# Patient Record
Sex: Male | Born: 1950 | Race: White | Hispanic: No | Smoking: Former smoker
Health system: Southern US, Community
[De-identification: ages and names within clinical notes are randomized; demographics above are authoritative.]

## PROBLEM LIST (undated history)

## (undated) DIAGNOSIS — R519 Headache, unspecified: Secondary | ICD-10-CM

## (undated) DIAGNOSIS — N400 Enlarged prostate without lower urinary tract symptoms: Secondary | ICD-10-CM

## (undated) DIAGNOSIS — K635 Polyp of colon: Secondary | ICD-10-CM

## (undated) DIAGNOSIS — J189 Pneumonia, unspecified organism: Secondary | ICD-10-CM

## (undated) DIAGNOSIS — I6381 Other cerebral infarction due to occlusion or stenosis of small artery: Secondary | ICD-10-CM

## (undated) DIAGNOSIS — G8929 Other chronic pain: Secondary | ICD-10-CM

## (undated) DIAGNOSIS — K439 Ventral hernia without obstruction or gangrene: Secondary | ICD-10-CM

## (undated) DIAGNOSIS — F0390 Unspecified dementia without behavioral disturbance: Secondary | ICD-10-CM

## (undated) DIAGNOSIS — R51 Headache: Secondary | ICD-10-CM

## (undated) DIAGNOSIS — M1990 Unspecified osteoarthritis, unspecified site: Secondary | ICD-10-CM

## (undated) DIAGNOSIS — E039 Hypothyroidism, unspecified: Secondary | ICD-10-CM

## (undated) DIAGNOSIS — K4090 Unilateral inguinal hernia, without obstruction or gangrene, not specified as recurrent: Secondary | ICD-10-CM

## (undated) DIAGNOSIS — G4730 Sleep apnea, unspecified: Secondary | ICD-10-CM

## (undated) DIAGNOSIS — K649 Unspecified hemorrhoids: Secondary | ICD-10-CM

## (undated) DIAGNOSIS — K56609 Unspecified intestinal obstruction, unspecified as to partial versus complete obstruction: Secondary | ICD-10-CM

## (undated) DIAGNOSIS — E119 Type 2 diabetes mellitus without complications: Secondary | ICD-10-CM

## (undated) DIAGNOSIS — K5790 Diverticulosis of intestine, part unspecified, without perforation or abscess without bleeding: Secondary | ICD-10-CM

## (undated) DIAGNOSIS — K529 Noninfective gastroenteritis and colitis, unspecified: Secondary | ICD-10-CM

## (undated) DIAGNOSIS — K5090 Crohn's disease, unspecified, without complications: Secondary | ICD-10-CM

## (undated) DIAGNOSIS — I10 Essential (primary) hypertension: Secondary | ICD-10-CM

## (undated) DIAGNOSIS — D649 Anemia, unspecified: Secondary | ICD-10-CM

## (undated) DIAGNOSIS — K8020 Calculus of gallbladder without cholecystitis without obstruction: Secondary | ICD-10-CM

## (undated) DIAGNOSIS — E669 Obesity, unspecified: Secondary | ICD-10-CM

## (undated) DIAGNOSIS — G4700 Insomnia, unspecified: Secondary | ICD-10-CM

## (undated) HISTORY — PX: OTHER SURGICAL HISTORY: SHX169

## (undated) HISTORY — PX: CHOLECYSTECTOMY: SHX55

## (undated) HISTORY — DX: Insomnia, unspecified: G47.00

## (undated) HISTORY — DX: Essential (primary) hypertension: I10

## (undated) HISTORY — DX: Headache: R51

## (undated) HISTORY — DX: Headache, unspecified: R51.9

## (undated) HISTORY — DX: Type 2 diabetes mellitus without complications: E11.9

## (undated) HISTORY — DX: Pneumonia, unspecified organism: J18.9

## (undated) HISTORY — DX: Obesity, unspecified: E66.9

## (undated) HISTORY — DX: Diverticulosis of intestine, part unspecified, without perforation or abscess without bleeding: K57.90

## (undated) HISTORY — DX: Anemia, unspecified: D64.9

## (undated) HISTORY — DX: Benign prostatic hyperplasia without lower urinary tract symptoms: N40.0

## (undated) HISTORY — DX: Hypothyroidism, unspecified: E03.9

## (undated) HISTORY — DX: Unilateral inguinal hernia, without obstruction or gangrene, not specified as recurrent: K40.90

## (undated) HISTORY — DX: Unspecified osteoarthritis, unspecified site: M19.90

## (undated) HISTORY — PX: NASAL SINUS SURGERY: SHX719

## (undated) HISTORY — DX: Unspecified hemorrhoids: K64.9

## (undated) HISTORY — DX: Other chronic pain: G89.29

## (undated) HISTORY — DX: Crohn's disease, unspecified, without complications: K50.90

## (undated) HISTORY — DX: Calculus of gallbladder without cholecystitis without obstruction: K80.20

## (undated) HISTORY — DX: Sleep apnea, unspecified: G47.30

## (undated) HISTORY — DX: Ventral hernia without obstruction or gangrene: K43.9

## (undated) HISTORY — DX: Other cerebral infarction due to occlusion or stenosis of small artery: I63.81

## (undated) HISTORY — DX: Noninfective gastroenteritis and colitis, unspecified: K52.9

## (undated) HISTORY — DX: Unspecified intestinal obstruction, unspecified as to partial versus complete obstruction: K56.609

## (undated) HISTORY — PX: TONSILLECTOMY AND ADENOIDECTOMY: SUR1326

## (undated) HISTORY — DX: Polyp of colon: K63.5

---

## 2006-05-13 ENCOUNTER — Ambulatory Visit: Payer: Self-pay | Admitting: Cardiology

## 2006-05-22 ENCOUNTER — Ambulatory Visit: Payer: Self-pay | Admitting: Cardiology

## 2006-07-10 ENCOUNTER — Ambulatory Visit (HOSPITAL_COMMUNITY): Admission: RE | Admit: 2006-07-10 | Discharge: 2006-07-11 | Payer: Self-pay | Admitting: Otolaryngology

## 2006-10-29 ENCOUNTER — Ambulatory Visit (HOSPITAL_BASED_OUTPATIENT_CLINIC_OR_DEPARTMENT_OTHER): Admission: RE | Admit: 2006-10-29 | Discharge: 2006-10-29 | Payer: Self-pay | Admitting: Otolaryngology

## 2006-11-04 ENCOUNTER — Ambulatory Visit: Payer: Self-pay | Admitting: Internal Medicine

## 2008-05-14 ENCOUNTER — Ambulatory Visit: Payer: Self-pay | Admitting: Cardiology

## 2010-04-10 ENCOUNTER — Ambulatory Visit (INDEPENDENT_AMBULATORY_CARE_PROVIDER_SITE_OTHER): Payer: BC Managed Care – PPO | Admitting: Internal Medicine

## 2010-04-10 DIAGNOSIS — K5900 Constipation, unspecified: Secondary | ICD-10-CM

## 2010-04-10 DIAGNOSIS — K5090 Crohn's disease, unspecified, without complications: Secondary | ICD-10-CM

## 2010-05-22 ENCOUNTER — Ambulatory Visit (INDEPENDENT_AMBULATORY_CARE_PROVIDER_SITE_OTHER): Payer: PRIVATE HEALTH INSURANCE | Admitting: Internal Medicine

## 2010-05-22 DIAGNOSIS — K5090 Crohn's disease, unspecified, without complications: Secondary | ICD-10-CM

## 2010-05-22 DIAGNOSIS — Z8711 Personal history of peptic ulcer disease: Secondary | ICD-10-CM

## 2010-05-23 ENCOUNTER — Ambulatory Visit (INDEPENDENT_AMBULATORY_CARE_PROVIDER_SITE_OTHER): Payer: PRIVATE HEALTH INSURANCE | Admitting: Internal Medicine
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
## 2010-07-18 NOTE — Procedures (Signed)
NAME:  Bruce [name], Bruce [name] NO.:  [id number]   MEDICAL RECORD NO.:  [id number]          PATIENT TYPE:  OUT   LOCATION:  SLEEP CENTER                 FACILITY:  [facility]   PHYSICIAN:  Clinton D. Annamaria Boots, MD, FCCP, [place] OF BIRTH:  22-Jun-1950   DATE OF STUDY:  10/29/2006                            NOCTURNAL POLYSOMNOGRAM   REFERRING PHYSICIAN:   REFERRING PHYSICIAN:  Shanon Brow L. Wilburn Cornelia, M.D.   INDICATION FOR STUDY:  Hypersomnia with sleep apnea.   EPWORTH SLEEPINESS SCORE:  16/24.  BMI 31.  Weight 230 pounds.   HOME MEDICATIONS:  Listed and reviewed.   SLEEP ARCHITECTURE:  Total sleep time 395 with sleep efficiency 92%.  Stage I 5%.  Stage II 90%.  Stage III absent.  REM 6% of total sleep  time.  Sleep latency 13 minutes.  REM latency 105 minutes.  Awake after  sleep onset 21 minutes.  Arousal index 10.6.  Zolpidem and alprazolam  were both taken at 9:45 p.m.   RESPIRATORY DATA:  Split study protocol.  Apnea-hypopnea index (AHI/RDI)  36.8 obstructive events per hour, indicating moderate obstructive sleep  apnea/hypopnea syndrome before CPAP.  This reflected 85 events, all  hypopneas, before CPAP.  The events were noted especially while supine  (AHI 23.4) but were also present in other sleep positions.  REM AHI 16  per hour.  CPAP was titrated to 11 CWP, AIH 0 per hour.  A large ResMed  Quattro full face mask was chosen with heated humidifier.   OXYGEN DATA:  Moderate snoring with oxygen desaturation nadir 84% before  CPAP.  After CPAP control, saturation held 93-96% on room air.   CARDIAC DATA:  Sinus rhythm with PVCs.   MOVEMENT-PARASOMNIA:  A total of 76 limb jerks were recorded, of which 8  were associated with arousal or awakening for a periodic limb movement  with arousal index of 1.2 per hour, which is probably not significant.   IMPRESSIONS-RECOMMENDATIONS:  1. Adequate sleep time with diminished percentage time in REM after      taking both Zolpidem and  alprazolam at bedtime.  2. Moderate obstructive sleep apnea/hypopnea syndrome.  AHI 36 per      hour with events more common supine but not limited to supine      position.  Moderate snoring with oxygen desaturation nadir 84%.  3. Successful CPAP titration to 11 CWP, AIH 0 per hour.  A large      ResMed Quattro full face mask was chosen with heated humidifier.  4. Patient describes events of sudden waking with choking at home.      Differential may include reflux events with this description.      Clinton D. Annamaria Boots, MD, [facility], FACP  Diplomate, Tax adviser of Sleep Medicine  Electronically Signed     CDY/MEDQ  D:  11/03/2006 12:14:47  T:  11/03/2006 16:10:20  Job:  [id number]
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
## 2010-12-25 ENCOUNTER — Ambulatory Visit (INDEPENDENT_AMBULATORY_CARE_PROVIDER_SITE_OTHER): Payer: BC Managed Care – PPO | Admitting: Internal Medicine

## 2010-12-25 ENCOUNTER — Encounter (INDEPENDENT_AMBULATORY_CARE_PROVIDER_SITE_OTHER): Payer: Self-pay | Admitting: *Deleted

## 2010-12-25 ENCOUNTER — Telehealth (INDEPENDENT_AMBULATORY_CARE_PROVIDER_SITE_OTHER): Payer: Self-pay | Admitting: *Deleted

## 2010-12-25 ENCOUNTER — Other Ambulatory Visit (INDEPENDENT_AMBULATORY_CARE_PROVIDER_SITE_OTHER): Payer: Self-pay | Admitting: *Deleted

## 2010-12-25 ENCOUNTER — Encounter (INDEPENDENT_AMBULATORY_CARE_PROVIDER_SITE_OTHER): Payer: Self-pay | Admitting: Internal Medicine

## 2010-12-25 VITALS — BP 116/70 | HR 78 | Temp 98.1°F | Resp 16 | Ht 72.0 in | Wt 266.0 lb

## 2010-12-25 DIAGNOSIS — K921 Melena: Secondary | ICD-10-CM

## 2010-12-25 DIAGNOSIS — K5090 Crohn's disease, unspecified, without complications: Secondary | ICD-10-CM

## 2010-12-25 DIAGNOSIS — K5900 Constipation, unspecified: Secondary | ICD-10-CM

## 2010-12-25 DIAGNOSIS — K625 Hemorrhage of anus and rectum: Secondary | ICD-10-CM

## 2010-12-25 MED ORDER — PEG-KCL-NACL-NASULF-NA ASC-C 100 G PO SOLR: 1.0000 | Freq: Once | ORAL | Status: DC

## 2010-12-25 MED ORDER — POLYETHYLENE GLYCOL 3350 17 GM/SCOOP PO POWD: 17.0000 g | Freq: Every day | ORAL | Status: AC

## 2010-12-25 NOTE — Telephone Encounter (Signed)
Patient needs movi prep 

## 2010-12-25 NOTE — Patient Instructions (Signed)
Continue with high fiber diet and fiber pills as before. Take MiraLAX half to one scoop daily can titrate dose to desired result. Need to become more active and start walking daily. Colonoscopy to be scheduled.
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
## 2010-12-26 NOTE — Consult Note (Signed)
NAME:  Bruce [name] NO.:  [id number]  MEDICAL RECORD NO.:  [id number]  LOCATION:                                 FACILITY:  PHYSICIAN:  Hildred Laser, M.D.    DATE OF BIRTH:  Oct 30, 1950  DATE OF CONSULTATION: DATE OF DISCHARGE:                                CONSULTATION   PRESENTING COMPLAINT:  Rectal bleeding.  Patient with history of small bowel Crohn disease.  HISTORY OF PRESENT ILLNESS:  Bruce [name] is 60 year old Caucasian male patient of Dr. Woody Seller who is here for scheduled visit.  He was last seen in February 2012, and was complaining of abdominal pain and constipation and was begun on MiraLax.  He stopped MiraLax after a while.  The patient is accompanied by his wife, Bruce [name].  The patient states he has been seeing blood with his bowel movements for about 3 months.  He sees it almost every time he has a bowel movement.  His bowels move every 3-4 days and he is constipated, although he is not taking anything at the present time.  He denies abdominal pain.  He has a good appetite. He has gained 8 pounds since his last visit.  He does not do any physical activity.  He does complain of left ankle pain.  He says amount of blood that he passes is bright red and small-to-moderate amount.  Review of the systems is negative for heartburn, dysphagia, nausea, vomiting, fever, or chills.  CURRENT MEDICATIONS: 1. Alprazolam 0.5 mg b.i.d. and 1 mg at bedtime. 2. Aspirin 81 mg p.o. daily. 3. Avodart 0.5 mg p.o. daily. 4. Fiber capsules 2 twice daily. 5. Levothyroxine 175 mcg p.o. daily. 6. Lisinopril 10 mg p.o. daily. 7. Pentasa 1 g p.o. q.i.d. 8. Zolpidem 10 mg p.o. at bedtime.  PAST MEDICAL HISTORY: 1. Hypertension. 2. Hypothyroidism. 3. Benign prostatic hypertrophy. 4. Chronic insomnia. 5. Sleep apnea, but he could not tolerate CPAP. 6. History of Crohn disease, diagnosed in September 2009 when he had a     colonoscopy.  He had terminal ileitis with a  noncritical stricture.     Biopsy was consistent with his diagnosis.  He had 2 diverticula at     sigmoid colon, but no evidence of colonic disease. He also had small adenoma removed. 7. Obesity.  He has gained 26 pounds in the last 3 years.  He had injury to his face at age 26 and he had surgery on his right eye to help with closure of eyelids.  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  He was admitted to [facility] in September with chest pain and MI was ruled out.  He apparently had ileus or gastritis.  In August of this year, he was seen in emergency room with bilateral flank pain and unenhanced abdominopelvic CT was negative.  In February of this year, he was admitted to [facility] with small bowel obstruction felt to be secondary to adhesions and responded to conservative therapy.  ALLERGIES:  NK.  FAMILY HISTORY:  Mother is 39 year old and doing well.  He has been treated for colon carcinoma and remains in remission.  Sister had surgery for colon carcinoma  when she was in her early 14s and now she is 51 and in remission.  Two other sisters are in good health.  Father died of brain cancer at age 40.  SOCIAL HISTORY:  He is married.  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  He smoked cigarettes less than a pack a day for 40 years, but quit 6 years ago.  He does not drink alcohol.  He has a son in good health.  His one daughter has ileocolonic Crohn disease and other daughter died of auto accident at age 34.  OBJECTIVE:  VITAL SIGNS:  Weight 266 pounds, he is 72 inches tall, pulse 78 per minute, blood pressure 116/70, respirations 16, and temp is 98.1. EYES:  He has deformity to his right eyelid from previous surgeries closed laterally.  He has right facial weakness.   Conjunctivae is pink. Sclerae nonicteric. THROAT:  Oropharyngeal mucosa is normal.  No neck masses or thyromegaly noted. CARDIAC:  With regular rhythm.  Normal S1, S2.  No murmur or gallop noted. LUNGS:  Clear to auscultation. ABDOMEN:  Protuberant.  Bowel sounds are normal.  On palpation, is soft and nontender without organomegaly or masses. RECTAL:  Deferred. EXTREMITIES:  He does not have peripheral edema or clubbing.  LABORATORY DATA:  CRP from October 31, 2010, was 1.08 and on Jul 17, 2010 was 0.44.  ASSESSMENT:  Bruce [name] is 60 year old Caucasian male with small bowel Crohn disease who presents with constipation, recurrent hematochezia, and mildly elevated CRP.  His last colonoscopy was in September 2009 when he was found to have ileitis, few diverticula at sigmoid colon, and he also  had small adenoma removed from splenic flexure. Family history is significant for colon carcinoma in mother and sister.  I doubt that his Crohn's is active, but his CRP is mildly elevated. Therefore, he needs to be re-evaluated.  RECOMMENDATIONS: 1. The patient advised to increase physical activity.  He must try to     lose some weight. 2. He will continue high-fiber diet and fiber capsules and start     MiraLax half to 1 scoop daily. 3. Diagnostic colonoscopy to be performed in near future.  I have     reviewed the procedure risks with the patient and he is agreeable.  We appreciate the opportunity to participate in the care of this gentleman.          ______________________________ Hildred Laser, M.D.     NR/MEDQ  D:  12/25/2010  T:  12/26/2010  Job:  [id number]  cc:   Dr. Woody Seller

## 2011-01-10 NOTE — Progress Notes (Signed)
PRESENTING COMPLAINT:  Rectal bleeding.  Patient with history of small   bowel Crohn disease.      HISTORY OF PRESENT ILLNESS:  Bruce [name] is 60 year old Caucasian male patient   of Dr. Woody Seller who is here for scheduled visit.  He was last seen in   February 2012, and was complaining of abdominal pain and constipation   and was begun on MiraLax.  He stopped MiraLax after a while.      The patient is accompanied by his wife, Manuela Schwartz.  The patient states he   has been seeing blood with his bowel movements for about 3 months.  He   sees it almost every time he has a bowel movement.  His bowels move   every 3-4 days and he is constipated, although he is not taking anything   at the present time.  He denies abdominal pain.  He has a good appetite.   He has gained 8 pounds since his last visit.  He does not do any   physical activity.  He does complain of left ankle pain.  He says amount   of blood that he passes is bright red and small-to-moderate amount.      Review of the systems is negative for heartburn, dysphagia, nausea,   vomiting, fever, or chills.      CURRENT MEDICATIONS:   1. Alprazolam 0.5 mg b.i.d. and 1 mg at bedtime.   2. Aspirin 81 mg p.o. daily.   3. Avodart 0.5 mg p.o. daily.   4. Fiber capsules 2 twice daily.   5. Levothyroxine 175 mcg p.o. daily.   6. Lisinopril 10 mg p.o. daily.   7. Pentasa 1 g p.o. q.i.d.   8. Zolpidem 10 mg p.o. at bedtime.      PAST MEDICAL HISTORY:   1. Hypertension.   2. Hypothyroidism.   3. Benign prostatic hypertrophy.   4. Chronic insomnia.   5. Sleep apnea, but he could not tolerate CPAP.   6. History of Crohn disease, diagnosed in September 2009 when he had a       colonoscopy.  He had terminal ileitis with a noncritical stricture.       Biopsy was consistent with his diagnosis.  He had 2 diverticula at       sigmoid colon, but no evidence of colonic disease.   7. Obesity.  He has gained 26 pounds in the last 3 years.      He had injury to his  face at age 18 and he had surgery on his right eye   to help with closure of eyelids.  He has had right facial weakness   thought to be due to nerve injury.  He fell at age 70 and had splenic   injury leading to splenectomy.   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      He was admitted to [facility] in September with chest pain and MI   was ruled out.  He apparently had ileus or gastritis.  In August of this   year, he was seen in emergency room with bilateral flank pain and   unenhanced abdominopelvic CT was negative.      In February of this year, he was admitted to [facility] with small bowel   obstruction felt to be secondary to adhesions and responded to   conservative therapy.      ALLERGIES:  NK.  FAMILY HISTORY:  Mother is 40 year old and doing well.  He has been   treated for colon carcinoma and remains in remission.  Sister had   surgery for colon carcinoma  when she was in her early 12s and now she   is 47 and in remission.  Two other sisters are in good health.  Father   died of brain cancer at age 28.      SOCIAL HISTORY:  He is married.  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  He smoked cigarettes less than a pack a day for 40   years, but quit 6 years ago.  He does not drink alcohol.  He has a son   in good health.  His one daughter has ileocolonic Crohn disease and   other daughter died of auto accident at age 62.      OBJECTIVE:  VITAL SIGNS:  Weight 266 pounds, he is 72 inches tall, pulse   78 per minute, blood pressure 116/70, respirations 16, and temp is 98.1.   EYES:  He has deformity to his right eyelid from previous surgeries   closed laterally.  He has right facial weakness.  Conjunctivae is pink.   Sclerae nonicteric.   THROAT:  Oropharyngeal mucosa is normal.  No neck masses or thyromegaly   noted.   CARDIAC:  With regular rhythm.  Normal S1, S2.  No murmur or gallop   noted.     LUNGS:  Clear to auscultation.   ABDOMEN:  Protuberant.  Bowel sounds are normal.  On palpation, is soft   and nontender without organomegaly or masses.   RECTAL:  Deferred.   EXTREMITIES:  He does not have peripheral edema or clubbing.      LABORATORY DATA:  CRP from October 31, 2010, was 1.08 and on Jul 17, 2010   was 0.44.      ASSESSMENT:  Bruce [name] is 60 year old Caucasian male with small bowel Crohn   disease who presents with constipation, recurrent hematochezia, and   mildly elevated CRP.  His last colonoscopy was in September 2009 when he   was found to have ileitis, few diverticula at sigmoid colon, and he also   had small adenoma removed from splenic flexure, not mentioned above.   Family history significant for colon carcinoma in mother and sister.  I   doubt that his Crohn's is active, but his CRP is mildly elevated.   Therefore, he needs to be re-evaluated.      RECOMMENDATIONS:   1. The patient advised to increase physical activity.  He must try to       lose some weight.   2. He will continue high-fiber diet and fiber capsules and start       MiraLax half to 1 scoop daily.   3. A diagnostic colonoscopy to be performed in near future.  I have       reviewed the procedure risks with the patient and he is agreeable.      We appreciate the opportunity to participate in the care of this   gentleman.

## 2011-01-17 ENCOUNTER — Encounter (HOSPITAL_COMMUNITY): Payer: Self-pay | Admitting: Pharmacy Technician

## 2011-01-18 MED ORDER — SODIUM CHLORIDE 0.45 % IV SOLN
Freq: Once | INTRAVENOUS | Status: AC
  Administered 2011-01-19: 1000 mL via INTRAVENOUS

## 2011-01-19 ENCOUNTER — Encounter (HOSPITAL_COMMUNITY): Payer: Self-pay | Admitting: *Deleted

## 2011-01-19 ENCOUNTER — Ambulatory Visit (HOSPITAL_COMMUNITY)
Admission: RE | Admit: 2011-01-19 | Discharge: 2011-01-19 | Disposition: A | Discharge status: Home / Self Care | Payer: BC Managed Care – PPO | Source: Ambulatory Visit | Attending: Internal Medicine | Admitting: Internal Medicine

## 2011-01-19 ENCOUNTER — Encounter (HOSPITAL_COMMUNITY)
Admission: RE | Disposition: A | Discharge status: Home / Self Care | Payer: Self-pay | Source: Ambulatory Visit | Attending: Internal Medicine

## 2011-01-19 ENCOUNTER — Other Ambulatory Visit (INDEPENDENT_AMBULATORY_CARE_PROVIDER_SITE_OTHER): Payer: Self-pay | Admitting: Internal Medicine

## 2011-01-19 DIAGNOSIS — K921 Melena: Secondary | ICD-10-CM | POA: Insufficient documentation

## 2011-01-19 DIAGNOSIS — I10 Essential (primary) hypertension: Secondary | ICD-10-CM | POA: Insufficient documentation

## 2011-01-19 DIAGNOSIS — K5730 Diverticulosis of large intestine without perforation or abscess without bleeding: Secondary | ICD-10-CM

## 2011-01-19 DIAGNOSIS — Z8719 Personal history of other diseases of the digestive system: Secondary | ICD-10-CM | POA: Insufficient documentation

## 2011-01-19 DIAGNOSIS — K5289 Other specified noninfective gastroenteritis and colitis: Secondary | ICD-10-CM

## 2011-01-19 DIAGNOSIS — Z79899 Other long term (current) drug therapy: Secondary | ICD-10-CM | POA: Insufficient documentation

## 2011-01-19 DIAGNOSIS — K644 Residual hemorrhoidal skin tags: Secondary | ICD-10-CM | POA: Insufficient documentation

## 2011-01-19 DIAGNOSIS — D126 Benign neoplasm of colon, unspecified: Secondary | ICD-10-CM

## 2011-01-19 DIAGNOSIS — K5909 Other constipation: Secondary | ICD-10-CM | POA: Insufficient documentation

## 2011-01-19 DIAGNOSIS — Z7982 Long term (current) use of aspirin: Secondary | ICD-10-CM | POA: Insufficient documentation

## 2011-01-19 DIAGNOSIS — K633 Ulcer of intestine: Secondary | ICD-10-CM | POA: Insufficient documentation

## 2011-01-19 DIAGNOSIS — K5090 Crohn's disease, unspecified, without complications: Secondary | ICD-10-CM

## 2011-01-19 DIAGNOSIS — G4733 Obstructive sleep apnea (adult) (pediatric): Secondary | ICD-10-CM | POA: Insufficient documentation

## 2011-01-19 DIAGNOSIS — K625 Hemorrhage of anus and rectum: Secondary | ICD-10-CM

## 2011-01-19 HISTORY — PX: COLONOSCOPY: SHX5424

## 2011-01-19 SURGERY — COLONOSCOPY
Anesthesia: Moderate Sedation

## 2011-01-19 MED ORDER — MEPERIDINE HCL 50 MG/ML IJ SOLN
INTRAMUSCULAR | Status: DC | PRN
  Administered 2011-01-19 (×2): 25 mg via INTRAVENOUS

## 2011-01-19 MED ORDER — MIDAZOLAM HCL 5 MG/5ML IJ SOLN
INTRAMUSCULAR | Status: AC
  Filled 2011-01-19: qty 10

## 2011-01-19 MED ORDER — STERILE WATER FOR IRRIGATION IR SOLN
Status: DC | PRN
  Administered 2011-01-19: 10:00:00

## 2011-01-19 MED ORDER — MEPERIDINE HCL 50 MG/ML IJ SOLN
INTRAMUSCULAR | Status: AC
  Filled 2011-01-19: qty 1

## 2011-01-19 MED ORDER — MIDAZOLAM HCL 5 MG/5ML IJ SOLN
INTRAMUSCULAR | Status: DC | PRN
  Administered 2011-01-19 (×4): 2 mg via INTRAVENOUS

## 2011-01-19 NOTE — H&P (Signed)
This is an update to my history and physical from 12/25/2010. Patient's condition her medications not changed. He states he is starting more active physically.  He has not experienced any more rectal bleeding. He is here for diagnostic colonoscopy.

## 2011-01-19 NOTE — Op Note (Signed)
COLONOSCOPY PROCEDURE REPORT  PATIENT:  Bruce [name]  MR#:  [id number] Birthdate:  30-Aug-1950, 60 y.o., male Endoscopist:  Dr. Rogene Houston, MD Referred By:  Dr. Glenda Chroman,  M.D. Procedure Date: 01/19/2011  Procedure:   Colonoscopy  Indications:  Patient is 60 year old Caucasian male with history of ileal Crohn's disease who presents with intermittent hematochezia and constipation. He is undergoing diagnostic colonoscopy. His last exam was in September 2009 details of which are in my history and physical from 12/25/2010.  Informed Consent: Seizure and risks were reviewed with the patient and informed consent was obtained. Medications:  Demerol 50 mg IV Versed 8 mg IV  Description of procedure:  After a digital rectal exam was performed, that colonoscope was advanced from the anus through the rectum and colon to the area of the cecum, ileocecal valve and appendiceal orifice. The cecum was deeply intubated. These structures were well-seen and photographed for the record. From the level of the cecum and ileocecal valve, the scope was slowly and cautiously withdrawn. The mucosal surfaces were carefully surveyed utilizing scope tip to flexion to facilitate fold flattening as needed. The scope was pulled down into the rectum where a thorough exam including retroflexion was performed.  Findings:   Prep excellent. Few ileal erosions and a 3 mm ulcer involving terminal ileum. Noncritical narrowing right at ileocecal valve unchanged from previous exam. 4 mm polyp ablated via cold biopsy from sigmoid colon. Few diverticula at sigmoid colon. Small hemorrhoids below the dentate line.  Therapeutic/Diagnostic Maneuvers Performed:  See above  Complications:  None  Cecal Withdrawal Time:  17 minutes  Impression:  Few erosions and a single small ulcer involving terminal ileum and noncritical narrowing at ileocecal junction. This narrowing is unchanged from his last exam of September 2009 but  ileitis is less pronounced. These findings are secondary to known ileal Crohn's disease. 4 mm  polyp ablated via cold biopsy from sigmoid colon. Few diverticula at sigmoid colon. External  hemorrhoids felt to be source of patient's hematochezia  Recommendations:  Standard instructions given. I will be contacting patient with results of biopsy.  REHMAN,NAJEEB U  01/19/2011 10:20 AM  CC: Dr. Glenda Chroman., MD, MD & Dr. Rayne Du ref. provider found

## 2011-01-22 LAB — HM COLONOSCOPY

## 2011-01-23 ENCOUNTER — Encounter (INDEPENDENT_AMBULATORY_CARE_PROVIDER_SITE_OTHER): Payer: Self-pay | Admitting: *Deleted

## 2011-01-29 ENCOUNTER — Encounter (HOSPITAL_COMMUNITY): Payer: Self-pay | Admitting: Internal Medicine

## 2012-01-09 ENCOUNTER — Encounter (INDEPENDENT_AMBULATORY_CARE_PROVIDER_SITE_OTHER): Payer: Self-pay | Admitting: *Deleted

## 2012-03-17 ENCOUNTER — Ambulatory Visit (INDEPENDENT_AMBULATORY_CARE_PROVIDER_SITE_OTHER): Payer: BC Managed Care – PPO | Admitting: Internal Medicine

## 2012-03-17 ENCOUNTER — Encounter (INDEPENDENT_AMBULATORY_CARE_PROVIDER_SITE_OTHER): Payer: Self-pay | Admitting: Internal Medicine

## 2012-03-17 VITALS — BP 110/70 | HR 76 | Temp 98.0°F | Resp 20 | Ht 72.0 in | Wt 257.1 lb

## 2012-03-17 DIAGNOSIS — K5000 Crohn's disease of small intestine without complications: Secondary | ICD-10-CM | POA: Insufficient documentation

## 2012-03-17 DIAGNOSIS — I10 Essential (primary) hypertension: Secondary | ICD-10-CM | POA: Insufficient documentation

## 2012-03-17 DIAGNOSIS — R351 Nocturia: Secondary | ICD-10-CM | POA: Insufficient documentation

## 2012-03-17 DIAGNOSIS — N401 Enlarged prostate with lower urinary tract symptoms: Secondary | ICD-10-CM | POA: Insufficient documentation

## 2012-03-17 DIAGNOSIS — K5090 Crohn's disease, unspecified, without complications: Secondary | ICD-10-CM

## 2012-03-17 DIAGNOSIS — K5900 Constipation, unspecified: Secondary | ICD-10-CM

## 2012-03-17 DIAGNOSIS — N138 Other obstructive and reflux uropathy: Secondary | ICD-10-CM | POA: Insufficient documentation

## 2012-03-17 DIAGNOSIS — E669 Obesity, unspecified: Secondary | ICD-10-CM

## 2012-03-17 DIAGNOSIS — E039 Hypothyroidism, unspecified: Secondary | ICD-10-CM | POA: Insufficient documentation

## 2012-03-17 DIAGNOSIS — G4730 Sleep apnea, unspecified: Secondary | ICD-10-CM | POA: Insufficient documentation

## 2012-03-17 HISTORY — DX: Benign prostatic hyperplasia with lower urinary tract symptoms: N40.1

## 2012-03-17 HISTORY — DX: Crohn's disease of small intestine without complications: K50.00

## 2012-03-17 HISTORY — DX: Essential (primary) hypertension: I10

## 2012-03-17 NOTE — Progress Notes (Signed)
Presenting complaint;  Followup for Crohn's disease and constipation.  Subjective:  Bruce [name] a 62 year old Caucasian male who is here for scheduled visit. He was last seen in March 2013. His last colonoscopy was in November 2012 revealing ileitis and noncritical stricture at ileocecal valve. He also had few diverticula at sigmoid colon and external hemorrhoids.  he has very good appetite. He believes he has gained few pounds over the holidays. He has lost 9 pounds since October 2012 but none since his last visit. His bowels move daily. He rarely has formed stool. He generally passes bits and pieces. If he stops taking polyethylene glycol he becomes constipated. He denies melena or rectal bleeding. Only time he has lower abdominal pain as many overeats and then he has to drink coffee or juice and as well as his bowels move he feels better.  Current Medications: Current Outpatient Prescriptions  Medication Sig Dispense Refill  . aspirin 81 MG tablet Take 81 mg by mouth daily.        Marland Kitchen dutasteride (AVODART) 0.5 MG capsule Take 0.5 mg by mouth daily.        Marland Kitchen levothyroxine (SYNTHROID, LEVOTHROID) 175 MCG tablet Take 175 mcg by mouth daily.        Marland Kitchen lisinopril (PRINIVIL,ZESTRIL) 10 MG tablet Take 10 mg by mouth daily.        . mesalamine (PENTASA) 500 MG CR capsule Take 500 mg by mouth. Patient takes 2 capsules four times a day       . polyethylene glycol (MIRALAX / GLYCOLAX) packet Take 17 g by mouth daily.           Objective: Blood pressure 110/70, pulse 76, temperature 98 F (36.7 C), temperature source Oral, resp. rate 20, height 6' (1.829 m), weight 257 lb 1.6 oz (116.62 kg). Patient is alert and in no acute distress. He has hearing impairment. Conjunctiva is pink. Sclera is nonicteric Oropharyngeal mucosa is normal. No neck masses or thyromegaly noted. Cardiac exam with regular rhythm normal S1 and S2. No murmur or gallop noted. Lungs are clear to auscultation. Abdomen is full. Bowel  sounds are normal. On palpation soft abdomen with mild tenderness at LLQ and hypogastric area but no organomegaly or masses noted.  No LE edema or clubbing noted.   Assessment:  #1. Ileal Crohn's disease. He appears to be in remission based on his symptoms or lack there of. Unless he has a relapse he will stay on Pentasa. #2. Chronic constipation. He appears to be tolerating polyethylene glycol without any side effects. #3. Obesity. He needs to change his eating habits and avoid  snacks and avoid eating large meals.    Plan:  Continue Pentasa and polyethylene glycol at current dose. Patient will try to get [place] a copy of his blood that he would have at the time of his physical exam. Weight check in 4 months. Office visit in one year.

## 2012-03-17 NOTE — Patient Instructions (Signed)
Weight check in 4 months. Office visit in one year. Please notify of any have blood work at the time of your physical exam

## 2012-03-20 LAB — HM DIABETES EYE EXAM: HM Diabetic Eye Exam: NORMAL

## 2012-04-03 ENCOUNTER — Encounter: Payer: Self-pay | Admitting: Internal Medicine

## 2012-04-03 ENCOUNTER — Ambulatory Visit (INDEPENDENT_AMBULATORY_CARE_PROVIDER_SITE_OTHER): Payer: BC Managed Care – PPO | Admitting: Internal Medicine

## 2012-04-03 ENCOUNTER — Other Ambulatory Visit (INDEPENDENT_AMBULATORY_CARE_PROVIDER_SITE_OTHER): Payer: BC Managed Care – PPO

## 2012-04-03 VITALS — BP 118/84 | HR 90 | Temp 97.8°F | Resp 16 | Ht 72.0 in | Wt 265.0 lb

## 2012-04-03 DIAGNOSIS — E669 Obesity, unspecified: Secondary | ICD-10-CM

## 2012-04-03 DIAGNOSIS — Z0000 Encounter for general adult medical examination without abnormal findings: Secondary | ICD-10-CM

## 2012-04-03 DIAGNOSIS — Z23 Encounter for immunization: Secondary | ICD-10-CM

## 2012-04-03 DIAGNOSIS — N400 Enlarged prostate without lower urinary tract symptoms: Secondary | ICD-10-CM

## 2012-04-03 DIAGNOSIS — G4730 Sleep apnea, unspecified: Secondary | ICD-10-CM

## 2012-04-03 DIAGNOSIS — I10 Essential (primary) hypertension: Secondary | ICD-10-CM

## 2012-04-03 DIAGNOSIS — E039 Hypothyroidism, unspecified: Secondary | ICD-10-CM

## 2012-04-03 DIAGNOSIS — H9190 Unspecified hearing loss, unspecified ear: Secondary | ICD-10-CM | POA: Insufficient documentation

## 2012-04-03 HISTORY — DX: Unspecified hearing loss, unspecified ear: H91.90

## 2012-04-03 LAB — COMPREHENSIVE METABOLIC PANEL
ALT: 24 U/L (ref 0–53)
AST: 27 U/L (ref 0–37)
Albumin: 3.5 g/dL (ref 3.5–5.2)
Alkaline Phosphatase: 84 U/L (ref 39–117)
BUN: 15 mg/dL (ref 6–23)
CO2: 25 mEq/L (ref 19–32)
Calcium: 8.8 mg/dL (ref 8.4–10.5)
Chloride: 104 mEq/L (ref 96–112)
Creatinine, Ser: 1.2 mg/dL (ref 0.4–1.5)
GFR: 65.65 mL/min (ref >60.00)
Glucose, Bld: 99 mg/dL (ref 70–99)
Potassium: 4 mEq/L (ref 3.5–5.1)
Sodium: 136 mEq/L (ref 135–145)
Total Bilirubin: 0.5 mg/dL (ref 0.3–1.2)
Total Protein: 7.3 g/dL (ref 6.0–8.3)

## 2012-04-03 LAB — LIPID PANEL
Cholesterol: 160 mg/dL (ref 0–200)
HDL: 33.9 mg/dL — ABNORMAL LOW (ref >39.00)
LDL Cholesterol: 95 mg/dL (ref 0–99)
Total CHOL/HDL Ratio: 5
Triglycerides: 158 mg/dL — ABNORMAL HIGH (ref 0.0–149.0)
VLDL: 31.6 mg/dL (ref 0.0–40.0)

## 2012-04-03 LAB — URINALYSIS, ROUTINE W REFLEX MICROSCOPIC
Bilirubin Urine: NEGATIVE
Ketones, ur: NEGATIVE
Leukocytes, UA: NEGATIVE
Nitrite: NEGATIVE
Specific Gravity, Urine: 1.02 (ref 1.000–1.030)
Urine Glucose: NEGATIVE
Urobilinogen, UA: 0.2 (ref 0.0–1.0)
pH: 6 (ref 5.0–8.0)

## 2012-04-03 LAB — CBC WITH DIFFERENTIAL/PLATELET
Basophils Absolute: 0 10*3/uL (ref 0.0–0.1)
Basophils Relative: 0.7 % (ref 0.0–3.0)
Eosinophils Absolute: 0.3 10*3/uL (ref 0.0–0.7)
Eosinophils Relative: 3.5 % (ref 0.0–5.0)
HCT: 41.6 % (ref 39.0–52.0)
Hemoglobin: 13.8 g/dL (ref 13.0–17.0)
Lymphocytes Relative: 25 % (ref 12.0–46.0)
Lymphs Abs: 1.8 10*3/uL (ref 0.7–4.0)
MCHC: 33.1 g/dL (ref 30.0–36.0)
MCV: 85.4 fl (ref 78.0–100.0)
Monocytes Absolute: 0.8 10*3/uL (ref 0.1–1.0)
Monocytes Relative: 11 % (ref 3.0–12.0)
Neutro Abs: 4.4 10*3/uL (ref 1.4–7.7)
Neutrophils Relative %: 59.8 % (ref 43.0–77.0)
Platelets: 414 10*3/uL — ABNORMAL HIGH (ref 150.0–400.0)
RBC: 4.87 Mil/uL (ref 4.22–5.81)
RDW: 15.7 % — ABNORMAL HIGH (ref 11.5–14.6)
WBC: 7.4 10*3/uL (ref 4.5–10.5)

## 2012-04-03 LAB — TSH: TSH: 1.98 u[IU]/mL (ref 0.35–5.50)

## 2012-04-03 LAB — HEMOGLOBIN A1C: Hgb A1c MFr Bld: 6.8 % — ABNORMAL HIGH (ref 4.6–6.5)

## 2012-04-03 NOTE — Assessment & Plan Note (Signed)
Audiology referral

## 2012-04-03 NOTE — Assessment & Plan Note (Signed)
I will check his labs to look for secondary causes and end organ damage

## 2012-04-03 NOTE — Assessment & Plan Note (Signed)
His BP is well controlled Today I will check his lytes and renal function 

## 2012-04-03 NOTE — Patient Instructions (Addendum)
Health Maintenance, Males A healthy lifestyle and preventative care can promote health and wellness.  Maintain regular health, dental, and eye exams.  Eat a healthy diet. Foods like vegetables, fruits, whole grains, low-fat dairy products, and lean protein foods contain the nutrients you need without too many calories. Decrease your intake of foods high in solid fats, added sugars, and salt. Get information about a proper diet from your caregiver, if necessary.  Regular physical exercise is one of the most important things you can do for your health. Most adults should get at least 150 minutes of moderate-intensity exercise (any activity that increases your heart rate and causes you to sweat) each week. In addition, most adults need muscle-strengthening exercises on 2 or more days a week.   Maintain a healthy weight. The body mass index (BMI) is a screening tool to identify possible weight problems. It provides an estimate of body fat based on height and weight. Your caregiver can help determine your BMI, and can help you achieve or maintain a healthy weight. For adults 20 years and older:  A BMI below 18.5 is considered underweight.  A BMI of 18.5 to 24.9 is normal.  A BMI of 25 to 29.9 is considered overweight.  A BMI of 30 and above is considered obese.  Maintain normal blood lipids and cholesterol by exercising and minimizing your intake of saturated fat. Eat a balanced diet with plenty of fruits and vegetables. Blood tests for lipids and cholesterol should begin at age 20 and be repeated every 5 years. If your lipid or cholesterol levels are high, you are over 50, or you are a high risk for heart disease, you may need your cholesterol levels checked more frequently.Ongoing high lipid and cholesterol levels should be treated with medicines, if diet and exercise are not effective.  If you smoke, find out from your caregiver how to quit. If you do not use tobacco, do not start.  If you  choose to drink alcohol, do not exceed 2 drinks per day. One drink is considered to be 12 ounces (355 mL) of beer, 5 ounces (148 mL) of wine, or 1.5 ounces (44 mL) of liquor.  Avoid use of street drugs. Do not share needles with anyone. Ask for help if you need support or instructions about stopping the use of drugs.  High blood pressure causes heart disease and increases the risk of stroke. Blood pressure should be checked at least every 1 to 2 years. Ongoing high blood pressure should be treated with medicines if weight loss and exercise are not effective.  If you are 45 to 63 years old, ask your caregiver if you should take aspirin to prevent heart disease.  Diabetes screening involves taking a blood sample to check your fasting blood sugar level. This should be done once every 3 years, after age 45, if you are within normal weight and without risk factors for diabetes. Testing should be considered at a younger age or be carried out more frequently if you are overweight and have at least 1 risk factor for diabetes.  Colorectal cancer can be detected and often prevented. Most routine colorectal cancer screening begins at the age of 50 and continues through age 75. However, your caregiver may recommend screening at an earlier age if you have risk factors for colon cancer. On a yearly basis, your caregiver may provide home test kits to check for hidden blood in the stool. Use of a small camera at the end of a tube,   to directly examine the colon (sigmoidoscopy or colonoscopy), can detect the earliest forms of colorectal cancer. Talk to your caregiver about this at age 50, when routine screening begins. Direct examination of the colon should be repeated every 5 to 10 years through age 75, unless early forms of pre-cancerous polyps or small growths are found.  Hepatitis C blood testing is recommended for all people born from 1945 through 1965 and any individual with known risks for hepatitis C.  Healthy  men should no longer receive prostate-specific antigen (PSA) blood tests as part of routine cancer screening. Consult with your caregiver about prostate cancer screening.  Testicular cancer screening is not recommended for adolescents or adult males who have no symptoms. Screening includes self-exam, caregiver exam, and other screening tests. Consult with your caregiver about any symptoms you have or any concerns you have about testicular cancer.  Practice safe sex. Use condoms and avoid high-risk sexual practices to reduce the spread of sexually transmitted infections (STIs).  Use sunscreen with a sun protection factor (SPF) of 30 or greater. Apply sunscreen liberally and repeatedly throughout the day. You should seek shade when your shadow is shorter than you. Protect yourself by wearing long sleeves, pants, a wide-brimmed hat, and sunglasses year round, whenever you are outdoors.  Notify your caregiver of new moles or changes in moles, especially if there is a change in shape or color. Also notify your caregiver if a mole is larger than the size of a pencil eraser.  A one-time screening for abdominal aortic aneurysm (AAA) and surgical repair of large AAAs by sound wave imaging (ultrasonography) is recommended for ages 65 to 75 years who are current or former smokers.  Stay current with your immunizations. Document Released: 08/18/2007 Document Revised: 05/14/2011 Document Reviewed: 07/17/2010 [employer] Patient Information 2013 [employer].  

## 2012-04-03 NOTE — Assessment & Plan Note (Signed)
He tells me that his PSA was about 2 earlier this month

## 2012-04-03 NOTE — Assessment & Plan Note (Signed)
Sleep med referral

## 2012-04-03 NOTE — Progress Notes (Signed)
Subjective:    Patient ID: Bruce [name], male    DOB: Sep 25, 1950, 62 y.o.   MRN: [id number]  Hypertension This is a chronic problem. The current episode started more than 1 year ago. The problem is unchanged. The problem is controlled. Associated symptoms include malaise/fatigue. Pertinent negatives include no anxiety, blurred vision, chest pain, headaches, neck pain, orthopnea, palpitations, peripheral edema, PND, shortness of breath or sweats. Past treatments include ACE inhibitors. The current treatment provides moderate improvement. Compliance problems include exercise and diet.  Hypertensive end-organ damage includes a thyroid problem. Identifiable causes of hypertension include sleep apnea.      Review of Systems  Constitutional: Positive for malaise/fatigue, fatigue and unexpected weight change (weight gain). Negative for fever, chills, diaphoresis and appetite change.  HENT: Positive for hearing loss. Negative for ear pain, nosebleeds, congestion, sore throat, facial swelling, rhinorrhea, sneezing, mouth sores, trouble swallowing, neck pain, dental problem, voice change, postnasal drip, sinus pressure and tinnitus.   Eyes: Negative.  Negative for blurred vision.  Respiratory: Positive for apnea. Negative for cough, choking, chest tightness, shortness of breath, wheezing and stridor.   Cardiovascular: Negative.  Negative for chest pain, palpitations, orthopnea, leg swelling and PND.  Gastrointestinal: Negative.  Negative for nausea, vomiting, abdominal pain, diarrhea, constipation and blood in stool.  Genitourinary: Negative for dysuria, urgency, frequency, hematuria, flank pain, decreased urine volume, discharge, penile swelling, scrotal swelling, enuresis, difficulty urinating, genital sores, penile pain and testicular pain.  Musculoskeletal: Negative.  Negative for myalgias, back pain, joint swelling, arthralgias and gait problem.  Skin: Negative.  Negative for color change, pallor,  rash and wound.  Neurological: Negative for dizziness, syncope, speech difficulty, light-headedness, numbness and headaches.  Hematological: Negative.  Negative for adenopathy. Does not bruise/bleed easily.  Psychiatric/Behavioral: Negative.        Objective:   Physical Exam  Vitals reviewed. Constitutional: He appears well-developed and well-nourished. No distress.  HENT:  Head: Normocephalic and atraumatic.  Mouth/Throat: Oropharynx is clear and moist. No oropharyngeal exudate.  Eyes: Conjunctivae normal are normal. Right eye exhibits no discharge. Left eye exhibits no discharge. No scleral icterus.  Neck: Normal range of motion. Neck supple. No JVD present. No tracheal deviation present. No thyromegaly present.  Cardiovascular: Normal rate, normal heart sounds and intact distal pulses.  Exam reveals no gallop and no friction rub.   No murmur heard. Pulmonary/Chest: Effort normal and breath sounds normal. No stridor. No respiratory distress. He has no wheezes. He has no rales. He exhibits no tenderness.  Abdominal: Soft. Bowel sounds are normal. He exhibits no distension and no mass. There is no tenderness. There is no rebound and no guarding.  Genitourinary: Testes normal and penis normal. Right testis shows no mass, no swelling and no tenderness. Right testis is descended. Left testis shows no mass, no swelling and no tenderness. Left testis is descended. Circumcised. No phimosis, paraphimosis, hypospadias, penile erythema or penile tenderness. No discharge found.  Musculoskeletal: Normal range of motion. He exhibits no edema and no tenderness.  Lymphadenopathy:    He has no cervical adenopathy.  Neurological: He is alert. He has normal strength and normal reflexes. He displays no atrophy, no tremor and normal reflexes. A cranial nerve deficit (right facial palsy) is present. No sensory deficit. He exhibits normal muscle tone. He displays a negative Romberg sign. He displays no seizure  activity. Coordination and gait normal.  Skin: Skin is warm and dry. No rash noted. He is not diaphoretic. No erythema. No pallor.  Psychiatric:  He has a normal mood and affect. His behavior is normal. Judgment and thought content normal.     No results found for this basename: WBC, HGB, HCT, PLT, GLUCOSE, CHOL, TRIG, HDL, LDLDIRECT, LDLCALC, ALT, AST, NA, K, CL, CREATININE, BUN, CO2, TSH, PSA, INR, GLUF, HGBA1C, MICROALBUR       Assessment & Plan:

## 2012-04-03 NOTE — Assessment & Plan Note (Signed)
TSH today

## 2012-04-03 NOTE — Assessment & Plan Note (Signed)
Exam done Vaccines were updated Labs ordered Pt ed material was given

## 2012-04-09 ENCOUNTER — Ambulatory Visit (INDEPENDENT_AMBULATORY_CARE_PROVIDER_SITE_OTHER): Payer: BC Managed Care – PPO | Admitting: Pulmonary Disease

## 2012-04-09 ENCOUNTER — Encounter: Payer: Self-pay | Admitting: Pulmonary Disease

## 2012-04-09 DIAGNOSIS — G4730 Sleep apnea, unspecified: Secondary | ICD-10-CM

## 2012-04-09 NOTE — Assessment & Plan Note (Addendum)
I think CPAP remains the best option for him given his degree of sleep disorder breathing. Oral appliance would be suboptimal and is not a candidate for surgery. I think we should director efforts towards getting him a better fitting mask, protecting his high from the air leak from the CPAP mask and perhaps decreasing the cpap pressure to best decrease air leak  Go over to sleep lab for mask fit [id number] You may have to use eye patch We will get you started on CPAP machine with auto settings  Weight loss encouraged, compliance with goal of at least 4-6 hrs every night is the expectation. Advised against medications with sedative side effects Cautioned against driving when sleepy - understanding that sleepiness will vary on a day to day basis

## 2012-04-09 NOTE — Patient Instructions (Signed)
Go over to sleep lab for mask fit [id number] You may have to use eye patch We will get you started on CPAP machine with auto settings

## 2012-04-09 NOTE — Progress Notes (Signed)
  Subjective:    Patient ID: Bruce [name], male    DOB: 1950-07-25, 62 y.o.   MRN: [id number]  HPI    Review of Systems  Constitutional: Negative for appetite change and unexpected weight change.  HENT: Positive for dental problem. Negative for ear pain, congestion, sore throat, sneezing and trouble swallowing.   Respiratory: Positive for cough and shortness of breath.   Cardiovascular: Positive for leg swelling. Negative for chest pain and palpitations.  Gastrointestinal: Positive for abdominal pain.  Musculoskeletal: Negative for joint swelling.  Skin: Negative for rash.  Neurological: Negative for headaches.  Psychiatric/Behavioral: Negative for dysphoric mood. The patient is not nervous/anxious.        Objective:   Physical Exam        Assessment & Plan:

## 2012-04-09 NOTE — Progress Notes (Signed)
Subjective:    Patient ID: Bruce [name], male    DOB: August 13, 1950, 62 y.o.   MRN: [id number]  HPI 62 year old ex-heavy smoker with Crohn's disease referred for evaluation of obstructive sleep apnea. Baseline polysomnogram no [place] in 2/08 showed severe obstructive sleep apnea with no AHI of 34 per hour and lowest desaturation of 83% A similar study at [facility] in 8/08 with his weight at 2:30 pounds BMI of 31 showed an AHI of no 37 per hour. This was corrected by CPAP of 11 cm with a Quattro fullface mask. He was not able to use a full face mask for more than 2 months due to mask leak. His right eye would not close all the way and eye would dry out due to the leak. He then tried nasal pillows for another month but had the same problem with an air leak. He tried an eye patch but this kept dislocating since he moves around a lot due to left hip pain in the sleep. Pt c/o waking up choking at night now. Epworth sleepiness score is 16/24 and a report sleepiness and very suppression such as sitting and reading, lying down to rest or as a passenger in a car. He had sinus surgery in 2009 by Dr. Wilburn Cornelia but this did not help. Bedtime is 1 AM, sleep latency is 15-30 minutes, he sleeps on his side with 2 pillows, he is 3-4 awakenings including nocturia and is out of bed at 6:00 with dryness of mouth but denies headaches. He is gained about 20-30 pounds since the sleep study.  There is no history suggestive of cataplexy, sleep paralysis or parasomnias   Past Medical History  Diagnosis Date  . Crohn's disease   . Hypertension   . Sleep apnea   . Prostate enlargement     Past Surgical History  Procedure Date  . Nasal sinus surgery   . Cholecystectomy   . Colonoscopy   . Spleenectomy   . Seventh nerve face   . Colonoscopy 01/19/2011    Procedure: COLONOSCOPY;  Surgeon: Rogene Houston, MD;  Location: [facility];  Service: Endoscopy;  Laterality: N/A;  9:30 am  . Colon surgery     No  Known Allergies  History   Social History  . Marital Status: Married    Spouse Name: N/A    Number of Children: N/A  . Years of Education: N/A   Occupational History  . retired    Social History Main Topics  . Smoking status: Former Smoker -- 4.0 packs/day for 30 years    Types: Cigarettes    Quit date: 12/24/2005  . Smokeless tobacco: Never Used  . Alcohol Use: No  . Drug Use: No  . Sexually Active: Yes   Other Topics Concern  . Not on file   Social History Narrative  . No narrative on file      Review of Systems neg for any significant sore throat, dysphagia, itching, sneezing, nasal congestion or excess/ purulent secretions, fever, chills, sweats, unintended wt loss, pleuritic or exertional cp, hempoptysis, orthopnea pnd or change in chronic leg swelling. Also denies presyncope, palpitations, heartburn, abdominal pain, nausea, vomiting, diarrhea or change in bowel or urinary habits, dysuria,hematuria, rash, arthralgias, visual complaints, headache, numbness weakness or ataxia.     Objective:   Physical Exam  Gen. Pleasant, obese, in no distress, normal affect ENT - no lesions, no post nasal drip, class 2-3 airway Neck: No JVD, no thyromegaly, no carotid bruits Lungs:  no use of accessory muscles, no dullness to percussion, decreased without rales or rhonchi  Cardiovascular: Rhythm regular, heart sounds  normal, no murmurs or gallops, no peripheral edema Abdomen: soft and non-tender, no hepatosplenomegaly, BS normal. Musculoskeletal: No deformities, no cyanosis or clubbing Neuro:  alert, non focal, no tremors, lt facial palsy        Assessment & Plan:

## 2012-04-11 ENCOUNTER — Other Ambulatory Visit: Payer: Self-pay

## 2012-04-11 MED ORDER — LISINOPRIL 10 MG PO TABS: 10.0000 mg | ORAL_TABLET | Freq: Every day | ORAL | Status: DC

## 2012-05-08 ENCOUNTER — Other Ambulatory Visit (INDEPENDENT_AMBULATORY_CARE_PROVIDER_SITE_OTHER): Payer: Self-pay | Admitting: Internal Medicine

## 2012-05-20 ENCOUNTER — Ambulatory Visit (INDEPENDENT_AMBULATORY_CARE_PROVIDER_SITE_OTHER): Payer: BC Managed Care – PPO | Admitting: Internal Medicine

## 2012-05-20 ENCOUNTER — Other Ambulatory Visit (INDEPENDENT_AMBULATORY_CARE_PROVIDER_SITE_OTHER): Payer: BC Managed Care – PPO

## 2012-05-20 ENCOUNTER — Ambulatory Visit (INDEPENDENT_AMBULATORY_CARE_PROVIDER_SITE_OTHER)
Admission: RE | Admit: 2012-05-20 | Discharge: 2012-05-20 | Disposition: A | Discharge status: Home / Self Care | Payer: BC Managed Care – PPO | Source: Ambulatory Visit | Attending: Internal Medicine | Admitting: Internal Medicine

## 2012-05-20 ENCOUNTER — Encounter: Payer: Self-pay | Admitting: Internal Medicine

## 2012-05-20 VITALS — BP 104/70 | HR 90 | Temp 98.7°F | Resp 16 | Wt 266.0 lb

## 2012-05-20 DIAGNOSIS — R0989 Other specified symptoms and signs involving the circulatory and respiratory systems: Secondary | ICD-10-CM

## 2012-05-20 DIAGNOSIS — R0609 Other forms of dyspnea: Secondary | ICD-10-CM

## 2012-05-20 DIAGNOSIS — IMO0001 Reserved for inherently not codable concepts without codable children: Secondary | ICD-10-CM | POA: Insufficient documentation

## 2012-05-20 DIAGNOSIS — R0600 Dyspnea, unspecified: Secondary | ICD-10-CM

## 2012-05-20 DIAGNOSIS — B351 Tinea unguium: Secondary | ICD-10-CM | POA: Insufficient documentation

## 2012-05-20 DIAGNOSIS — K5900 Constipation, unspecified: Secondary | ICD-10-CM

## 2012-05-20 DIAGNOSIS — I10 Essential (primary) hypertension: Secondary | ICD-10-CM

## 2012-05-20 DIAGNOSIS — L600 Ingrowing nail: Secondary | ICD-10-CM

## 2012-05-20 LAB — CBC WITH DIFFERENTIAL/PLATELET
Basophils Absolute: 0.1 10*3/uL (ref 0.0–0.1)
Basophils Relative: 0.7 % (ref 0.0–3.0)
Eosinophils Absolute: 0.2 10*3/uL (ref 0.0–0.7)
Eosinophils Relative: 2.8 % (ref 0.0–5.0)
HCT: 40.8 % (ref 39.0–52.0)
Hemoglobin: 13.5 g/dL (ref 13.0–17.0)
Lymphocytes Relative: 25.3 % (ref 12.0–46.0)
Lymphs Abs: 2 10*3/uL (ref 0.7–4.0)
MCHC: 33.1 g/dL (ref 30.0–36.0)
MCV: 84.8 fl (ref 78.0–100.0)
Monocytes Absolute: 0.7 10*3/uL (ref 0.1–1.0)
Monocytes Relative: 8.8 % (ref 3.0–12.0)
Neutro Abs: 4.9 10*3/uL (ref 1.4–7.7)
Neutrophils Relative %: 62.4 % (ref 43.0–77.0)
Platelets: 392 10*3/uL (ref 150.0–400.0)
RBC: 4.81 Mil/uL (ref 4.22–5.81)
RDW: 15.5 % — ABNORMAL HIGH (ref 11.5–14.6)
WBC: 7.8 10*3/uL (ref 4.5–10.5)

## 2012-05-20 LAB — BRAIN NATRIURETIC PEPTIDE: Pro B Natriuretic peptide (BNP): 3 pg/mL (ref 0.0–100.0)

## 2012-05-20 LAB — CARDIAC PANEL
CK-MB: 1.3 ng/mL (ref 0.3–4.0)
Relative Index: 0.6 calc (ref 0.0–2.5)
Total CK: 224 U/L (ref 7–232)

## 2012-05-20 LAB — TROPONIN I: Troponin I: <0.01 ng/mL (ref <0.06)

## 2012-05-20 NOTE — Assessment & Plan Note (Signed)
abd series shows nothing acute He will continue using miralax

## 2012-05-20 NOTE — Progress Notes (Signed)
Subjective:    Patient ID: Bruce [name], male    DOB: 07-30-1950, 62 y.o.   MRN: [id number]  Shortness of Breath This is a new problem. The current episode started 1 to 4 weeks ago. The problem occurs intermittently. The problem has been unchanged. Associated symptoms include leg swelling. Pertinent negatives include no abdominal pain, chest pain, claudication, coryza, ear pain, fever, headaches, hemoptysis, leg pain, neck pain, orthopnea, PND, rash, rhinorrhea, sore throat, sputum production, swollen glands, syncope, vomiting or wheezing. The symptoms are aggravated by exercise. The patient has no known risk factors for DVT/PE. He has tried nothing for the symptoms. There is no history of asthma, bronchiolitis, CAD, DVT, a heart failure, pneumonia or a recent surgery.      Review of Systems  Constitutional: Positive for fatigue. Negative for fever, chills, diaphoresis, activity change, appetite change and unexpected weight change.  HENT: Negative.  Negative for ear pain, sore throat, rhinorrhea and neck pain.   Eyes: Negative.   Respiratory: Positive for apnea and shortness of breath. Negative for cough, hemoptysis, sputum production, choking, chest tightness, wheezing and stridor.   Cardiovascular: Positive for leg swelling. Negative for chest pain, palpitations, orthopnea, claudication, syncope and PND.  Gastrointestinal: Positive for constipation and abdominal distention (abd feels bloated). Negative for nausea, vomiting, abdominal pain, diarrhea, blood in stool, anal bleeding and rectal pain.  Endocrine: Negative.   Genitourinary: Negative.   Musculoskeletal: Negative.  Negative for myalgias, back pain, joint swelling, arthralgias and gait problem.  Skin: Negative.  Negative for color change, pallor, rash and wound.  Allergic/Immunologic: Negative.   Neurological: Negative.  Negative for dizziness, weakness, light-headedness, numbness and headaches.  Hematological: Negative.  Negative  for adenopathy. Does not bruise/bleed easily.  Psychiatric/Behavioral: Negative.        Objective:   Physical Exam  Vitals reviewed. Constitutional: He is oriented to person, place, and time. He appears well-developed and well-nourished. No distress.  HENT:  Head: Normocephalic and atraumatic.  Mouth/Throat: Oropharynx is clear and moist. No oropharyngeal exudate.  Eyes: Conjunctivae are normal. Right eye exhibits no discharge. Left eye exhibits no discharge. No scleral icterus.  Neck: Normal range of motion. Neck supple. No JVD present. No tracheal deviation present. No thyromegaly present.  Cardiovascular: Normal rate, regular rhythm, normal heart sounds and intact distal pulses.  Exam reveals no gallop and no friction rub.   No murmur heard. Pulmonary/Chest: Effort normal and breath sounds normal. No stridor. No respiratory distress. He has no wheezes. He has no rales. He exhibits no tenderness.  Abdominal: Soft. Bowel sounds are normal. He exhibits no distension and no mass. There is no tenderness. There is no rebound and no guarding.  Musculoskeletal: Normal range of motion. He exhibits edema (1+ edema in BLE). He exhibits no tenderness.  Lymphadenopathy:    He has no cervical adenopathy.  Neurological: He is oriented to person, place, and time.  Skin: Skin is warm and dry. No rash noted. He is not diaphoretic. No erythema. No pallor.  All of his toenails have thickening with lysis and subungual debris, there is no erythema, swelling, ttp, warmth, exudate  Psychiatric: He has a normal mood and affect. His behavior is normal. Judgment and thought content normal.      Lab Results  Component Value Date   WBC 7.4 04/03/2012   HGB 13.8 04/03/2012   HCT 41.6 04/03/2012   PLT 414.0* 04/03/2012   GLUCOSE 99 04/03/2012   CHOL 160 04/03/2012   TRIG 158.0* 04/03/2012  HDL 33.90* 04/03/2012   LDLCALC 95 04/03/2012   ALT 24 04/03/2012   AST 27 04/03/2012   NA 136 04/03/2012   K 4.0 04/03/2012    CL 104 04/03/2012   CREATININE 1.2 04/03/2012   BUN 15 04/03/2012   CO2 25 04/03/2012   TSH 1.98 04/03/2012   HGBA1C 6.8* 04/03/2012      Assessment & Plan:

## 2012-05-20 NOTE — Assessment & Plan Note (Signed)
His BP is well controlled 

## 2012-05-20 NOTE — Patient Instructions (Addendum)
Constipation, Adult Constipation is when a person has fewer than 3 bowel movements a week; has difficulty having a bowel movement; or has stools that are dry, hard, or larger than normal. As people grow older, constipation is more common. If you try to fix constipation with medicines that make you have a bowel movement (laxatives), the problem may get worse. Long-term laxative use may cause the muscles of the colon to become weak. A low-fiber diet, not taking in enough fluids, and taking certain medicines may make constipation worse. CAUSES   Certain medicines, such as antidepressants, pain medicine, iron supplements, antacids, and water pills.   Certain diseases, such as diabetes, irritable bowel syndrome (IBS), thyroid disease, or depression.   Not drinking enough water.   Not eating enough fiber-rich foods.   Stress or travel.  Lack of physical activity or exercise.  Not going to the restroom when there is the urge to have a bowel movement.  Ignoring the urge to have a bowel movement.  Using laxatives too much. SYMPTOMS   Having fewer than 3 bowel movements a week.   Straining to have a bowel movement.   Having hard, dry, or larger than normal stools.   Feeling full or bloated.   Pain in the lower abdomen.  Not feeling relief after having a bowel movement. DIAGNOSIS  Your caregiver will take a medical history and perform a physical exam. Further testing may be done for severe constipation. Some tests may include:   A barium enema X-ray to examine your rectum, colon, and sometimes, your small intestine.  A sigmoidoscopy to examine your lower colon.  A colonoscopy to examine your entire colon. TREATMENT  Treatment will depend on the severity of your constipation and what is causing it. Some dietary treatments include drinking more fluids and eating more fiber-rich foods. Lifestyle treatments may include regular exercise. If these diet and lifestyle recommendations  do not help, your caregiver may recommend taking over-the-counter laxative medicines to help you have bowel movements. Prescription medicines may be prescribed if over-the-counter medicines do not work.  HOME CARE INSTRUCTIONS   Increase dietary fiber in your diet, such as fruits, vegetables, whole grains, and beans. Limit high-fat and processed sugars in your diet, such as [place] fries, hamburgers, cookies, candies, and soda.   A fiber supplement may be added to your diet if you cannot get enough fiber from foods.   Drink enough fluids to keep your urine clear or pale yellow.   Exercise regularly or as directed by your caregiver.   Go to the restroom when you have the urge to go. Do not hold it.  Only take medicines as directed by your caregiver. Do not take other medicines for constipation without talking to your caregiver first. [place] IF:   You have bright red blood in your stool.   Your constipation lasts for more than 4 days or gets worse.   You have abdominal or rectal pain.   You have thin, pencil-like stools.  You have unexplained weight loss. MAKE SURE YOU:   Understand these instructions.  Will watch your condition.  Will get help right away if you are not doing well or get worse. Document Released: 11/18/2003 Document Revised: 05/14/2011 Document Reviewed: 01/23/2011 [facility] Patient Information 2013 [place]. Shortness of Breath Shortness of breath means you have trouble breathing. Shortness of breath may indicate that you have a medical problem. You should seek immediate medical care for shortness of breath. CAUSES   Not  enough oxygen in the air (as with high altitudes or a smoke-filled room).  Short-term (acute) lung disease, including:  Infections, such as pneumonia.  Fluid in the lungs, such as heart failure.  A blood clot in the lungs (pulmonary embolism).  Long-term (chronic) lung diseases.  Heart disease (heart  attack, angina, heart failure, and others).  Low red blood cells (anemia).  Poor physical fitness. This can cause shortness of breath when you exercise.  Chest or back injuries or stiffness.  Being overweight.  Smoking.  Anxiety. This can make you feel like you are not getting enough air. DIAGNOSIS  Serious medical problems can usually be found during your physical exam. Tests may also be done to determine why you are having shortness of breath. Tests may include:  Chest X-rays.  Lung function tests.  Blood tests.  Electrocardiography.  Exercise testing.  Echocardiography.  Imaging scans. Your caregiver may not be able to find a cause for your shortness of breath after your exam. In this case, it is important to have a follow-up exam with your caregiver as directed.  TREATMENT  Treatment for shortness of breath depends on the cause of your symptoms and can vary greatly. HOME CARE INSTRUCTIONS   Do not smoke. Smoking is a common cause of shortness of breath. If you smoke, ask for help to quit.  Avoid being around chemicals or things that may bother your breathing, such as paint fumes and dust.  Rest as needed. Slowly resume your usual activities.  If medicines were prescribed, take them as directed for the full length of time directed. This includes oxygen and any inhaled medicines.  Keep all follow-up appointments as directed by your caregiver. SEEK MEDICAL CARE IF:   Your condition does not improve in the time expected.  You have a hard time doing your normal activities even with rest.  You have any side effects or problems with the medicines prescribed.  You develop any new symptoms. SEEK IMMEDIATE MEDICAL CARE IF:   Your shortness of breath gets worse.  You feel lightheaded, faint, or develop a cough not controlled with medicines.  You start coughing up blood.  You have pain with breathing.  You have chest pain or pain in your arms, shoulders, or  abdomen.  You have a fever.  You are unable to walk up stairs or exercise the way you normally do. MAKE SURE YOU:  Understand these instructions.  Will watch your condition.  Will get help right away if you are not doing well or get worse. Document Released: 11/14/2000 Document Revised: 08/21/2011 Document Reviewed: 05/07/2011 [facility] Patient Information 2013 [place].

## 2012-05-20 NOTE — Assessment & Plan Note (Signed)
Podiatry referral

## 2012-05-20 NOTE — Assessment & Plan Note (Signed)
EKG is normal,CXR is normal- all enzymes today are normal This may may be obesity/conditioning He has risk factors for CAD so I have asked him to see cardiology

## 2012-05-20 NOTE — Assessment & Plan Note (Signed)
No meds needed yet He will begin lifestyle modifications

## 2012-06-09 ENCOUNTER — Ambulatory Visit (INDEPENDENT_AMBULATORY_CARE_PROVIDER_SITE_OTHER): Payer: 59 | Admitting: Cardiovascular Disease

## 2012-06-09 ENCOUNTER — Encounter: Payer: Self-pay | Admitting: Cardiovascular Disease

## 2012-06-09 VITALS — BP 118/78 | HR 100 | Ht 72.0 in | Wt 267.8 lb

## 2012-06-09 DIAGNOSIS — R0609 Other forms of dyspnea: Secondary | ICD-10-CM

## 2012-06-09 DIAGNOSIS — R079 Chest pain, unspecified: Secondary | ICD-10-CM

## 2012-06-09 DIAGNOSIS — R0989 Other specified symptoms and signs involving the circulatory and respiratory systems: Secondary | ICD-10-CM

## 2012-06-09 NOTE — Assessment & Plan Note (Signed)
Well controlled.  Continue current medications and low sodium [place] type diet.    

## 2012-06-09 NOTE — Assessment & Plan Note (Signed)
Discussed [place] type diet  Will need glucophage or other oral hypoglycemic if he doesn't lose weight

## 2012-06-09 NOTE — Patient Instructions (Addendum)
**Note De-Identified  Obfuscation** Your physician has requested that you have an echocardiogram. Echocardiography is a painless test that uses sound waves to create images of your heart. It provides your doctor with information about the size and shape of your heart and how well your heart's chambers and valves are working. This procedure takes approximately one hour. There are no restrictions for this procedure.  Your physician has requested that you have an exercise tolerance test. For further information please visit [email or web address]. Please also follow instruction sheet, as given.  Your physician recommends that you continue on your current medications as directed. Please refer to the Current Medication list given to you today.  Your physician recommends that you schedule a follow-up appointment in: as needed. We will contact you with test results.

## 2012-06-09 NOTE — Progress Notes (Signed)
Patient ID: Bruce [name], male   DOB: [birth year], 62 y.o.   MRN: [id number] 62 yo referred by Dr Ronnald Ramp for dyspnea.  BNP normal.  No history of CAD, CHF or chronic lung disease.  He is sedentary, overweight an prediabetic.  Has high carb poor diet.  Occasional atypical fleeting sharp left sided SSCP once every two weeks. Not pleuritic, positional or exertional.  Retired from [employer] and sedentary.  Compliant with BP meds. Denies excess ETOH and non smoker.  No wheezing LE edema or leg pain.     ROS: Denies fever, malais, weight loss, blurry vision, decreased visual acuity, cough, sputum, SOB, hemoptysis, pleuritic pain, palpitaitons, heartburn, abdominal pain, melena, lower extremity edema, claudication, or rash.  All other systems reviewed and negative   General: Affect appropriate Obese white male HEENT: normal Neck supple with no adenopathy JVP normal no bruits no thyromegaly Lungs clear with no wheezing and good diaphragmatic motion Heart:  S1/S2 no murmur,rub, gallop or click PMI normal Abdomen: benighn, BS positve, no tenderness, no AAA no bruit.  No HSM or HJR Distal pulses intact with no bruits No edema Neuro non-focal Skin warm and dry No muscular weakness  Medications Current Outpatient Prescriptions  Medication Sig Dispense Refill  . aspirin 81 MG tablet Take 81 mg by mouth daily.        Marland Kitchen dutasteride (AVODART) 0.5 MG capsule Take 0.5 mg by mouth daily.        Marland Kitchen levothyroxine (SYNTHROID, LEVOTHROID) 175 MCG tablet Take 175 mcg by mouth daily.        Marland Kitchen lisinopril (PRINIVIL,ZESTRIL) 10 MG tablet Take 1 tablet (10 mg total) by mouth daily.  90 tablet  3  . PENTASA 500 MG CR capsule TAKE 2 CAPSULES BY MOUTH 4 TIMES A DAY  240 capsule  3  . polyethylene glycol (MIRALAX / GLYCOLAX) packet Take 17 g by mouth daily.         No current facility-administered medications for this visit.    Allergies Review of patient's allergies indicates no known allergies.  Family  History: Family History  Problem Relation Age of Onset  . Colon cancer Mother   . Colon cancer Sister   . Healthy Sister   . Healthy Sister   . Crohn's disease Daughter   . Healthy Son   . Arthritis Other   . Heart disease Other   . Cancer Other     Colon, Uterine and Prostate Cancer    Social History: History   Social History  . Marital Status: Married    Spouse Name: N/A    Number of Children: N/A  . Years of Education: N/A   Occupational History  . retired    Social History Main Topics  . Smoking status: Former Smoker -- 4.00 packs/day for 30 years    Types: Cigarettes    Quit date: 12/24/2005  . Smokeless tobacco: Never Used  . Alcohol Use: No  . Drug Use: No  . Sexually Active: Yes   Other Topics Concern  . Not on file   Social History Narrative  . No narrative on file    Electrocardiogram: 05/20/12  SR rate 96  Normal ECG  Assessment and Plan

## 2012-06-09 NOTE — Assessment & Plan Note (Signed)
Likely obesity and deconditioning.  Check echo.  With CRF;s and atypical chest pain will refer for ETT as well

## 2012-06-18 ENCOUNTER — Ambulatory Visit (HOSPITAL_COMMUNITY): Payer: 59 | Attending: Cardiology | Admitting: Radiology

## 2012-06-18 DIAGNOSIS — R0609 Other forms of dyspnea: Secondary | ICD-10-CM | POA: Insufficient documentation

## 2012-06-18 DIAGNOSIS — R079 Chest pain, unspecified: Secondary | ICD-10-CM | POA: Insufficient documentation

## 2012-06-18 DIAGNOSIS — R072 Precordial pain: Secondary | ICD-10-CM

## 2012-06-18 DIAGNOSIS — R0989 Other specified symptoms and signs involving the circulatory and respiratory systems: Secondary | ICD-10-CM

## 2012-06-18 NOTE — Progress Notes (Signed)
Echocardiogram performed.  

## 2012-07-01 ENCOUNTER — Ambulatory Visit (INDEPENDENT_AMBULATORY_CARE_PROVIDER_SITE_OTHER): Payer: 59 | Admitting: Physician Assistant

## 2012-07-01 DIAGNOSIS — R0989 Other specified symptoms and signs involving the circulatory and respiratory systems: Secondary | ICD-10-CM

## 2012-07-01 DIAGNOSIS — R079 Chest pain, unspecified: Secondary | ICD-10-CM

## 2012-07-01 DIAGNOSIS — R0602 Shortness of breath: Secondary | ICD-10-CM

## 2012-07-01 DIAGNOSIS — R0609 Other forms of dyspnea: Secondary | ICD-10-CM

## 2012-07-01 NOTE — Progress Notes (Signed)
Exercise Treadmill Test  Pre-Exercise Testing Evaluation Rhythm: normal sinus  Rate: 93                 Test  Exercise Tolerance Test Ordering MD: Mertie Moores, MD  Interpreting MD: [name], PA-C  Unique Test No: 1  Treadmill:  1  Indication for ETT: Dyspnea  Contraindication to ETT: No   Stress Modality: exercise - treadmill  Cardiac Imaging Performed: non   Protocol: standard Bruce - maximal  Max BP:  181/83  Max MPHR (bpm):  157 85% MPR (bpm):  133  MPHR obtained (bpm):  155 % MPHR obtained:  99%  Reached 85% MPHR (min:sec):  3:18 Total Exercise Time (min-sec):  5:05  Workload in METS:  7.2 Borg Scale: 14  Reason ETT Terminated:  fatigue    ST Segment Analysis At Rest: normal ST segments - no evidence of significant ST depression With Exercise: no evidence of significant ST depression  Other Information Arrhythmia:  No Angina during ETT:  absent (0) Quality of ETT:  diagnostic  ETT Interpretation:  normal - no evidence of ischemia by ST analysis  Comments: Fair exercise tolerance. No chest pain. He did c/o dyspnea. Normal BP response to exercise. No ST-T changes to suggest ischemia.   Recommendations: F/u with Dr. Jenkins Rouge as directed. Danton Sewer, PA-C  11:25 AM 07/01/2012

## 2012-12-17 ENCOUNTER — Other Ambulatory Visit (INDEPENDENT_AMBULATORY_CARE_PROVIDER_SITE_OTHER): Payer: 59

## 2012-12-17 ENCOUNTER — Encounter: Payer: Self-pay | Admitting: Internal Medicine

## 2012-12-17 ENCOUNTER — Ambulatory Visit (INDEPENDENT_AMBULATORY_CARE_PROVIDER_SITE_OTHER): Payer: 59 | Admitting: Internal Medicine

## 2012-12-17 VITALS — BP 100/78 | HR 112 | Temp 98.0°F | Resp 16 | Ht 72.0 in | Wt 268.2 lb

## 2012-12-17 DIAGNOSIS — IMO0001 Reserved for inherently not codable concepts without codable children: Secondary | ICD-10-CM

## 2012-12-17 DIAGNOSIS — R0609 Other forms of dyspnea: Secondary | ICD-10-CM

## 2012-12-17 DIAGNOSIS — R0600 Dyspnea, unspecified: Secondary | ICD-10-CM

## 2012-12-17 DIAGNOSIS — E785 Hyperlipidemia, unspecified: Secondary | ICD-10-CM

## 2012-12-17 DIAGNOSIS — E039 Hypothyroidism, unspecified: Secondary | ICD-10-CM

## 2012-12-17 DIAGNOSIS — N400 Enlarged prostate without lower urinary tract symptoms: Secondary | ICD-10-CM

## 2012-12-17 DIAGNOSIS — I10 Essential (primary) hypertension: Secondary | ICD-10-CM

## 2012-12-17 DIAGNOSIS — R0989 Other specified symptoms and signs involving the circulatory and respiratory systems: Secondary | ICD-10-CM

## 2012-12-17 DIAGNOSIS — G4730 Sleep apnea, unspecified: Secondary | ICD-10-CM

## 2012-12-17 HISTORY — DX: Hyperlipidemia, unspecified: E78.5

## 2012-12-17 LAB — COMPREHENSIVE METABOLIC PANEL
ALT: 23 U/L (ref 0–53)
AST: 28 U/L (ref 0–37)
Albumin: 3.6 g/dL (ref 3.5–5.2)
Alkaline Phosphatase: 89 U/L (ref 39–117)
BUN: 14 mg/dL (ref 6–23)
CO2: 26 mEq/L (ref 19–32)
Calcium: 9 mg/dL (ref 8.4–10.5)
Chloride: 103 mEq/L (ref 96–112)
Creatinine, Ser: 1.4 mg/dL (ref 0.4–1.5)
GFR: 54.75 mL/min — ABNORMAL LOW (ref >60.00)
Glucose, Bld: 93 mg/dL (ref 70–99)
Potassium: 4 mEq/L (ref 3.5–5.1)
Sodium: 137 mEq/L (ref 135–145)
Total Bilirubin: 0.5 mg/dL (ref 0.3–1.2)
Total Protein: 6.9 g/dL (ref 6.0–8.3)

## 2012-12-17 LAB — TSH: TSH: 2.85 u[IU]/mL (ref 0.35–5.50)

## 2012-12-17 LAB — HEMOGLOBIN A1C: Hgb A1c MFr Bld: 6.9 % — ABNORMAL HIGH (ref 4.6–6.5)

## 2012-12-17 LAB — PSA: PSA: 1.84 ng/mL (ref 0.10–4.00)

## 2012-12-17 LAB — CBC WITH DIFFERENTIAL/PLATELET
Basophils Absolute: 0.1 10*3/uL (ref 0.0–0.1)
Basophils Relative: 1.3 % (ref 0.0–3.0)
Eosinophils Absolute: 0.3 10*3/uL (ref 0.0–0.7)
Eosinophils Relative: 3.8 % (ref 0.0–5.0)
HCT: 40.5 % (ref 39.0–52.0)
Hemoglobin: 13.3 g/dL (ref 13.0–17.0)
Lymphocytes Relative: 31.5 % (ref 12.0–46.0)
Lymphs Abs: 2.5 10*3/uL (ref 0.7–4.0)
MCHC: 32.7 g/dL (ref 30.0–36.0)
MCV: 83.6 fl (ref 78.0–100.0)
Monocytes Absolute: 1 10*3/uL (ref 0.1–1.0)
Monocytes Relative: 12.5 % — ABNORMAL HIGH (ref 3.0–12.0)
Neutro Abs: 4 10*3/uL (ref 1.4–7.7)
Neutrophils Relative %: 50.9 % (ref 43.0–77.0)
Platelets: 408 10*3/uL — ABNORMAL HIGH (ref 150.0–400.0)
RBC: 4.84 Mil/uL (ref 4.22–5.81)
RDW: 16.3 % — ABNORMAL HIGH (ref 11.5–14.6)
WBC: 7.8 10*3/uL (ref 4.5–10.5)

## 2012-12-17 LAB — LIPID PANEL
Cholesterol: 171 mg/dL (ref 0–200)
HDL: 34.9 mg/dL — ABNORMAL LOW (ref >39.00)
Total CHOL/HDL Ratio: 5
Triglycerides: 275 mg/dL — ABNORMAL HIGH (ref 0.0–149.0)
VLDL: 55 mg/dL — ABNORMAL HIGH (ref 0.0–40.0)

## 2012-12-17 LAB — LDL CHOLESTEROL, DIRECT: Direct LDL: 102.6 mg/dL

## 2012-12-17 NOTE — Progress Notes (Signed)
Subjective:    Patient ID: Bruce [name], male    DOB: 03/12/1950, 62 y.o.   MRN: [id number]  Thyroid Problem Presents for follow-up visit. Symptoms include fatigue. Patient reports no anxiety, cold intolerance, constipation, depressed mood, diaphoresis, diarrhea, dry skin, hair loss, heat intolerance, hoarse voice, leg swelling, nail problem, palpitations, tremors, visual change, weight gain or weight loss. The symptoms have been stable. Past treatments include levothyroxine. The treatment provided no relief.      Review of Systems  Constitutional: Positive for fatigue. Negative for fever, chills, weight loss, weight gain, diaphoresis, activity change, appetite change and unexpected weight change.  HENT: Negative.  Negative for hoarse voice.   Eyes: Negative.   Respiratory: Positive for apnea and shortness of breath. Negative for cough, choking, chest tightness, wheezing and stridor.   Cardiovascular: Negative.  Negative for chest pain, palpitations and leg swelling.  Gastrointestinal: Negative.  Negative for nausea, vomiting, abdominal pain, diarrhea, constipation and blood in stool.  Endocrine: Negative.  Negative for cold intolerance, heat intolerance, polydipsia, polyphagia and polyuria.  Genitourinary: Negative.  Negative for difficulty urinating.  Musculoskeletal: Negative.   Skin: Negative.   Allergic/Immunologic: Negative.   Neurological: Negative.  Negative for dizziness, tremors, speech difficulty, weakness, light-headedness and headaches.  Hematological: Negative.  Negative for adenopathy. Does not bruise/bleed easily.  Psychiatric/Behavioral: Positive for sleep disturbance. Negative for suicidal ideas, hallucinations, behavioral problems, confusion, self-injury, dysphoric mood, decreased concentration and agitation. The patient is not nervous/anxious and is not hyperactive.        Objective:   Physical Exam  Vitals reviewed. Constitutional: He is oriented to person,  place, and time. He appears well-developed and well-nourished. No distress.  HENT:  Head: Normocephalic and atraumatic.  Mouth/Throat: Oropharynx is clear and moist. No oropharyngeal exudate.  Eyes: Conjunctivae are normal. Right eye exhibits no discharge. Left eye exhibits no discharge. No scleral icterus.  Neck: Normal range of motion. Neck supple. No JVD present. No tracheal deviation present. No thyromegaly present.  Cardiovascular: Normal rate, regular rhythm, normal heart sounds and intact distal pulses.  Exam reveals no gallop and no friction rub.   No murmur heard. Pulmonary/Chest: Effort normal and breath sounds normal. No stridor. No respiratory distress. He has no wheezes. He has no rales. He exhibits no tenderness.  Abdominal: Soft. Bowel sounds are normal. He exhibits no distension and no mass. There is no tenderness. There is no rebound and no guarding. Hernia confirmed negative in the right inguinal area and confirmed negative in the left inguinal area.  Genitourinary: Rectum normal, prostate normal, testes normal and penis normal. Rectal exam shows no external hemorrhoid, no internal hemorrhoid, no fissure, no mass, no tenderness and anal tone normal. Guaiac negative stool. Prostate is not enlarged and not tender. Right testis shows no mass, no swelling and no tenderness. Right testis is descended. Left testis shows no mass, no swelling and no tenderness. Left testis is descended. Circumcised. No penile erythema or penile tenderness. No discharge found.  Musculoskeletal: Normal range of motion. He exhibits no edema and no tenderness.  Lymphadenopathy:    He has no cervical adenopathy.       Right: No inguinal adenopathy present.       Left: No inguinal adenopathy present.  Neurological: He is alert and oriented to person, place, and time. He has normal reflexes. He displays normal reflexes. A cranial nerve deficit (unchanged facial palsy) is present. He exhibits normal muscle tone.  Coordination normal.  Skin: Skin is warm and dry.  No rash noted. He is not diaphoretic. No erythema. No pallor.     Lab Results  Component Value Date   WBC 7.8 05/20/2012   HGB 13.5 05/20/2012   HCT 40.8 05/20/2012   PLT 392.0 05/20/2012   GLUCOSE 99 04/03/2012   CHOL 160 04/03/2012   TRIG 158.0* 04/03/2012   HDL 33.90* 04/03/2012   LDLCALC 95 04/03/2012   ALT 24 04/03/2012   AST 27 04/03/2012   NA 136 04/03/2012   K 4.0 04/03/2012   CL 104 04/03/2012   CREATININE 1.2 04/03/2012   BUN 15 04/03/2012   CO2 25 04/03/2012   TSH 1.98 04/03/2012   HGBA1C 6.8* 04/03/2012       Assessment & Plan:

## 2012-12-17 NOTE — Patient Instructions (Signed)
Type 2 Diabetes Mellitus, Adult Type 2 diabetes mellitus, often simply referred to as type 2 diabetes, is a long-lasting (chronic) disease. In type 2 diabetes, the pancreas does not make enough insulin (a hormone), the cells are less responsive to the insulin that is made (insulin resistance), or both. Normally, insulin moves sugars from food into the tissue cells. The tissue cells use the sugars for energy. The lack of insulin or the lack of normal response to insulin causes excess sugars to build up in the blood instead of going into the tissue cells. As a result, high blood sugar (hyperglycemia) develops. The effect of high sugar (glucose) levels can cause many complications. Type 2 diabetes was also previously called adult-onset diabetes but it can occur at any age.  RISK FACTORS  A person is predisposed to developing type 2 diabetes if someone in the family has the disease and also has one or more of the following primary risk factors:  Overweight.  An inactive lifestyle.  A history of consistently eating high-calorie foods. Maintaining a normal weight and regular physical activity can reduce the chance of developing type 2 diabetes. SYMPTOMS  A person with type 2 diabetes may not show symptoms initially. The symptoms of type 2 diabetes appear slowly. The symptoms include:  Increased thirst (polydipsia).  Increased urination (polyuria).  Increased urination during the night (nocturia).  Weight loss. This weight loss may be rapid.  Frequent, recurring infections.  Tiredness (fatigue).  Weakness.  Vision changes, such as blurred vision.  Fruity smell to your breath.  Abdominal pain.  Nausea or vomiting.  Cuts or bruises which are slow to heal.  Tingling or numbness in the hands or feet. DIAGNOSIS Type 2 diabetes is frequently not diagnosed until complications of diabetes are present. Type 2 diabetes is diagnosed when symptoms or complications are present and when blood  glucose levels are increased. Your blood glucose level may be checked by one or more of the following blood tests:  A fasting blood glucose test. You will not be allowed to eat for at least 8 hours before a blood sample is taken.  A random blood glucose test. Your blood glucose is checked at any time of the day regardless of when you ate.  A hemoglobin A1c blood glucose test. A hemoglobin A1c test provides information about blood glucose control over the previous 3 months.  An oral glucose tolerance test (OGTT). Your blood glucose is measured after you have not eaten (fasted) for 2 hours and then after you drink a glucose-containing beverage. TREATMENT   You may need to take insulin or diabetes medicine daily to keep blood glucose levels in the desired range.  You will need to match insulin dosing with exercise and healthy food choices. The treatment goal is to maintain the before meal blood sugar (preprandial glucose) level at 70 130 mg/dL. HOME CARE INSTRUCTIONS   Have your hemoglobin A1c level checked twice a year.  Perform daily blood glucose monitoring as directed by your caregiver.  Monitor urine ketones when you are ill and as directed by your caregiver.  Take your diabetes medicine or insulin as directed by your caregiver to maintain your blood glucose levels in the desired range.  Never run out of diabetes medicine or insulin. It is needed every day.  Adjust insulin based on your intake of carbohydrates. Carbohydrates can raise blood glucose levels but need to be included in your diet. Carbohydrates provide vitamins, minerals, and fiber which are an essential part of   a healthy diet. Carbohydrates are found in fruits, vegetables, whole grains, dairy products, legumes, and foods containing added sugars.    Eat healthy foods. Alternate 3 meals with 3 snacks.  Lose weight if overweight.  Carry a medical alert card or wear your medical alert jewelry.  Carry a 15 gram  carbohydrate snack with you at all times to treat low blood glucose (hypoglycemia). Some examples of 15 gram carbohydrate snacks include:  Glucose tablets, 3 or 4   Glucose gel, 15 gram tube  Raisins, 2 tablespoons (24 grams)  Jelly beans, 6  Animal crackers, 8  Regular pop, 4 ounces (120 mL)  Gummy treats, 9  Recognize hypoglycemia. Hypoglycemia occurs with blood glucose levels of 70 mg/dL and below. The risk for hypoglycemia increases when fasting or skipping meals, during or after intense exercise, and during sleep. Hypoglycemia symptoms can include:  Tremors or shakes.  Decreased ability to concentrate.  Sweating.  Increased heart rate.  Headache.  Dry mouth.  Hunger.  Irritability.  Anxiety.  Restless sleep.  Altered speech or coordination.  Confusion.  Treat hypoglycemia promptly. If you are alert and able to safely swallow, follow the 15:15 rule:  Take 15 20 grams of rapid-acting glucose or carbohydrate. Rapid-acting options include glucose gel, glucose tablets, or 4 ounces (120 mL) of fruit juice, regular soda, or low fat milk.  Check your blood glucose level 15 minutes after taking the glucose.  Take 15 20 grams more of glucose if the repeat blood glucose level is still 70 mg/dL or below.  Eat a meal or snack within 1 hour once blood glucose levels return to normal.    Be alert to polyuria and polydipsia which are early signs of hyperglycemia. An early awareness of hyperglycemia allows for prompt treatment. Treat hyperglycemia as directed by your caregiver.  Engage in at least 150 minutes of moderate-intensity physical activity a week, spread over at least 3 days of the week or as directed by your caregiver. In addition, you should engage in resistance exercise at least 2 times a week or as directed by your caregiver.  Adjust your medicine and food intake as needed if you start a new exercise or sport.  Follow your sick day plan at any time you  are unable to eat or drink as usual.  Avoid tobacco use.  Limit alcohol intake to no more than 1 drink per day for nonpregnant women and 2 drinks per day for men. You should drink alcohol only when you are also eating food. Talk with your caregiver whether alcohol is safe for you. Tell your caregiver if you drink alcohol several times a week.  Follow up with your caregiver regularly.  Schedule an eye exam soon after the diagnosis of type 2 diabetes and then annually.  Perform daily skin and foot care. Examine your skin and feet daily for cuts, bruises, redness, nail problems, bleeding, blisters, or sores. A foot exam by a caregiver should be done annually.  Brush your teeth and gums at least twice a day and floss at least once a day. Follow up with your dentist regularly.  Share your diabetes management plan with your workplace or school.  Stay up-to-date with immunizations.  Learn to manage stress.  Obtain ongoing diabetes education and support as needed.  Participate in, or seek rehabilitation as needed to maintain or improve independence and quality of life. Request a physical or occupational therapy referral if you are having foot or hand numbness or difficulties with grooming,   dressing, eating, or physical activity. SEEK MEDICAL CARE IF:   You are unable to eat food or drink fluids for more than 6 hours.  You have nausea and vomiting for more than 6 hours.  Your blood glucose level is over 240 mg/dL.  There is a change in mental status.  You develop an additional serious illness.  You have diarrhea for more than 6 hours.  You have been sick or have had a fever for a couple of days and are not getting better.  You have pain during any physical activity.  SEEK IMMEDIATE MEDICAL CARE IF:  You have difficulty breathing.  You have moderate to large ketone levels. MAKE SURE YOU:  Understand these instructions.  Will watch your condition.  Will get help right away if  you are not doing well or get worse. Document Released: 02/19/2005 Document Revised: 11/14/2011 Document Reviewed: 09/18/2011 [employer] Patient Information 2014 [employer].  

## 2012-12-18 ENCOUNTER — Encounter: Payer: Self-pay | Admitting: Internal Medicine

## 2012-12-18 MED ORDER — ROSUVASTATIN CALCIUM 5 MG PO TABS: 5.0000 mg | ORAL_TABLET | Freq: Every day | ORAL | Status: DC

## 2012-12-18 NOTE — Assessment & Plan Note (Signed)
His BP is well controlled 

## 2012-12-18 NOTE — Assessment & Plan Note (Signed)
His LDL is slightly elevated so I have asked him to start a statin

## 2012-12-18 NOTE — Assessment & Plan Note (Signed)
His A1C shows good control of the blood sugars No meds a re needed at this time

## 2012-12-18 NOTE — Assessment & Plan Note (Signed)
I have asked him to f/up with sleep med

## 2012-12-18 NOTE — Assessment & Plan Note (Signed)
Exam and PSA are normal

## 2012-12-18 NOTE — Assessment & Plan Note (Signed)
His TSH is on the normal range, no dosing adjustment needed

## 2012-12-26 ENCOUNTER — Ambulatory Visit: Payer: 59 | Admitting: Pulmonary Disease

## 2013-01-06 ENCOUNTER — Other Ambulatory Visit (INDEPENDENT_AMBULATORY_CARE_PROVIDER_SITE_OTHER): Payer: Self-pay | Admitting: Internal Medicine

## 2013-03-03 ENCOUNTER — Ambulatory Visit (INDEPENDENT_AMBULATORY_CARE_PROVIDER_SITE_OTHER)
Admission: RE | Admit: 2013-03-03 | Discharge: 2013-03-03 | Disposition: A | Discharge status: Home / Self Care | Payer: 59 | Source: Ambulatory Visit | Attending: Family Medicine | Admitting: Family Medicine

## 2013-03-03 ENCOUNTER — Ambulatory Visit (INDEPENDENT_AMBULATORY_CARE_PROVIDER_SITE_OTHER): Payer: 59 | Admitting: Family Medicine

## 2013-03-03 ENCOUNTER — Encounter: Payer: Self-pay | Admitting: Internal Medicine

## 2013-03-03 ENCOUNTER — Other Ambulatory Visit: Payer: Self-pay | Admitting: Family Medicine

## 2013-03-03 ENCOUNTER — Encounter: Payer: Self-pay | Admitting: Family Medicine

## 2013-03-03 VITALS — BP 110/82 | HR 103 | Wt 266.0 lb

## 2013-03-03 DIAGNOSIS — M546 Pain in thoracic spine: Secondary | ICD-10-CM

## 2013-03-03 MED ORDER — METHYLPREDNISOLONE ACETATE 80 MG/ML IJ SUSP
80.0000 mg | Freq: Once | INTRAMUSCULAR | Status: AC
  Administered 2013-03-03: 80 mg via INTRAMUSCULAR

## 2013-03-03 MED ORDER — MELOXICAM 15 MG PO TABS: 15.0000 mg | ORAL_TABLET | Freq: Every day | ORAL | Status: DC

## 2013-03-03 MED ORDER — KETOROLAC TROMETHAMINE 60 MG/2ML IM SOLN
60.0000 mg | Freq: Once | INTRAMUSCULAR | Status: AC
  Administered 2013-03-03: 60 mg via INTRAMUSCULAR

## 2013-03-03 MED ORDER — TRAMADOL HCL 50 MG PO TABS: 50.0000 mg | ORAL_TABLET | Freq: Every evening | ORAL | Status: DC | PRN

## 2013-03-03 NOTE — Assessment & Plan Note (Signed)
Patient is having an acute onset of thoracic back pain. Patient does have Crohn's disease which could decrease the amount of calcium which could increase his chance for a pathological compression fracture. We will get x-rays to rule out any bony abnormalities. Patient has no signs that this would be gastrointestinal or urology at this time. Patient given a Depo-Medrol injection as well as Toradol injection. Patient given a prescription for meloxicam the warned if he did start to hurt his stomach to stop the medication immediately. Discussed icing versus heat. A home exercises to start in 72 hours and followup in one week. Patient given handout and when to seek medical attention.

## 2013-03-03 NOTE — Patient Instructions (Signed)
Very nice to meet you Please go downstairs and get xrays today.  No news is good news Meloxicam daily for 10 days then as needed.  Ice 20 minutes 2 times a day Start exercises most days of the week starting in 3 days.  Come back in 1 week.

## 2013-03-03 NOTE — Progress Notes (Signed)
   CC: acute back pain  HPI: Patient is a very pleasant 62 year old gentleman coming in with acute thoracic back pain. Patient is a lumbar neutral injury. Patient states that it started approximately 1-2 weeks ago. Seems to be getting worse. Patient denies any radiation of recently numbness. Patient denies any redness or any fevers or chills. Patient also denies any dysuria. Patient does have a history of Crohn's disease but denies any change in bowel movement. This appeared to be localized to the thoracolumbar juncture. Patient relates that this is a sharp pain that seems to be constant. Patient points to the mid back area. Patient calls the pain unrelenting. The pain is 9/10. Never had back pain like this previously. Patient does have a history of Crohn's disease.  Past medical, surgical, family and social history reviewed. Medications reviewed all in the electronic medical record.   Review of Systems: No headache, visual changes, nausea, vomiting, diarrhea, constipation, dizziness, abdominal pain, skin rash, fevers, chills, night sweats, weight loss, swollen lymph nodes, body aches, joint swelling, muscle aches, chest pain, shortness of breath, mood changes.   Objective:    Blood pressure 110/82, pulse 103, weight 266 lb (120.657 kg), SpO2 93.00%.   General: No apparent distress alert and oriented x3 mood and affect normal, dressed appropriately. Obese patient is standing because it is not comfortable for sitting. HEENT: Pupils equal, extraocular movements intact Respiratory: Patient's speak in full sentences and does not appear short of breath Cardiovascular: No lower extremity edema, non tender, no erythema Skin: Warm dry intact with no signs of infection or rash on extremities or on axial skeleton. Abdomen: Soft nontender Neuro: Cranial nerves II through XII are intact, neurovascularly intact in all extremities with 2+ DTRs and 2+ pulses. Lymph: No lymphadenopathy of posterior or anterior  cervical chain or axillae bilaterally.  Gait normal with good balance and coordination.  MSK: Non tender with full range of motion and good stability and symmetric strength and tone of shoulders, elbows, wrist, hip, knee and ankles bilaterally.  Back Exam:  Inspection: Unremarkable patient is standing Motion: Flexion 15 deg, Extension 15 deg, Side Bending to 15 deg bilaterally,  Rotation to 25 deg bilaterally  SLR laying: Negative  XSLR laying: Negative  Palpable tenderness: Severe tenderness over the spinous process of T12 and L1 as well as the paraspinal musculature on the left side.Marland Kitchen FABER: negative. Sensory change: Gross sensation intact to all lumbar and sacral dermatomes.  Reflexes: 2+ at both patellar tendons, 2+ at achilles tendons, Babinski's downgoing.  Strength at foot  Plantar-flexion: 5/5 Dorsi-flexion: 5/5 Eversion: 5/5 Inversion: 5/5  Leg strength  Quad: 5/5 Hamstring: 5/5 Hip flexor: 5/5 Hip abductors: 5/5  Gait unremarkable overall   Impression and Recommendations:     This case required medical decision making of moderate complexity.

## 2013-03-03 NOTE — Progress Notes (Signed)
Pre-visit discussion using our clinic review tool. No additional management support is needed unless otherwise documented below in the visit note.  

## 2013-03-10 ENCOUNTER — Ambulatory Visit (INDEPENDENT_AMBULATORY_CARE_PROVIDER_SITE_OTHER): Payer: 59 | Admitting: Family Medicine

## 2013-03-10 ENCOUNTER — Encounter: Payer: Self-pay | Admitting: Family Medicine

## 2013-03-10 VITALS — BP 136/88 | HR 106

## 2013-03-10 DIAGNOSIS — M549 Dorsalgia, unspecified: Secondary | ICD-10-CM | POA: Insufficient documentation

## 2013-03-10 DIAGNOSIS — M546 Pain in thoracic spine: Secondary | ICD-10-CM

## 2013-03-10 NOTE — Patient Instructions (Signed)
Good to see you Continue exercises and go to physical therapy 1 time, they will call you to make the appointment Watch out for a rash still, if so call me.  Ice or heat can help as well.  Stop the meloxicam and see how the pain does.  If not all the way better in 2 weeks or worsening pain call me and we will get an MRI.

## 2013-03-10 NOTE — Assessment & Plan Note (Signed)
HEP continued  Go to physical therapy to her home exercises as well as significant improvement. Discussed with patient as well as his wife about potential need for further imaging such as an MRI but at this time I think as long as he is getting better but continues to monitor. Warned him to look out for any type or rash encases this is early zoster but patient does not seem painful when it comes to his shirt. Patient will come back again in 2-4 weeks we will see how patient is doing.

## 2013-03-10 NOTE — Progress Notes (Signed)
   CC: acute back pain followup  HPI: Patient is a very pleasant 63 year old gentleman coming in for followup of back pain. Patient was found to have back pain mostly over the thoracolumbar junction mostly on the right side. Patient was in such severe pain that he is unable to sit previously. Patient states that he is approximately 70% better. Still has discomfort with certain range of motion such as rotation or going into a flexed a. Patient denies any radiation around his torso or down his legs. Patient denies anything such as fevers or chills. Patient denies any flare of his Crohn's disease. Taken meloxicam without any discomfort to the stomach.  Past medical, surgical, family and social history reviewed. Medications reviewed all in the electronic medical record.   Review of Systems: No headache, visual changes, nausea, vomiting, diarrhea, constipation, dizziness, abdominal pain, skin rash, fevers, chills, night sweats, weight loss, swollen lymph nodes, body aches, joint swelling, muscle aches, chest pain, shortness of breath, mood changes.   Objective:    Blood pressure 136/88, pulse 106, SpO2 95.00%.   General: No apparent distress alert and oriented x3 mood and affect normal, dressed appropriately. Obese . HEENT: Pupils equal, extraocular movements intact, and does have Bell's palsy of the left side Respiratory: Patient's speak in full sentences and does not appear short of breath Cardiovascular: No lower extremity edema, non tender, no erythema Skin: Warm dry intact with no signs of infection or rash on extremities or on axial skeleton. Abdomen: Soft nontender Neuro: Cranial nerves II through XII are intact, neurovascularly intact in all extremities with 2+ DTRs and 2+ pulses. Lymph: No lymphadenopathy of posterior or anterior cervical chain or axillae bilaterally.  Gait normal with good balance and coordination.  MSK: Non tender with full range of motion and good stability and symmetric  strength and tone of shoulders, elbows, wrist, hip, knee and ankles bilaterally.  Back Exam:  Inspection: Unremarkable patient is standing Motion: Flexion 15 deg, Extension 15 deg, Side Bending to 15 deg bilaterally,  Rotation to 25 deg bilaterally  SLR laying: Negative  XSLR laying: Negative  Palpable tenderness: Continued severe tenderness to palpation over the T12-L1 region on the left side. Patient is even tender to light palpation but no rash noted.  FABER: negative. Sensory change: Gross sensation intact to all lumbar and sacral dermatomes.  Reflexes: 2+ at both patellar tendons, 2+ at achilles tendons, Babinski's downgoing.  Strength at foot  Plantar-flexion: 5/5 Dorsi-flexion: 5/5 Eversion: 5/5 Inversion: 5/5  Leg strength  Quad: 5/5 Hamstring: 5/5 Hip flexor: 5/5 Hip abductors: 5/5  Gait unremarkable overall   Impression and Recommendations:     This case required medical decision making of moderate complexity.

## 2013-03-30 ENCOUNTER — Other Ambulatory Visit: Payer: Self-pay | Admitting: Internal Medicine

## 2013-04-01 ENCOUNTER — Encounter (INDEPENDENT_AMBULATORY_CARE_PROVIDER_SITE_OTHER): Payer: Self-pay | Admitting: *Deleted

## 2013-04-14 ENCOUNTER — Ambulatory Visit (INDEPENDENT_AMBULATORY_CARE_PROVIDER_SITE_OTHER): Payer: 59 | Admitting: Internal Medicine

## 2013-04-14 ENCOUNTER — Encounter (INDEPENDENT_AMBULATORY_CARE_PROVIDER_SITE_OTHER): Payer: Self-pay | Admitting: Internal Medicine

## 2013-04-14 VITALS — BP 106/72 | HR 92 | Temp 98.0°F | Ht 72.0 in | Wt 262.9 lb

## 2013-04-14 DIAGNOSIS — K5000 Crohn's disease of small intestine without complications: Secondary | ICD-10-CM

## 2013-04-14 DIAGNOSIS — Z800 Family history of malignant neoplasm of digestive organs: Secondary | ICD-10-CM

## 2013-04-14 NOTE — Progress Notes (Addendum)
Subjective:     Patient ID: Bruce [name], male   DOB: [birth year], 63 y.o.   MRN: [id number]  HPI Here today for one year f/u of his Crohn's Disease. He was last seen in January of 2014. Hx of small bowel Crohn's. 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 He also had a few diverticula at the sigmoid colon and external hemorrhoids. Diagnosed in 2009. He tells me he is doing well as long as he takes his medication. Appetite is good. No weight loss. No abdominal pain. He usually has a BM every couple of days and sometimes he will have 4 a day. His stools are loose which is normal for him. He takes Miralax daily. He had his prostate checked in December and he had rectal bleeding afterwards.  PSA 4.4 in December. He is not exercising. He volunteers for hospice as a driver.     CBC    Component Value Date/Time   WBC 7.8 12/17/2012 1518   RBC 4.84 12/17/2012 1518   HGB 13.3 12/17/2012 1518   HCT 40.5 12/17/2012 1518   PLT 408.0* 12/17/2012 1518   MCV 83.6 12/17/2012 1518   MCHC 32.7 12/17/2012 1518   RDW 16.3* 12/17/2012 1518   LYMPHSABS 2.5 12/17/2012 1518   MONOABS 1.0 12/17/2012 1518   EOSABS 0.3 12/17/2012 1518   BASOSABS 0.1 12/17/2012 1518    CMP     Component Value Date/Time   NA 137 12/17/2012 1518   K 4.0 12/17/2012 1518   CL 103 12/17/2012 1518   CO2 26 12/17/2012 1518   GLUCOSE 93 12/17/2012 1518   BUN 14 12/17/2012 1518   CREATININE 1.4 12/17/2012 1518   CALCIUM 9.0 12/17/2012 1518   PROT 6.9 12/17/2012 1518   ALBUMIN 3.6 12/17/2012 1518   AST 28 12/17/2012 1518   ALT 23 12/17/2012 1518   ALKPHOS 89 12/17/2012 1518   BILITOT 0.5 12/17/2012 1518       Review of Systems see hpi Current Outpatient Prescriptions  Medication Sig Dispense Refill  . dutasteride (AVODART) 0.5 MG capsule Take 0.5 mg by mouth daily.        Marland Kitchen levothyroxine (SYNTHROID, LEVOTHROID) 175 MCG tablet Take 175 mcg by  mouth daily.        Marland Kitchen lisinopril (PRINIVIL,ZESTRIL) 10 MG tablet TAKE 1 TABLET BY MOUTH ONCE DAILY  90 tablet  3  . PENTASA 500 MG CR capsule TAKE 2 CAPSULES BY MOUTH 4 TIMES A DAY  240 capsule  11  . polyethylene glycol (MIRALAX / GLYCOLAX) packet Take 17 g by mouth daily.        Marland Kitchen aspirin 81 MG tablet Take 81 mg by mouth daily.         No current facility-administered medications for this visit.   Past Medical History  Diagnosis Date  . Crohn's disease   . Hypertension   . Sleep apnea   . Prostate enlargement    Past Surgical History  Procedure Laterality Date  . Nasal sinus surgery    . Cholecystectomy    . Colonoscopy    . 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 Seventh nerve face    . Colonoscopy  01/19/2011    Procedure: COLONOSCOPY;  Surgeon: Rogene Houston, MD;  Location: [facility];  Service: Endoscopy;  Laterality: N/A;  9:30 am  . Colon surgery     Allergies  Allergen  Reactions  . Coconut Flavor         Objective:   Physical Exam  Filed Vitals:   04/14/13 1408  BP: 106/72  Pulse: 92  Temp: 98 F (36.7 C)  Height: 6' (1.829 m)  Weight: 262 lb 14.4 oz (119.251 kg)   Alert and oriented. Skin warm and dry. Oral mucosa is moist.   . Sclera anicteric, conjunctivae is pink. Thyroid not enlarged. No cervical lymphadenopathy. Lungs clear. Heart regular rate and rhythm.  Abdomen is soft. Bowel sounds are positive. No hepatomegaly. No abdominal masses felt.Tenderness rt lower abdomen.   No edema to lower extremities. Patient is alert and oriented. Stool brown and guaiac negative.      Assessment:    Crohn's disease. He has tenderness rt lower quandrant. Possible Crohn's flare.     Plan:    CT abdomen and pelvis with CM. Further recommendations to follow.

## 2013-04-14 NOTE — Patient Instructions (Addendum)
CT abdomen/pelvis with CM. CRP Further recommendations to follow once we have CT back.

## 2013-04-14 NOTE — Addendum Note (Signed)
Addended by: Butch Penny on: 04/14/2013 02:40 PM   Modules accepted: Orders

## 2013-04-15 LAB — C-REACTIVE PROTEIN: CRP: <0.5 mg/dL (ref <0.60)

## 2013-04-15 LAB — CREATININE, SERUM: Creat: 1.32 mg/dL (ref 0.50–1.35)

## 2013-04-16 ENCOUNTER — Ambulatory Visit (HOSPITAL_COMMUNITY)
Admission: RE | Admit: 2013-04-16 | Discharge: 2013-04-16 | Disposition: A | Discharge status: Home / Self Care | Payer: 59 | Source: Ambulatory Visit | Attending: Internal Medicine | Admitting: Internal Medicine

## 2013-04-16 DIAGNOSIS — R1031 Right lower quadrant pain: Secondary | ICD-10-CM | POA: Insufficient documentation

## 2013-04-16 DIAGNOSIS — K429 Umbilical hernia without obstruction or gangrene: Secondary | ICD-10-CM | POA: Insufficient documentation

## 2013-04-16 DIAGNOSIS — K5000 Crohn's disease of small intestine without complications: Secondary | ICD-10-CM

## 2013-04-16 MED ORDER — IOHEXOL 300 MG/ML  SOLN
100.0000 mL | Freq: Once | INTRAMUSCULAR | Status: AC | PRN
  Administered 2013-04-16: 100 mL via INTRAVENOUS

## 2013-04-20 ENCOUNTER — Telehealth (INDEPENDENT_AMBULATORY_CARE_PROVIDER_SITE_OTHER): Payer: Self-pay | Admitting: *Deleted

## 2013-04-20 NOTE — Telephone Encounter (Signed)
I leave this message is for Bruce [name]

## 2013-04-20 NOTE — Telephone Encounter (Signed)
Patient called back Friday at 1:35.  Please call him after noon because he sleeps and gets up around that time.  [phone]

## 2013-04-22 NOTE — Telephone Encounter (Signed)
Message left at home

## 2013-04-22 NOTE — Telephone Encounter (Signed)
Message left at home.

## 2013-04-23 NOTE — Telephone Encounter (Signed)
Results given to wife. If patient has any questions, he should call our office. Wife states patient is pain free, no symptoms.

## 2013-06-28 ENCOUNTER — Emergency Department (HOSPITAL_COMMUNITY): Payer: 59

## 2013-06-28 ENCOUNTER — Inpatient Hospital Stay (HOSPITAL_COMMUNITY): Payer: 59

## 2013-06-28 ENCOUNTER — Inpatient Hospital Stay (HOSPITAL_COMMUNITY)
Admission: EM | Admit: 2013-06-28 | Discharge: 2013-06-30 | DRG: 389 | Disposition: A | Discharge status: Home / Self Care | Payer: 59 | Attending: Internal Medicine | Admitting: Internal Medicine

## 2013-06-28 ENCOUNTER — Encounter (HOSPITAL_COMMUNITY): Payer: Self-pay | Admitting: Emergency Medicine

## 2013-06-28 DIAGNOSIS — K429 Umbilical hernia without obstruction or gangrene: Secondary | ICD-10-CM | POA: Diagnosis present

## 2013-06-28 DIAGNOSIS — R351 Nocturia: Secondary | ICD-10-CM

## 2013-06-28 DIAGNOSIS — E039 Hypothyroidism, unspecified: Secondary | ICD-10-CM | POA: Diagnosis present

## 2013-06-28 DIAGNOSIS — S30861A Insect bite (nonvenomous) of abdominal wall, initial encounter: Secondary | ICD-10-CM

## 2013-06-28 DIAGNOSIS — E669 Obesity, unspecified: Secondary | ICD-10-CM

## 2013-06-28 DIAGNOSIS — Z79899 Other long term (current) drug therapy: Secondary | ICD-10-CM

## 2013-06-28 DIAGNOSIS — S30860A Insect bite (nonvenomous) of lower back and pelvis, initial encounter: Secondary | ICD-10-CM | POA: Diagnosis present

## 2013-06-28 DIAGNOSIS — N400 Enlarged prostate without lower urinary tract symptoms: Secondary | ICD-10-CM | POA: Diagnosis present

## 2013-06-28 DIAGNOSIS — K565 Intestinal adhesions [bands], unspecified as to partial versus complete obstruction: Principal | ICD-10-CM | POA: Diagnosis present

## 2013-06-28 DIAGNOSIS — R112 Nausea with vomiting, unspecified: Secondary | ICD-10-CM | POA: Diagnosis present

## 2013-06-28 DIAGNOSIS — W57XXXA Bitten or stung by nonvenomous insect and other nonvenomous arthropods, initial encounter: Secondary | ICD-10-CM

## 2013-06-28 DIAGNOSIS — I10 Essential (primary) hypertension: Secondary | ICD-10-CM | POA: Diagnosis present

## 2013-06-28 DIAGNOSIS — K5000 Crohn's disease of small intestine without complications: Secondary | ICD-10-CM | POA: Diagnosis present

## 2013-06-28 DIAGNOSIS — N401 Enlarged prostate with lower urinary tract symptoms: Secondary | ICD-10-CM | POA: Diagnosis present

## 2013-06-28 DIAGNOSIS — K5900 Constipation, unspecified: Secondary | ICD-10-CM | POA: Diagnosis present

## 2013-06-28 DIAGNOSIS — N138 Other obstructive and reflux uropathy: Secondary | ICD-10-CM | POA: Diagnosis present

## 2013-06-28 DIAGNOSIS — G4730 Sleep apnea, unspecified: Secondary | ICD-10-CM | POA: Diagnosis present

## 2013-06-28 DIAGNOSIS — K56609 Unspecified intestinal obstruction, unspecified as to partial versus complete obstruction: Secondary | ICD-10-CM

## 2013-06-28 DIAGNOSIS — Z7982 Long term (current) use of aspirin: Secondary | ICD-10-CM

## 2013-06-28 DIAGNOSIS — K5090 Crohn's disease, unspecified, without complications: Secondary | ICD-10-CM

## 2013-06-28 DIAGNOSIS — Z87891 Personal history of nicotine dependence: Secondary | ICD-10-CM

## 2013-06-28 LAB — CBC WITH DIFFERENTIAL/PLATELET
Basophils Absolute: 0 10*3/uL (ref 0.0–0.1)
Basophils Relative: 0 % (ref 0–1)
Eosinophils Absolute: 0.2 10*3/uL (ref 0.0–0.7)
Eosinophils Relative: 4 % (ref 0–5)
HCT: 38 % — ABNORMAL LOW (ref 39.0–52.0)
Hemoglobin: 12.2 g/dL — ABNORMAL LOW (ref 13.0–17.0)
Lymphocytes Relative: 26 % (ref 12–46)
Lymphs Abs: 1.7 10*3/uL (ref 0.7–4.0)
MCH: 26 pg (ref 26.0–34.0)
MCHC: 32.1 g/dL (ref 30.0–36.0)
MCV: 81 fL (ref 78.0–100.0)
Monocytes Absolute: 0.9 10*3/uL (ref 0.1–1.0)
Monocytes Relative: 14 % — ABNORMAL HIGH (ref 3–12)
Neutro Abs: 3.6 10*3/uL (ref 1.7–7.7)
Neutrophils Relative %: 56 % (ref 43–77)
Platelets: 445 10*3/uL — ABNORMAL HIGH (ref 150–400)
RBC: 4.69 MIL/uL (ref 4.22–5.81)
RDW: 15.5 % (ref 11.5–15.5)
WBC: 6.5 10*3/uL (ref 4.0–10.5)

## 2013-06-28 LAB — COMPREHENSIVE METABOLIC PANEL
ALT: 18 U/L (ref 0–53)
AST: 21 U/L (ref 0–37)
Albumin: 3.1 g/dL — ABNORMAL LOW (ref 3.5–5.2)
Alkaline Phosphatase: 102 U/L (ref 39–117)
BUN: 21 mg/dL (ref 6–23)
CO2: 21 mEq/L (ref 19–32)
Calcium: 8.6 mg/dL (ref 8.4–10.5)
Chloride: 101 mEq/L (ref 96–112)
Creatinine, Ser: 1.2 mg/dL (ref 0.50–1.35)
GFR calc Af Amer: 72 mL/min — ABNORMAL LOW (ref >90)
GFR calc non Af Amer: 62 mL/min — ABNORMAL LOW (ref >90)
Glucose, Bld: 108 mg/dL — ABNORMAL HIGH (ref 70–99)
Potassium: 4.1 mEq/L (ref 3.7–5.3)
Sodium: 133 mEq/L — ABNORMAL LOW (ref 137–147)
Total Bilirubin: 0.2 mg/dL — ABNORMAL LOW (ref 0.3–1.2)
Total Protein: 7 g/dL (ref 6.0–8.3)

## 2013-06-28 LAB — LIPASE, BLOOD: Lipase: 39 U/L (ref 11–59)

## 2013-06-28 LAB — I-STAT CG4 LACTIC ACID, ED: Lactic Acid, Venous: 1.87 mmol/L (ref 0.5–2.2)

## 2013-06-28 LAB — PROTIME-INR
INR: 1.07 (ref 0.00–1.49)
Prothrombin Time: 13.7 seconds (ref 11.6–15.2)

## 2013-06-28 LAB — APTT: aPTT: 28 seconds (ref 24–37)

## 2013-06-28 MED ORDER — ACETAMINOPHEN 650 MG RE SUPP: 650.0000 mg | Freq: Four times a day (QID) | RECTAL | Status: DC | PRN

## 2013-06-28 MED ORDER — SODIUM CHLORIDE 0.9 % IV SOLN
INTRAVENOUS | Status: AC
  Administered 2013-06-28: 21:00:00 via INTRAVENOUS

## 2013-06-28 MED ORDER — HYDROMORPHONE HCL PF 1 MG/ML IJ SOLN
0.5000 mg | INTRAMUSCULAR | Status: DC | PRN
  Administered 2013-06-29: via INTRAVENOUS
  Administered 2013-06-29: 0.5 mg via INTRAVENOUS
  Filled 2013-06-28 (×3): qty 1

## 2013-06-28 MED ORDER — LEVOTHYROXINE SODIUM 175 MCG PO TABS
175.0000 ug | ORAL_TABLET | Freq: Every day | ORAL | Status: DC
  Administered 2013-06-29 – 2013-06-30 (×2): 175 ug via ORAL
  Filled 2013-06-28 (×3): qty 1

## 2013-06-28 MED ORDER — ASPIRIN 81 MG PO CHEW
81.0000 mg | CHEWABLE_TABLET | Freq: Every day | ORAL | Status: DC
  Administered 2013-06-29 – 2013-06-30 (×2): 81 mg via ORAL
  Filled 2013-06-28 (×3): qty 1

## 2013-06-28 MED ORDER — LISINOPRIL 10 MG PO TABS
10.0000 mg | ORAL_TABLET | Freq: Every day | ORAL | Status: DC
  Administered 2013-06-29 – 2013-06-30 (×2): 10 mg via ORAL
  Filled 2013-06-28 (×3): qty 1

## 2013-06-28 MED ORDER — LIDOCAINE VISCOUS 2 % MT SOLN
15.0000 mL | Freq: Once | OROMUCOSAL | Status: AC
  Administered 2013-06-28: 2 mL via OROMUCOSAL
  Filled 2013-06-28: qty 15

## 2013-06-28 MED ORDER — ONDANSETRON HCL 4 MG/2ML IJ SOLN: 4.0000 mg | Freq: Three times a day (TID) | INTRAMUSCULAR | Status: AC | PRN

## 2013-06-28 MED ORDER — OXYMETAZOLINE HCL 0.05 % NA SOLN
1.0000 | Freq: Once | NASAL | Status: DC
  Filled 2013-06-28: qty 15

## 2013-06-28 MED ORDER — DUTASTERIDE 0.5 MG PO CAPS
0.5000 mg | ORAL_CAPSULE | Freq: Every day | ORAL | Status: DC
  Administered 2013-06-29 – 2013-06-30 (×2): 0.5 mg via ORAL
  Filled 2013-06-28 (×2): qty 1

## 2013-06-28 MED ORDER — MESALAMINE ER 250 MG PO CPCR
1000.0000 mg | ORAL_CAPSULE | Freq: Four times a day (QID) | ORAL | Status: DC
  Administered 2013-06-29 – 2013-06-30 (×6): 1000 mg via ORAL
  Filled 2013-06-28 (×9): qty 4

## 2013-06-28 MED ORDER — ACETAMINOPHEN 325 MG PO TABS: 650.0000 mg | ORAL_TABLET | Freq: Four times a day (QID) | ORAL | Status: DC | PRN

## 2013-06-28 MED ORDER — DOXYCYCLINE HYCLATE 100 MG PO TABS
100.0000 mg | ORAL_TABLET | Freq: Once | ORAL | Status: AC
  Administered 2013-06-28: 100 mg via ORAL
  Filled 2013-06-28: qty 1

## 2013-06-28 MED ORDER — ONDANSETRON HCL 4 MG/2ML IJ SOLN
4.0000 mg | Freq: Once | INTRAMUSCULAR | Status: AC
  Administered 2013-06-28: 4 mg via INTRAVENOUS
  Filled 2013-06-28: qty 2

## 2013-06-28 MED ORDER — HYDROMORPHONE HCL PF 1 MG/ML IJ SOLN
1.0000 mg | Freq: Once | INTRAMUSCULAR | Status: AC
  Administered 2013-06-28: 1 mg via INTRAVENOUS
  Filled 2013-06-28: qty 1

## 2013-06-28 NOTE — ED Provider Notes (Addendum)
CSN: [id number]     Arrival date & time 06/28/13  1734 History   First MD Initiated Contact with Patient 06/28/13 1750     Chief Complaint  Patient presents with  . Abdominal Pain     (Consider location/radiation/quality/duration/timing/severity/associated sxs/prior Treatment) HPI Comments: Patient with a history of Crohn's disease, multiple abdominal surgeries in the past, prior history of small bowel obstruction requiring NG tube. Patient reports he lifted something up on Thursday, felt immediate pain across his mid abdomen. His symptoms have been ongoing since then. Patient reports he still having watery stools but is not passing much flatus. Patient has focal tenderness around his umbilicus but pain diffusely in general. No radiation to his back. He denies syncope, endorses fatigue, persistent nausea. He reports vomiting seems to have decreased somewhat, but was unable to hold down Gatorade earlier today.  Patient is a 63 y.o. male presenting with abdominal pain. The history is provided by the patient, the spouse and medical records.  Abdominal Pain Pain location:  Generalized Pain quality: bloating and fullness   Pain radiates to:  Does not radiate Pain severity:  Severe Onset quality:  Sudden Duration:  3 days Timing:  Constant Progression:  Waxing and waning Chronicity:  New Relieved by:  Nothing Worsened by:  Palpation Ineffective treatments:  Not moving Associated symptoms: anorexia, belching, chills, diarrhea, fatigue, nausea and vomiting   Associated symptoms: no fever and no melena   Risk factors: being elderly, multiple surgeries and obesity     Past Medical History  Diagnosis Date  . Crohn's disease   . Hypertension   . Sleep apnea   . Prostate enlargement    Past Surgical History  Procedure Laterality Date  . Nasal sinus surgery    . Cholecystectomy    . Colonoscopy    . 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 Seventh nerve face    .  Colonoscopy  01/19/2011    Procedure: COLONOSCOPY;  Surgeon: Rogene Houston, MD;  Location: [facility];  Service: Endoscopy;  Laterality: N/A;  9:30 am  . Colon surgery     Family History  Problem Relation Age of Onset  . Colon cancer Mother   . Colon cancer Sister   . Healthy Sister   . Healthy Sister   . Crohn's disease Daughter   . Healthy Son   . Arthritis Other   . Heart disease Other   . Cancer Other     Colon, Uterine and Prostate Cancer   History  Substance Use Topics  . Smoking status: Former Smoker -- 4.00 packs/day for 30 years    Types: Cigarettes    Quit date: 12/24/2005  . Smokeless tobacco: Never Used  . Alcohol Use: No    Review of Systems  Constitutional: Positive for chills and fatigue. Negative for fever.  Gastrointestinal: Positive for nausea, vomiting, abdominal pain, diarrhea and anorexia. Negative for blood in stool and melena.  Skin: Positive for rash.  All other systems reviewed and are negative.     Allergies  Coconut flavor  Home Medications   Prior to Admission medications   Medication Sig Start Date End Date Taking? Authorizing Provider  aspirin 81 MG tablet Take 81 mg by mouth daily.     Yes Historical Provider, MD  dutasteride (AVODART) 0.5 MG capsule Take 0.5 mg by mouth daily.     Yes Historical Provider, MD  levothyroxine (SYNTHROID, LEVOTHROID) 175 MCG tablet Take 175 mcg  by mouth daily.     Yes Historical Provider, MD  lisinopril (PRINIVIL,ZESTRIL) 10 MG tablet Take 10 mg by mouth daily.   Yes Historical Provider, MD  mesalamine (PENTASA) 500 MG CR capsule Take 1,000 mg by mouth 4 (four) times daily.   Yes Historical Provider, MD  polyethylene glycol (MIRALAX / GLYCOLAX) packet Take 17 g by mouth daily.     Yes Historical Provider, MD   BP 110/78  Pulse 111  Temp(Src) 98.2 F (36.8 C) (Oral)  Resp 16  Ht 6' (1.829 m)  Wt 261 lb (118.389 kg)  BMI 35.39 kg/m2  SpO2 95% Physical Exam  Nursing note and vitals  reviewed. Constitutional: He is oriented to person, place, and time. He appears well-developed and well-nourished. No distress.  HENT:  Head: Normocephalic and atraumatic.  Eyes: Conjunctivae and EOM are normal. No scleral icterus.  Neck: Normal range of motion. Neck supple.  Cardiovascular: Regular rhythm and intact distal pulses.  Tachycardia present.   Pulmonary/Chest: Effort normal. No respiratory distress. He has no wheezes.  Abdominal: Soft. He exhibits distension. Bowel sounds are increased. There is tenderness in the periumbilical area. There is rebound and guarding. A hernia is present.  Musculoskeletal: He exhibits no edema.  Neurological: He is alert and oriented to person, place, and time. He exhibits normal muscle tone. Coordination normal.  Skin: Skin is warm. He is not diaphoretic.     Psychiatric: He has a normal mood and affect.    ED Course  FOREIGN BODY REMOVAL Date/Time: 06/28/2013 8:04 PM Performed by: Donzetta Matters. Authorized by: Donzetta Matters Consent: Verbal consent obtained. Risks and benefits: risks, benefits and alternatives were discussed Consent given by: patient Patient identity confirmed: verbally with patient Time out: Immediately prior to procedure a "time out" was called to verify the correct patient, procedure, equipment, support staff and site/side marked as required. Body area: skin Patient sedated: no Patient restrained: no Patient cooperative: yes Removal mechanism: forceps Depth: subcutaneous Complexity: simple Objects recovered: tick Post-procedure assessment: foreign body removed Patient tolerance: Patient tolerated the procedure well with no immediate complications.   (including critical care time) Labs Review Labs Reviewed  CBC WITH DIFFERENTIAL - Abnormal; Notable for the following:    Hemoglobin 12.2 (*)    HCT 38.0 (*)    Platelets 445 (*)    Monocytes Relative 14 (*)    All other components within normal limits   COMPREHENSIVE METABOLIC PANEL - Abnormal; Notable for the following:    Sodium 133 (*)    Glucose, Bld 108 (*)    Albumin 3.1 (*)    Total Bilirubin 0.2 (*)    GFR calc non Af Amer 62 (*)    GFR calc Af Amer 72 (*)    All other components within normal limits  LIPASE, BLOOD  APTT  PROTIME-INR  URINALYSIS, ROUTINE W REFLEX MICROSCOPIC  I-STAT CG4 LACTIC ACID, ED    Imaging Review Dg Abd Acute W/chest  06/28/2013   CLINICAL DATA:  Multiple hernias. Nausea and vomiting with diarrhea following lifting on Friday. History of prior abdominal surgery.  EXAM: ACUTE ABDOMEN SERIES (ABDOMEN 2 VIEW & CHEST 1 VIEW)  COMPARISON:  None.  FINDINGS: Small bowel obstruction is present with dilated loops of small bowel in the central and upper abdomen. Multiple air-fluid levels are present. No plain film evidence of free air. Maximal diameter of small bowel loops in the central abdomen is 45 mm.  There is no acute cardiopulmonary disease. Basilar atelectasis  is present. Cholecystectomy clips are present in the right upper quadrant.  IMPRESSION: High-grade small bowel obstruction, which may be adhesive given the prior history of abdominal surgery or associated with hernia.   Electronically Signed   By: Dereck Ligas M.D.   On: 06/28/2013 19:38     EKG Interpretation None     Room air saturation is 95% I interpret this to be adequate    8:05 PM Plain films shows SBO, matching my clinical suspicion.  Will place NGT, consult surgery and likely admit to hospitalist given age and comorbid conditions.  Tick removed by me.  Area of erythema noted.  Given a dose of doxycycline.    8:15 PM Dr. Charlies Silvers agrees to admit.  Dr. Grandville Silos will see pt in the ED   MDM   Final diagnoses:  SBO (small bowel obstruction)  Tick bite of abdomen     Patient with concerning abdominal exam with prior history of multiple surgeries, decrease in flatus although he is passing liquidy stool. I'm concerned for possible  bowel obstruction given his multiple surgeries as well as history of hernias. Patient does have some guarding on examination, plan to obtain labs, lactic acid and plain films. If plain films do not reveal a concrete etiology, will proceed to a CT scan. Patient will most likely require admission. He is mildly tachycardic likely due to dehydration. He will receive IV fluids, IV analgesics and antiemetics. We'll attempt to remove foreign body, i.e. tick from his left abdominal wall and treat with single dose doxycycline to cover for potential Lyme disease.     Saddie Benders. Dorna Mai, MD 06/28/13 2006  Saddie Benders. Dorna Mai, MD 06/28/13 2015

## 2013-06-28 NOTE — ED Notes (Signed)
Tick found on PT ABD on LT side. Removed per EDP [facility]

## 2013-06-28 NOTE — Consult Note (Signed)
Reason for Consult:Small bowel obstruction Referring Physician: [name] is an 63 y.o. male.  HPI: Patient was moving some furniture when he developed abdominal pain this afternoon. He had associated nausea. He came to the emergency department for further evaluation. Plain x-rays demonstrate small bowel obstruction. Additionally, he has a known history of umbilical hernia. On previous CT, this contained only fat. Of note, a tick was removed from his abdominal wall when he was in the emergency department.  Past Medical History  Diagnosis Date  . Crohn's disease   . Hypertension   . Sleep apnea   . Prostate enlargement     Past Surgical History  Procedure Laterality Date  . Nasal sinus surgery    . Cholecystectomy    . Colonoscopy    . 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 Seventh nerve face    . Colonoscopy  01/19/2011    Procedure: COLONOSCOPY;  Surgeon: Rogene Houston, MD;  Location: [facility];  Service: Endoscopy;  Laterality: N/A;  9:30 am  . Colon surgery      Family History  Problem Relation Age of Onset  . Colon cancer Mother   . Colon cancer Sister   . Healthy Sister   . Healthy Sister   . Crohn's disease Daughter   . Healthy Son   . Arthritis Other   . Heart disease Other   . Cancer Other     Colon, Uterine and Prostate Cancer    Social History:  reports that he quit smoking about 7 years ago. His smoking use included Cigarettes. He has a 120 pack-year smoking history. He has never used smokeless tobacco. He reports that he does not drink alcohol or use illicit drugs.  Allergies:  Allergies  Allergen Reactions  . Coconut Flavor     Medications:  Scheduled: . sodium chloride   Intravenous STAT  . [START ON 06/29/2013] aspirin  81 mg Oral Daily  . [START ON 06/29/2013] dutasteride  0.5 mg Oral Daily  . [START ON 06/29/2013] levothyroxine  175 mcg Oral QAC breakfast  . [START ON 06/29/2013] lisinopril  10 mg Oral Daily   . mesalamine  1,000 mg Oral QID   Continuous:   Results for orders placed during the hospital encounter of 06/28/13 (from the past 48 hour(s))  CBC WITH DIFFERENTIAL     Status: Abnormal   Collection Time    06/28/13  6:38 PM      Result Value Ref Range   WBC 6.5  4.0 - 10.5 K/uL   RBC 4.69  4.22 - 5.81 MIL/uL   Hemoglobin 12.2 (*) 13.0 - 17.0 g/dL   HCT 38.0 (*) 39.0 - 52.0 %   MCV 81.0  78.0 - 100.0 fL   MCH 26.0  26.0 - 34.0 pg   MCHC 32.1  30.0 - 36.0 g/dL   RDW 15.5  11.5 - 15.5 %   Platelets 445 (*) 150 - 400 K/uL   Neutrophils Relative % 56  43 - 77 %   Neutro Abs 3.6  1.7 - 7.7 K/uL   Lymphocytes Relative 26  12 - 46 %   Lymphs Abs 1.7  0.7 - 4.0 K/uL   Monocytes Relative 14 (*) 3 - 12 %   Monocytes Absolute 0.9  0.1 - 1.0 K/uL   Eosinophils Relative 4  0 - 5 %   Eosinophils Absolute 0.2  0.0 - 0.7 K/uL  Basophils Relative 0  0 - 1 %   Basophils Absolute 0.0  0.0 - 0.1 K/uL  COMPREHENSIVE METABOLIC PANEL     Status: Abnormal   Collection Time    06/28/13  6:38 PM      Result Value Ref Range   Sodium 133 (*) 137 - 147 mEq/L   Potassium 4.1  3.7 - 5.3 mEq/L   Chloride 101  96 - 112 mEq/L   CO2 21  19 - 32 mEq/L   Glucose, Bld 108 (*) 70 - 99 mg/dL   BUN 21  6 - 23 mg/dL   Creatinine, Ser 1.20  0.50 - 1.35 mg/dL   Calcium 8.6  8.4 - 10.5 mg/dL   Total Protein 7.0  6.0 - 8.3 g/dL   Albumin 3.1 (*) 3.5 - 5.2 g/dL   AST 21  0 - 37 U/L   ALT 18  0 - 53 U/L   Alkaline Phosphatase 102  39 - 117 U/L   Total Bilirubin 0.2 (*) 0.3 - 1.2 mg/dL   GFR calc non Af Amer 62 (*) >90 mL/min   GFR calc Af Amer 72 (*) >90 mL/min   Comment: (NOTE)     The eGFR has been calculated using the CKD EPI equation.     This calculation has not been validated in all clinical situations.     eGFR's persistently <90 mL/min signify possible Chronic Kidney     Disease.  LIPASE, BLOOD     Status: None   Collection Time    06/28/13  6:38 PM      Result Value Ref Range   Lipase 39  11 -  59 U/L  APTT     Status: None   Collection Time    06/28/13  6:38 PM      Result Value Ref Range   aPTT 28  24 - 37 seconds  PROTIME-INR     Status: None   Collection Time    06/28/13  6:38 PM      Result Value Ref Range   Prothrombin Time 13.7  11.6 - 15.2 seconds   INR 1.07  0.00 - 1.49  I-STAT CG4 LACTIC ACID, ED     Status: None   Collection Time    06/28/13  6:57 PM      Result Value Ref Range   Lactic Acid, Venous 1.87  0.5 - 2.2 mmol/L    Dg Abd Acute W/chest  06/28/2013   CLINICAL DATA:  Multiple hernias. Nausea and vomiting with diarrhea following lifting on Friday. History of prior abdominal surgery.  EXAM: ACUTE ABDOMEN SERIES (ABDOMEN 2 VIEW & CHEST 1 VIEW)  COMPARISON:  None.  FINDINGS: Small bowel obstruction is present with dilated loops of small bowel in the central and upper abdomen. Multiple air-fluid levels are present. No plain film evidence of free air. Maximal diameter of small bowel loops in the central abdomen is 45 mm.  There is no acute cardiopulmonary disease. Basilar atelectasis is present. Cholecystectomy clips are present in the right upper quadrant.  IMPRESSION: High-grade small bowel obstruction, which may be adhesive given the prior history of abdominal surgery or associated with hernia.   Electronically Signed   By: Dereck Ligas M.D.   On: 06/28/2013 19:38   Dg Abd Portable 1v  06/28/2013   CLINICAL DATA:  Assess NG tube placement  EXAM: PORTABLE ABDOMEN - 1 VIEW  COMPARISON:  DG ABD ACUTE W/CHEST dated 06/28/2013  FINDINGS: The tip and proximal  port of the nasogastric tube lie below the expected location of the GE junction. There remain loops of mildly distended gas-filled small bowel. There are loops of normal calibered, gas-filled large bowel present. There are surgical clips in the gallbladder fossa. A surgical clip is noted in the right mid abdomen.  IMPRESSION: The patient has undergone placement of a nasogastric tube with adequate positioning  radiographically.   Electronically Signed   By: Iden  Martinique   On: 06/28/2013 21:08    Review of Systems  Constitutional: Negative for fever and chills.  HENT: Negative.   Eyes: Negative.   Respiratory: Negative.   Cardiovascular: Negative.   Gastrointestinal: Positive for nausea and abdominal pain.  Genitourinary: Negative.   Musculoskeletal: Negative.   Skin:       Tick on abdominal wall earlier today  Neurological: Negative.   Endo/Heme/Allergies: Negative.   Psychiatric/Behavioral: Negative.    Blood pressure 108/76, pulse 96, temperature 97.5 F (36.4 C), temperature source Oral, resp. rate 16, height 6' (1.829 m), weight 251 lb 6.4 oz (114.034 kg), SpO2 96.00%. Physical Exam  Constitutional: He is oriented to person, place, and time. He appears well-developed and well-nourished. No distress.  HENT:  Head: Normocephalic and atraumatic.  Mouth/Throat: Oropharynx is clear and moist. No oropharyngeal exudate.  Eyes: EOM are normal. Pupils are equal, round, and reactive to light.  Neck: Normal range of motion. Neck supple. No tracheal deviation present.  Cardiovascular: Normal rate, normal heart sounds and intact distal pulses.   Respiratory: Effort normal and breath sounds normal. No stridor. No respiratory distress. He has no wheezes. He has no rales.  GI: Soft. He exhibits distension. There is tenderness. There is no rebound and no guarding.  Tenderness at the umbilical hernia but this was easily reducible, no generalized tenderness, no other localized tenderness, positive bowel sounds, bilateral subcostal scars  Musculoskeletal: Normal range of motion. He exhibits no tenderness.  Neurological: He is alert and oriented to person, place, and time. He exhibits normal muscle tone.  Skin: Skin is warm.  Psychiatric: He has a normal mood and affect.    Assessment/Plan: Small bowel obstruction likely due to adhesions. Umbilical hernia reduces easily. Agree with medical admission  and nasogastric tube, IV fluids. We'll follow closely. If he does not improve, may benefit from CT scan.  Zenovia Jarred 06/28/2013, 10:03 PM

## 2013-06-28 NOTE — ED Notes (Signed)
Ghim MD at bedside.

## 2013-06-28 NOTE — H&P (Signed)
[facility] History and Physical  Bruce [name] EQA:[id number] DOB: 1950-07-27 DOA: 06/28/2013  Referring physician: ER physician PCP: [name], MD   Chief Complaint: abdominal pain, nausea, vomiting  HPI:  63 year old male with past medical history of Crohn's disease, hypertension, hypothyroidism who presented to [facility] ED 06/28/2013 with complaints of worsening mid abdominal pain, associated nausea, vomiting and diarrhea over past 2 days prior to this admission. Patient reports subjective fevers, chills and fatigue. No reports of blood ins tool. No reports of blood in emesis. He reported mid abdominal pain, 6/10 in intensity, non radiating. Pain not relieved with rest. Pain is intermittent and he reported pain relief with analgesia given in ED. No complaints of chest pain, shortness of breath, palpitations.   In ED, vitals are stable with BP of 110/78, HR 111, T max 98.2 F and oxygen saturation of 95% on room air. His blood work was significant for hemoglobin of 12.2, sodium of 133. Abdominal x ray showed high grade SBO. In addition patient had area of erythema/tick on left lower abdominal region which was removed and he was given 1 dose of doxycycline.  Assessment and Plan:  Principal Problem:   SBO (small bowel obstruction) - secondary to multiple abdominal surgeries, adhesions - follow up on CT abdomen  - we will continue IV fluids, antiemetics and analgesia as needed - appreciate surgery consult and recommendations - NG tube to be placed in ED - Keep NPO except for meds Active Problems:   Nausea and vomiting, abdominal pain - secondary to SBO - management as above   HTN (hypertension) - continue lisinopril   Hypothyroidism - continue synthroid   Crohn disease - continue pentasa   BPH (benign prostatic hypertrophy) - continue avodart   Unspecified constipation - has diarrhea so stop miralax   Radiological Exams on Admission: Dg Abd Acute W/chest 06/28/2013    IMPRSSION: High-grade small bowel obstruction, which may be adhesive given the prior history of abdominal surgery or associated with hernia.     Code Status: Full Family Communication: Pt at bedside Disposition Plan: Admit for further evaluation  Robbie Lis, MD  [facility] Pager [phone]  Review of Systems:  Constitutional: Negative for malaise/fatigue. Negative for diaphoresis.  HENT: Negative for hearing loss, ear pain, nosebleeds, congestion, sore throat, neck pain, tinnitus and ear discharge.   Eyes: Negative for blurred vision, double vision, photophobia, pain, discharge and redness.  Respiratory: Negative for cough, hemoptysis, sputum production, shortness of breath, wheezing and stridor.   Cardiovascular: Negative for chest pain, palpitations, orthopnea, claudication and leg swelling.  Gastrointestinal: per HPI  Genitourinary: Negative for dysuria, urgency, frequency, hematuria and flank pain.  Musculoskeletal: Negative for myalgias, back pain, joint pain and falls.  Skin: Negative for itching and rash.  Neurological: Negative for dizziness and weakness. Negative for tingling, tremors, sensory change, speech change, focal weakness, loss of consciousness and headaches.  Endo/Heme/Allergies: Negative for environmental allergies and polydipsia. Does not bruise/bleed easily.  Psychiatric/Behavioral: Negative for suicidal ideas. The patient is not nervous/anxious.      Past Medical History  Diagnosis Date  . Crohn's disease   . Hypertension   . Sleep apnea   . Prostate enlargement    Past Surgical History  Procedure Laterality Date  . Nasal sinus surgery    . Cholecystectomy    . Colonoscopy    . 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 Seventh nerve face    .  Colonoscopy  01/19/2011    Procedure: COLONOSCOPY;  Surgeon: Rogene Houston, MD;  Location: [facility];  Service: Endoscopy;  Laterality: N/A;  9:30 am  . Colon surgery     Social History:   reports that he quit smoking about 7 years ago. His smoking use included Cigarettes. He has a 120 pack-year smoking history. He has never used smokeless tobacco. He reports that he does not drink alcohol or use illicit drugs.  Allergies  Allergen Reactions  . Coconut Flavor     Family History  Problem Relation Age of Onset  . Colon cancer Mother   . Colon cancer Sister   . Healthy Sister   . Healthy Sister   . Crohn's disease Daughter   . Healthy Son   . Arthritis Other   . Heart disease Other   . Cancer Other     Colon, Uterine and Prostate Cancer     Prior to Admission medications   Medication Sig Start Date End Date Taking? Authorizing Provider  aspirin 81 MG tablet Take 81 mg by mouth daily.     Yes Historical Provider, MD  dutasteride (AVODART) 0.5 MG capsule Take 0.5 mg by mouth daily.     Yes Historical Provider, MD  levothyroxine (SYNTHROID) 175 MCG tablet Take 175 mcg by mouth daily.     Yes Historical Provider, MD  lisinopril (PRINIVIL,ZESTRIL) 10 MG tablet Take 10 mg by mouth daily.   Yes Historical Provider, MD  mesalamine (PENTASA) 500 MG CR capsule Take 1,000 mg by mouth 4 (four) times daily.   Yes Historical Provider, MD  polyethylene glycol (MIRALAX / GLYCOLAX) packet Take 17 g by mouth daily.     Yes Historical Provider, MD   Physical Exam: Filed Vitals:   06/28/13 1739  BP: 110/78  Pulse: 111  Temp: 98.2 F (36.8 C)  TempSrc: Oral  Resp: 16  Height: 6' (1.829 m)  Weight: 118.389 kg (261 lb)  SpO2: 95%    Physical Exam  Constitutional: Appears ill but not in acute distress.  HENT: Normocephalic. External right and left ear normal. No tonsillar erythema or exudates.  Eyes: Conjunctivae and EOM are normal. PERRLA, no scleral icterus.  Neck: Normal ROM. Neck supple. No JVD. No tracheal deviation. No thyromegaly.  CVS: tachycardic, S1/S2 appreciated  Pulmonary: Effort and breath sounds normal, no stridor, rhonchi, wheezes, rales.  Abdominal: abdomen  distended, tender across mid abdomen, (+) BS present Musculoskeletal: Normal range of motion. No edema and no tenderness.  Lymphadenopathy: No lymphadenopathy noted, cervical, inguinal. Neuro: Alert. No focal neurologic deficits. Skin: Skin is warm and dry. Psychiatric: Normal mood and affect.   Labs on Admission:  Basic Metabolic Panel:  Recent Labs Lab 06/28/13 1838  NA 133*  K 4.1  CL 101  CO2 21  GLUCOSE 108*  BUN 21  CREATININE 1.20  CALCIUM 8.6   Liver Function Tests:  Recent Labs Lab 06/28/13 1838  AST 21  ALT 18  ALKPHOS 102  BILITOT 0.2*  PROT 7.0  ALBUMIN 3.1*    Recent Labs Lab 06/28/13 1838  LIPASE 39   No results found for this basename: AMMONIA,  in the last 168 hours CBC:  Recent Labs Lab 06/28/13 1838  WBC 6.5  NEUTROABS 3.6  HGB 12.2*  HCT 38.0*  MCV 81.0  PLT 445*   Cardiac Enzymes: No results found for this basename: CKTOTAL, CKMB, CKMBINDEX, TROPONINI,  in the last 168 hours BNP: No components found with this basename: POCBNP,  CBG: No results found for this basename: GLUCAP,  in the last 168 hours  If 7PM-7AM, please contact night-coverage [email or web address] Password [facility] 06/28/2013, 8:18 PM

## 2013-06-28 NOTE — ED Notes (Signed)
Pt presents to department for evaluation of abdominal pain. States he was lifting heavy boxes on Friday and now states burning pain across abdomen. States history of hernia. 6/10 pain at the time. Pt is alert and oriented x4.

## 2013-06-29 ENCOUNTER — Inpatient Hospital Stay (HOSPITAL_COMMUNITY): Payer: 59

## 2013-06-29 DIAGNOSIS — N400 Enlarged prostate without lower urinary tract symptoms: Secondary | ICD-10-CM

## 2013-06-29 LAB — URINALYSIS, ROUTINE W REFLEX MICROSCOPIC
Glucose, UA: NEGATIVE mg/dL
Hgb urine dipstick: NEGATIVE
Ketones, ur: 15 mg/dL — AB
Leukocytes, UA: NEGATIVE
Nitrite: NEGATIVE
Protein, ur: NEGATIVE mg/dL
Specific Gravity, Urine: 1.029 (ref 1.005–1.030)
Urobilinogen, UA: 0.2 mg/dL (ref 0.0–1.0)
pH: 5 (ref 5.0–8.0)

## 2013-06-29 LAB — CBC
HCT: 35.5 % — ABNORMAL LOW (ref 39.0–52.0)
Hemoglobin: 10.9 g/dL — ABNORMAL LOW (ref 13.0–17.0)
MCH: 25.3 pg — ABNORMAL LOW (ref 26.0–34.0)
MCHC: 30.7 g/dL (ref 30.0–36.0)
MCV: 82.4 fL (ref 78.0–100.0)
Platelets: 397 10*3/uL (ref 150–400)
RBC: 4.31 MIL/uL (ref 4.22–5.81)
RDW: 15.6 % — ABNORMAL HIGH (ref 11.5–15.5)
WBC: 7.1 10*3/uL (ref 4.0–10.5)

## 2013-06-29 LAB — COMPREHENSIVE METABOLIC PANEL
ALT: 15 U/L (ref 0–53)
AST: 16 U/L (ref 0–37)
Albumin: 2.9 g/dL — ABNORMAL LOW (ref 3.5–5.2)
Alkaline Phosphatase: 88 U/L (ref 39–117)
BUN: 20 mg/dL (ref 6–23)
CO2: 20 mEq/L (ref 19–32)
Calcium: 8.1 mg/dL — ABNORMAL LOW (ref 8.4–10.5)
Chloride: 104 mEq/L (ref 96–112)
Creatinine, Ser: 1.11 mg/dL (ref 0.50–1.35)
GFR calc Af Amer: 79 mL/min — ABNORMAL LOW (ref >90)
GFR calc non Af Amer: 68 mL/min — ABNORMAL LOW (ref >90)
Glucose, Bld: 107 mg/dL — ABNORMAL HIGH (ref 70–99)
Potassium: 3.7 mEq/L (ref 3.7–5.3)
Sodium: 137 mEq/L (ref 137–147)
Total Bilirubin: 0.3 mg/dL (ref 0.3–1.2)
Total Protein: 6.4 g/dL (ref 6.0–8.3)

## 2013-06-29 LAB — GLUCOSE, CAPILLARY: Glucose-Capillary: 95 mg/dL (ref 70–99)

## 2013-06-29 LAB — PHOSPHORUS: Phosphorus: 2.5 mg/dL (ref 2.3–4.6)

## 2013-06-29 LAB — TSH: TSH: 0.813 u[IU]/mL (ref 0.350–4.500)

## 2013-06-29 LAB — MAGNESIUM: Magnesium: 1.9 mg/dL (ref 1.5–2.5)

## 2013-06-29 NOTE — Progress Notes (Signed)
PROGRESS NOTE  Bruce [name] OJJ:[id number] DOB: [birth year] DOA: 06/28/2013 PCP: [name], MD  Assessment/Plan: SBO likely 2/2 adhesions  Improving, await AXR, continue NGT to LWIS for now, IVF  Mobilize -appreciate surgery Nausea and vomiting, abdominal pain  - secondary to SBO  - management as above   HTN (hypertension)  - continue lisinopril   Hypothyroidism  - continue synthroid   Crohn disease  - continue pentasa   BPH (benign prostatic hypertrophy)  - continue avodart   Unspecified constipation  - has diarrhea so stop miralax      Code Status: full Family Communication: patient Disposition Plan:    Consultants:  surgery  Procedures:  NG tube   HPI/Subjective: Diarrhea and flatus  Objective: Filed Vitals:   06/29/13 0801  BP: 117/76  Pulse: 97  Temp:   Resp: 18    Intake/Output Summary (Last 24 hours) at 06/29/13 0923 Last data filed at 06/29/13 0900  Gross per 24 hour  Intake   1081 ml  Output    375 ml  Net    706 ml   Filed Weights   06/28/13 1739 06/28/13 2108  Weight: 118.389 kg (261 lb) 114.034 kg (251 lb 6.4 oz)    Exam:  General: Pt awake/alert/oriented x4 in no acute distress  Chest: cta No chest wall pain w good excursion  CV: Pulses intact. Regular rhythm  MS: No edema Abdomen: Soft. Nondistended. Tender on left side.    Data Reviewed: Basic Metabolic Panel:  Recent Labs Lab 06/28/13 1838 06/29/13 0545  NA 133* 137  K 4.1 3.7  CL 101 104  CO2 21 20  GLUCOSE 108* 107*  BUN 21 20  CREATININE 1.20 1.11  CALCIUM 8.6 8.1*  MG  --  1.9  PHOS  --  2.5   Liver Function Tests:  Recent Labs Lab 06/28/13 1838 06/29/13 0545  AST 21 16  ALT 18 15  ALKPHOS 102 88  BILITOT 0.2* 0.3  PROT 7.0 6.4  ALBUMIN 3.1* 2.9*    Recent Labs Lab 06/28/13 1838  LIPASE 39   No results found for this basename: AMMONIA,  in the last 168 hours CBC:  Recent Labs Lab 06/28/13 1838 06/29/13 0545  WBC 6.5 7.1    NEUTROABS 3.6  --   HGB 12.2* 10.9*  HCT 38.0* 35.5*  MCV 81.0 82.4  PLT 445* 397   Cardiac Enzymes: No results found for this basename: CKTOTAL, CKMB, CKMBINDEX, TROPONINI,  in the last 168 hours BNP (last 3 results) No results found for this basename: PROBNP,  in the last 8760 hours CBG:  Recent Labs Lab 06/29/13 0901  GLUCAP 95    No results found for this or any previous visit (from the past 240 hour(s)).   Studies: Dg Abd 2 Views  06/29/2013   CLINICAL DATA:  History of small bowel obstruction. Abdominal distention. Weakness.  EXAM: ABDOMEN - 2 VIEW  COMPARISON:  Abdominal radiograph 06/28/2013.  FINDINGS: A nasogastric tube is seen in the proximal stomach, with side port just distal to the gastroesophageal junction. Gas is noted throughout the colon to the level of the proximal rectum. No pathologic distention of small bowel. There are some prominent gas-filled loops of small bowel in the central abdomen and left upper quadrant measuring up to 2.9 cm in diameter. A few small air-fluid levels are noted on the upright projection. No pneumoperitoneum. Surgical clips project over the right upper quadrant of the abdomen, compatible with prior cholecystectomy.  IMPRESSION:  1. Nonspecific, nonobstructive bowel gas pattern, as above. 2. Postoperative changes and support apparatus, as above.   Electronically Signed   By: Vinnie Langton M.D.   On: 06/29/2013 08:31   Dg Abd Acute W/chest  06/28/2013   CLINICAL DATA:  Multiple hernias. Nausea and vomiting with diarrhea following lifting on Friday. History of prior abdominal surgery.  EXAM: ACUTE ABDOMEN SERIES (ABDOMEN 2 VIEW & CHEST 1 VIEW)  COMPARISON:  None.  FINDINGS: Small bowel obstruction is present with dilated loops of small bowel in the central and upper abdomen. Multiple air-fluid levels are present. No plain film evidence of free air. Maximal diameter of small bowel loops in the central abdomen is 45 mm.  There is no acute  cardiopulmonary disease. Basilar atelectasis is present. Cholecystectomy clips are present in the right upper quadrant.  IMPRESSION: High-grade small bowel obstruction, which may be adhesive given the prior history of abdominal surgery or associated with hernia.   Electronically Signed   By: Dereck Ligas M.D.   On: 06/28/2013 19:38   Dg Abd Portable 1v  06/28/2013   CLINICAL DATA:  Assess NG tube placement  EXAM: PORTABLE ABDOMEN - 1 VIEW  COMPARISON:  DG ABD ACUTE W/CHEST dated 06/28/2013  FINDINGS: The tip and proximal port of the nasogastric tube lie below the expected location of the GE junction. There remain loops of mildly distended gas-filled small bowel. There are loops of normal calibered, gas-filled large bowel present. There are surgical clips in the gallbladder fossa. A surgical clip is noted in the right mid abdomen.  IMPRESSION: The patient has undergone placement of a nasogastric tube with adequate positioning radiographically.   Electronically Signed   By: Morrison  Martinique   On: 06/28/2013 21:08    Scheduled Meds: . sodium chloride   Intravenous STAT  . aspirin  81 mg Oral Daily  . dutasteride  0.5 mg Oral Daily  . levothyroxine  175 mcg Oral QAC breakfast  . lisinopril  10 mg Oral Daily  . mesalamine  1,000 mg Oral QID   Continuous Infusions:  Antibiotics Given (last 72 hours)   Date/Time Action Medication Dose   06/28/13 1850 Given   doxycycline (VIBRA-TABS) tablet 100 mg 100 mg      Principal Problem:   SBO (small bowel obstruction) Active Problems:   HTN (hypertension)   Hypothyroidism   Crohn disease   BPH (benign prostatic hypertrophy)   Unspecified constipation   Nausea and vomiting    Time spent: 35 min    [facility] Pager [phone]. If 7PM-7AM, please contact night-coverage at [email or web address], password [facility] 06/29/2013, 9:23 AM  LOS: 1 day

## 2013-06-29 NOTE — Progress Notes (Signed)
Patient ID: Bruce [name], male   DOB: [birth year], 63 y.o.   MRN: [id number]  Subjective: Had a BM and started passing flatus this AM.  No n/v.  137m/ out of NGT, not functioning.    Objective:  Vital signs:  Filed Vitals:   06/28/13 1739 06/28/13 2015 06/28/13 2108 06/29/13 0559  BP: 110/78 111/81 108/76 106/72  Pulse: 111 98 96 101  Temp: 98.2 F (36.8 C)  97.5 F (36.4 C) 97.3 F (36.3 C)  TempSrc: Oral  Oral Oral  Resp: 16  16 16   Height: 6' (1.829 m)  6' (1.829 m)   Weight: 261 lb (118.389 kg)  251 lb 6.4 oz (114.034 kg)   SpO2: 95% 97% 96% 94%    Last BM Date: 06/28/13  Intake/Output   Yesterday:  04/26 0701 - 04/27 0700 In: -  Out: 375 [Urine:250; Emesis/NG output:125] This shift:    I/O last 3 completed shifts: In: -  Out: 375 [Urine:250; Emesis/NG output:125]    Physical Exam: General: Pt awake/alert/oriented x4 in no acute distress Chest: cta No chest wall pain w good excursion CV:  Pulses intact.  Regular rhythm MS: Normal AROM mjr joints.  No obvious deformity Abdomen: Soft.  Nondistended.  ttp lower abdomen.  No evidence of peritonitis.  No incarcerated hernias.   Problem List:   Principal Problem:   SBO (small bowel obstruction) Active Problems:   HTN (hypertension)   Hypothyroidism   Crohn disease   BPH (benign prostatic hypertrophy)   Unspecified constipation   Nausea and vomiting    Results:   Labs: Results for orders placed during the hospital encounter of 06/28/13 (from the past 48 hour(s))  CBC WITH DIFFERENTIAL     Status: Abnormal   Collection Time    06/28/13  6:38 PM      Result Value Ref Range   WBC 6.5  4.0 - 10.5 K/uL   RBC 4.69  4.22 - 5.81 MIL/uL   Hemoglobin 12.2 (*) 13.0 - 17.0 g/dL   HCT 38.0 (*) 39.0 - 52.0 %   MCV 81.0  78.0 - 100.0 fL   MCH 26.0  26.0 - 34.0 pg   MCHC 32.1  30.0 - 36.0 g/dL   RDW 15.5  11.5 - 15.5 %   Platelets 445 (*) 150 - 400 K/uL   Neutrophils Relative % 56  43 - 77 %   Neutro Abs  3.6  1.7 - 7.7 K/uL   Lymphocytes Relative 26  12 - 46 %   Lymphs Abs 1.7  0.7 - 4.0 K/uL   Monocytes Relative 14 (*) 3 - 12 %   Monocytes Absolute 0.9  0.1 - 1.0 K/uL   Eosinophils Relative 4  0 - 5 %   Eosinophils Absolute 0.2  0.0 - 0.7 K/uL   Basophils Relative 0  0 - 1 %   Basophils Absolute 0.0  0.0 - 0.1 K/uL  COMPREHENSIVE METABOLIC PANEL     Status: Abnormal   Collection Time    06/28/13  6:38 PM      Result Value Ref Range   Sodium 133 (*) 137 - 147 mEq/L   Potassium 4.1  3.7 - 5.3 mEq/L   Chloride 101  96 - 112 mEq/L   CO2 21  19 - 32 mEq/L   Glucose, Bld 108 (*) 70 - 99 mg/dL   BUN 21  6 - 23 mg/dL   Creatinine, Ser 1.20  0.50 - 1.35 mg/dL   Calcium  8.6  8.4 - 10.5 mg/dL   Total Protein 7.0  6.0 - 8.3 g/dL   Albumin 3.1 (*) 3.5 - 5.2 g/dL   AST 21  0 - 37 U/L   ALT 18  0 - 53 U/L   Alkaline Phosphatase 102  39 - 117 U/L   Total Bilirubin 0.2 (*) 0.3 - 1.2 mg/dL   GFR calc non Af Amer 62 (*) >90 mL/min   GFR calc Af Amer 72 (*) >90 mL/min   Comment: (NOTE)     The eGFR has been calculated using the CKD EPI equation.     This calculation has not been validated in all clinical situations.     eGFR's persistently <90 mL/min signify possible Chronic Kidney     Disease.  LIPASE, BLOOD     Status: None   Collection Time    06/28/13  6:38 PM      Result Value Ref Range   Lipase 39  11 - 59 U/L  APTT     Status: None   Collection Time    06/28/13  6:38 PM      Result Value Ref Range   aPTT 28  24 - 37 seconds  PROTIME-INR     Status: None   Collection Time    06/28/13  6:38 PM      Result Value Ref Range   Prothrombin Time 13.7  11.6 - 15.2 seconds   INR 1.07  0.00 - 1.49  I-STAT CG4 LACTIC ACID, ED     Status: None   Collection Time    06/28/13  6:57 PM      Result Value Ref Range   Lactic Acid, Venous 1.87  0.5 - 2.2 mmol/L  URINALYSIS, ROUTINE W REFLEX MICROSCOPIC     Status: Abnormal   Collection Time    06/29/13  2:48 AM      Result Value Ref Range    Color, Urine YELLOW  YELLOW   APPearance CLEAR  CLEAR   Specific Gravity, Urine 1.029  1.005 - 1.030   pH 5.0  5.0 - 8.0   Glucose, UA NEGATIVE  NEGATIVE mg/dL   Hgb urine dipstick NEGATIVE  NEGATIVE   Bilirubin Urine SMALL (*) NEGATIVE   Ketones, ur 15 (*) NEGATIVE mg/dL   Protein, ur NEGATIVE  NEGATIVE mg/dL   Urobilinogen, UA 0.2  0.0 - 1.0 mg/dL   Nitrite NEGATIVE  NEGATIVE   Leukocytes, UA NEGATIVE  NEGATIVE   Comment: MICROSCOPIC NOT DONE ON URINES WITH NEGATIVE PROTEIN, BLOOD, LEUKOCYTES, NITRITE, OR GLUCOSE <1000 mg/dL.  COMPREHENSIVE METABOLIC PANEL     Status: Abnormal   Collection Time    06/29/13  5:45 AM      Result Value Ref Range   Sodium 137  137 - 147 mEq/L   Potassium 3.7  3.7 - 5.3 mEq/L   Chloride 104  96 - 112 mEq/L   CO2 20  19 - 32 mEq/L   Glucose, Bld 107 (*) 70 - 99 mg/dL   BUN 20  6 - 23 mg/dL   Creatinine, Ser 1.11  0.50 - 1.35 mg/dL   Calcium 8.1 (*) 8.4 - 10.5 mg/dL   Total Protein 6.4  6.0 - 8.3 g/dL   Albumin 2.9 (*) 3.5 - 5.2 g/dL   AST 16  0 - 37 U/L   ALT 15  0 - 53 U/L   Alkaline Phosphatase 88  39 - 117 U/L   Total Bilirubin 0.3  0.3 -  1.2 mg/dL   GFR calc non Af Amer 68 (*) >90 mL/min   GFR calc Af Amer 79 (*) >90 mL/min   Comment: (NOTE)     The eGFR has been calculated using the CKD EPI equation.     This calculation has not been validated in all clinical situations.     eGFR's persistently <90 mL/min signify possible Chronic Kidney     Disease.  CBC     Status: Abnormal   Collection Time    06/29/13  5:45 AM      Result Value Ref Range   WBC 7.1  4.0 - 10.5 K/uL   RBC 4.31  4.22 - 5.81 MIL/uL   Hemoglobin 10.9 (*) 13.0 - 17.0 g/dL   HCT 35.5 (*) 39.0 - 52.0 %   MCV 82.4  78.0 - 100.0 fL   MCH 25.3 (*) 26.0 - 34.0 pg   MCHC 30.7  30.0 - 36.0 g/dL   RDW 15.6 (*) 11.5 - 15.5 %   Platelets 397  150 - 400 K/uL  MAGNESIUM     Status: None   Collection Time    06/29/13  5:45 AM      Result Value Ref Range   Magnesium 1.9  1.5 -  2.5 mg/dL  PHOSPHORUS     Status: None   Collection Time    06/29/13  5:45 AM      Result Value Ref Range   Phosphorus 2.5  2.3 - 4.6 mg/dL  TSH     Status: None   Collection Time    06/29/13  5:45 AM      Result Value Ref Range   TSH 0.813  0.350 - 4.500 uIU/mL   Comment: Please note change in reference range.    Imaging / Studies: Dg Abd Acute W/chest  06/28/2013   CLINICAL DATA:  Multiple hernias. Nausea and vomiting with diarrhea following lifting on Friday. History of prior abdominal surgery.  EXAM: ACUTE ABDOMEN SERIES (ABDOMEN 2 VIEW & CHEST 1 VIEW)  COMPARISON:  None.  FINDINGS: Small bowel obstruction is present with dilated loops of small bowel in the central and upper abdomen. Multiple air-fluid levels are present. No plain film evidence of free air. Maximal diameter of small bowel loops in the central abdomen is 45 mm.  There is no acute cardiopulmonary disease. Basilar atelectasis is present. Cholecystectomy clips are present in the right upper quadrant.  IMPRESSION: High-grade small bowel obstruction, which may be adhesive given the prior history of abdominal surgery or associated with hernia.   Electronically Signed   By: Dereck Ligas M.D.   On: 06/28/2013 19:38   Dg Abd Portable 1v  06/28/2013   CLINICAL DATA:  Assess NG tube placement  EXAM: PORTABLE ABDOMEN - 1 VIEW  COMPARISON:  DG ABD ACUTE W/CHEST dated 06/28/2013  FINDINGS: The tip and proximal port of the nasogastric tube lie below the expected location of the GE junction. There remain loops of mildly distended gas-filled small bowel. There are loops of normal calibered, gas-filled large bowel present. There are surgical clips in the gallbladder fossa. A surgical clip is noted in the right mid abdomen.  IMPRESSION: The patient has undergone placement of a nasogastric tube with adequate positioning radiographically.   Electronically Signed   By: Akon  [place]   On: 06/28/2013 21:08    Medications / Allergies: per  chart  Antibiotics: Anti-infectives   Start     Dose/Rate Route Frequency Ordered Stop   06/28/13 1845  doxycycline (VIBRA-TABS) tablet 100 mg     100 mg Oral  Once 06/28/13 1833 06/28/13 1850      Assessment/Plan SBO likely 2/2 adhesions Improving, await AXR, continue NGT to LWIS for now, IVF Mobilize  Erby Pian, [facility] Pager [phone] Office [phone]  06/29/2013  8:01 AM

## 2013-06-29 NOTE — Progress Notes (Signed)
I have seen and examined the pt and agree with NP-Riebock's progress note. KUB with non-osbtructive pattern.  CAn proceed with clamping trials and DC NGT if con't to do well

## 2013-06-30 LAB — GLUCOSE, CAPILLARY: Glucose-Capillary: 104 mg/dL — ABNORMAL HIGH (ref 70–99)

## 2013-06-30 NOTE — Progress Notes (Signed)
I have seen and examined the pt and agree with PA-Riebock's progress note. +BM Tol PO OK for home

## 2013-06-30 NOTE — Progress Notes (Signed)
Patient ID: Bruce [name], male   DOB: 1951/02/04, 63 y.o.   MRN: [id number]  Subjective: Had another BM yesterday.  Passing lots of flatus.  No n/v, abdominal pain.  Tolerated full liquids this AM.  Objective:  Vital signs:  Filed Vitals:   06/29/13 1338 06/29/13 2109 06/30/13 0500 06/30/13 0639  BP: 103/57 114/76  114/75  Pulse: 94 95  91  Temp: 97.9 F (36.6 C) 97.7 F (36.5 C)  98.2 F (36.8 C)  TempSrc: Oral Oral  Oral  Resp: 17 20  18   Height:      Weight:   257 lb 0.9 oz (116.6 kg)   SpO2: 96% 98%  96%    Last BM Date: 06/30/13  Intake/Output   Yesterday:  04/27 0701 - 04/28 0700 In: 2595 [P.O.:1440; I.V.:1041; NG/GT:40] Out: 425 [Urine:250; Emesis/NG output:175] This shift:    I/O last 3 completed shifts: In: 6387 [P.O.:1440; I.V.:1041; Other:900; NG/GT:40] Out: 800 [Urine:500; Emesis/NG output:300]    Physical Exam:  General: Pt awake/alert/oriented x4 in no acute distress  Abdomen: Soft. Nondistended. nontender. No evidence of peritonitis. No incarcerated hernias.   Problem List:   Principal Problem:   SBO (small bowel obstruction) Active Problems:   HTN (hypertension)   Hypothyroidism   Crohn disease   BPH (benign prostatic hypertrophy)   Unspecified constipation   Nausea and vomiting    Results:   Labs: Results for orders placed during the hospital encounter of 06/28/13 (from the past 48 hour(s))  CBC WITH DIFFERENTIAL     Status: Abnormal   Collection Time    06/28/13  6:38 PM      Result Value Ref Range   WBC 6.5  4.0 - 10.5 K/uL   RBC 4.69  4.22 - 5.81 MIL/uL   Hemoglobin 12.2 (*) 13.0 - 17.0 g/dL   HCT 38.0 (*) 39.0 - 52.0 %   MCV 81.0  78.0 - 100.0 fL   MCH 26.0  26.0 - 34.0 pg   MCHC 32.1  30.0 - 36.0 g/dL   RDW 15.5  11.5 - 15.5 %   Platelets 445 (*) 150 - 400 K/uL   Neutrophils Relative % 56  43 - 77 %   Neutro Abs 3.6  1.7 - 7.7 K/uL   Lymphocytes Relative 26  12 - 46 %   Lymphs Abs 1.7  0.7 - 4.0 K/uL   Monocytes  Relative 14 (*) 3 - 12 %   Monocytes Absolute 0.9  0.1 - 1.0 K/uL   Eosinophils Relative 4  0 - 5 %   Eosinophils Absolute 0.2  0.0 - 0.7 K/uL   Basophils Relative 0  0 - 1 %   Basophils Absolute 0.0  0.0 - 0.1 K/uL  COMPREHENSIVE METABOLIC PANEL     Status: Abnormal   Collection Time    06/28/13  6:38 PM      Result Value Ref Range   Sodium 133 (*) 137 - 147 mEq/L   Potassium 4.1  3.7 - 5.3 mEq/L   Chloride 101  96 - 112 mEq/L   CO2 21  19 - 32 mEq/L   Glucose, Bld 108 (*) 70 - 99 mg/dL   BUN 21  6 - 23 mg/dL   Creatinine, Ser 1.20  0.50 - 1.35 mg/dL   Calcium 8.6  8.4 - 10.5 mg/dL   Total Protein 7.0  6.0 - 8.3 g/dL   Albumin 3.1 (*) 3.5 - 5.2 g/dL   AST 21  0 -  37 U/L   ALT 18  0 - 53 U/L   Alkaline Phosphatase 102  39 - 117 U/L   Total Bilirubin 0.2 (*) 0.3 - 1.2 mg/dL   GFR calc non Af Amer 62 (*) >90 mL/min   GFR calc Af Amer 72 (*) >90 mL/min   Comment: (NOTE)     The eGFR has been calculated using the CKD EPI equation.     This calculation has not been validated in all clinical situations.     eGFR's persistently <90 mL/min signify possible Chronic Kidney     Disease.  LIPASE, BLOOD     Status: None   Collection Time    06/28/13  6:38 PM      Result Value Ref Range   Lipase 39  11 - 59 U/L  APTT     Status: None   Collection Time    06/28/13  6:38 PM      Result Value Ref Range   aPTT 28  24 - 37 seconds  PROTIME-INR     Status: None   Collection Time    06/28/13  6:38 PM      Result Value Ref Range   Prothrombin Time 13.7  11.6 - 15.2 seconds   INR 1.07  0.00 - 1.49  I-STAT CG4 LACTIC ACID, ED     Status: None   Collection Time    06/28/13  6:57 PM      Result Value Ref Range   Lactic Acid, Venous 1.87  0.5 - 2.2 mmol/L  URINALYSIS, ROUTINE W REFLEX MICROSCOPIC     Status: Abnormal   Collection Time    06/29/13  2:48 AM      Result Value Ref Range   Color, Urine YELLOW  YELLOW   APPearance CLEAR  CLEAR   Specific Gravity, Urine 1.029  1.005 - 1.030    pH 5.0  5.0 - 8.0   Glucose, UA NEGATIVE  NEGATIVE mg/dL   Hgb urine dipstick NEGATIVE  NEGATIVE   Bilirubin Urine SMALL (*) NEGATIVE   Ketones, ur 15 (*) NEGATIVE mg/dL   Protein, ur NEGATIVE  NEGATIVE mg/dL   Urobilinogen, UA 0.2  0.0 - 1.0 mg/dL   Nitrite NEGATIVE  NEGATIVE   Leukocytes, UA NEGATIVE  NEGATIVE   Comment: MICROSCOPIC NOT DONE ON URINES WITH NEGATIVE PROTEIN, BLOOD, LEUKOCYTES, NITRITE, OR GLUCOSE <1000 mg/dL.  COMPREHENSIVE METABOLIC PANEL     Status: Abnormal   Collection Time    06/29/13  5:45 AM      Result Value Ref Range   Sodium 137  137 - 147 mEq/L   Potassium 3.7  3.7 - 5.3 mEq/L   Chloride 104  96 - 112 mEq/L   CO2 20  19 - 32 mEq/L   Glucose, Bld 107 (*) 70 - 99 mg/dL   BUN 20  6 - 23 mg/dL   Creatinine, Ser 1.11  0.50 - 1.35 mg/dL   Calcium 8.1 (*) 8.4 - 10.5 mg/dL   Total Protein 6.4  6.0 - 8.3 g/dL   Albumin 2.9 (*) 3.5 - 5.2 g/dL   AST 16  0 - 37 U/L   ALT 15  0 - 53 U/L   Alkaline Phosphatase 88  39 - 117 U/L   Total Bilirubin 0.3  0.3 - 1.2 mg/dL   GFR calc non Af Amer 68 (*) >90 mL/min   GFR calc Af Amer 79 (*) >90 mL/min   Comment: (NOTE)     The  eGFR has been calculated using the CKD EPI equation.     This calculation has not been validated in all clinical situations.     eGFR's persistently <90 mL/min signify possible Chronic Kidney     Disease.  CBC     Status: Abnormal   Collection Time    06/29/13  5:45 AM      Result Value Ref Range   WBC 7.1  4.0 - 10.5 K/uL   RBC 4.31  4.22 - 5.81 MIL/uL   Hemoglobin 10.9 (*) 13.0 - 17.0 g/dL   HCT 35.5 (*) 39.0 - 52.0 %   MCV 82.4  78.0 - 100.0 fL   MCH 25.3 (*) 26.0 - 34.0 pg   MCHC 30.7  30.0 - 36.0 g/dL   RDW 15.6 (*) 11.5 - 15.5 %   Platelets 397  150 - 400 K/uL  MAGNESIUM     Status: None   Collection Time    06/29/13  5:45 AM      Result Value Ref Range   Magnesium 1.9  1.5 - 2.5 mg/dL  PHOSPHORUS     Status: None   Collection Time    06/29/13  5:45 AM      Result Value Ref  Range   Phosphorus 2.5  2.3 - 4.6 mg/dL  TSH     Status: None   Collection Time    06/29/13  5:45 AM      Result Value Ref Range   TSH 0.813  0.350 - 4.500 uIU/mL   Comment: Please note change in reference range.  GLUCOSE, CAPILLARY     Status: None   Collection Time    06/29/13  9:01 AM      Result Value Ref Range   Glucose-Capillary 95  70 - 99 mg/dL  GLUCOSE, CAPILLARY     Status: Abnormal   Collection Time    06/30/13  7:35 AM      Result Value Ref Range   Glucose-Capillary 104 (*) 70 - 99 mg/dL    Imaging / Studies: Dg Abd 2 Views  06/29/2013   CLINICAL DATA:  History of small bowel obstruction. Abdominal distention. Weakness.  EXAM: ABDOMEN - 2 VIEW  COMPARISON:  Abdominal radiograph 06/28/2013.  FINDINGS: A nasogastric tube is seen in the proximal stomach, with side port just distal to the gastroesophageal junction. Gas is noted throughout the colon to the level of the proximal rectum. No pathologic distention of small bowel. There are some prominent gas-filled loops of small bowel in the central abdomen and left upper quadrant measuring up to 2.9 cm in diameter. A few small air-fluid levels are noted on the upright projection. No pneumoperitoneum. Surgical clips project over the right upper quadrant of the abdomen, compatible with prior cholecystectomy.  IMPRESSION: 1. Nonspecific, nonobstructive bowel gas pattern, as above. 2. Postoperative changes and support apparatus, as above.   Electronically Signed   By: Vinnie Langton M.D.   On: 06/29/2013 08:31   Dg Abd Acute W/chest  06/28/2013   CLINICAL DATA:  Multiple hernias. Nausea and vomiting with diarrhea following lifting on Friday. History of prior abdominal surgery.  EXAM: ACUTE ABDOMEN SERIES (ABDOMEN 2 VIEW & CHEST 1 VIEW)  COMPARISON:  None.  FINDINGS: Small bowel obstruction is present with dilated loops of small bowel in the central and upper abdomen. Multiple air-fluid levels are present. No plain film evidence of free  air. Maximal diameter of small bowel loops in the central abdomen is 45 mm.  There  is no acute cardiopulmonary disease. Basilar atelectasis is present. Cholecystectomy clips are present in the right upper quadrant.  IMPRESSION: High-grade small bowel obstruction, which may be adhesive given the prior history of abdominal surgery or associated with hernia.   Electronically Signed   By: Dereck Ligas M.D.   On: 06/28/2013 19:38   Dg Abd Portable 1v  06/28/2013   CLINICAL DATA:  Assess NG tube placement  EXAM: PORTABLE ABDOMEN - 1 VIEW  COMPARISON:  DG ABD ACUTE W/CHEST dated 06/28/2013  FINDINGS: The tip and proximal port of the nasogastric tube lie below the expected location of the GE junction. There remain loops of mildly distended gas-filled small bowel. There are loops of normal calibered, gas-filled large bowel present. There are surgical clips in the gallbladder fossa. A surgical clip is noted in the right mid abdomen.  IMPRESSION: The patient has undergone placement of a nasogastric tube with adequate positioning radiographically.   Electronically Signed   By: Jemmie  Martinique   On: 06/28/2013 21:08    Medications / Allergies: per chart  Antibiotics: Anti-infectives   Start     Dose/Rate Route Frequency Ordered Stop   06/28/13 1845  doxycycline (VIBRA-TABS) tablet 100 mg     100 mg Oral  Once 06/28/13 1833 06/28/13 1850      Assessment/Plan  SBO likely 2/2 adhesions  Advance to a soft diet.  If he is able to tolerate, he may be discharge home today from surgical standpoint.  Erby Pian, [facility] Surgery Pager [phone] Office [phone]  06/30/2013 9:10 AM

## 2013-06-30 NOTE — Discharge Summary (Signed)
Physician Discharge Summary  Bruce [name]:[id number] DOB: 09-Aug-1950 DOA: 06/28/2013  PCP: [name], MD  Admit date: 06/28/2013 Discharge date: 06/30/2013  Time spent: 35 minutes  Recommendations for Outpatient Follow-up:  1. Periodic cbc, bmp  Discharge Diagnoses:  Principal Problem:   SBO (small bowel obstruction) Active Problems:   HTN (hypertension)   Hypothyroidism   Crohn disease   BPH (benign prostatic hypertrophy)   Unspecified constipation   Nausea and vomiting   Discharge Condition: improved  Diet recommendation: soft  Filed Weights   06/28/13 1739 06/28/13 2108 06/30/13 0500  Weight: 118.389 kg (261 lb) 114.034 kg (251 lb 6.4 oz) 116.6 kg (257 lb 0.9 oz)    History of present illness:  63 year old male with past medical history of Crohn's disease, hypertension, hypothyroidism who presented to [facility] ED 06/28/2013 with complaints of worsening mid abdominal pain, associated nausea, vomiting and diarrhea over past 2 days prior to this admission. Patient reports subjective fevers, chills and fatigue. No reports of blood ins tool. No reports of blood in emesis. He reported mid abdominal pain, 6/10 in intensity, non radiating. Pain not relieved with rest. Pain is intermittent and he reported pain relief with analgesia given in ED. No complaints of chest pain, shortness of breath, palpitations.  In ED, vitals are stable with BP of 110/78, HR 111, T max 98.2 F and oxygen saturation of 95% on room air. His blood work was significant for hemoglobin of 12.2, sodium of 133. Abdominal x ray showed high grade SBO. In addition patient had area of erythema/tick on left lower abdominal region which was removed and he was given 1 dose of doxycycline.   Hospital Course:  SBO Improving Tolerating food  -appreciate surgery   Nausea and vomiting, abdominal pain  - secondary to SBO  - management as above  HTN (hypertension)  - continue lisinopril  Hypothyroidism  - continue  synthroid   Crohn disease  - continue pentasa   BPH (benign prostatic hypertrophy)  - continue avodart   Tick bite- does not appear to be "target like lesion"- given doxy x 1 in ER on 4/26  Procedures:  NG tube  Consultations:  surgery  Discharge Exam: Filed Vitals:   06/30/13 0639  BP: 114/75  Pulse: 91  Temp: 98.2 F (36.8 C)  Resp: 18    General: A+Ox3, NAD Cardiovascular: rrr Respiratory: clear  Discharge Instructions You were cared for by a hospitalist during your hospital stay. If you have any questions about your discharge medications or the care you received while you were in the hospital after you are discharged, you can call the unit and asked to speak with the hospitalist on call if the hospitalist that took care of you is not available. Once you are discharged, your primary care physician will handle any further medical issues. Please note that NO REFILLS for any discharge medications will be authorized once you are discharged, as it is imperative that you return to your primary care physician (or establish a relationship with a primary care physician if you do not have one) for your aftercare needs so that they can reassess your need for medications and monitor your lab values.      Discharge Orders   Future Orders Complete By Expires   Discharge instructions  As directed    Increase activity slowly  As directed        Medication List         aspirin 81 MG tablet  Take 81 mg by mouth daily.     AVODART 0.5 MG capsule  Generic drug:  dutasteride  Take 0.5 mg by mouth daily.     levothyroxine 175 MCG tablet  Commonly known as:  SYNTHROID, LEVOTHROID  Take 175 mcg by mouth daily.     lisinopril 10 MG tablet  Commonly known as:  PRINIVIL,ZESTRIL  Take 10 mg by mouth daily.     mesalamine 500 MG CR capsule  Commonly known as:  PENTASA  Take 1,000 mg by mouth 4 (four) times daily.     polyethylene glycol packet  Commonly known as:  MIRALAX /  GLYCOLAX  Take 17 g by mouth daily.       Allergies  Allergen Reactions  . Coconut Flavor       The results of significant diagnostics from this hospitalization (including imaging, microbiology, ancillary and laboratory) are listed below for reference.    Significant Diagnostic Studies: Dg Abd 2 Views  06/29/2013   CLINICAL DATA:  History of small bowel obstruction. Abdominal distention. Weakness.  EXAM: ABDOMEN - 2 VIEW  COMPARISON:  Abdominal radiograph 06/28/2013.  FINDINGS: A nasogastric tube is seen in the proximal stomach, with side port just distal to the gastroesophageal junction. Gas is noted throughout the colon to the level of the proximal rectum. No pathologic distention of small bowel. There are some prominent gas-filled loops of small bowel in the central abdomen and left upper quadrant measuring up to 2.9 cm in diameter. A few small air-fluid levels are noted on the upright projection. No pneumoperitoneum. Surgical clips project over the right upper quadrant of the abdomen, compatible with prior cholecystectomy.  IMPRESSION: 1. Nonspecific, nonobstructive bowel gas pattern, as above. 2. Postoperative changes and support apparatus, as above.   Electronically Signed   By: Vinnie Langton M.D.   On: 06/29/2013 08:31   Dg Abd Acute W/chest  06/28/2013   CLINICAL DATA:  Multiple hernias. Nausea and vomiting with diarrhea following lifting on Friday. History of prior abdominal surgery.  EXAM: ACUTE ABDOMEN SERIES (ABDOMEN 2 VIEW & CHEST 1 VIEW)  COMPARISON:  None.  FINDINGS: Small bowel obstruction is present with dilated loops of small bowel in the central and upper abdomen. Multiple air-fluid levels are present. No plain film evidence of free air. Maximal diameter of small bowel loops in the central abdomen is 45 mm.  There is no acute cardiopulmonary disease. Basilar atelectasis is present. Cholecystectomy clips are present in the right upper quadrant.  IMPRESSION: High-grade small  bowel obstruction, which may be adhesive given the prior history of abdominal surgery or associated with hernia.   Electronically Signed   By: Dereck Ligas M.D.   On: 06/28/2013 19:38   Dg Abd Portable 1v  06/28/2013   CLINICAL DATA:  Assess NG tube placement  EXAM: PORTABLE ABDOMEN - 1 VIEW  COMPARISON:  DG ABD ACUTE W/CHEST dated 06/28/2013  FINDINGS: The tip and proximal port of the nasogastric tube lie below the expected location of the GE junction. There remain loops of mildly distended gas-filled small bowel. There are loops of normal calibered, gas-filled large bowel present. There are surgical clips in the gallbladder fossa. A surgical clip is noted in the right mid abdomen.  IMPRESSION: The patient has undergone placement of a nasogastric tube with adequate positioning radiographically.   Electronically Signed   By: Denzil  Martinique   On: 06/28/2013 21:08    Microbiology: No results found for this or any previous visit (from the  past 240 hour(s)).   Labs: Basic Metabolic Panel:  Recent Labs Lab 06/28/13 1838 06/29/13 0545  NA 133* 137  K 4.1 3.7  CL 101 104  CO2 21 20  GLUCOSE 108* 107*  BUN 21 20  CREATININE 1.20 1.11  CALCIUM 8.6 8.1*  MG  --  1.9  PHOS  --  2.5   Liver Function Tests:  Recent Labs Lab 06/28/13 1838 06/29/13 0545  AST 21 16  ALT 18 15  ALKPHOS 102 88  BILITOT 0.2* 0.3  PROT 7.0 6.4  ALBUMIN 3.1* 2.9*    Recent Labs Lab 06/28/13 1838  LIPASE 39   No results found for this basename: AMMONIA,  in the last 168 hours CBC:  Recent Labs Lab 06/28/13 1838 06/29/13 0545  WBC 6.5 7.1  NEUTROABS 3.6  --   HGB 12.2* 10.9*  HCT 38.0* 35.5*  MCV 81.0 82.4  PLT 445* 397   Cardiac Enzymes: No results found for this basename: CKTOTAL, CKMB, CKMBINDEX, TROPONINI,  in the last 168 hours BNP: BNP (last 3 results) No results found for this basename: PROBNP,  in the last 8760 hours CBG:  Recent Labs Lab 06/29/13 0901 06/30/13 0735  GLUCAP 95  104*       Signed:  Geradine Girt  [facility] 06/30/2013, 9:43 AM

## 2013-08-03 ENCOUNTER — Emergency Department (HOSPITAL_COMMUNITY): Payer: 59

## 2013-08-03 ENCOUNTER — Encounter (HOSPITAL_COMMUNITY): Payer: Self-pay | Admitting: Emergency Medicine

## 2013-08-03 ENCOUNTER — Inpatient Hospital Stay (HOSPITAL_COMMUNITY)
Admission: EM | Admit: 2013-08-03 | Discharge: 2013-08-18 | DRG: 329 | Disposition: A | Discharge status: Home / Self Care | Payer: 59 | Attending: Internal Medicine | Admitting: Internal Medicine

## 2013-08-03 DIAGNOSIS — E860 Dehydration: Secondary | ICD-10-CM

## 2013-08-03 DIAGNOSIS — R7309 Other abnormal glucose: Secondary | ICD-10-CM | POA: Diagnosis present

## 2013-08-03 DIAGNOSIS — E871 Hypo-osmolality and hyponatremia: Secondary | ICD-10-CM | POA: Diagnosis present

## 2013-08-03 DIAGNOSIS — J9600 Acute respiratory failure, unspecified whether with hypoxia or hypercapnia: Secondary | ICD-10-CM | POA: Diagnosis present

## 2013-08-03 DIAGNOSIS — Z6833 Body mass index (BMI) 33.0-33.9, adult: Secondary | ICD-10-CM

## 2013-08-03 DIAGNOSIS — K5669 Other intestinal obstruction: Secondary | ICD-10-CM | POA: Diagnosis present

## 2013-08-03 DIAGNOSIS — N183 Chronic kidney disease, stage 3 unspecified: Secondary | ICD-10-CM

## 2013-08-03 DIAGNOSIS — E669 Obesity, unspecified: Secondary | ICD-10-CM

## 2013-08-03 DIAGNOSIS — G4730 Sleep apnea, unspecified: Secondary | ICD-10-CM

## 2013-08-03 DIAGNOSIS — Z7982 Long term (current) use of aspirin: Secondary | ICD-10-CM

## 2013-08-03 DIAGNOSIS — D638 Anemia in other chronic diseases classified elsewhere: Secondary | ICD-10-CM | POA: Diagnosis present

## 2013-08-03 DIAGNOSIS — J9601 Acute respiratory failure with hypoxia: Secondary | ICD-10-CM

## 2013-08-03 DIAGNOSIS — R112 Nausea with vomiting, unspecified: Secondary | ICD-10-CM

## 2013-08-03 DIAGNOSIS — E739 Lactose intolerance, unspecified: Secondary | ICD-10-CM | POA: Diagnosis present

## 2013-08-03 DIAGNOSIS — E872 Acidosis, unspecified: Secondary | ICD-10-CM

## 2013-08-03 DIAGNOSIS — Z79899 Other long term (current) drug therapy: Secondary | ICD-10-CM

## 2013-08-03 DIAGNOSIS — K56609 Unspecified intestinal obstruction, unspecified as to partial versus complete obstruction: Secondary | ICD-10-CM

## 2013-08-03 DIAGNOSIS — Z87891 Personal history of nicotine dependence: Secondary | ICD-10-CM

## 2013-08-03 DIAGNOSIS — K5000 Crohn's disease of small intestine without complications: Principal | ICD-10-CM

## 2013-08-03 DIAGNOSIS — E119 Type 2 diabetes mellitus without complications: Secondary | ICD-10-CM

## 2013-08-03 DIAGNOSIS — I129 Hypertensive chronic kidney disease with stage 1 through stage 4 chronic kidney disease, or unspecified chronic kidney disease: Secondary | ICD-10-CM | POA: Diagnosis present

## 2013-08-03 DIAGNOSIS — E46 Unspecified protein-calorie malnutrition: Secondary | ICD-10-CM | POA: Diagnosis present

## 2013-08-03 DIAGNOSIS — D72829 Elevated white blood cell count, unspecified: Secondary | ICD-10-CM | POA: Diagnosis present

## 2013-08-03 DIAGNOSIS — E039 Hypothyroidism, unspecified: Secondary | ICD-10-CM

## 2013-08-03 DIAGNOSIS — I10 Essential (primary) hypertension: Secondary | ICD-10-CM

## 2013-08-03 DIAGNOSIS — N400 Enlarged prostate without lower urinary tract symptoms: Secondary | ICD-10-CM

## 2013-08-03 DIAGNOSIS — R000 Tachycardia, unspecified: Secondary | ICD-10-CM | POA: Diagnosis present

## 2013-08-03 DIAGNOSIS — E118 Type 2 diabetes mellitus with unspecified complications: Secondary | ICD-10-CM

## 2013-08-03 LAB — COMPREHENSIVE METABOLIC PANEL
ALT: 22 U/L (ref 0–53)
AST: 24 U/L (ref 0–37)
Albumin: 3.8 g/dL (ref 3.5–5.2)
Alkaline Phosphatase: 112 U/L (ref 39–117)
BUN: 14 mg/dL (ref 6–23)
CO2: 23 mEq/L (ref 19–32)
Calcium: 9.4 mg/dL (ref 8.4–10.5)
Chloride: 103 mEq/L (ref 96–112)
Creatinine, Ser: 1.34 mg/dL (ref 0.50–1.35)
GFR calc Af Amer: 63 mL/min — ABNORMAL LOW (ref >90)
GFR calc non Af Amer: 54 mL/min — ABNORMAL LOW (ref >90)
Glucose, Bld: 161 mg/dL — ABNORMAL HIGH (ref 70–99)
Potassium: 4.3 mEq/L (ref 3.7–5.3)
Sodium: 139 mEq/L (ref 137–147)
Total Bilirubin: 0.4 mg/dL (ref 0.3–1.2)
Total Protein: 7.7 g/dL (ref 6.0–8.3)

## 2013-08-03 LAB — CBC WITH DIFFERENTIAL/PLATELET
Basophils Absolute: 0 10*3/uL (ref 0.0–0.1)
Basophils Relative: 0 % (ref 0–1)
Eosinophils Absolute: 0.1 10*3/uL (ref 0.0–0.7)
Eosinophils Relative: 1 % (ref 0–5)
HCT: 38.7 % — ABNORMAL LOW (ref 39.0–52.0)
Hemoglobin: 12.4 g/dL — ABNORMAL LOW (ref 13.0–17.0)
Lymphocytes Relative: 11 % — ABNORMAL LOW (ref 12–46)
Lymphs Abs: 1.6 10*3/uL (ref 0.7–4.0)
MCH: 25.5 pg — ABNORMAL LOW (ref 26.0–34.0)
MCHC: 32 g/dL (ref 30.0–36.0)
MCV: 79.6 fL (ref 78.0–100.0)
Monocytes Absolute: 1 10*3/uL (ref 0.1–1.0)
Monocytes Relative: 7 % (ref 3–12)
Neutro Abs: 11.8 10*3/uL — ABNORMAL HIGH (ref 1.7–7.7)
Neutrophils Relative %: 81 % — ABNORMAL HIGH (ref 43–77)
Platelets: 450 10*3/uL — ABNORMAL HIGH (ref 150–400)
RBC: 4.86 MIL/uL (ref 4.22–5.81)
RDW: 16.1 % — ABNORMAL HIGH (ref 11.5–15.5)
WBC: 14.5 10*3/uL — ABNORMAL HIGH (ref 4.0–10.5)

## 2013-08-03 LAB — LIPASE, BLOOD: Lipase: 47 U/L (ref 11–59)

## 2013-08-03 LAB — LACTIC ACID, PLASMA: Lactic Acid, Venous: 2.3 mmol/L — ABNORMAL HIGH (ref 0.5–2.2)

## 2013-08-03 MED ORDER — PROMETHAZINE HCL 25 MG/ML IJ SOLN
12.5000 mg | Freq: Once | INTRAMUSCULAR | Status: AC
  Administered 2013-08-03: 12.5 mg via INTRAVENOUS
  Filled 2013-08-03 (×2): qty 1

## 2013-08-03 MED ORDER — HYDROMORPHONE HCL PF 1 MG/ML IJ SOLN
1.0000 mg | Freq: Once | INTRAMUSCULAR | Status: AC
  Administered 2013-08-03: 1 mg via INTRAVENOUS
  Filled 2013-08-03: qty 1

## 2013-08-03 MED ORDER — SODIUM CHLORIDE 0.9 % IV BOLUS (SEPSIS)
1000.0000 mL | Freq: Once | INTRAVENOUS | Status: AC
  Administered 2013-08-03: 1000 mL via INTRAVENOUS

## 2013-08-03 NOTE — ED Notes (Addendum)
Per EMS, pt was driving himself in when he became diaphoretic and the pain increased in his abd. Pt has hx of bowel obstruction a few weeks ago. EMS reports that the pt had a period of unresponsiveness, pt was given 55m of NS and put in reverse trendelenburg position and became arroussable. Pt was also given 867mof Zofra for nausea. Pt alert x 4 at this time. Pt is diaphoretic and pale. Pt arousable to voice.

## 2013-08-04 ENCOUNTER — Encounter (HOSPITAL_COMMUNITY): Payer: Self-pay | Admitting: Radiology

## 2013-08-04 ENCOUNTER — Inpatient Hospital Stay (HOSPITAL_COMMUNITY): Payer: 59

## 2013-08-04 DIAGNOSIS — E872 Acidosis, unspecified: Secondary | ICD-10-CM

## 2013-08-04 DIAGNOSIS — N400 Enlarged prostate without lower urinary tract symptoms: Secondary | ICD-10-CM

## 2013-08-04 DIAGNOSIS — J9600 Acute respiratory failure, unspecified whether with hypoxia or hypercapnia: Secondary | ICD-10-CM

## 2013-08-04 DIAGNOSIS — N183 Chronic kidney disease, stage 3 unspecified: Secondary | ICD-10-CM

## 2013-08-04 DIAGNOSIS — K56609 Unspecified intestinal obstruction, unspecified as to partial versus complete obstruction: Secondary | ICD-10-CM

## 2013-08-04 DIAGNOSIS — J9601 Acute respiratory failure with hypoxia: Secondary | ICD-10-CM | POA: Diagnosis present

## 2013-08-04 DIAGNOSIS — R112 Nausea with vomiting, unspecified: Secondary | ICD-10-CM

## 2013-08-04 DIAGNOSIS — E860 Dehydration: Secondary | ICD-10-CM

## 2013-08-04 DIAGNOSIS — G4730 Sleep apnea, unspecified: Secondary | ICD-10-CM

## 2013-08-04 LAB — CREATININE, SERUM
Creatinine, Ser: 1.32 mg/dL (ref 0.50–1.35)
GFR calc Af Amer: 64 mL/min — ABNORMAL LOW (ref >90)
GFR calc non Af Amer: 55 mL/min — ABNORMAL LOW (ref >90)

## 2013-08-04 LAB — URINALYSIS W MICROSCOPIC (NOT AT ARMC)
Bilirubin Urine: NEGATIVE
Glucose, UA: NEGATIVE mg/dL
Hgb urine dipstick: NEGATIVE
Ketones, ur: NEGATIVE mg/dL
Leukocytes, UA: NEGATIVE
Nitrite: NEGATIVE
Protein, ur: NEGATIVE mg/dL
Specific Gravity, Urine: 1.035 — ABNORMAL HIGH (ref 1.005–1.030)
Urobilinogen, UA: 0.2 mg/dL (ref 0.0–1.0)
pH: 5 (ref 5.0–8.0)

## 2013-08-04 LAB — CBC WITH DIFFERENTIAL/PLATELET
Basophils Absolute: 0 10*3/uL (ref 0.0–0.1)
Basophils Relative: 0 % (ref 0–1)
Eosinophils Absolute: 0 10*3/uL (ref 0.0–0.7)
Eosinophils Relative: 0 % (ref 0–5)
HCT: 38.4 % — ABNORMAL LOW (ref 39.0–52.0)
Hemoglobin: 12.1 g/dL — ABNORMAL LOW (ref 13.0–17.0)
Lymphocytes Relative: 4 % — ABNORMAL LOW (ref 12–46)
Lymphs Abs: 0.6 10*3/uL — ABNORMAL LOW (ref 0.7–4.0)
MCH: 25.2 pg — ABNORMAL LOW (ref 26.0–34.0)
MCHC: 31.5 g/dL (ref 30.0–36.0)
MCV: 79.8 fL (ref 78.0–100.0)
Monocytes Absolute: 1.2 10*3/uL — ABNORMAL HIGH (ref 0.1–1.0)
Monocytes Relative: 7 % (ref 3–12)
Neutro Abs: 14.6 10*3/uL — ABNORMAL HIGH (ref 1.7–7.7)
Neutrophils Relative %: 89 % — ABNORMAL HIGH (ref 43–77)
Platelets: 444 10*3/uL — ABNORMAL HIGH (ref 150–400)
RBC: 4.81 MIL/uL (ref 4.22–5.81)
RDW: 16.2 % — ABNORMAL HIGH (ref 11.5–15.5)
WBC: 16.5 10*3/uL — ABNORMAL HIGH (ref 4.0–10.5)

## 2013-08-04 LAB — CBC
HCT: 41.3 % (ref 39.0–52.0)
Hemoglobin: 13.2 g/dL (ref 13.0–17.0)
MCH: 25.6 pg — ABNORMAL LOW (ref 26.0–34.0)
MCHC: 32 g/dL (ref 30.0–36.0)
MCV: 80 fL (ref 78.0–100.0)
Platelets: 470 10*3/uL — ABNORMAL HIGH (ref 150–400)
RBC: 5.16 MIL/uL (ref 4.22–5.81)
RDW: 16.1 % — ABNORMAL HIGH (ref 11.5–15.5)
WBC: 17.2 10*3/uL — ABNORMAL HIGH (ref 4.0–10.5)

## 2013-08-04 LAB — COMPREHENSIVE METABOLIC PANEL
ALT: 25 U/L (ref 0–53)
AST: 26 U/L (ref 0–37)
Albumin: 3.3 g/dL — ABNORMAL LOW (ref 3.5–5.2)
Alkaline Phosphatase: 100 U/L (ref 39–117)
BUN: 19 mg/dL (ref 6–23)
CO2: 22 mEq/L (ref 19–32)
Calcium: 8.5 mg/dL (ref 8.4–10.5)
Chloride: 102 mEq/L (ref 96–112)
Creatinine, Ser: 1.25 mg/dL (ref 0.50–1.35)
GFR calc Af Amer: 69 mL/min — ABNORMAL LOW (ref >90)
GFR calc non Af Amer: 59 mL/min — ABNORMAL LOW (ref >90)
Glucose, Bld: 167 mg/dL — ABNORMAL HIGH (ref 70–99)
Potassium: 5.1 mEq/L (ref 3.7–5.3)
Sodium: 136 mEq/L — ABNORMAL LOW (ref 137–147)
Total Bilirubin: 0.3 mg/dL (ref 0.3–1.2)
Total Protein: 7.1 g/dL (ref 6.0–8.3)

## 2013-08-04 LAB — TROPONIN I
Troponin I: <0.3 ng/mL (ref <0.30)
Troponin I: <0.3 ng/mL (ref <0.30)
Troponin I: <0.3 ng/mL (ref <0.30)

## 2013-08-04 LAB — LACTIC ACID, PLASMA: Lactic Acid, Venous: 2.3 mmol/L — ABNORMAL HIGH (ref 0.5–2.2)

## 2013-08-04 LAB — MRSA PCR SCREENING: MRSA by PCR: NEGATIVE

## 2013-08-04 LAB — TSH: TSH: 2.2 u[IU]/mL (ref 0.350–4.500)

## 2013-08-04 LAB — GLUCOSE, CAPILLARY
Glucose-Capillary: 189 mg/dL — ABNORMAL HIGH (ref 70–99)
Glucose-Capillary: 154 mg/dL — ABNORMAL HIGH (ref 70–99)

## 2013-08-04 MED ORDER — LEVOTHYROXINE SODIUM 100 MCG IV SOLR
87.5000 ug | Freq: Every day | INTRAVENOUS | Status: DC
  Administered 2013-08-04 – 2013-08-15 (×11): 87.5 ug via INTRAVENOUS
  Administered 2013-08-16: 10:00:00 via INTRAVENOUS
  Filled 2013-08-04 (×13): qty 5

## 2013-08-04 MED ORDER — IOHEXOL 300 MG/ML  SOLN
100.0000 mL | Freq: Once | INTRAMUSCULAR | Status: AC | PRN
  Administered 2013-08-04: 100 mL via INTRAVENOUS

## 2013-08-04 MED ORDER — CIPROFLOXACIN IN D5W 400 MG/200ML IV SOLN
400.0000 mg | Freq: Two times a day (BID) | INTRAVENOUS | Status: DC
  Administered 2013-08-04 – 2013-08-14 (×20): 400 mg via INTRAVENOUS
  Filled 2013-08-04 (×24): qty 200

## 2013-08-04 MED ORDER — IOHEXOL 300 MG/ML  SOLN
20.0000 mL | INTRAMUSCULAR | Status: AC
  Administered 2013-08-04 (×2): 20 mL via ORAL

## 2013-08-04 MED ORDER — HYDROMORPHONE HCL PF 1 MG/ML IJ SOLN
1.0000 mg | INTRAMUSCULAR | Status: DC | PRN
  Administered 2013-08-04: 1 mg via INTRAVENOUS
  Filled 2013-08-04: qty 2

## 2013-08-04 MED ORDER — METRONIDAZOLE IN NACL 5-0.79 MG/ML-% IV SOLN
500.0000 mg | Freq: Four times a day (QID) | INTRAVENOUS | Status: DC
  Administered 2013-08-04 – 2013-08-14 (×40): 500 mg via INTRAVENOUS
  Filled 2013-08-04 (×45): qty 100

## 2013-08-04 MED ORDER — HYDRALAZINE HCL 20 MG/ML IJ SOLN: 2.0000 mg | INTRAMUSCULAR | Status: DC | PRN

## 2013-08-04 MED ORDER — METHYLPREDNISOLONE SODIUM SUCC 125 MG IJ SOLR
60.0000 mg | Freq: Two times a day (BID) | INTRAMUSCULAR | Status: DC
  Administered 2013-08-04 – 2013-08-06 (×4): 60 mg via INTRAVENOUS
  Administered 2013-08-06: 62.5 mg via INTRAVENOUS
  Administered 2013-08-07 – 2013-08-12 (×10): 60 mg via INTRAVENOUS
  Filled 2013-08-04 (×6): qty 0.96
  Filled 2013-08-04: qty 2
  Filled 2013-08-04 (×8): qty 0.96
  Filled 2013-08-04: qty 2
  Filled 2013-08-04 (×3): qty 0.96
  Filled 2013-08-04: qty 2
  Filled 2013-08-04: qty 0.96

## 2013-08-04 MED ORDER — POTASSIUM CHLORIDE IN NACL 20-0.9 MEQ/L-% IV SOLN
INTRAVENOUS | Status: AC
  Administered 2013-08-04 – 2013-08-05 (×2): via INTRAVENOUS
  Filled 2013-08-04 (×5): qty 1000

## 2013-08-04 MED ORDER — KCL IN DEXTROSE-NACL 20-5-0.45 MEQ/L-%-% IV SOLN: INTRAVENOUS | Status: DC

## 2013-08-04 MED ORDER — HEPARIN SODIUM (PORCINE) 5000 UNIT/ML IJ SOLN
5000.0000 [IU] | Freq: Three times a day (TID) | INTRAMUSCULAR | Status: DC
  Administered 2013-08-04 – 2013-08-18 (×41): 5000 [IU] via SUBCUTANEOUS
  Filled 2013-08-04 (×54): qty 1

## 2013-08-04 MED ORDER — SODIUM CHLORIDE 0.9 % IJ SOLN
3.0000 mL | Freq: Two times a day (BID) | INTRAMUSCULAR | Status: DC
  Administered 2013-08-04 – 2013-08-13 (×5): 3 mL via INTRAVENOUS

## 2013-08-04 NOTE — ED Provider Notes (Signed)
CSN: [id number]     Arrival date & time 08/03/13  2240 History   First MD Initiated Contact with Patient 08/03/13 2249     Chief Complaint  Patient presents with  . Abdominal Pain     (Consider location/radiation/quality/duration/timing/severity/associated sxs/prior Treatment) HPI Comments: Patient is a 63 year old male with history of Crohn's disease, obesity, and multiple abdominal surgeries with adhesions and recurrent small bowel obstructions. He presents today with complaints of severe abdominal cramping, abdominal distention, and vomiting that started several hours prior to arrival. States he has not had a bowel movement today and denies passing flatus. He denies any fevers or chills. Denies any urinary complaints.  Patient is a 63 y.o. male presenting with abdominal pain. The history is provided by the patient.  Abdominal Pain Pain location:  Generalized Pain quality: cramping   Pain radiates to:  Does not radiate Pain severity:  Severe Onset quality:  Sudden Duration:  3 hours Timing:  Constant Progression:  Worsening Chronicity:  Recurrent Relieved by:  Nothing Worsened by:  Nothing tried Ineffective treatments:  None tried   Past Medical History  Diagnosis Date  . Crohn's disease   . Hypertension   . Sleep apnea   . Prostate enlargement    Past Surgical History  Procedure Laterality Date  . Nasal sinus surgery    . Cholecystectomy    . Colonoscopy    . 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 Seventh nerve face    . Colonoscopy  01/19/2011    Procedure: COLONOSCOPY;  Surgeon: Rogene Houston, MD;  Location: [facility];  Service: Endoscopy;  Laterality: N/A;  9:30 am  . Colon surgery     Family History  Problem Relation Age of Onset  . Colon cancer Mother   . Colon cancer Sister   . Healthy Sister   . Healthy Sister   . Crohn's disease Daughter   . Healthy Son   . Arthritis Other   . Heart disease Other   . Cancer Other     Colon,  Uterine and Prostate Cancer   History  Substance Use Topics  . Smoking status: Former Smoker -- 4.00 packs/day for 30 years    Types: Cigarettes    Quit date: 12/24/2005  . Smokeless tobacco: Never Used  . Alcohol Use: No    Review of Systems  Gastrointestinal: Positive for abdominal pain.  All other systems reviewed and are negative.     Allergies  Coconut flavor  Home Medications   Prior to Admission medications   Medication Sig Start Date End Date Taking? Authorizing Provider  aspirin 81 MG tablet Take 81 mg by mouth daily.      Historical Provider, MD  dutasteride (AVODART) 0.5 MG capsule Take 0.5 mg by mouth daily.      Historical Provider, MD  levothyroxine (SYNTHROID, LEVOTHROID) 175 MCG tablet Take 175 mcg by mouth daily.      Historical Provider, MD  lisinopril (PRINIVIL,ZESTRIL) 10 MG tablet Take 10 mg by mouth daily.    Historical Provider, MD  mesalamine (PENTASA) 500 MG CR capsule Take 1,000 mg by mouth 4 (four) times daily.    Historical Provider, MD  polyethylene glycol (MIRALAX / GLYCOLAX) packet Take 17 g by mouth daily.      Historical Provider, MD   BP 123/75  Pulse 111  Temp(Src) 97.5 F (36.4 C) (Oral)  Resp 12  SpO2 95% Physical Exam  Nursing note and vitals  reviewed. Constitutional: He is oriented to person, place, and time. He appears well-developed and well-nourished. He appears distressed.  HENT:  Head: Normocephalic and atraumatic.  Mouth/Throat: Oropharynx is clear and moist.  Neck: Normal range of motion. Neck supple.  Cardiovascular: Normal rate, regular rhythm and normal heart sounds.   No murmur heard. Pulmonary/Chest: Effort normal and breath sounds normal. No respiratory distress. He has no wheezes.  Abdominal: Soft. He exhibits distension. There is tenderness.  There is tenderness to palpation in all 4 quadrants. There is no rebound or guarding.  Musculoskeletal: Normal range of motion. He exhibits no edema.  Neurological: He is  alert and oriented to person, place, and time.  Skin: Skin is warm. He is diaphoretic.    ED Course  Procedures (including critical care time) Labs Review Labs Reviewed  CBC WITH DIFFERENTIAL - Abnormal; Notable for the following:    WBC 14.5 (*)    Hemoglobin 12.4 (*)    HCT 38.7 (*)    MCH 25.5 (*)    RDW 16.1 (*)    Platelets 450 (*)    Neutrophils Relative % 81 (*)    Neutro Abs 11.8 (*)    Lymphocytes Relative 11 (*)    All other components within normal limits  COMPREHENSIVE METABOLIC PANEL - Abnormal; Notable for the following:    Glucose, Bld 161 (*)    GFR calc non Af Amer 54 (*)    GFR calc Af Amer 63 (*)    All other components within normal limits  LACTIC ACID, PLASMA - Abnormal; Notable for the following:    Lactic Acid, Venous 2.3 (*)    All other components within normal limits  LIPASE, BLOOD    Imaging Review No results found.   Date: 08/04/2013  Rate: 110  Rhythm: sinus tachycardia  QRS Axis: normal  Intervals: normal  ST/T Wave abnormalities: normal  Conduction Disutrbances:none  Narrative Interpretation:   Old EKG Reviewed: unchanged    MDM   Final diagnoses:  None    Patient presents with abdominal pain, distention, and vomiting. Obstruction series revealed a recurrent high grade small bowel obstruction an elevated white count. I've discussed with Dr. Barry Dienes from general surgery who recommends an NG tube and admission to medicine. I've consulted Dr. Ernestina Patches from [facility] who agrees to admit.    Veryl Speak, MD 08/04/13 [phone]

## 2013-08-04 NOTE — Consult Note (Signed)
Reason for Consult:p SBO Referring Physician: Veryl Speak  Bruce [name] is an 63 y.o. male.  HPI:  Patient is a 63 year old male referred by the emergency room physician Dr. Stark Jock for consultation regarding a small bowel obstruction.  The patient was just in the hospital a month ago for partial obstruction. He came to the emergency department today for several hours of acutely worsening abdominal pain and distention. He stated that he felt like he needed to pass gas but couldn't get that feeling relieved. He has had 2 small episodes of flatus today and one bowel movement yesterday. He has a history of Crohn's disease. Dr. [name] in [place] is his regular gastroenterologist.  His last colonoscopy was in 2012. 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  He takes Pentasa for his current disease. He has not had any recent bloody diarrhea. His current diagnosis was around 10 years ago with a perforation following surgery for Meckel's.  Past Medical History  Diagnosis Date  . Crohn's disease   . Hypertension   . Sleep apnea   . Prostate enlargement     Past Surgical History  Procedure Laterality Date  . Nasal sinus surgery    . Cholecystectomy    . Colonoscopy    . 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 Seventh nerve face    . Colonoscopy  01/19/2011    Procedure: COLONOSCOPY;  Surgeon: Rogene Houston, MD;  Location: [facility];  Service: Endoscopy;  Laterality: N/A;  9:30 am  . Colon surgery      Family History  Problem Relation Age of Onset  . Colon cancer Mother   . Colon cancer Sister   . Healthy Sister   . Healthy Sister   . Crohn's disease Daughter   . Healthy Son   . Arthritis Other   . Heart disease Other   . Cancer Other     Colon, Uterine and Prostate Cancer    Social History:  reports that he quit smoking about 7 years ago. His smoking use included Cigarettes. He has a 120 pack-year smoking history. He has never used  smokeless tobacco. He reports that he does not drink alcohol or use illicit drugs.  Allergies:  Allergies  Allergen Reactions  . Coconut Flavor Other (See Comments)    Does not eat because of crohn's     Medications: MedicationsLong-Term  Outpatient Medications Hospital Medications    aspirin 81 MG tablet   dutasteride (AVODART) 0.5 MG capsule   levothyroxine (SYNTHROID, LEVOTHROID) 175 MCG tablet   lisinopril (PRINIVIL,ZESTRIL) 10 MG tablet   mesalamine (PENTASA) 500 MG CR capsule   polyethylene glycol (MIRALAX / GLYCOLAX) packet      Results for orders placed during the hospital encounter of 08/03/13 (from the past 48 hour(s))  CBC WITH DIFFERENTIAL     Status: Abnormal   Collection Time    08/03/13 10:58 PM      Result Value Ref Range   WBC 14.5 (*) 4.0 - 10.5 K/uL   RBC 4.86  4.22 - 5.81 MIL/uL   Hemoglobin 12.4 (*) 13.0 - 17.0 g/dL   HCT 38.7 (*) 39.0 - 52.0 %   MCV 79.6  78.0 - 100.0 fL   MCH 25.5 (*) 26.0 - 34.0 pg   MCHC 32.0  30.0 - 36.0 g/dL   RDW 16.1 (*) 11.5 - 15.5 %   Platelets 450 (*) 150 - 400 K/uL  Neutrophils Relative % 81 (*) 43 - 77 %   Neutro Abs 11.8 (*) 1.7 - 7.7 K/uL   Lymphocytes Relative 11 (*) 12 - 46 %   Lymphs Abs 1.6  0.7 - 4.0 K/uL   Monocytes Relative 7  3 - 12 %   Monocytes Absolute 1.0  0.1 - 1.0 K/uL   Eosinophils Relative 1  0 - 5 %   Eosinophils Absolute 0.1  0.0 - 0.7 K/uL   Basophils Relative 0  0 - 1 %   Basophils Absolute 0.0  0.0 - 0.1 K/uL  COMPREHENSIVE METABOLIC PANEL     Status: Abnormal   Collection Time    08/03/13 10:58 PM      Result Value Ref Range   Sodium 139  137 - 147 mEq/L   Potassium 4.3  3.7 - 5.3 mEq/L   Chloride 103  96 - 112 mEq/L   CO2 23  19 - 32 mEq/L   Glucose, Bld 161 (*) 70 - 99 mg/dL   BUN 14  6 - 23 mg/dL   Creatinine, Ser 1.34  0.50 - 1.35 mg/dL   Calcium 9.4  8.4 - 10.5 mg/dL   Total Protein 7.7  6.0 - 8.3 g/dL   Albumin 3.8  3.5 - 5.2 g/dL   AST 24  0 - 37 U/L   ALT 22  0 - 53 U/L    Alkaline Phosphatase 112  39 - 117 U/L   Total Bilirubin 0.4  0.3 - 1.2 mg/dL   GFR calc non Af Amer 54 (*) >90 mL/min   GFR calc Af Amer 63 (*) >90 mL/min   Comment: (NOTE)     The eGFR has been calculated using the CKD EPI equation.     This calculation has not been validated in all clinical situations.     eGFR's persistently <90 mL/min signify possible Chronic Kidney     Disease.  LIPASE, BLOOD     Status: None   Collection Time    08/03/13 10:58 PM      Result Value Ref Range   Lipase 47  11 - 59 U/L  LACTIC ACID, PLASMA     Status: Abnormal   Collection Time    08/03/13 10:58 PM      Result Value Ref Range   Lactic Acid, Venous 2.3 (*) 0.5 - 2.2 mmol/L    Dg Abd Acute W/chest  08/04/2013   CLINICAL DATA:  Abdominal pain and vomiting, history of small bowel obstruction.  EXAM: ACUTE ABDOMEN SERIES (ABDOMEN 2 VIEW & CHEST 1 VIEW)  COMPARISON:  Abdominal radiograph June 29, 2013.  FINDINGS: Cardiomediastinal silhouette is unremarkable. Strandy densities in left lung base. No pleural effusions. No pneumothorax. Soft tissue planes and included osseous structures are nonsuspicious.  Gas distended small bowel to 4.4 cm with paucity of large bowel gas. Small bowel air-fluid levels at relatively uniform level on upright view. No intraperitoneal free air.  No intra-abdominal mass effect or pathologic calcifications. Surgical clips in the right abdomen may reflect cholecystectomy. Phleboliths in the pelvis. Soft tissue planes and included osseous structures are nonsuspicious.  IMPRESSION: No acute cardiopulmonary process.  Recurrent high-grade small bowel obstruction.   Electronically Signed   By: Elon Alas   On: 08/04/2013 00:14    Review of Systems  Constitutional: Negative.   HENT: Positive for hearing loss.   Eyes: Negative.   Respiratory: Negative.   Cardiovascular: Negative.   Gastrointestinal: Positive for nausea, vomiting and abdominal pain.  Negative for blood in stool.    Musculoskeletal: Negative.   Skin: Negative.   Neurological: Negative.   Endo/Heme/Allergies: Negative.   Psychiatric/Behavioral: Negative.    Blood pressure 122/89, pulse 127, temperature 97.5 F (36.4 C), temperature source Oral, resp. rate 18, SpO2 93.00%. Physical Exam  Constitutional: He appears well-developed and well-nourished. No distress.  HENT:  Head: Normocephalic and atraumatic.  Eyes: Conjunctivae are normal. Pupils are equal, round, and reactive to light. Right eye exhibits no discharge. Left eye exhibits no discharge.  Neck: Neck supple. No thyromegaly present.  Cardiovascular: Regular rhythm, normal heart sounds and intact distal pulses.  Tachycardia present.  Exam reveals no gallop and no friction rub.   No murmur heard. Respiratory: Effort normal and breath sounds normal. No respiratory distress. He has no wheezes. He has no rales. He exhibits no tenderness.  GI: Soft. He exhibits distension. He exhibits no mass. Tenderness: mild diffuse tenderness. There is no rebound and no guarding.  Musculoskeletal: He exhibits no edema and no tenderness.  Neurological:  Sleepy, but oriented   Skin: Skin is warm and dry. No rash noted. He is not diaphoretic. No erythema. No pallor.  Psychiatric: He has a normal mood and affect. His behavior is normal. Judgment and thought content normal.    Assessment/Plan: Partial small bowel obstruction  Hydration, bowel rest, NG tube decompression.  Patient may need surgical resection if this structure is the cause of his recurrent bowel obstruction. He has not had a CT since February and may benefit from on this admission.  Stark Klein 08/04/2013, 1:26 AM

## 2013-08-04 NOTE — Consult Note (Signed)
Unassigned Consult  Reason for Consult: Crohn's disease and SBO Referring Physician: [facility].  Bruce [name] HPI: This is a 63 year old male with a PMH of Crohn's disease, HTN, history of intestinal perforation, and HTN admitted for a recurrent SBO.  Yesterday he had a sudden onset of abdominal pain that resulted in vomiting and diaphoresis.  As a result of the pain he presented to the ER and he was identified to have a high grade SBO.  The patient was recently in the hospital from 4/26-4/28 for a high grade obstruction that was managed conservatively.  He had one prior episode of an SBO 5 years ago and he reports being diagnosed with Crohn's disease 10 year prior.  Per the patient's report, Dr. [name], his primary GI, only treats him with Pentasa.  He denies taking any steroids, AZA/6-MP, or anti-TNF in the past.  Past Medical History  Diagnosis Date  . Crohn's disease   . Hypertension   . Sleep apnea   . Prostate enlargement     Past Surgical History  Procedure Laterality Date  . Nasal sinus surgery    . Cholecystectomy    . Colonoscopy    . 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 Seventh nerve face    . Colonoscopy  01/19/2011    Procedure: COLONOSCOPY;  Surgeon: Rogene Houston, MD;  Location: [facility];  Service: Endoscopy;  Laterality: N/A;  9:30 am  . Colon surgery      Family History  Problem Relation Age of Onset  . Colon cancer Mother   . Colon cancer Sister   . Healthy Sister   . Healthy Sister   . Crohn's disease Daughter   . Healthy Son   . Arthritis Other   . Heart disease Other   . Cancer Other     Colon, Uterine and Prostate Cancer    Social History:  reports that he quit smoking about 7 years ago. His smoking use included Cigarettes. He has a 120 pack-year smoking history. He has never used smokeless tobacco. He reports that he does not drink alcohol or use illicit drugs.  Allergies:  Allergies  Allergen Reactions  . Coconut  Flavor Other (See Comments)    Does not eat because of crohn's     Medications:  Scheduled: . ciprofloxacin  400 mg Intravenous Q12H  . heparin  5,000 Units Subcutaneous 3 times per day  . levothyroxine  87.5 mcg Intravenous Daily  . metronidazole  500 mg Intravenous 4 times per day  . sodium chloride  3 mL Intravenous Q12H   Continuous: . 0.9 % NaCl with KCl 20 mEq / L 100 mL/hr at 08/04/13 0230    Results for orders placed during the hospital encounter of 08/03/13 (from the past 24 hour(s))  CBC WITH DIFFERENTIAL     Status: Abnormal   Collection Time    08/03/13 10:58 PM      Result Value Ref Range   WBC 14.5 (*) 4.0 - 10.5 K/uL   RBC 4.86  4.22 - 5.81 MIL/uL   Hemoglobin 12.4 (*) 13.0 - 17.0 g/dL   HCT 38.7 (*) 39.0 - 52.0 %   MCV 79.6  78.0 - 100.0 fL   MCH 25.5 (*) 26.0 - 34.0 pg   MCHC 32.0  30.0 - 36.0 g/dL   RDW 16.1 (*) 11.5 - 15.5 %   Platelets 450 (*) 150 - 400 K/uL   Neutrophils Relative %  81 (*) 43 - 77 %   Neutro Abs 11.8 (*) 1.7 - 7.7 K/uL   Lymphocytes Relative 11 (*) 12 - 46 %   Lymphs Abs 1.6  0.7 - 4.0 K/uL   Monocytes Relative 7  3 - 12 %   Monocytes Absolute 1.0  0.1 - 1.0 K/uL   Eosinophils Relative 1  0 - 5 %   Eosinophils Absolute 0.1  0.0 - 0.7 K/uL   Basophils Relative 0  0 - 1 %   Basophils Absolute 0.0  0.0 - 0.1 K/uL  COMPREHENSIVE METABOLIC PANEL     Status: Abnormal   Collection Time    08/03/13 10:58 PM      Result Value Ref Range   Sodium 139  137 - 147 mEq/L   Potassium 4.3  3.7 - 5.3 mEq/L   Chloride 103  96 - 112 mEq/L   CO2 23  19 - 32 mEq/L   Glucose, Bld 161 (*) 70 - 99 mg/dL   BUN 14  6 - 23 mg/dL   Creatinine, Ser 1.34  0.50 - 1.35 mg/dL   Calcium 9.4  8.4 - 10.5 mg/dL   Total Protein 7.7  6.0 - 8.3 g/dL   Albumin 3.8  3.5 - 5.2 g/dL   AST 24  0 - 37 U/L   ALT 22  0 - 53 U/L   Alkaline Phosphatase 112  39 - 117 U/L   Total Bilirubin 0.4  0.3 - 1.2 mg/dL   GFR calc non Af Amer 54 (*) >90 mL/min   GFR calc Af Amer 63  (*) >90 mL/min  LIPASE, BLOOD     Status: None   Collection Time    08/03/13 10:58 PM      Result Value Ref Range   Lipase 47  11 - 59 U/L  LACTIC ACID, PLASMA     Status: Abnormal   Collection Time    08/03/13 10:58 PM      Result Value Ref Range   Lactic Acid, Venous 2.3 (*) 0.5 - 2.2 mmol/L  CBC     Status: Abnormal   Collection Time    08/04/13  1:56 AM      Result Value Ref Range   WBC 17.2 (*) 4.0 - 10.5 K/uL   RBC 5.16  4.22 - 5.81 MIL/uL   Hemoglobin 13.2  13.0 - 17.0 g/dL   HCT 41.3  39.0 - 52.0 %   MCV 80.0  78.0 - 100.0 fL   MCH 25.6 (*) 26.0 - 34.0 pg   MCHC 32.0  30.0 - 36.0 g/dL   RDW 16.1 (*) 11.5 - 15.5 %   Platelets 470 (*) 150 - 400 K/uL  CREATININE, SERUM     Status: Abnormal   Collection Time    08/04/13  1:56 AM      Result Value Ref Range   Creatinine, Ser 1.32  0.50 - 1.35 mg/dL   GFR calc non Af Amer 55 (*) >90 mL/min   GFR calc Af Amer 64 (*) >90 mL/min  TROPONIN I     Status: None   Collection Time    08/04/13  1:56 AM      Result Value Ref Range   Troponin I <0.30  <0.30 ng/mL  TSH     Status: None   Collection Time    08/04/13  1:56 AM      Result Value Ref Range   TSH 2.200  0.350 - 4.500 uIU/mL  MRSA  PCR SCREENING     Status: None   Collection Time    08/04/13  2:26 AM      Result Value Ref Range   MRSA by PCR NEGATIVE  NEGATIVE  GLUCOSE, CAPILLARY     Status: Abnormal   Collection Time    08/04/13  2:26 AM      Result Value Ref Range   Glucose-Capillary 189 (*) 70 - 99 mg/dL  COMPREHENSIVE METABOLIC PANEL     Status: Abnormal   Collection Time    08/04/13  5:50 AM      Result Value Ref Range   Sodium 136 (*) 137 - 147 mEq/L   Potassium 5.1  3.7 - 5.3 mEq/L   Chloride 102  96 - 112 mEq/L   CO2 22  19 - 32 mEq/L   Glucose, Bld 167 (*) 70 - 99 mg/dL   BUN 19  6 - 23 mg/dL   Creatinine, Ser 1.25  0.50 - 1.35 mg/dL   Calcium 8.5  8.4 - 10.5 mg/dL   Total Protein 7.1  6.0 - 8.3 g/dL   Albumin 3.3 (*) 3.5 - 5.2 g/dL   AST 26  0  - 37 U/L   ALT 25  0 - 53 U/L   Alkaline Phosphatase 100  39 - 117 U/L   Total Bilirubin 0.3  0.3 - 1.2 mg/dL   GFR calc non Af Amer 59 (*) >90 mL/min   GFR calc Af Amer 69 (*) >90 mL/min  CBC WITH DIFFERENTIAL     Status: Abnormal   Collection Time    08/04/13  5:50 AM      Result Value Ref Range   WBC 16.5 (*) 4.0 - 10.5 K/uL   RBC 4.81  4.22 - 5.81 MIL/uL   Hemoglobin 12.1 (*) 13.0 - 17.0 g/dL   HCT 38.4 (*) 39.0 - 52.0 %   MCV 79.8  78.0 - 100.0 fL   MCH 25.2 (*) 26.0 - 34.0 pg   MCHC 31.5  30.0 - 36.0 g/dL   RDW 16.2 (*) 11.5 - 15.5 %   Platelets 444 (*) 150 - 400 K/uL   Neutrophils Relative % 89 (*) 43 - 77 %   Neutro Abs 14.6 (*) 1.7 - 7.7 K/uL   Lymphocytes Relative 4 (*) 12 - 46 %   Lymphs Abs 0.6 (*) 0.7 - 4.0 K/uL   Monocytes Relative 7  3 - 12 %   Monocytes Absolute 1.2 (*) 0.1 - 1.0 K/uL   Eosinophils Relative 0  0 - 5 %   Eosinophils Absolute 0.0  0.0 - 0.7 K/uL   Basophils Relative 0  0 - 1 %   Basophils Absolute 0.0  0.0 - 0.1 K/uL  TROPONIN I     Status: None   Collection Time    08/04/13  5:50 AM      Result Value Ref Range   Troponin I <0.30  <0.30 ng/mL  LACTIC ACID, PLASMA     Status: Abnormal   Collection Time    08/04/13  5:50 AM      Result Value Ref Range   Lactic Acid, Venous 2.3 (*) 0.5 - 2.2 mmol/L  GLUCOSE, CAPILLARY     Status: Abnormal   Collection Time    08/04/13  8:23 AM      Result Value Ref Range   Glucose-Capillary 154 (*) 70 - 99 mg/dL  TROPONIN I     Status: None   Collection Time  08/04/13  1:35 PM      Result Value Ref Range   Troponin I <0.30  <0.30 ng/mL     Ct Abdomen Pelvis W Contrast  08/04/2013   CLINICAL DATA:  Abdominal pain. History of small bowel obstruction. History of Crohn's disease.  EXAM: CT ABDOMEN AND PELVIS WITH CONTRAST  TECHNIQUE: Multidetector CT imaging of the abdomen and pelvis was performed using the standard protocol following bolus administration of intravenous contrast.  CONTRAST:  135m OMNIPAQUE  IOHEXOL 300 MG/ML  SOLN  COMPARISON:  04/16/2013  FINDINGS: BODY WALL: Fatty umbilical and periumbilical hernias. Fatty enlargement of the bilateral inguinal canals.  LOWER CHEST: Mild atelectasis at the left base.  ABDOMEN/PELVIS:  Liver: No focal abnormality.  Biliary: Cholecystectomy.  Pancreas: Unremarkable.  Spleen: In place of the spleen, there are multiple homogeneously enhancing nodules throughout the peritoneal space, consistent with splenosis.  Adrenals: Unremarkable.  Kidneys and ureters: There are bilateral indeterminate renal cortical lesions. On the right, there is a 2 cm lesion in the posterior interpolar kidney that measures greater than water density at 50 Hounsfield units There is no definitive deenhancement. In the left kidney there are two somewhat ill-defined and mildly hyperdense lesions both measuring approximately 12 mm in the interpolar region.  Bladder: Unremarkable.  Reproductive: Mild prostate enlargement.  Bowel: Multiple segments of dilated and fluid-filled small bowel leading up to the terminal ileum where there is abrupt transition point in a region of wall thickening. The mucosa in this region is avidly enhancing. There is alternating narrow and dilated segments of the terminal ileum. Small bowel obstruction causes reactive edema in the associated mesenteries and in the neighboring peritoneal compartment. No evidence of bowel perforation or devascularization. Nasogastric tube tip in the distal ligament. No pericecal inflammation.  Retroperitoneum: No mass or adenopathy.  Peritoneum: Splenosis as above.  Vascular: No acute abnormality.  OSSEOUS: No acute abnormalities.  IMPRESSION: 1. Small bowel obstruction at the level of the terminal ileum where there is active Crohn's enteritis. There could be a superimposed stenosis at this level. 2. Small bilateral indeterminate renal lesions, complex cyst versus solid mass. Due to patient size, ultrasound would be challenging; recommend  followup outpatient MRI (when the patient is able to cooperate with breathing instructions). 3. Splenosis.   Electronically Signed   By: JJorje GuildM.D.   On: 08/04/2013 05:26   Dg Abd Acute W/chest  08/04/2013   CLINICAL DATA:  Abdominal pain and vomiting, history of small bowel obstruction.  EXAM: ACUTE ABDOMEN SERIES (ABDOMEN 2 VIEW & CHEST 1 VIEW)  COMPARISON:  Abdominal radiograph June 29, 2013.  FINDINGS: Cardiomediastinal silhouette is unremarkable. Strandy densities in left lung base. No pleural effusions. No pneumothorax. Soft tissue planes and included osseous structures are nonsuspicious.  Gas distended small bowel to 4.4 cm with paucity of large bowel gas. Small bowel air-fluid levels at relatively uniform level on upright view. No intraperitoneal free air.  No intra-abdominal mass effect or pathologic calcifications. Surgical clips in the right abdomen may reflect cholecystectomy. Phleboliths in the pelvis. Soft tissue planes and included osseous structures are nonsuspicious.  IMPRESSION: No acute cardiopulmonary process.  Recurrent high-grade small bowel obstruction.   Electronically Signed   By: CElon Alas  On: 08/04/2013 00:14    ROS:  As stated above in the HPI otherwise negative.  Blood pressure 113/74, pulse 96, temperature 97.7 F (36.5 C), temperature source Oral, resp. rate 18, height 6' (1.829 m), weight 256 lb 6.3 oz (116.3 kg),  SpO2 98.00%.    PE: Gen: NAD, Alert and Oriented HEENT:  [place]/AT, EOMI Neck: Supple, no LAD Lungs: CTA Bilaterally CV: RRR without M/G/R ABM: Soft, nontender, mildly tympanic, +BS Ext: No C/C/E  Assessment/Plan: 1) Crohn's disease. 2) SBO.   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  The current CT scan does reveal that he has inflammation and edema in the area.  He most likely has active Crohn's inflammation, but underlying he has significant  fibrosis.  He denies being on steroids in the past, but I will start him on the medication.  Whether it is soon or later, a resection will be required, and at that time, post-surgically, it will be prudent to start him on an anti-TNF or immunomodulator to reduce the chance of recurrence at the anastamosis.  Plan: 1) Continue with NG tube. 2) Treat with steroids. 3) If no better, then surgical intervention is required.  Beryle Beams 08/04/2013, 3:01 PM

## 2013-08-04 NOTE — Progress Notes (Signed)
ANTIBIOTIC CONSULT NOTE - INITIAL  Pharmacy Consult for Cipro Indication: Crohn's ileitis  Allergies  Allergen Reactions  . Coconut Flavor Other (See Comments)    Does not eat because of crohn's    Patient Measurements: Height: 6' (182.9 cm) Weight: 256 lb 6.3 oz (116.3 kg) IBW/kg (Calculated) : 77.6 Vital Signs: Temp: 97.7 F (36.5 C) (06/02 1200) Temp src: Oral (06/02 1200) BP: 101/82 mmHg (06/02 1200) Pulse Rate: 94 (06/02 1200) Intake/Output from previous day: 06/01 0701 - 06/02 0700 In: 2180 [I.V.:1450; NG/GT:730] Out: 2250 [Emesis/NG output:1450] Intake/Output from this shift: Total I/O In: 563 [I.V.:503; NG/GT:60] Out: 1050 [Urine:400; Emesis/NG output:650]  Labs:  Recent Labs  08/03/13 2258 08/04/13 0156 08/04/13 0550  WBC 14.5* 17.2* 16.5*  HGB 12.4* 13.2 12.1*  PLT 450* 470* 444*  CREATININE 1.34 1.32 1.25   Estimated Creatinine Clearance: 78.6 ml/min (by C-G formula based on Cr of 1.25). No results found for this basename: VANCOTROUGH, Corlis Leak, VANCORANDOM, [place], GENTPEAK, GENTRANDOM, TOBRATROUGH, TOBRAPEAK, TOBRARND, AMIKACINPEAK, AMIKACINTROU, AMIKACIN,  in the last 72 hours   Microbiology: Recent Results (from the past 720 hour(s))  MRSA PCR SCREENING     Status: None   Collection Time    08/04/13  2:26 AM      Result Value Ref Range Status   MRSA by PCR NEGATIVE  NEGATIVE Final   Comment:            The GeneXpert MRSA Assay (FDA     approved for NASAL specimens     only), is one component of a     comprehensive MRSA colonization     surveillance program. It is not     intended to diagnose MRSA     infection nor to guide or     monitor treatment for     MRSA infections.    Medical History: Past Medical History  Diagnosis Date  . Crohn's disease   . Hypertension   . Sleep apnea   . Prostate enlargement     Medications:  Anti-infectives   Start     Dose/Rate Route Frequency Ordered Stop   08/04/13 1300  metroNIDAZOLE  (FLAGYL) IVPB 500 mg     500 mg 100 mL/hr over 60 Minutes Intravenous 4 times per day 08/04/13 1213       Assessment: 63 YOM with history of chrohn's disease and s/p multiple bowel surgeries admitted with abdominal pain and non-operative SBO/Crohn's ileitis to start Cipro per pharmacy dosing and flagyl per MD dosing. WBC is 16.5. Afebrile. SCr 1.25 with estimated CrCl~ 70-53m/min.   Goal of Therapy:  Clinical resolution of infection  Plan:  Cipro 400 mg IV every 12 hours.  Follow up renal function, culture data, and clinical status.   JSloan Leiter PharmD, BCPS Clinical Pharmacist [phone]/04/2013,12:47 PM

## 2013-08-04 NOTE — Progress Notes (Signed)
[facility]  Bruce [name] NIO:[id number] DOB: Nov 17, 1950 DOA: 08/03/2013 PCP: [name], MD  Time spent :  Brief narrative: 63 y.o. year old male with prior hx/o crohns disease (s/p multiple bowel surgeries), HTN who presented with abdominal pain. Pt stated that he had severe sudden onset of abdominal pain, non bloody emesis and diarrhea while eating earlier today.  Has also had severe generalized abd pain. Recently admission 1 month prior for high grade SBO. Surgery was consulted and an NGT was placed. Surgery recommended non operative management. Pt/wife felt that pain was significantly worse than previous admission, though pain much improved s/p NGT in ER. Pain 9/10-->4/10. No fevers or chills.   In the ER he was found ti have tachycardia with rates in the 120s. WBC 14.5. Hgb 12.4. Cr 1.34. KUB w/ high grade SBO. Lactate 2.3. Lipase WNL.   HPI/Subjective: Sts abdominal pain much improved. Wants ice chips. Still with right side abdominal  Assessment/Plan: Active Problems:   Crohn's ileitis /SBO (small bowel obstruction) -cont supportive care (IVFs , bowel rest with NGT) -cont anbx's -begin Budesonide -GI consult -NG output appears feculent -Dr. [name] is GI doctor in [place]    Metabolic acidosis -likely from [place] but monitor for bowel ischemia    Acute respiratory failure with hypoxia/Sleep apnea -likely from mechanical issues from recent distended abdomen and SBO -cont O2 -initial CXR unremarkable -CPAP per respiratory    Dehydration/tachycardia -cont IVFs    HTN (hypertension) -BP soft -prn Hydralazine    Hypothyroidism -convert to IV dosing    CKD (chronic kidney disease), stage III   DVT prophylaxis: Subcutaneous heparin Code Status: Full Family Communication: No family at bedside Disposition Plan/Expected LOS: Step down   Consultants: General surgery Gastroenterology  Procedures: None  Antibiotics: Cipro  6/2 >>> Flagyl 6/2 >>>  Objective: Blood pressure 100/68, pulse 112, temperature 98.9 F (37.2 C), temperature source Oral, resp. rate 8, height 6' (1.829 m), weight 256 lb 6.3 oz (116.3 kg), SpO2 96.00%.  Intake/Output Summary (Last 24 hours) at 08/04/13 1201 Last data filed at 08/04/13 1000  Gross per 24 hour  Intake   2413 ml  Output   3200 ml  Net   -787 ml     Exam: Follow up exam completed. Patient admitted at 1:41 AM today  Scheduled Meds:  Scheduled Meds: . heparin  5,000 Units Subcutaneous 3 times per day  . sodium chloride  3 mL Intravenous Q12H   Continuous Infusions: . 0.9 % NaCl with KCl 20 mEq / L 100 mL/hr at 08/04/13 0230    Data Reviewed: Basic Metabolic Panel:  Recent Labs Lab 08/03/13 2258 08/04/13 0156 08/04/13 0550  NA 139  --  136*  K 4.3  --  5.1  CL 103  --  102  CO2 23  --  22  GLUCOSE 161*  --  167*  BUN 14  --  19  CREATININE 1.34 1.32 1.25  CALCIUM 9.4  --  8.5   Liver Function Tests:  Recent Labs Lab 08/03/13 2258 08/04/13 0550  AST 24 26  ALT 22 25  ALKPHOS 112 100  BILITOT 0.4 0.3  PROT 7.7 7.1  ALBUMIN 3.8 3.3*    Recent Labs Lab 08/03/13 2258  LIPASE 47   No results found for this basename: AMMONIA,  in the last 168 hours CBC:  Recent Labs Lab 08/03/13 2258 08/04/13 0156 08/04/13 0550  WBC 14.5* 17.2* 16.5*  NEUTROABS  11.8*  --  14.6*  HGB 12.4* 13.2 12.1*  HCT 38.7* 41.3 38.4*  MCV 79.6 80.0 79.8  PLT 450* 470* 444*   Cardiac Enzymes:  Recent Labs Lab 08/04/13 0156 08/04/13 0550  TROPONINI <0.30 <0.30   BNP (last 3 results) No results found for this basename: PROBNP,  in the last 8760 hours CBG:  Recent Labs Lab 08/04/13 0226 08/04/13 0823  GLUCAP 189* 154*    Recent Results (from the past 240 hour(s))  MRSA PCR SCREENING     Status: None   Collection Time    08/04/13  2:26 AM      Result Value Ref Range Status   MRSA by PCR NEGATIVE  NEGATIVE Final   Comment:            The  GeneXpert MRSA Assay (FDA     approved for NASAL specimens     only), is one component of a     comprehensive MRSA colonization     surveillance program. It is not     intended to diagnose MRSA     infection nor to guide or     monitor treatment for     MRSA infections.     Studies:  Recent x-ray studies have been reviewed in detail by the Attending Physician       Erin Hearing, ANP [facility] Office  [phone] Pager [phone]   **If unable to reach the above provider after paging please contact the [place] @ [phone]  On-Call/Text Page:      [email or web address]      password [email or web address]  If 7PM-7AM, please contact night-coverage [email or web address] Password [email or web address] 08/04/2013, 12:01 PM   LOS: 1 day   Examined patient with ANP Bryson Ha discussed assessment and plan and agree with plan. Discuss plan with patient and answered all questions Per patient care greater than 35 minutes

## 2013-08-04 NOTE — H&P (Addendum)
Hospitalist Admission History and Physical  Patient name: Bruce [name] Medical record number: [id number] Date of birth: Apr 02, 1950 Age: 63 y.o. Gender: male  Primary Care Provider: [name], MD  Chief Complaint: SBO  History of Present Illness:This is a 63 y.o. year old male with prior hx/o chrons disease s/p multiple bowel surgeries, HTN presenting with SBO. Pt states that he had severe sudden onset of abdominal pain, vomiting and diaphoresis while eating earlier today. Emesis NBNB. Has also had severe generalized abd pain. Noted to  Have been recently admitted for high grade SBO 4/26-4/28. Surgery consulted. NGT. Non operative management. Pt/wife feels that pain is significantly worse than previous, though pain much improved s/p NGT in ER. Pain 9/10-->4/10. No fevers or chills.  No CP, SOB. No diarrhea, dysuria. Denies any recent strenuous activity.  Presented to ER hemodynamically stable apart from intermittent tachycardia into 120s in setting of NGT placement. WBC 14.5. Hgb 12.4. Cr 1.34. KUB w/ high grade SBO. Lactate 2.3. Lipase WNL. Gen surg contacted by EDP.   Assessment and Plan: Bruce [name] is a 63 y.o. year old male presenting with SBO.  Active Problems:   SBO (small bowel obstruction)   SBO: Gen surg consult pending. NGT placed. Bowel rest. IVFs. Noted worse pain above recent admission, though improving with NGT. Family discussed possible imaging. Will defer to surg in consultation with family, unless clinical picture worsens over night. Stepdown bed. Dry clinically. Hydrate. Trend WBC. Noted diaphoresis in setting of high grade SBO. Suspect noncardiac source given sxs history. EKG w/ sinus tachy w/o significant TW changes. Will cycle CEs. Follow closely.   Leukocytosis: Unclear etiology. ? Reactive in setting of SBO. No active signs of infection. No free air on KUB. CXR w/o infiltrate. Check UA. Start on broad spectrum abx and panculture if pt spikes fever.   HTN: hold  oral meds. Prn hydralazine   FEN/GI: NPO. Hold mesalamine in the interim in setting of SBO. Hydrate. Trend K.  Prophylaxis: sub q heparin  Disposition: Pending further evaluation  Code Status:Full Code    Patient Active Problem List   Diagnosis Date Noted  . SBO (small bowel obstruction) 06/28/2013  . Nausea and vomiting 06/28/2013  . Unspecified constipation 05/20/2012  . HTN (hypertension) 03/17/2012  . Sleep apnea 03/17/2012  . Hypothyroidism 03/17/2012  . Crohn disease 03/17/2012  . Obesity 03/17/2012  . BPH (benign prostatic hypertrophy) 03/17/2012   Past Medical History: Past Medical History  Diagnosis Date  . Crohn's disease   . Hypertension   . Sleep apnea   . Prostate enlargement     Past Surgical History: Past Surgical History  Procedure Laterality Date  . Nasal sinus surgery    . Cholecystectomy    . Colonoscopy    . 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 Seventh nerve face    . Colonoscopy  01/19/2011    Procedure: COLONOSCOPY;  Surgeon: Rogene Houston, MD;  [facility];  Service: Endoscopy;  Laterality: N/A;  9:30 am  . Colon surgery      Social History: History   Social History  . Marital Status: Married    Spouse Name: N/A    Number of Children: N/A  . Years of Education: N/A   Occupational History  . retired    Social History Main Topics  . Smoking status: Former Smoker -- 4.00 packs/day for 30 years    Types: Cigarettes    Quit date: 12/24/2005  .  Smokeless tobacco: Never Used  . Alcohol Use: No  . Drug Use: No  . Sexual Activity: Yes   Other Topics Concern  . None   Social History Narrative  . None    Family History: Family History  Problem Relation Age of Onset  . Colon cancer Mother   . Colon cancer Sister   . Healthy Sister   . Healthy Sister   . Crohn's disease Daughter   . Healthy Son   . Arthritis Other   . Heart disease Other   . Cancer Other     Colon, Uterine and Prostate Cancer     Allergies: Allergies  Allergen Reactions  . Coconut Flavor Other (See Comments)    Does not eat because of crohn's     Current Facility-Administered Medications  Medication Dose Route Frequency Provider Last Rate Last Dose  . 0.9 % NaCl with KCl 20 mEq/ L  infusion   Intravenous Continuous Shanda Howells, MD      . heparin injection 5,000 Units  5,000 Units Subcutaneous 3 times per day Shanda Howells, MD      . sodium chloride 0.9 % injection 3 mL  3 mL Intravenous Q12H Shanda Howells, MD       Current Outpatient Prescriptions  Medication Sig Dispense Refill  . aspirin 81 MG tablet Take 81 mg by mouth daily.        Marland Kitchen dutasteride (AVODART) 0.5 MG capsule Take 0.5 mg by mouth daily.        Marland Kitchen levothyroxine (SYNTHROID, LEVOTHROID) 175 MCG tablet Take 175 mcg by mouth daily.        Marland Kitchen lisinopril (PRINIVIL,ZESTRIL) 10 MG tablet Take 10 mg by mouth daily.      . mesalamine (PENTASA) 500 MG CR capsule Take 1,000 mg by mouth 4 (four) times daily.      . polyethylene glycol (MIRALAX / GLYCOLAX) packet Take 17 g by mouth daily.         Review Of Systems: 12 point ROS negative except as noted above in HPI.  Physical Exam: Filed Vitals:   08/04/13 0048  BP: 122/89  Pulse: 127  Temp:   Resp: 18    General: morbidly obese, mild distress  HEENT: PERRLA and extra ocular movement intact, NGT in place to LIS Heart: S1, S2 normal, no murmur, rub or gallop, regular rate and rhythm Lungs: clear to auscultation, no wheezes or rales and unlabored breathing Abdomen: obese abdomen, + umbilical hernia, + mild-moderate generalized abd TTP Extremities: extremities normal, atraumatic, no cyanosis or edema Skin:no rashes, no ecchymoses Neurology: normal without focal findings  Labs and Imaging: Lab Results  Component Value Date/Time   NA 139 08/03/2013 10:58 PM   K 4.3 08/03/2013 10:58 PM   CL 103 08/03/2013 10:58 PM   CO2 23 08/03/2013 10:58 PM   BUN 14 08/03/2013 10:58 PM   CREATININE 1.34 08/03/2013  10:58 PM   CREATININE 1.32 04/14/2013  2:37 PM   GLUCOSE 161* 08/03/2013 10:58 PM   Lab Results  Component Value Date   WBC 14.5* 08/03/2013   HGB 12.4* 08/03/2013   HCT 38.7* 08/03/2013   MCV 79.6 08/03/2013   PLT 450* 08/03/2013   Urinalysis      Dg Abd Acute W/chest  08/04/2013   CLINICAL DATA:  Abdominal pain and vomiting, history of small bowel obstruction.  EXAM: ACUTE ABDOMEN SERIES (ABDOMEN 2 VIEW & CHEST 1 VIEW)  COMPARISON:  Abdominal radiograph June 29, 2013.  FINDINGS: Cardiomediastinal silhouette is  unremarkable. Strandy densities in left lung base. No pleural effusions. No pneumothorax. Soft tissue planes and included osseous structures are nonsuspicious.  Gas distended small bowel to 4.4 cm with paucity of large bowel gas. Small bowel air-fluid levels at relatively uniform level on upright view. No intraperitoneal free air.  No intra-abdominal mass effect or pathologic calcifications. Surgical clips in the right abdomen may reflect cholecystectomy. Phleboliths in the pelvis. Soft tissue planes and included osseous structures are nonsuspicious.  IMPRESSION: No acute cardiopulmonary process.  Recurrent high-grade small bowel obstruction.   Electronically Signed   By: Elon Alas   On: 08/04/2013 00:14           Shanda Howells MD  Pager: [phone]

## 2013-08-04 NOTE — Progress Notes (Signed)
Pt refused CPap For the night stating he gave his home machine away because he would not wear it. Will continue to monitor .

## 2013-08-04 NOTE — Progress Notes (Signed)
Patient ID: Bruce [name], male   DOB: 1950/04/01, 63 y.o.   MRN: [id number]  He reports less pain since NG and rehydration.  Denies flatus Still tachycardic. Abdomen is soft but with moderate tenderness and guarding in the RLQ  CT scan consistent with a Crohn's flare although he also probably has some degree of chronic stricturing given other admission for pSBO.  Would continue conservative treatment, ?steroids for now

## 2013-08-05 ENCOUNTER — Inpatient Hospital Stay (HOSPITAL_COMMUNITY): Payer: 59

## 2013-08-05 LAB — CBC
HCT: 33.4 % — ABNORMAL LOW (ref 39.0–52.0)
Hemoglobin: 10.1 g/dL — ABNORMAL LOW (ref 13.0–17.0)
MCH: 24.8 pg — ABNORMAL LOW (ref 26.0–34.0)
MCHC: 30.2 g/dL (ref 30.0–36.0)
MCV: 82.1 fL (ref 78.0–100.0)
Platelets: 381 10*3/uL (ref 150–400)
RBC: 4.07 MIL/uL — ABNORMAL LOW (ref 4.22–5.81)
RDW: 16.3 % — ABNORMAL HIGH (ref 11.5–15.5)
WBC: 8.5 10*3/uL (ref 4.0–10.5)

## 2013-08-05 LAB — COMPREHENSIVE METABOLIC PANEL
ALT: 17 U/L (ref 0–53)
AST: 19 U/L (ref 0–37)
Albumin: 2.8 g/dL — ABNORMAL LOW (ref 3.5–5.2)
Alkaline Phosphatase: 82 U/L (ref 39–117)
BUN: 21 mg/dL (ref 6–23)
CO2: 23 mEq/L (ref 19–32)
Calcium: 8.2 mg/dL — ABNORMAL LOW (ref 8.4–10.5)
Chloride: 104 mEq/L (ref 96–112)
Creatinine, Ser: 1.07 mg/dL (ref 0.50–1.35)
GFR calc Af Amer: 83 mL/min — ABNORMAL LOW (ref >90)
GFR calc non Af Amer: 71 mL/min — ABNORMAL LOW (ref >90)
Glucose, Bld: 178 mg/dL — ABNORMAL HIGH (ref 70–99)
Potassium: 4.6 mEq/L (ref 3.7–5.3)
Sodium: 138 mEq/L (ref 137–147)
Total Bilirubin: 0.3 mg/dL (ref 0.3–1.2)
Total Protein: 6.2 g/dL (ref 6.0–8.3)

## 2013-08-05 LAB — URINE CULTURE
Colony Count: NO GROWTH
Culture: NO GROWTH

## 2013-08-05 LAB — GLUCOSE, CAPILLARY
Glucose-Capillary: 135 mg/dL — ABNORMAL HIGH (ref 70–99)
Glucose-Capillary: 116 mg/dL — ABNORMAL HIGH (ref 70–99)
Glucose-Capillary: 149 mg/dL — ABNORMAL HIGH (ref 70–99)

## 2013-08-05 LAB — LACTIC ACID, PLASMA: Lactic Acid, Venous: 1 mmol/L (ref 0.5–2.2)

## 2013-08-05 MED ORDER — POTASSIUM CHLORIDE IN NACL 20-0.9 MEQ/L-% IV SOLN
INTRAVENOUS | Status: AC
  Administered 2013-08-05: 18:00:00 via INTRAVENOUS
  Filled 2013-08-05 (×3): qty 1000

## 2013-08-05 MED ORDER — SODIUM CHLORIDE 0.9 % IJ SOLN
10.0000 mL | INTRAMUSCULAR | Status: DC | PRN
  Administered 2013-08-06 – 2013-08-17 (×9): 10 mL

## 2013-08-05 MED ORDER — FAT EMULSION 20 % IV EMUL
250.0000 mL | INTRAVENOUS | Status: AC
  Administered 2013-08-05: 250 mL via INTRAVENOUS
  Filled 2013-08-05: qty 250

## 2013-08-05 MED ORDER — INSULIN ASPART 100 UNIT/ML ~~LOC~~ SOLN
2.0000 [IU] | SUBCUTANEOUS | Status: DC
  Administered 2013-08-05 – 2013-08-06 (×3): 2 [IU] via SUBCUTANEOUS
  Administered 2013-08-06: 4 [IU] via SUBCUTANEOUS
  Administered 2013-08-06 (×3): 2 [IU] via SUBCUTANEOUS
  Administered 2013-08-06 – 2013-08-07 (×3): 4 [IU] via SUBCUTANEOUS

## 2013-08-05 MED ORDER — TRACE MINERALS CR-CU-F-FE-I-MN-MO-SE-ZN IV SOLN
INTRAVENOUS | Status: AC
  Administered 2013-08-05: 18:00:00 via INTRAVENOUS
  Filled 2013-08-05: qty 1000

## 2013-08-05 NOTE — Progress Notes (Signed)
Unassigned patient Primary GI: Dr. Hildred Laser Subjective: Patient examined; chart reviewed. There has been no improvement in the patient's condition since admission. He has not been able to pass flatus and has not had a BM. He denies having any abdominal pain. He claims he walked the halls earlier today.  Objective: Vital signs in last 24 hours: Temp:  [97.4 F (36.3 C)-99.4 F (37.4 C)] 97.6 F (36.4 C) (06/03 1719) Pulse Rate:  [74-102] 86 (06/03 1719) Resp:  [8-19] 16 (06/03 1719) BP: (89-144)/(60-114) 128/82 mmHg (06/03 1719) SpO2:  [93 %-99 %] 97 % (06/03 1719) Weight:  [104.327 kg (230 lb)] 104.327 kg (230 lb) (06/03 1128) Last BM Date: 08/03/13 (per wife)  Intake/Output from previous day: 06/02 0701 - 06/03 0700 In: 2883 [I.V.:1903; NG/GT:180; IV Piggyback:800] Out: 2450 [Urine:1650; Emesis/NG output:800] Intake/Output this shift:   General appearance: alert, cooperative, appears stated age, no distress and morbidly obese Resp: clear to auscultation bilaterally Cardio: regular rate and rhythm, S1, S2 normal, no murmur, click, rub or gallop GI: soft, morbidly obese, non-tender with NO bowel sounds normal; no masses,  no organomegaly Extremities: extremities normal, atraumatic, no cyanosis or edema  Lab Results:  Recent Labs  08/04/13 0156 08/04/13 0550 08/05/13 0245  WBC 17.2* 16.5* 8.5  HGB 13.2 12.1* 10.1*  HCT 41.3 38.4* 33.4*  PLT 470* 444* 381   BMET  Recent Labs  08/03/13 2258 08/04/13 0156 08/04/13 0550 08/05/13 0245  NA 139  --  136* 138  K 4.3  --  5.1 4.6  CL 103  --  102 104  CO2 23  --  22 23  GLUCOSE 161*  --  167* 178*  BUN 14  --  19 21  CREATININE 1.34 1.32 1.25 1.07  CALCIUM 9.4  --  8.5 8.2*   LFT  Recent Labs  08/05/13 0245  PROT 6.2  ALBUMIN 2.8*  AST 19  ALT 17  ALKPHOS 82  BILITOT 0.3   Studies/Results: X-ray Chest Pa And Lateral   08/05/2013   CLINICAL DATA:  diaphoresis  EXAM: CHEST  2 VIEW  COMPARISON:   Abdominal series dated 08/03/2013  FINDINGS: The heart size and mediastinal contours are within normal limits. Stable mild discoid atelectasis versus scarring left lung base. Lungs otherwise clear. NG tube has been inserted tip not view of this study. No acute osseous abnormalities.  IMPRESSION: No active cardiopulmonary disease.   Electronically Signed   By: Margaree Mackintosh M.D.   On: 08/05/2013 09:32   Ct Abdomen Pelvis W Contrast  08/04/2013   CLINICAL DATA:  Abdominal pain. History of small bowel obstruction. History of Crohn's disease.  EXAM: CT ABDOMEN AND PELVIS WITH CONTRAST  TECHNIQUE: Multidetector CT imaging of the abdomen and pelvis was performed using the standard protocol following bolus administration of intravenous contrast.  CONTRAST:  165m OMNIPAQUE IOHEXOL 300 MG/ML  SOLN  COMPARISON:  04/16/2013  FINDINGS: BODY WALL: Fatty umbilical and periumbilical hernias. Fatty enlargement of the bilateral inguinal canals.  LOWER CHEST: Mild atelectasis at the left base.  ABDOMEN/PELVIS:  Liver: No focal abnormality.  Biliary: Cholecystectomy.  Pancreas: Unremarkable.  Spleen: In place of the spleen, there are multiple homogeneously enhancing nodules throughout the peritoneal space, consistent with splenosis.  Adrenals: Unremarkable.  Kidneys and ureters: There are bilateral indeterminate renal cortical lesions. On the right, there is a 2 cm lesion in the posterior interpolar kidney that measures greater than water density at 50 Hounsfield units There is no definitive deenhancement. In  the left kidney there are two somewhat ill-defined and mildly hyperdense lesions both measuring approximately 12 mm in the interpolar region.  Bladder: Unremarkable.  Reproductive: Mild prostate enlargement.  Bowel: Multiple segments of dilated and fluid-filled small bowel leading up to the terminal ileum where there is abrupt transition point in a region of wall thickening. The mucosa in this region is avidly enhancing.  There is alternating narrow and dilated segments of the terminal ileum. Small bowel obstruction causes reactive edema in the associated mesenteries and in the neighboring peritoneal compartment. No evidence of bowel perforation or devascularization. Nasogastric tube tip in the distal ligament. No pericecal inflammation.  Retroperitoneum: No mass or adenopathy.  Peritoneum: Splenosis as above.  Vascular: No acute abnormality.  OSSEOUS: No acute abnormalities.  IMPRESSION: 1. Small bowel obstruction at the level of the terminal ileum where there is active Crohn's enteritis. There could be a superimposed stenosis at this level. 2. Small bilateral indeterminate renal lesions, complex cyst versus solid mass. Due to patient size, ultrasound would be challenging; recommend followup outpatient MRI (when the patient is able to cooperate with breathing instructions). 3. Splenosis.   Electronically Signed   By: Jorje Guild M.D.   On: 08/04/2013 05:26   Dg Abd Acute W/chest  08/04/2013   CLINICAL DATA:  Abdominal pain and vomiting, history of small bowel obstruction.  EXAM: ACUTE ABDOMEN SERIES (ABDOMEN 2 VIEW & CHEST 1 VIEW)  COMPARISON:  Abdominal radiograph June 29, 2013.  FINDINGS: Cardiomediastinal silhouette is unremarkable. Strandy densities in left lung base. No pleural effusions. No pneumothorax. Soft tissue planes and included osseous structures are nonsuspicious.  Gas distended small bowel to 4.4 cm with paucity of large bowel gas. Small bowel air-fluid levels at relatively uniform level on upright view. No intraperitoneal free air.  No intra-abdominal mass effect or pathologic calcifications. Surgical clips in the right abdomen may reflect cholecystectomy. Phleboliths in the pelvis. Soft tissue planes and included osseous structures are nonsuspicious.  IMPRESSION: No acute cardiopulmonary process.  Recurrent high-grade small bowel obstruction.   Electronically Signed   By: Elon Alas   On: 08/04/2013  00:14   Medications: I have reviewed the patient's current medications.  Assessment/Plan: 1) Small bowel obstruction at the level of the T.I-patient has not had optimal therapy for his small bowel disease and my suspicion is that he will require surgery-he has had a similar admission recently for a high grade obstruction that was treated conservatively. Ambulation has been encouraged. Continue steroids; check TB Gold and TPMT levels to start Imuran in the future.  2) Malnutrition-on TPN.    LOS: 2 days   Bruce [name] 08/05/2013, 7:39 PM

## 2013-08-05 NOTE — Progress Notes (Signed)
INITIAL NUTRITION ASSESSMENT  DOCUMENTATION CODES Per approved criteria  -Obesity Unspecified   INTERVENTION: TPN per Pharmacy Diet advancement per MD RD to continue to monitor When pt is awake and alert, provide "Fiber-Restricted Nutrition Therapy" for management of Crohn's flare-ups  NUTRITION DIAGNOSIS: Inadequate oral intake related to SBO as evidenced by NPO status.   Goal: Pt to meet >/= 90% of their estimated nutrition needs    Monitor:  PO intake, weight trends, labs  Reason for Assessment: Consult for TPN/TNA  63 y.o. male  Admitting Dx: <principal problem not specified>  ASSESSMENT: 63 y.o. year old male with prior hx/o crohns disease (s/p multiple bowel surgeries), HTN who presented with abdominal pain. Pt stated that he had severe sudden onset of abdominal pain, non bloody emesis and diarrhea while eating earlier on day of admission.   Pt asleep at time of visit. PICC just placed. Per pt's family pt has been eating well and maintaining his weight around 250 lbs. Suspect recent weight of 230 lbs is inaccurate. Per pt's daughter (who also has Crohn's) pt would very much benefit from a diet education regarding his Crohn's disease.   Height: Ht Readings from Last 1 Encounters:  08/05/13 6' (1.829 m)    Weight: Wt Readings from Last 1 Encounters:  08/05/13 230 lb (104.327 kg)  ? Accuracy of weight. Pt's 1st admission weight was 256 lbs which wife confirms as pt's usual body weight.   Ideal Body Weight: 178 lbs  % Ideal Body Weight: 144%  Wt Readings from Last 10 Encounters:  08/05/13 230 lb (104.327 kg)  06/30/13 257 lb 0.9 oz (116.6 kg)  04/14/13 262 lb 14.4 oz (119.251 kg)  03/03/13 266 lb (120.657 kg)  12/17/12 268 lb 4 oz (121.677 kg)  06/09/12 267 lb 12.8 oz (121.473 kg)  05/20/12 266 lb (120.657 kg)  04/03/12 265 lb (120.203 kg)  03/17/12 257 lb 1.6 oz (116.62 kg)  01/19/11 265 lb (120.203 kg)    Usual Body Weight: 260 lbs  % Usual Body  Weight: 99%  BMI:  Body mass index is 31.19 kg/(m^2). (Obese)  Estimated Nutritional Needs: Kcal: 2100-2300 Protein: 105-120 grams Fluid: 2.7 L/day  Skin: generalized edema; intact  Diet Order: NPO  EDUCATION NEEDS: -No education needs identified at this time   Intake/Output Summary (Last 24 hours) at 08/05/13 1341 Last data filed at 08/05/13 0900  Gross per 24 hour  Intake   2520 ml  Output   1400 ml  Net   1120 ml    Last BM: 6/1   Labs:   Recent Labs Lab 08/03/13 2258 08/04/13 0156 08/04/13 0550 08/05/13 0245  NA 139  --  136* 138  K 4.3  --  5.1 4.6  CL 103  --  102 104  CO2 23  --  22 23  BUN 14  --  19 21  CREATININE 1.34 1.32 1.25 1.07  CALCIUM 9.4  --  8.5 8.2*  GLUCOSE 161*  --  167* 178*    CBG (last 3)   Recent Labs  08/04/13 0226 08/04/13 0823 08/05/13 1219  GLUCAP 189* 154* 135*    Scheduled Meds: . ciprofloxacin  400 mg Intravenous Q12H  . heparin  5,000 Units Subcutaneous 3 times per day  . insulin aspart  2-6 Units Subcutaneous 6 times per day  . levothyroxine  87.5 mcg Intravenous Daily  . methylPREDNISolone (SOLU-MEDROL) injection  60 mg Intravenous Q12H  . metronidazole  500 mg Intravenous 4 times per  day  . sodium chloride  3 mL Intravenous Q12H    Continuous Infusions: . 0.9 % NaCl with KCl 20 mEq / L 100 mL/hr at 08/05/13 0347  . 0.9 % NaCl with KCl 20 mEq / L    . Marland KitchenTPN (CLINIMIX-E) Adult     And  . fat emulsion      Past Medical History  Diagnosis Date  . Crohn's disease   . Hypertension   . Sleep apnea   . Prostate enlargement     Past Surgical History  Procedure Laterality Date  . Nasal sinus surgery    . Cholecystectomy    . Colonoscopy    . 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 Seventh nerve face    . Colonoscopy  01/19/2011    Procedure: COLONOSCOPY;  Surgeon: Rogene Houston, MD;  Location: [facility];  Service: Endoscopy;  Laterality: N/A;  9:30 am  . Colon surgery      Pryor Ochoa RD, LDN Inpatient Clinical Dietitian Pager: [phone] After Hours Pager: [phone]

## 2013-08-05 NOTE — Progress Notes (Signed)
Subjective: He denies pain this morning No flatus Still with high NG output  Objective: Vital signs in last 24 hours: Temp:  [97.4 F (36.3 C)-99.4 F (37.4 C)] 97.4 F (36.3 C) (06/03 0410) Pulse Rate:  [79-112] 84 (06/03 0600) Resp:  [8-23] 10 (06/03 0600) BP: (89-144)/(61-114) 89/61 mmHg (06/03 0600) SpO2:  [93 %-99 %] 97 % (06/03 0600) Last BM Date: 08/03/13 (per wife)  Intake/Output from previous day: 06/02 0701 - 06/03 0700 In: 2783 [I.V.:1903; NG/GT:180; IV Piggyback:700] Out: 2450 [Urine:1650; Emesis/NG output:800] Intake/Output this shift:    Abdomen soft, much less tender in the RLQ with minimal guarding  Lab Results:   Recent Labs  08/04/13 0550 08/05/13 0245  WBC 16.5* 8.5  HGB 12.1* 10.1*  HCT 38.4* 33.4*  PLT 444* 381   BMET  Recent Labs  08/04/13 0550 08/05/13 0245  NA 136* 138  K 5.1 4.6  CL 102 104  CO2 22 23  GLUCOSE 167* 178*  BUN 19 21  CREATININE 1.25 1.07  CALCIUM 8.5 8.2*   PT/INR No results found for this basename: LABPROT, INR,  in the last 72 hours ABG No results found for this basename: PHART, PCO2, PO2, HCO3,  in the last 72 hours  Studies/Results: Ct Abdomen Pelvis W Contrast  08/04/2013   CLINICAL DATA:  Abdominal pain. History of small bowel obstruction. History of Crohn's disease.  EXAM: CT ABDOMEN AND PELVIS WITH CONTRAST  TECHNIQUE: Multidetector CT imaging of the abdomen and pelvis was performed using the standard protocol following bolus administration of intravenous contrast.  CONTRAST:  19m OMNIPAQUE IOHEXOL 300 MG/ML  SOLN  COMPARISON:  04/16/2013  FINDINGS: BODY WALL: Fatty umbilical and periumbilical hernias. Fatty enlargement of the bilateral inguinal canals.  LOWER CHEST: Mild atelectasis at the left base.  ABDOMEN/PELVIS:  Liver: No focal abnormality.  Biliary: Cholecystectomy.  Pancreas: Unremarkable.  Spleen: In place of the spleen, there are multiple homogeneously enhancing nodules throughout the peritoneal  space, consistent with splenosis.  Adrenals: Unremarkable.  Kidneys and ureters: There are bilateral indeterminate renal cortical lesions. On the right, there is a 2 cm lesion in the posterior interpolar kidney that measures greater than water density at 50 Hounsfield units There is no definitive deenhancement. In the left kidney there are two somewhat ill-defined and mildly hyperdense lesions both measuring approximately 12 mm in the interpolar region.  Bladder: Unremarkable.  Reproductive: Mild prostate enlargement.  Bowel: Multiple segments of dilated and fluid-filled small bowel leading up to the terminal ileum where there is abrupt transition point in a region of wall thickening. The mucosa in this region is avidly enhancing. There is alternating narrow and dilated segments of the terminal ileum. Small bowel obstruction causes reactive edema in the associated mesenteries and in the neighboring peritoneal compartment. No evidence of bowel perforation or devascularization. Nasogastric tube tip in the distal ligament. No pericecal inflammation.  Retroperitoneum: No mass or adenopathy.  Peritoneum: Splenosis as above.  Vascular: No acute abnormality.  OSSEOUS: No acute abnormalities.  IMPRESSION: 1. Small bowel obstruction at the level of the terminal ileum where there is active Crohn's enteritis. There could be a superimposed stenosis at this level. 2. Small bilateral indeterminate renal lesions, complex cyst versus solid mass. Due to patient size, ultrasound would be challenging; recommend followup outpatient MRI (when the patient is able to cooperate with breathing instructions). 3. Splenosis.   Electronically Signed   By: JJorje GuildM.D.   On: 08/04/2013 05:26   Dg Abd Acute W/chest  08/04/2013   CLINICAL DATA:  Abdominal pain and vomiting, history of small bowel obstruction.  EXAM: ACUTE ABDOMEN SERIES (ABDOMEN 2 VIEW & CHEST 1 VIEW)  COMPARISON:  Abdominal radiograph June 29, 2013.  FINDINGS:  Cardiomediastinal silhouette is unremarkable. Strandy densities in left lung base. No pleural effusions. No pneumothorax. Soft tissue planes and included osseous structures are nonsuspicious.  Gas distended small bowel to 4.4 cm with paucity of large bowel gas. Small bowel air-fluid levels at relatively uniform level on upright view. No intraperitoneal free air.  No intra-abdominal mass effect or pathologic calcifications. Surgical clips in the right abdomen may reflect cholecystectomy. Phleboliths in the pelvis. Soft tissue planes and included osseous structures are nonsuspicious.  IMPRESSION: No acute cardiopulmonary process.  Recurrent high-grade small bowel obstruction.   Electronically Signed   By: Elon Alas   On: 08/04/2013 00:14    Anti-infectives: Anti-infectives   Start     Dose/Rate Route Frequency Ordered Stop   08/04/13 1315  ciprofloxacin (CIPRO) IVPB 400 mg     400 mg 200 mL/hr over 60 Minutes Intravenous Every 12 hours 08/04/13 1312     08/04/13 1300  metroNIDAZOLE (FLAGYL) IVPB 500 mg     500 mg 100 mL/hr over 60 Minutes Intravenous 4 times per day 08/04/13 1213        Assessment/Plan:  SBO from Crohn's disease  I agree with Dr. Benson Norway.  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  He is improving from a pain standpoint, but still has no flatus.  He will eventually need a resection but would like to see if he get get further under control from his acute exacerbation. For now, would continue NPO and NG but I will let him have ice chips. Recommend starting TNA  LOS: 2 days    Harl Bowie 08/05/2013

## 2013-08-05 NOTE — Progress Notes (Signed)
PARENTERAL NUTRITION CONSULT NOTE - INITIAL  Pharmacy Consult for TPN Indication: Crohn's ileitis /SBO (small bowel obstruction)  Allergies  Allergen Reactions  . Coconut Flavor Other (See Comments)    Does not eat because of crohn's     Patient Measurements: Height: 6' (182.9 cm) Weight: 256 lb 6.3 oz (116.3 kg) IBW/kg (Calculated) : 77.6 Adjusted Body Weight:   Usual Weight:   Vital Signs: Temp: 97.8 F (36.6 C) (06/03 0849) Temp src: Oral (06/03 0849) BP: 105/74 mmHg (06/03 0800) Pulse Rate: 88 (06/03 0800) Intake/Output from previous day: 06/02 0701 - 06/03 0700 In: 2883 [I.V.:1903; NG/GT:180; IV Piggyback:800] Out: 2450 [Urine:1650; Emesis/NG output:800] Intake/Output from this shift: Total I/O In: 300 [I.V.:300] Out: -   Labs:  Recent Labs  08/04/13 0156 08/04/13 0550 08/05/13 0245  WBC 17.2* 16.5* 8.5  HGB 13.2 12.1* 10.1*  HCT 41.3 38.4* 33.4*  PLT 470* 444* 381     Recent Labs  08/03/13 2258 08/04/13 0156 08/04/13 0550 08/05/13 0245  NA 139  --  136* 138  K 4.3  --  5.1 4.6  CL 103  --  102 104  CO2 23  --  22 23  GLUCOSE 161*  --  167* 178*  BUN 14  --  19 21  CREATININE 1.34 1.32 1.25 1.07  CALCIUM 9.4  --  8.5 8.2*  PROT 7.7  --  7.1 6.2  ALBUMIN 3.8  --  3.3* 2.8*  AST 24  --  26 19  ALT 22  --  25 17  ALKPHOS 112  --  100 82  BILITOT 0.4  --  0.3 0.3   Estimated Creatinine Clearance: 91.8 ml/min (by C-G formula based on Cr of 1.07).    Recent Labs  08/04/13 0226 08/04/13 0823  GLUCAP 189* 154*    Medical History: Past Medical History  Diagnosis Date  . Crohn's disease   . Hypertension   . Sleep apnea   . Prostate enlargement     Medications:  Prescriptions prior to admission  Medication Sig Dispense Refill  . aspirin 81 MG tablet Take 81 mg by mouth daily.        Marland Kitchen dutasteride (AVODART) 0.5 MG capsule Take 0.5 mg by mouth daily.        Marland Kitchen levothyroxine (SYNTHROID, LEVOTHROID) 175 MCG tablet Take 175 mcg by mouth  daily.        Marland Kitchen lisinopril (PRINIVIL,ZESTRIL) 10 MG tablet Take 10 mg by mouth daily.      . mesalamine (PENTASA) 500 MG CR capsule Take 1,000 mg by mouth 4 (four) times daily.      . polyethylene glycol (MIRALAX / GLYCOLAX) packet Take 17 g by mouth daily.          Insulin Requirements in the past 24 hours:  None  Current Nutrition:  NPO  Assessment:  63 y/o M with h/o bowel obstruction a few weeks ago presented 6/1 with complaints of severe abdominal cramping, abdominal distention, and vomiting that started several hours prior to arrival. He has a h/o Crohn's disease, obesity, and multiple abdominal surgeries with adhesions and recurrent small bowel obstructions. Last admission April 2015 for pSBO. Also began having diaphoresis, EKG w/ sinus tachy w/o significant TW changes. 6/2 CT: consistent with a Crohn's flare. Patient likely also has a chronic underlying stricture.  GI: Crohn's ileitis /SBO (small bowel obstruction). Admit albumin 3.8. Currently ungoing supportive care with IVF, NGT, start TPN). GI started pt on steroids for significant underlying  fibrosis. High NG output  Endo: hypothyroid (TSH 2.2) on IV Synthroid. Glucose 189, 154 on steroids. Lytes: WNL Renal: CKD3, BPH, Scr 1.07. Lytes WNL. Pulm: OSA Cards: HTN Hepatobil: LFT's WNL Neuro:  ID:  LA normalized. WBC down to 8.5. Tmax 99.4 on IV Cipro/Flagyl Best Practices: SQ heparin TPN Access: PICC ordered 6/3. TPN day#:0   Nutritional Goals:  ___ kCal, ___ grams of protein per day  Plan:  F/u PICC placement. F/u RD consult for goals. Start Clinimix E 5/15 at 64m/hr + Lipids 17mhr Baseline labs in AM and Mon/Thurs.   Baltasar Twilley S. RoAlford HighlandPharmD, BCUp Health System Portagelinical Staff Pharmacist Pager [phone] Asc LLCoAlford Highland/05/2013,10:19 AM

## 2013-08-05 NOTE — Progress Notes (Signed)
[facility]  Charlotte Brafford WUJ:[id number] DOB: 1950-12-07 DOA: 08/03/2013 PCP: [name], MD  Time spent : 35 mins  Brief narrative: 63 y.o. year old male with prior hx/o crohns disease (s/p multiple bowel surgeries), HTN who presented with abdominal pain. Pt stated that he had severe sudden onset of abdominal pain, non bloody emesis and diarrhea while eating earlier today.  Has also had severe generalized abd pain. Recently admission 1 month prior for high grade SBO. Surgery was consulted and an NGT was placed. Surgery recommended non operative management. Pt/wife felt that pain was significantly worse than previous admission, though pain much improved s/p NGT in ER. Pain 9/10-->4/10. No fevers or chills.   In the ER he was found ti have tachycardia with rates in the 120s. WBC 14.5. Hgb 12.4. Cr 1.34. KUB w/ high grade SBO. Lactate 2.3. Lipase WNL.   HPI/Subjective: No significant abdominal pain- no flatus  Assessment/Plan:    Crohn's ileitis /Small bowel obstruction -cont supportive care (IVFs , bowel rest with NGT) -cont anbx's -cont Budesonide -GI suspects has a combination of acute Crohn's exacerbation in setting of fibrostenosing disease that likely will not respond to med rxn alone and will need resection -insert PICC and begin TNA -Dr. [name] is GI doctor in [place] suggests post op pt will require anti-TNF or immunomodulator therapy to reduce the chance of recurrence at the anastamosis.     Metabolic acidosis -resolved    Acute respiratory failure with hypoxia/Sleep apnea -likely from mechanical issues from recent distended abdomen and SBO -cont O2 -CPAP per respiratory-pt refused saying does not use at home and actually gave away his machine- likely can't achieve adequate seal with NGT in place    Dehydration/tachycardia -cont IVFs-decrease once TNA started    Hypertension -BP soft -prn Hydralazine     Hypothyroidism -cont to IV dosing    CKD (chronic kidney disease), stage III  DVT prophylaxis: Subcutaneous heparin Code Status: Full Family Communication: spoke w/ pt and wife at bedside  Disposition Plan/Expected LOS: Transfer to floor  Consultants: General surgery Gastroenterology  Procedures: None  Antibiotics: Cipro 6/2 >>> Flagyl 6/2 >>>  Objective: Blood pressure 127/79, pulse 91, temperature 97.4 F (36.3 C), temperature source Oral, resp. rate 12, height 6' (1.829 m), weight 230 lb (104.327 kg), SpO2 95.00%.  Intake/Output Summary (Last 24 hours) at 08/05/13 1238 Last data filed at 08/05/13 0900  Gross per 24 hour  Intake   2620 ml  Output   1400 ml  Net   1220 ml   Exam: General: No acute respiratory distress Lungs: Clear to auscultation bilaterally without wheezes or crackles, RA Cardiovascular: Regular rate and rhythm without murmur gallop or rub normal S1 and S2, no peripheral edema or JVD Abdomen: Minimally tender right side, nondistended, soft, bowel sounds absent, no rebound, no ascites, no appreciable mass-NGT to LWS Musculoskeletal: No significant cyanosis, clubbing of bilateral lower extremities  Scheduled Meds:  Scheduled Meds: . ciprofloxacin  400 mg Intravenous Q12H  . heparin  5,000 Units Subcutaneous 3 times per day  . insulin aspart  2-6 Units Subcutaneous 6 times per day  . levothyroxine  87.5 mcg Intravenous Daily  . methylPREDNISolone (SOLU-MEDROL) injection  60 mg Intravenous Q12H  . metronidazole  500 mg Intravenous 4 times per day  . sodium chloride  3 mL Intravenous Q12H   Continuous Infusions: . 0.9 % NaCl with KCl 20 mEq / L 100 mL/hr at 08/05/13 0347  .  0.9 % NaCl with KCl 20 mEq / L    . Marland KitchenTPN (CLINIMIX-E) Adult     And  . fat emulsion      Data Reviewed: Basic Metabolic Panel:  Recent Labs Lab 08/03/13 2258 08/04/13 0156 08/04/13 0550 08/05/13 0245  NA 139  --  136* 138  K 4.3  --  5.1 4.6  CL 103  --  102 104   CO2 23  --  22 23  GLUCOSE 161*  --  167* 178*  BUN 14  --  19 21  CREATININE 1.34 1.32 1.25 1.07  CALCIUM 9.4  --  8.5 8.2*   Liver Function Tests:  Recent Labs Lab 08/03/13 2258 08/04/13 0550 08/05/13 0245  AST 24 26 19   ALT 22 25 17   ALKPHOS 112 100 82  BILITOT 0.4 0.3 0.3  PROT 7.7 7.1 6.2  ALBUMIN 3.8 3.3* 2.8*    Recent Labs Lab 08/03/13 2258  LIPASE 47   CBC:  Recent Labs Lab 08/03/13 2258 08/04/13 0156 08/04/13 0550 08/05/13 0245  WBC 14.5* 17.2* 16.5* 8.5  NEUTROABS 11.8*  --  14.6*  --   HGB 12.4* 13.2 12.1* 10.1*  HCT 38.7* 41.3 38.4* 33.4*  MCV 79.6 80.0 79.8 82.1  PLT 450* 470* 444* 381   Cardiac Enzymes:  Recent Labs Lab 08/04/13 0156 08/04/13 0550 08/04/13 1335  TROPONINI <0.30 <0.30 <0.30   CBG:  Recent Labs Lab 08/04/13 0226 08/04/13 0823 08/05/13 1219  GLUCAP 189* 154* 135*    Recent Results (from the past 240 hour(s))  MRSA PCR SCREENING     Status: None   Collection Time    08/04/13  2:26 AM      Result Value Ref Range Status   MRSA by PCR NEGATIVE  NEGATIVE Final   Comment:            The GeneXpert MRSA Assay (FDA     approved for NASAL specimens     only), is one component of a     comprehensive MRSA colonization     surveillance program. It is not     intended to diagnose MRSA     infection nor to guide or     monitor treatment for     MRSA infections.     Studies:  Recent x-ray studies have been reviewed in detail by the Attending Physician  Erin Hearing, ANP [facility] Office  [phone] Pager [phone]  **If unable to reach the above provider after paging please contact the [place] @ [phone]  On-Call/Text Page:      [email or web address]      password [email or web address]  If 7PM-7AM, please contact night-coverage [email or web address] Password [email or web address] 08/05/2013, 12:38 PM   LOS: 2 days   I have personally examined this patient and reviewed the entire database. I have reviewed the above note, made any necessary  editorial changes, and agree with its content.  Cherene Altes, MD [facility]

## 2013-08-05 NOTE — Progress Notes (Signed)
Peripherally Inserted Central Catheter/Midline Placement  The IV Nurse has discussed with the patient and/or persons authorized to consent for the patient, the purpose of this procedure and the potential benefits and risks involved with this procedure.  The benefits include less needle sticks, lab draws from the catheter and patient may be discharged home with the catheter.  Risks include, but not limited to, infection, bleeding, blood clot (thrombus formation), and puncture of an artery; nerve damage and irregular heat beat.  Alternatives to this procedure were also discussed.  PICC/Midline Placement Documentation  PICC / Midline Double Lumen 95/07/22 PICC Right Basilic 45 cm 0 cm (Active)  Indication for Insertion or Continuance of Line Administration of hyperosmolar/irritating solutions (i.e. TPN, Vancomycin, etc.) 08/05/2013  2:08 PM  Exposed Catheter (cm) 0 cm 08/05/2013  2:08 PM  Site Assessment Clean;Intact;Dry 08/05/2013  2:08 PM  Lumen #1 Status Flushed;Saline locked;Blood return noted 08/05/2013  2:08 PM  Lumen #2 Status Flushed;Saline locked;Blood return noted 08/05/2013  2:08 PM  Dressing Type Transparent 08/05/2013  2:08 PM  Dressing Status Clean;Dry;Antimicrobial disc in place;Intact 08/05/2013  2:08 PM  Line Care Connections checked and tightened 08/05/2013  2:08 PM  Dressing Intervention New dressing 08/05/2013  2:08 PM  Dressing Change Due 08/12/13 08/05/2013  2:08 PM       Rolena Infante 08/05/2013, 2:08 PM

## 2013-08-06 ENCOUNTER — Inpatient Hospital Stay (HOSPITAL_COMMUNITY): Payer: 59

## 2013-08-06 DIAGNOSIS — I10 Essential (primary) hypertension: Secondary | ICD-10-CM

## 2013-08-06 LAB — COMPREHENSIVE METABOLIC PANEL
ALT: 15 U/L (ref 0–53)
AST: 18 U/L (ref 0–37)
Albumin: 2.9 g/dL — ABNORMAL LOW (ref 3.5–5.2)
Alkaline Phosphatase: 93 U/L (ref 39–117)
BUN: 19 mg/dL (ref 6–23)
CO2: 22 mEq/L (ref 19–32)
Calcium: 8.5 mg/dL (ref 8.4–10.5)
Chloride: 103 mEq/L (ref 96–112)
Creatinine, Ser: 0.99 mg/dL (ref 0.50–1.35)
GFR calc Af Amer: >90 mL/min (ref >90)
GFR calc non Af Amer: 85 mL/min — ABNORMAL LOW (ref >90)
Glucose, Bld: 146 mg/dL — ABNORMAL HIGH (ref 70–99)
Potassium: 4 mEq/L (ref 3.7–5.3)
Sodium: 139 mEq/L (ref 137–147)
Total Bilirubin: 0.2 mg/dL — ABNORMAL LOW (ref 0.3–1.2)
Total Protein: 6.4 g/dL (ref 6.0–8.3)

## 2013-08-06 LAB — PREALBUMIN: Prealbumin: 20.6 mg/dL (ref 17.0–34.0)

## 2013-08-06 LAB — GLUCOSE, CAPILLARY
Glucose-Capillary: 162 mg/dL — ABNORMAL HIGH (ref 70–99)
Glucose-Capillary: 137 mg/dL — ABNORMAL HIGH (ref 70–99)
Glucose-Capillary: 124 mg/dL — ABNORMAL HIGH (ref 70–99)
Glucose-Capillary: 150 mg/dL — ABNORMAL HIGH (ref 70–99)
Glucose-Capillary: 169 mg/dL — ABNORMAL HIGH (ref 70–99)
Glucose-Capillary: 136 mg/dL — ABNORMAL HIGH (ref 70–99)

## 2013-08-06 LAB — PHOSPHORUS: Phosphorus: 2.3 mg/dL (ref 2.3–4.6)

## 2013-08-06 LAB — TRIGLYCERIDES: Triglycerides: 102 mg/dL (ref <150)

## 2013-08-06 LAB — MAGNESIUM: Magnesium: 2.1 mg/dL (ref 1.5–2.5)

## 2013-08-06 MED ORDER — FAT EMULSION 20 % IV EMUL
250.0000 mL | INTRAVENOUS | Status: AC
  Administered 2013-08-06: 250 mL via INTRAVENOUS
  Filled 2013-08-06: qty 250

## 2013-08-06 MED ORDER — TRACE MINERALS CR-CU-F-FE-I-MN-MO-SE-ZN IV SOLN
INTRAVENOUS | Status: AC
  Administered 2013-08-06: 17:00:00 via INTRAVENOUS
  Filled 2013-08-06: qty 2000

## 2013-08-06 MED ORDER — POTASSIUM CHLORIDE IN NACL 20-0.9 MEQ/L-% IV SOLN
INTRAVENOUS | Status: AC
  Administered 2013-08-06 – 2013-08-08 (×2): via INTRAVENOUS
  Filled 2013-08-06 (×3): qty 1000

## 2013-08-06 NOTE — Progress Notes (Signed)
Faint bowel sounds after ambulating X6 ,passing flatus

## 2013-08-06 NOTE — Progress Notes (Signed)
PARENTERAL NUTRITION CONSULT NOTE   Pharmacy Consult for TPN Indication: Crohn's ileitis /SBO (small bowel obstruction)  Allergies  Allergen Reactions  . Coconut Flavor Other (See Comments)    Does not eat because of crohn's     Patient Measurements: Height: 6' (182.9 cm) Weight: 230 lb (104.327 kg) IBW/kg (Calculated) : 77.6 Adjusted Body Weight:   Usual Weight:   Vital Signs: Temp: 97.2 F (36.2 C) (06/04 0503) Temp src: Oral (06/04 0503) BP: 133/86 mmHg (06/04 0503) Pulse Rate: 81 (06/04 0503) Intake/Output from previous day: 06/03 0701 - 06/04 0700 In: 1673.3 [P.O.:120; I.V.:1153.3; IV Piggyback:400] Out: -  Intake/Output from this shift:    Labs:  Recent Labs  08/04/13 0156 08/04/13 0550 08/05/13 0245  WBC 17.2* 16.5* 8.5  HGB 13.2 12.1* 10.1*  HCT 41.3 38.4* 33.4*  PLT 470* 444* 381     Recent Labs  08/04/13 0550 08/05/13 0245 08/06/13 0439 08/06/13 0450  NA 136* 138  --  139  K 5.1 4.6  --  4.0  CL 102 104  --  103  CO2 22 23  --  22  GLUCOSE 167* 178*  --  146*  BUN 19 21  --  19  CREATININE 1.25 1.07  --  0.99  CALCIUM 8.5 8.2*  --  8.5  MG  --   --   --  2.1  PHOS  --   --   --  2.3  PROT 7.1 6.2  --  6.4  ALBUMIN 3.3* 2.8*  --  2.9*  AST 26 19  --  18  ALT 25 17  --  15  ALKPHOS 100 82  --  93  BILITOT 0.3 0.3  --  0.2*  TRIG  --   --  102  --    Estimated Creatinine Clearance: 94.1 ml/min (by C-G formula based on Cr of 0.99).    Recent Labs  08/05/13 1958 08/06/13 0044 08/06/13 0515  GLUCAP 149* 162* 137*    Medical History: Past Medical History  Diagnosis Date  . Crohn's disease   . Hypertension   . Sleep apnea   . Prostate enlargement     Medications:  Prescriptions prior to admission  Medication Sig Dispense Refill  . aspirin 81 MG tablet Take 81 mg by mouth daily.        Marland Kitchen dutasteride (AVODART) 0.5 MG capsule Take 0.5 mg by mouth daily.        Marland Kitchen levothyroxine (SYNTHROID, LEVOTHROID) 175 MCG tablet Take 175  mcg by mouth daily.        Marland Kitchen lisinopril (PRINIVIL,ZESTRIL) 10 MG tablet Take 10 mg by mouth daily.      . mesalamine (PENTASA) 500 MG CR capsule Take 1,000 mg by mouth 4 (four) times daily.      . polyethylene glycol (MIRALAX / GLYCOLAX) packet Take 17 g by mouth daily.          Insulin Requirements in the past 24 hours:  8 units ssi  Current Nutrition:  Clinimix E 5/15 infusing at 40 ml/hr, 20% lipids at 33m/hr Goal rate 100 ml/hr and lipids will provide 120gm protein and 2184 kCal/day  Assessment:  63y/o M with h/o bowel obstruction a few weeks ago presented 6/1 with complaints of severe abdominal cramping, abdominal distention, and vomiting that started several hours prior to arrival. He has a h/o Crohn's disease, obesity, and multiple abdominal surgeries with adhesions and recurrent small bowel obstructions. Last admission April 2015 for pSBO.  Also began having diaphoresis, EKG w/ sinus tachy w/o significant TW changes. 6/2 CT: consistent with a Crohn's flare. Patient likely also has a chronic underlying stricture.  GI: Crohn's ileitis /SBO (small bowel obstruction). Admit albumin 3.8. Currently ungoing supportive care with IVF, NGT, start TPN). GI started pt on steroids for significant underlying fibrosis. High NG output  Endo: hypothyroid (TSH 2.2) on IV Synthroid. Glucose 137-162 on steroids. Lytes: K 4, mag 2.1, phos 2.3 Renal: CKD3, BPH, 0.99. Pulm: OSA Cards: HTN   Prn hydralazine    BP good Hepatobil: TG 102, tbili 0.2 Neuro:  ID:  LA normalized. WBC down to 8.5. Afeb on IV Cipro/Flagyl Best Practices: SQ heparin TPN Access: PICC 6/3 TPN day#:1   Nutritional Goals:  2100-2300 kCal,105-120 grams of protein per day  Plan:  Increase Clinimix E 5/15 to 79m/hr and cont Lipids 136mhr F/u prealb and am labs   LoExcell SeltzerPharmD Clinical Staff Pharmacist #[phone]/06/2013,7:45 AM

## 2013-08-06 NOTE — Progress Notes (Signed)
[facility] PROGRESS NOTE  Bruce [name] NWG:[id number] DOB: 1951-02-15 DOA: 08/03/2013 PCP: [name], MD  Brief narrative 63 y.o. year old male with prior hx/o crohns disease (s/p multiple bowel surgeries), HTN who presented with abdominal pain. Pt stated that he had severe sudden onset of abdominal pain, non bloody emesis and diarrhea while eating earlier today. Has also had severe generalized abd pain. Recently admission 1 month prior for high grade SBO. Surgery was consulted and an NGT was placed. Surgery recommended non operative management. Pt/wife felt that pain was significantly worse than previous admission, though pain much improved s/p NGT in ER. In the ER he was found ti have tachycardia with rates in the 120s. WBC 14.5. Hgb 12.4. Cr 1.34. KUB w/ high grade SBO. Lactate 2.3. Lipase WNL. Patient admitted to stepdown.  Assessment/Plan: Crohn's ileitis /Small bowel obstruction  -cont supportive care with IVFs , bowel rest with NGT and TNA -cont abx ( cipro and flagyl) -cont Budesonide  -GI suspects he has a combination of acute Crohn's exacerbation in setting of fibrostenosing disease that likely will not respond to med rxn alone and will need resection  -PICC placed and begin TNA . SBO improved on x ray this am.  Plan to clamp NG and remove per sx. -surgery recommend patient should recover from acute flare and discharge home with outpt surgery planned. -Dr. [name] is GI doctor in [place] suggests post op pt will require anti-TNF or immunomodulator therapy to reduce the chance of recurrence at the anastamosis.   Metabolic acidosis  -resolved   Acute respiratory failure with hypoxia/Sleep apnea  Resolved. -likely from mechanical issues from recent distended abdomen and SBO  -cont O2  -CPAP per respiratory. pt refused saying does not use at home and actually gave away his machine- likely can't achieve adequate seal with NGT in place   Dehydration/tachycardia  D/c  fluids  Hypertension  -BP stable -prn Hydralazine   Hypothyroidism  -continue IV   CKD (chronic kidney disease), stage III   DVT prophylaxis:  Subcutaneous heparin    Antibiotics:  Cipro 6/2 >>>  Flagyl 6/2 >>>      Code Status: full code Family Communication: wife at bedside Disposition Plan: home once acuter flair improves with outpt surgery   Consultants:  [facility]   Dr Benson Norway  Procedures:  none  Antibiotics:  cipr and flagyl since 6/2  HPI/Subjective: abdominal pain better.  Objective: Filed Vitals:   08/06/13 1343  BP: 146/82  Pulse: 94  Temp: 97.4 F (36.3 C)  Resp: 18    Intake/Output Summary (Last 24 hours) at 08/06/13 1546 Last data filed at 08/06/13 1344  Gross per 24 hour  Intake 1053.33 ml  Output      0 ml  Net 1053.33 ml   Filed Weights   08/04/13 0230 08/05/13 1128  Weight: 116.3 kg (256 lb 6.3 oz) 104.327 kg (230 lb)    Exam:   General:  Elderly male in NAD  HEENT: NG in place, moist mucosa  Chest: clear , no added sounds   CVS: N S1&S2, no MRG  Abd: soft, NT, ND, BS+  Ext: warm, no edema  CNS: AAOX3   Data Reviewed: Basic Metabolic Panel:  Recent Labs Lab 08/03/13 2258 08/04/13 0156 08/04/13 0550 08/05/13 0245 08/06/13 0450  NA 139  --  136* 138 139  K 4.3  --  5.1 4.6 4.0  CL 103  --  102 104 103  CO2 23  --  22 23  22  GLUCOSE 161*  --  167* 178* 146*  BUN 14  --  19 21 19   CREATININE 1.34 1.32 1.25 1.07 0.99  CALCIUM 9.4  --  8.5 8.2* 8.5  MG  --   --   --   --  2.1  PHOS  --   --   --   --  2.3   Liver Function Tests:  Recent Labs Lab 08/03/13 2258 08/04/13 0550 08/05/13 0245 08/06/13 0450  AST 24 26 19 18   ALT 22 25 17 15   ALKPHOS 112 100 82 93  BILITOT 0.4 0.3 0.3 0.2*  PROT 7.7 7.1 6.2 6.4  ALBUMIN 3.8 3.3* 2.8* 2.9*    Recent Labs Lab 08/03/13 2258  LIPASE 47   No results found for this basename: AMMONIA,  in the last 168 hours CBC:  Recent Labs Lab 08/03/13 2258  08/04/13 0156 08/04/13 0550 08/05/13 0245  WBC 14.5* 17.2* 16.5* 8.5  NEUTROABS 11.8*  --  14.6*  --   HGB 12.4* 13.2 12.1* 10.1*  HCT 38.7* 41.3 38.4* 33.4*  MCV 79.6 80.0 79.8 82.1  PLT 450* 470* 444* 381   Cardiac Enzymes:  Recent Labs Lab 08/04/13 0156 08/04/13 0550 08/04/13 1335  TROPONINI <0.30 <0.30 <0.30   BNP (last 3 results) No results found for this basename: PROBNP,  in the last 8760 hours CBG:  Recent Labs Lab 08/05/13 1958 08/06/13 0044 08/06/13 0515 08/06/13 0801 08/06/13 1200  GLUCAP 149* 162* 137* 124* 150*    Recent Results (from the past 240 hour(s))  MRSA PCR SCREENING     Status: None   Collection Time    08/04/13  2:26 AM      Result Value Ref Range Status   MRSA by PCR NEGATIVE  NEGATIVE Final   Comment:            The GeneXpert MRSA Assay (FDA     approved for NASAL specimens     only), is one component of a     comprehensive MRSA colonization     surveillance program. It is not     intended to diagnose MRSA     infection nor to guide or     monitor treatment for     MRSA infections.  URINE CULTURE     Status: None   Collection Time    08/04/13  6:31 PM      Result Value Ref Range Status   Specimen Description URINE, RANDOM   Final   Special Requests NONE   Final   Culture  Setup Time     Final   Value: 08/04/2013 19:34     Performed at [facility]     Final   Value: NO GROWTH     Performed at [facility]   Culture     Final   Value: NO GROWTH     Performed at [facility]   Report Status 08/05/2013 FINAL   Final     Studies: X-ray Chest Pa And Lateral   08/05/2013   CLINICAL DATA:  diaphoresis  EXAM: CHEST  2 VIEW  COMPARISON:  Abdominal series dated 08/03/2013  FINDINGS: The heart size and mediastinal contours are within normal limits. Stable mild discoid atelectasis versus scarring left lung base. Lungs otherwise clear. NG tube has been inserted tip not view of this study. No acute  osseous abnormalities.  IMPRESSION: No active cardiopulmonary disease.   Electronically Signed  By: Margaree Mackintosh M.D.   On: 08/05/2013 09:32   Dg Abd 2 Views  08/06/2013   CLINICAL DATA:  Evaluate for small bowel obstruction.  EXAM: ABDOMEN - 2 VIEW  COMPARISON:  08/04/2013 and 08/03/2013  FINDINGS: Nasogastric tube tip is near the duodenum bulb region. There is oral contrast in the left colon. Few gas-filled loops of bowel in the mid abdomen. Few air-fluid levels in the right upper abdomen. No evidence for free air.  IMPRESSION: The degree of small bowel obstruction has decreased since 08/03/2013. Oral contrast has advanced into the left colon. There continues to be a few gas-filled loops of small bowel and findings probably represent a partial small bowel obstruction.   Electronically Signed   By: Markus Daft M.D.   On: 08/06/2013 08:37    Scheduled Meds: . ciprofloxacin  400 mg Intravenous Q12H  . heparin  5,000 Units Subcutaneous 3 times per day  . insulin aspart  2-6 Units Subcutaneous 6 times per day  . levothyroxine  87.5 mcg Intravenous Daily  . methylPREDNISolone (SOLU-MEDROL) injection  60 mg Intravenous Q12H  . metronidazole  500 mg Intravenous 4 times per day  . sodium chloride  3 mL Intravenous Q12H   Continuous Infusions: . 0.9 % NaCl with KCl 20 mEq / L 60 mL/hr at 08/05/13 1818  . 0.9 % NaCl with KCl 20 mEq / L    . Marland KitchenTPN (CLINIMIX-E) Adult 40 mL/hr at 08/05/13 1823   And  . fat emulsion 250 mL (08/05/13 1823)  . Marland KitchenTPN (CLINIMIX-E) Adult     And  . fat emulsion        Time spent: 25 minutes    Alexys Gassett  [facility] Pager [phone] If 7PM-7AM, please contact night-coverage at [email or web address], password [email or web address] 08/06/2013, 3:46 PM  LOS: 3 days

## 2013-08-06 NOTE — Progress Notes (Signed)
Subjective: Minimal flatus in the very early morning today.  Objective: Vital signs in last 24 hours: Temp:  [97.2 F (36.2 C)-97.8 F (36.6 C)] 97.2 F (36.2 C) (06/04 0503) Pulse Rate:  [77-106] 81 (06/04 0503) Resp:  [9-18] 18 (06/04 0503) BP: (105-139)/(74-86) 133/86 mmHg (06/04 0503) SpO2:  [92 %-98 %] 96 % (06/04 0503) Weight:  [230 lb (104.327 kg)] 230 lb (104.327 kg) (06/03 1128) Last BM Date: 08/03/13 (per wife)  Intake/Output from previous day: 06/03 0701 - 06/04 0700 In: 1673.3 [P.O.:120; I.V.:1153.3; IV Piggyback:400] Out: -  Intake/Output this shift:    General appearance: alert and no distress GI: soft, NT, mild distension, tympanic  Lab Results:  Recent Labs  08/04/13 0156 08/04/13 0550 08/05/13 0245  WBC 17.2* 16.5* 8.5  HGB 13.2 12.1* 10.1*  HCT 41.3 38.4* 33.4*  PLT 470* 444* 381   BMET  Recent Labs  08/04/13 0550 08/05/13 0245 08/06/13 0450  NA 136* 138 139  K 5.1 4.6 4.0  CL 102 104 103  CO2 22 23 22   GLUCOSE 167* 178* 146*  BUN 19 21 19   CREATININE 1.25 1.07 0.99  CALCIUM 8.5 8.2* 8.5   LFT  Recent Labs  08/06/13 0450  PROT 6.4  ALBUMIN 2.9*  AST 18  ALT 15  ALKPHOS 93  BILITOT 0.2*   PT/INR No results found for this basename: LABPROT, INR,  in the last 72 hours Hepatitis Panel No results found for this basename: HEPBSAG, HCVAB, HEPAIGM, HEPBIGM,  in the last 72 hours C-Diff No results found for this basename: CDIFFTOX,  in the last 72 hours Fecal Lactopherrin No results found for this basename: FECLLACTOFRN,  in the last 72 hours  Studies/Results: X-ray Chest Pa And Lateral   08/05/2013   CLINICAL DATA:  diaphoresis  EXAM: CHEST  2 VIEW  COMPARISON:  Abdominal series dated 08/03/2013  FINDINGS: The heart size and mediastinal contours are within normal limits. Stable mild discoid atelectasis versus scarring left lung base. Lungs otherwise clear. NG tube has been inserted tip not view of this study. No acute osseous  abnormalities.  IMPRESSION: No active cardiopulmonary disease.   Electronically Signed   By: Margaree Mackintosh M.D.   On: 08/05/2013 09:32    Medications:  Scheduled: . ciprofloxacin  400 mg Intravenous Q12H  . heparin  5,000 Units Subcutaneous 3 times per day  . insulin aspart  2-6 Units Subcutaneous 6 times per day  . levothyroxine  87.5 mcg Intravenous Daily  . methylPREDNISolone (SOLU-MEDROL) injection  60 mg Intravenous Q12H  . metronidazole  500 mg Intravenous 4 times per day  . sodium chloride  3 mL Intravenous Q12H   Continuous: . 0.9 % NaCl with KCl 20 mEq / L 60 mL/hr at 08/05/13 1818  . Marland KitchenTPN (CLINIMIX-E) Adult 40 mL/hr at 08/05/13 1823   And  . fat emulsion 250 mL (08/05/13 1823)    Assessment/Plan: 1) Crohn's stricture. 2) SBO.   Clinically no significant improvement.  He is without pain.  I will check a KUB this AM.  Plan: 1) KUB. 2) Continue with steroids. 3) Continue with TPN. 4) Resection per Surgery.   LOS: 3 days   Beryle Beams 08/06/2013, 7:27 AM

## 2013-08-06 NOTE — Progress Notes (Signed)
I have seen and examined the patient and agree with the assessment and plans. He will eventually need an ileocecetomy but would like to get him over the acute Crohn's flair.  Serigne Kubicek A. Ninfa Linden  MD, FACS

## 2013-08-06 NOTE — Progress Notes (Signed)
Patient ID: Bruce [name], male   DOB: 02-06-1951, 63 y.o.   MRN: [id number]    Subjective: Pt feels ok.  No pain.  Passed a small amount of flatus yesterday.  Objective: Vital signs in last 24 hours: Temp:  [97.2 F (36.2 C)-97.8 F (36.6 C)] 97.2 F (36.2 C) (06/04 0503) Pulse Rate:  [77-106] 81 (06/04 0503) Resp:  [9-18] 18 (06/04 0503) BP: (109-139)/(79-86) 133/86 mmHg (06/04 0503) SpO2:  [92 %-97 %] 96 % (06/04 0503) Weight:  [230 lb (104.327 kg)] 230 lb (104.327 kg) (06/03 1128) Last BM Date: 08/03/13 (per wife)  Intake/Output from previous day: 06/03 0701 - 06/04 0700 In: 1673.3 [P.O.:120; I.V.:1153.3; IV Piggyback:400] Out: -  Intake/Output this shift:    PE: Abd: soft, mild distention, +BS, NGt in place with bilious output (800cc on 6/2, no output recorded yesterday upon transfer), NT  Lab Results:   Recent Labs  08/04/13 0550 08/05/13 0245  WBC 16.5* 8.5  HGB 12.1* 10.1*  HCT 38.4* 33.4*  PLT 444* 381   BMET  Recent Labs  08/05/13 0245 08/06/13 0450  NA 138 139  K 4.6 4.0  CL 104 103  CO2 23 22  GLUCOSE 178* 146*  BUN 21 19  CREATININE 1.07 0.99  CALCIUM 8.2* 8.5   PT/INR No results found for this basename: LABPROT, INR,  in the last 72 hours CMP     Component Value Date/Time   NA 139 08/06/2013 0450   K 4.0 08/06/2013 0450   CL 103 08/06/2013 0450   CO2 22 08/06/2013 0450   GLUCOSE 146* 08/06/2013 0450   BUN 19 08/06/2013 0450   CREATININE 0.99 08/06/2013 0450   CREATININE 1.32 04/14/2013 1437   CALCIUM 8.5 08/06/2013 0450   PROT 6.4 08/06/2013 0450   ALBUMIN 2.9* 08/06/2013 0450   AST 18 08/06/2013 0450   ALT 15 08/06/2013 0450   ALKPHOS 93 08/06/2013 0450   BILITOT 0.2* 08/06/2013 0450   GFRNONAA 85* 08/06/2013 0450   GFRAA >90 08/06/2013 0450   Lipase     Component Value Date/Time   LIPASE 47 08/03/2013 2258       Studies/Results: X-ray Chest Pa And Lateral   08/05/2013   CLINICAL DATA:  diaphoresis  EXAM: CHEST  2 VIEW  COMPARISON:  Abdominal series  dated 08/03/2013  FINDINGS: The heart size and mediastinal contours are within normal limits. Stable mild discoid atelectasis versus scarring left lung base. Lungs otherwise clear. NG tube has been inserted tip not view of this study. No acute osseous abnormalities.  IMPRESSION: No active cardiopulmonary disease.   Electronically Signed   By: Margaree Mackintosh M.D.   On: 08/05/2013 09:32    Anti-infectives: Anti-infectives   Start     Dose/Rate Route Frequency Ordered Stop   08/04/13 1315  ciprofloxacin (CIPRO) IVPB 400 mg     400 mg 200 mL/hr over 60 Minutes Intravenous Every 12 hours 08/04/13 1312     08/04/13 1300  metroNIDAZOLE (FLAGYL) IVPB 500 mg     500 mg 100 mL/hr over 60 Minutes Intravenous 4 times per day 08/04/13 1213         Assessment/Plan  1. SBO secondary to crohn's flare  Plan: 1. Will repeat abdominal films today.  Pending his progress over the next 24 hours or so, he may require an operation if he does not open up on steroids.  We will closely follow with you.   2. Cont NPO and NGT management for now.  Films  pending   LOS: 3 days    Henreitta Cea 08/06/2013, 8:18 AM Pager: [phone]

## 2013-08-07 ENCOUNTER — Inpatient Hospital Stay (HOSPITAL_COMMUNITY): Payer: 59

## 2013-08-07 LAB — BASIC METABOLIC PANEL
BUN: 21 mg/dL (ref 6–23)
CO2: 24 mEq/L (ref 19–32)
Calcium: 8.8 mg/dL (ref 8.4–10.5)
Chloride: 104 mEq/L (ref 96–112)
Creatinine, Ser: 1.02 mg/dL (ref 0.50–1.35)
GFR calc Af Amer: 88 mL/min — ABNORMAL LOW (ref >90)
GFR calc non Af Amer: 76 mL/min — ABNORMAL LOW (ref >90)
Glucose, Bld: 151 mg/dL — ABNORMAL HIGH (ref 70–99)
Potassium: 4 mEq/L (ref 3.7–5.3)
Sodium: 140 mEq/L (ref 137–147)

## 2013-08-07 LAB — PROTIME-INR
INR: 1.05 (ref 0.00–1.49)
Prothrombin Time: 13.5 seconds (ref 11.6–15.2)

## 2013-08-07 LAB — GLUCOSE, CAPILLARY
Glucose-Capillary: 150 mg/dL — ABNORMAL HIGH (ref 70–99)
Glucose-Capillary: 154 mg/dL — ABNORMAL HIGH (ref 70–99)
Glucose-Capillary: 156 mg/dL — ABNORMAL HIGH (ref 70–99)
Glucose-Capillary: 163 mg/dL — ABNORMAL HIGH (ref 70–99)
Glucose-Capillary: 175 mg/dL — ABNORMAL HIGH (ref 70–99)
Glucose-Capillary: 200 mg/dL — ABNORMAL HIGH (ref 70–99)

## 2013-08-07 LAB — MAGNESIUM: Magnesium: 2.3 mg/dL (ref 1.5–2.5)

## 2013-08-07 LAB — PHOSPHORUS: Phosphorus: 3 mg/dL (ref 2.3–4.6)

## 2013-08-07 LAB — C-REACTIVE PROTEIN: CRP: <0.5 mg/dL — ABNORMAL LOW (ref <0.60)

## 2013-08-07 MED ORDER — TRACE MINERALS CR-CU-F-FE-I-MN-MO-SE-ZN IV SOLN
INTRAVENOUS | Status: DC
  Filled 2013-08-07: qty 2000

## 2013-08-07 MED ORDER — TRACE MINERALS CR-CU-F-FE-I-MN-MO-SE-ZN IV SOLN
INTRAVENOUS | Status: AC
  Administered 2013-08-07: 17:00:00 via INTRAVENOUS
  Filled 2013-08-07: qty 2000

## 2013-08-07 MED ORDER — FAT EMULSION 20 % IV EMUL
250.0000 mL | INTRAVENOUS | Status: AC
  Administered 2013-08-07: 250 mL via INTRAVENOUS
  Filled 2013-08-07: qty 250

## 2013-08-07 MED ORDER — FAT EMULSION 20 % IV EMUL
250.0000 mL | INTRAVENOUS | Status: DC
  Filled 2013-08-07: qty 250

## 2013-08-07 MED ORDER — INSULIN ASPART 100 UNIT/ML ~~LOC~~ SOLN
0.0000 [IU] | Freq: Four times a day (QID) | SUBCUTANEOUS | Status: DC
  Administered 2013-08-07 – 2013-08-08 (×4): 3 [IU] via SUBCUTANEOUS
  Administered 2013-08-08: 2 [IU] via SUBCUTANEOUS
  Administered 2013-08-09: 5 [IU] via SUBCUTANEOUS
  Administered 2013-08-09: 3 [IU] via SUBCUTANEOUS
  Administered 2013-08-09 – 2013-08-10 (×4): 5 [IU] via SUBCUTANEOUS
  Administered 2013-08-10: 3 [IU] via SUBCUTANEOUS
  Administered 2013-08-10 – 2013-08-11 (×4): 5 [IU] via SUBCUTANEOUS
  Administered 2013-08-11 – 2013-08-12 (×2): 3 [IU] via SUBCUTANEOUS
  Administered 2013-08-12: 5 [IU] via SUBCUTANEOUS
  Administered 2013-08-12 (×2): 3 [IU] via SUBCUTANEOUS
  Administered 2013-08-13: 2 [IU] via SUBCUTANEOUS
  Administered 2013-08-13 (×2): 3 [IU] via SUBCUTANEOUS
  Administered 2013-08-14 (×2): 5 [IU] via SUBCUTANEOUS
  Administered 2013-08-14: 2 [IU] via SUBCUTANEOUS
  Administered 2013-08-14: 3 [IU] via SUBCUTANEOUS
  Administered 2013-08-15: 2 [IU] via SUBCUTANEOUS
  Administered 2013-08-15: 3 [IU] via SUBCUTANEOUS
  Administered 2013-08-15: 5 [IU] via SUBCUTANEOUS
  Administered 2013-08-16 (×4): 2 [IU] via SUBCUTANEOUS
  Administered 2013-08-17: 3 [IU] via SUBCUTANEOUS

## 2013-08-07 MED ORDER — OCTREOTIDE ACETATE 500 MCG/ML IJ SOLN
50.0000 ug/h | INTRAMUSCULAR | Status: DC
  Administered 2013-08-07 – 2013-08-09 (×3): 50 ug/h via INTRAVENOUS
  Filled 2013-08-07 (×7): qty 1

## 2013-08-07 NOTE — Progress Notes (Signed)
PARENTERAL NUTRITION CONSULT NOTE - FOLLOW UP  Pharmacy Consult:  TPN Indication: Crohn's ileitis / SBO  Allergies  Allergen Reactions  . Coconut Flavor Other (See Comments)    Does not eat because of crohn's     Patient Measurements: Height: 6' (182.9 cm) Weight: 249 lb 12.5 oz (113.3 kg) IBW/kg (Calculated) : 77.6  Vital Signs: Temp: 97.4 F (36.3 C) (06/05 0447) Temp src: Oral (06/05 0447) BP: 133/78 mmHg (06/05 0447) Pulse Rate: 84 (06/05 0447) Intake/Output from previous day: 06/04 0701 - 06/05 0700 In: 1320 [I.V.:480; TPN:840] Out: 1500 [Urine:750; Emesis/NG output:750]  Labs:  Recent Labs  08/05/13 0245  WBC 8.5  HGB 10.1*  HCT 33.4*  PLT 381     Recent Labs  08/05/13 0245 08/06/13 0439 08/06/13 0450 08/07/13 0522  NA 138  --  139 140  K 4.6  --  4.0 4.0  CL 104  --  103 104  CO2 23  --  22 24  GLUCOSE 178*  --  146* 151*  BUN 21  --  19 21  CREATININE 1.07  --  0.99 1.02  CALCIUM 8.2*  --  8.5 8.8  MG  --   --  2.1 2.3  PHOS  --   --  2.3 3.0  PROT 6.2  --  6.4  --   ALBUMIN 2.8*  --  2.9*  --   AST 19  --  18  --   ALT 17  --  15  --   ALKPHOS 82  --  93  --   BILITOT 0.3  --  0.2*  --   PREALBUMIN  --   --  20.6  --   TRIG  --  102  --   --    Estimated Creatinine Clearance: 95.1 ml/min (by C-G formula based on Cr of 1.02).    Recent Labs  08/07/13 0002 08/07/13 0432 08/07/13 0803  GLUCAP 163* 154* 150*      Insulin Requirements in the past 24 hours:  16 units SSI   Assessment:  71 YOM with history of bowel obstruction a few weeks ago presented on 08/03/13 with complaints of severe abdominal cramping, abdominal distention, and vomiting that started several hours prior to arrival. He has a history of Crohn's disease, obesity, and multiple abdominal surgeries with adhesions and recurrent small bowel obstructions. Last admission was in April of 2015 for pSBO.  Pharmacy managing TPN for nutritional support.  GI: Crohn's ileitis /  SBO / unspecified constipation / obesity.  GI started steroids for significant underlying fibrosis.  6/2 CT: consistent with Crohn's flare, likely has a chronic underlying stricture.  Prealbumin WNL at 20.6.  Clamping NGT and may d/c today, SBO improving - no plan for surgery currently Endo: hypothyroid on IV Synthroid, TSH WNL.  No hx DM - CBGs acceptable on SSI, significant SSI use Lytes: all WNL Renal: CKD3 / BPH - SCr 1.02 - low UOP, NS with 52mq KCL at 40 ml/hr Pulm: OSA - stable on RA Cards: HTN - VSS, PRN hydralazine Hepatobil: LFTs / tbili / TG WNL ID: Cipro/Flagyl D#4 (6/2 >> ) for GI coverage.  Afebrile, WBC WNL.  UCx negative. Best Practices: SQ heparin TPN Access: PICC placed 6/3 TPN day#: 2 (6/3 >> )  Current Nutrition:  TPN  Nutritional Goals:  2100-2300 kCal,105-120 grams of protein per day   Plan:  - Increase Clinimix E 5/15 to 83 ml/hr and continue IVFE at 10 ml/hr.  TPN to provide 1894 kCal and 100 gm of protein daily.  F/U CBGs and advance to goal rate as appropriate. - Daily multivitamin and trace elements - Change SSI to moderate Q6H (currently on ICU scale) + add 10 units regular insulin to TPN - F/U daily     Navid Lenzen D. Mina Marble, PharmD, BCPS Pager:  [phone] 08/07/2013, 8:56 AM

## 2013-08-07 NOTE — Progress Notes (Signed)
Patient ID: Bruce [name], male   DOB: 04/28/1950, 63 y.o.   MRN: [id number]    Subjective: Pt feels ok today.  Pain minimal.  Passing some flatus  Objective: Vital signs in last 24 hours: Temp:  [97.4 F (36.3 C)-97.6 F (36.4 C)] 97.4 F (36.3 C) (06/05 0447) Pulse Rate:  [84-94] 84 (06/05 0447) Resp:  [16-18] 17 (06/05 0447) BP: (133-148)/(78-88) 133/78 mmHg (06/05 0447) SpO2:  [95 %-96 %] 95 % (06/05 0447) Weight:  [249 lb 12.5 oz (113.3 kg)] 249 lb 12.5 oz (113.3 kg) (06/05 0500) Last BM Date: 08/06/13  Intake/Output from previous day: 06/04 0701 - 06/05 0700 In: 1320 [I.V.:480; TPN:840] Out: 1500 [Urine:750; Emesis/NG output:750] Intake/Output this shift:    PE: Abd: soft, minimally tender in RLQ, +BS, ND, NGT with watered down output  Lab Results:   Recent Labs  08/05/13 0245  WBC 8.5  HGB 10.1*  HCT 33.4*  PLT 381   BMET  Recent Labs  08/06/13 0450 08/07/13 0522  NA 139 140  K 4.0 4.0  CL 103 104  CO2 22 24  GLUCOSE 146* 151*  BUN 19 21  CREATININE 0.99 1.02  CALCIUM 8.5 8.8   PT/INR No results found for this basename: LABPROT, INR,  in the last 72 hours CMP     Component Value Date/Time   NA 140 08/07/2013 0522   K 4.0 08/07/2013 0522   CL 104 08/07/2013 0522   CO2 24 08/07/2013 0522   GLUCOSE 151* 08/07/2013 0522   BUN 21 08/07/2013 0522   CREATININE 1.02 08/07/2013 0522   CREATININE 1.32 04/14/2013 1437   CALCIUM 8.8 08/07/2013 0522   PROT 6.4 08/06/2013 0450   ALBUMIN 2.9* 08/06/2013 0450   AST 18 08/06/2013 0450   ALT 15 08/06/2013 0450   ALKPHOS 93 08/06/2013 0450   BILITOT 0.2* 08/06/2013 0450   GFRNONAA 76* 08/07/2013 0522   GFRAA 88* 08/07/2013 0522   Lipase     Component Value Date/Time   LIPASE 47 08/03/2013 2258       Studies/Results: X-ray Chest Pa And Lateral   08/05/2013   CLINICAL DATA:  diaphoresis  EXAM: CHEST  2 VIEW  COMPARISON:  Abdominal series dated 08/03/2013  FINDINGS: The heart size and mediastinal contours are within normal limits.  Stable mild discoid atelectasis versus scarring left lung base. Lungs otherwise clear. NG tube has been inserted tip not view of this study. No acute osseous abnormalities.  IMPRESSION: No active cardiopulmonary disease.   Electronically Signed   By: Margaree Mackintosh M.D.   On: 08/05/2013 09:32   Dg Abd 2 Views  08/07/2013   CLINICAL DATA:  Follow up partial small bowel obstruction.  EXAM: ABDOMEN - 2 VIEW  COMPARISON:  08/06/2013  FINDINGS: Enteric tube remains in place with tip in the expected region of the distal stomach or duodenal bulb. Right upper quadrant surgical clips are again seen. Residual oral contrast material in the left colon does not appear significantly changed. A few small air-fluid levels are again seen in the right mid to upper abdomen, slightly less prominent than on the prior study. There is otherwise a relative paucity of small bowel gas. No intraperitoneal free air is identified. Mild lumbar dextroscoliosis is again seen. Mild elevation the right hemidiaphragm does not appear significantly changed.  IMPRESSION: A few small air-fluid levels in the right abdomen, slightly less prominent than on the prior study and suggestive of improving partial small bowel obstruction.   Electronically Signed  By: Logan Bores   On: 08/07/2013 08:34   Dg Abd 2 Views  08/06/2013   CLINICAL DATA:  Evaluate for small bowel obstruction.  EXAM: ABDOMEN - 2 VIEW  COMPARISON:  08/04/2013 and 08/03/2013  FINDINGS: Nasogastric tube tip is near the duodenum bulb region. There is oral contrast in the left colon. Few gas-filled loops of bowel in the mid abdomen. Few air-fluid levels in the right upper abdomen. No evidence for free air.  IMPRESSION: The degree of small bowel obstruction has decreased since 08/03/2013. Oral contrast has advanced into the left colon. There continues to be a few gas-filled loops of small bowel and findings probably represent a partial small bowel obstruction.   Electronically Signed   By:  Markus Daft M.D.   On: 08/06/2013 08:37    Anti-infectives: Anti-infectives   Start     Dose/Rate Route Frequency Ordered Stop   08/04/13 1315  ciprofloxacin (CIPRO) IVPB 400 mg     400 mg 200 mL/hr over 60 Minutes Intravenous Every 12 hours 08/04/13 1312     08/04/13 1300  metroNIDAZOLE (FLAGYL) IVPB 500 mg     500 mg 100 mL/hr over 60 Minutes Intravenous 4 times per day 08/04/13 1213         Assessment/Plan  1. Crohn's disease with resultant PSBO, improving  Plan: 1. Films continue to show improvement, but maybe slight residual of air-fluid level in small bowel.  He has contrast in his entire colon today.  Will clamp his NGT and if tolerates this will dc it later today.  No immediate need for surgery today.   LOS: 4 days    Henreitta Cea 08/07/2013, 8:54 AM Pager: [phone]

## 2013-08-07 NOTE — Progress Notes (Signed)
[facility] PROGRESS NOTE  Bruce [name] DJS:[id number] DOB: Sep 01, 1950 DOA: 08/03/2013 PCP: [name], MD  Brief narrative 63 y.o. year old male with prior hx/o crohns disease (s/p multiple bowel surgeries), HTN who presented with abdominal pain. Pt stated that he had severe sudden onset of abdominal pain, non bloody emesis and diarrhea while eating earlier today. Has also had severe generalized abd pain. Recently admission 1 month prior for high grade SBO. Surgery was consulted and an NGT was placed. Surgery recommended non operative management. Pt/wife felt that pain was significantly worse than previous admission, though pain much improved s/p NGT in ER. In the ER he was found ti have tachycardia with rates in the 120s. WBC 14.5. Hgb 12.4. Cr 1.34. KUB w/ high grade SBO. Lactate 2.3. Lipase WNL. Patient admitted to stepdown.  Assessment/Plan: Crohn's ileitis /Small bowel obstruction  -cont supportive care with IVFs , bowel rest with NGT and TNA -cont abx ( cipro and flagyl) -cont Budesonide  -GI suspects he has a combination of acute Crohn's exacerbation in setting of fibrostenosing disease that likely will not respond to med rxn alone and will need resection  -PICC placed and begin TNA . SBO improved on x ray this am.  Plan to clamp NG and remove per sx. -surgery recommend patient should recover from acute flare and discharge home with outpt surgery planned. -NG clamped today as a repeat abdominal x-ray showing improvement in small bowel obstruction. -Dr. [name] is his GI  in [place]. Dr.Hung suggests post op pt will require anti-TNF or immunomodulator therapy to reduce the chance of recurrence at the anastamosis.   Metabolic acidosis  -resolved   Acute respiratory failure with hypoxia/Sleep apnea  Resolved. -likely from mechanical issues from recent distended abdomen and SBO  -cont O2  -CPAP per respiratory. pt refused saying does not use at home and actually gave away his  machine.  Dehydration/tachycardia  D/c fluids  Hypertension  -BP stable -prn Hydralazine   Hypothyroidism  -continue IV   CKD (chronic kidney disease), stage III   DVT prophylaxis:  Subcutaneous heparin    Antibiotics:  Cipro 6/2 >>>  Flagyl 6/2 >>>      Code Status: full code Family Communication: wife at bedside Disposition Plan: home once acute flair improves with outpt surgery   Consultants:  [facility]   Dr Benson Norway  Procedures:  none  Antibiotics:  cipro and flagyl since 6/2  HPI/Subjective: Patient feels better. No abdominal pain  Objective: Filed Vitals:   08/07/13 0447  BP: 133/78  Pulse: 84  Temp: 97.4 F (36.3 C)  Resp: 17    Intake/Output Summary (Last 24 hours) at 08/07/13 1349 Last data filed at 08/07/13 0600  Gross per 24 hour  Intake   1320 ml  Output   1100 ml  Net    220 ml   Filed Weights   08/04/13 0230 08/05/13 1128 08/07/13 0500  Weight: 116.3 kg (256 lb 6.3 oz) 104.327 kg (230 lb) 113.3 kg (249 lb 12.5 oz)    Exam:   General:  Elderly male in NAD  HEENT: NG in place, moist mucosa  Chest: clear , no added sounds   CVS: N S1&S2, no MRG  Abd: soft, NT, ND, BS+  Ext: warm, no edema  CNS: AAOX3   Data Reviewed: Basic Metabolic Panel:  Recent Labs Lab 08/03/13 2258 08/04/13 0156 08/04/13 0550 08/05/13 0245 08/06/13 0450 08/07/13 0522  NA 139  --  136* 138 139 140  K 4.3  --  5.1 4.6 4.0 4.0  CL 103  --  102 104 103 104  CO2 23  --  22 23 22 24   GLUCOSE 161*  --  167* 178* 146* 151*  BUN 14  --  19 21 19 21   CREATININE 1.34 1.32 1.25 1.07 0.99 1.02  CALCIUM 9.4  --  8.5 8.2* 8.5 8.8  MG  --   --   --   --  2.1 2.3  PHOS  --   --   --   --  2.3 3.0   Liver Function Tests:  Recent Labs Lab 08/03/13 2258 08/04/13 0550 08/05/13 0245 08/06/13 0450  AST 24 26 19 18   ALT 22 25 17 15   ALKPHOS 112 100 82 93  BILITOT 0.4 0.3 0.3 0.2*  PROT 7.7 7.1 6.2 6.4  ALBUMIN 3.8 3.3* 2.8* 2.9*    Recent  Labs Lab 08/03/13 2258  LIPASE 47   No results found for this basename: AMMONIA,  in the last 168 hours CBC:  Recent Labs Lab 08/03/13 2258 08/04/13 0156 08/04/13 0550 08/05/13 0245  WBC 14.5* 17.2* 16.5* 8.5  NEUTROABS 11.8*  --  14.6*  --   HGB 12.4* 13.2 12.1* 10.1*  HCT 38.7* 41.3 38.4* 33.4*  MCV 79.6 80.0 79.8 82.1  PLT 450* 470* 444* 381   Cardiac Enzymes:  Recent Labs Lab 08/04/13 0156 08/04/13 0550 08/04/13 1335  TROPONINI <0.30 <0.30 <0.30   BNP (last 3 results) No results found for this basename: PROBNP,  in the last 8760 hours CBG:  Recent Labs Lab 08/06/13 1948 08/07/13 0002 08/07/13 0432 08/07/13 0803 08/07/13 1220  GLUCAP 136* 163* 154* 150* 156*    Recent Results (from the past 240 hour(s))  MRSA PCR SCREENING     Status: None   Collection Time    08/04/13  2:26 AM      Result Value Ref Range Status   MRSA by PCR NEGATIVE  NEGATIVE Final   Comment:            The GeneXpert MRSA Assay (FDA     approved for NASAL specimens     only), is one component of a     comprehensive MRSA colonization     surveillance program. It is not     intended to diagnose MRSA     infection nor to guide or     monitor treatment for     MRSA infections.  URINE CULTURE     Status: None   Collection Time    08/04/13  6:31 PM      Result Value Ref Range Status   Specimen Description URINE, RANDOM   Final   Special Requests NONE   Final   Culture  Setup Time     Final   Value: 08/04/2013 19:34     Performed at [facility]     Final   Value: NO GROWTH     Performed at [facility]   Culture     Final   Value: NO GROWTH     Performed at [facility]   Report Status 08/05/2013 FINAL   Final     Studies: Dg Abd 2 Views  08/07/2013   CLINICAL DATA:  Follow up partial small bowel obstruction.  EXAM: ABDOMEN - 2 VIEW  COMPARISON:  08/06/2013  FINDINGS: Enteric tube remains in place with tip in the expected region of  the distal stomach or duodenal bulb. Right  upper quadrant surgical clips are again seen. Residual oral contrast material in the left colon does not appear significantly changed. A few small air-fluid levels are again seen in the right mid to upper abdomen, slightly less prominent than on the prior study. There is otherwise a relative paucity of small bowel gas. No intraperitoneal free air is identified. Mild lumbar dextroscoliosis is again seen. Mild elevation the right hemidiaphragm does not appear significantly changed.  IMPRESSION: A few small air-fluid levels in the right abdomen, slightly less prominent than on the prior study and suggestive of improving partial small bowel obstruction.   Electronically Signed   By: Logan Bores   On: 08/07/2013 08:34   Dg Abd 2 Views  08/06/2013   CLINICAL DATA:  Evaluate for small bowel obstruction.  EXAM: ABDOMEN - 2 VIEW  COMPARISON:  08/04/2013 and 08/03/2013  FINDINGS: Nasogastric tube tip is near the duodenum bulb region. There is oral contrast in the left colon. Few gas-filled loops of bowel in the mid abdomen. Few air-fluid levels in the right upper abdomen. No evidence for free air.  IMPRESSION: The degree of small bowel obstruction has decreased since 08/03/2013. Oral contrast has advanced into the left colon. There continues to be a few gas-filled loops of small bowel and findings probably represent a partial small bowel obstruction.   Electronically Signed   By: Markus Daft M.D.   On: 08/06/2013 08:37    Scheduled Meds: . ciprofloxacin  400 mg Intravenous Q12H  . heparin  5,000 Units Subcutaneous 3 times per day  . insulin aspart  0-15 Units Subcutaneous 4 times per day  . levothyroxine  87.5 mcg Intravenous Daily  . methylPREDNISolone (SOLU-MEDROL) injection  60 mg Intravenous Q12H  . metronidazole  500 mg Intravenous 4 times per day  . sodium chloride  3 mL Intravenous Q12H   Continuous Infusions: . 0.9 % NaCl with KCl 20 mEq / L 40 mL/hr at  08/06/13 1831  . Marland KitchenTPN (CLINIMIX-E) Adult 60 mL/hr at 08/07/13 0600   And  . fat emulsion 250 mL (08/07/13 0600)  . Marland KitchenTPN (CLINIMIX-E) Adult     And  . fat emulsion        Time spent: 25 minutes    Bruce [name]  [facility] Pager [phone] If 7PM-7AM, please contact night-coverage at [email or web address], password [facility] 08/07/2013, 1:49 PM  LOS: 4 days

## 2013-08-07 NOTE — Progress Notes (Signed)
I have seen and examined the patient and agree with the assessment and plans. Clamp NG today.  Darah Simkin A. Ninfa Linden  MD, FACS

## 2013-08-07 NOTE — Progress Notes (Signed)
Subjective: Feeling well.  Small diarrheal bowel movement yesterday.  Objective: Vital signs in last 24 hours: Temp:  [97.4 F (36.3 C)-97.6 F (36.4 C)] 97.4 F (36.3 C) (06/05 0447) Pulse Rate:  [84-94] 84 (06/05 0447) Resp:  [16-18] 17 (06/05 0447) BP: (133-148)/(78-88) 133/78 mmHg (06/05 0447) SpO2:  [95 %-96 %] 95 % (06/05 0447) Weight:  [249 lb 12.5 oz (113.3 kg)] 249 lb 12.5 oz (113.3 kg) (06/05 0500) Last BM Date: 08/06/13  Intake/Output from previous day: 06/04 0701 - 06/05 0700 In: 1320 [I.V.:480; TPN:840] Out: 1500 [Urine:750; Emesis/NG output:750] Intake/Output this shift:    General appearance: alert and no distress GI: soft, obese, decreased tympany  Lab Results:  Recent Labs  08/05/13 0245  WBC 8.5  HGB 10.1*  HCT 33.4*  PLT 381   BMET  Recent Labs  08/05/13 0245 08/06/13 0450 08/07/13 0522  NA 138 139 140  K 4.6 4.0 4.0  CL 104 103 104  CO2 23 22 24   GLUCOSE 178* 146* 151*  BUN 21 19 21   CREATININE 1.07 0.99 1.02  CALCIUM 8.2* 8.5 8.8   LFT  Recent Labs  08/06/13 0450  PROT 6.4  ALBUMIN 2.9*  AST 18  ALT 15  ALKPHOS 93  BILITOT 0.2*   PT/INR No results found for this basename: LABPROT, INR,  in the last 72 hours Hepatitis Panel No results found for this basename: HEPBSAG, HCVAB, HEPAIGM, HEPBIGM,  in the last 72 hours C-Diff No results found for this basename: CDIFFTOX,  in the last 72 hours Fecal Lactopherrin No results found for this basename: FECLLACTOFRN,  in the last 72 hours  Studies/Results: X-ray Chest Pa And Lateral   08/05/2013   CLINICAL DATA:  diaphoresis  EXAM: CHEST  2 VIEW  COMPARISON:  Abdominal series dated 08/03/2013  FINDINGS: The heart size and mediastinal contours are within normal limits. Stable mild discoid atelectasis versus scarring left lung base. Lungs otherwise clear. NG tube has been inserted tip not view of this study. No acute osseous abnormalities.  IMPRESSION: No active cardiopulmonary disease.    Electronically Signed   By: Margaree Mackintosh M.D.   On: 08/05/2013 09:32   Dg Abd 2 Views  08/06/2013   CLINICAL DATA:  Evaluate for small bowel obstruction.  EXAM: ABDOMEN - 2 VIEW  COMPARISON:  08/04/2013 and 08/03/2013  FINDINGS: Nasogastric tube tip is near the duodenum bulb region. There is oral contrast in the left colon. Few gas-filled loops of bowel in the mid abdomen. Few air-fluid levels in the right upper abdomen. No evidence for free air.  IMPRESSION: The degree of small bowel obstruction has decreased since 08/03/2013. Oral contrast has advanced into the left colon. There continues to be a few gas-filled loops of small bowel and findings probably represent a partial small bowel obstruction.   Electronically Signed   By: Markus Daft M.D.   On: 08/06/2013 08:37    Medications:  Scheduled: . ciprofloxacin  400 mg Intravenous Q12H  . heparin  5,000 Units Subcutaneous 3 times per day  . insulin aspart  2-6 Units Subcutaneous 6 times per day  . levothyroxine  87.5 mcg Intravenous Daily  . methylPREDNISolone (SOLU-MEDROL) injection  60 mg Intravenous Q12H  . metronidazole  500 mg Intravenous 4 times per day  . sodium chloride  3 mL Intravenous Q12H   Continuous: . 0.9 % NaCl with KCl 20 mEq / L 40 mL/hr at 08/06/13 1831  . Marland KitchenTPN (CLINIMIX-E) Adult 60 mL/hr at 08/07/13  0600   And  . fat emulsion 250 mL (08/07/13 0600)    Assessment/Plan: 1) Crohn's disease. 2) SBO - improving.   The SBO is improving as he is less tympanic.  The KUB from yesterday also reveals an improvement in his obstruction.  Contrast was able to move through.  I will check a CRP level determine if his inflammation is improving, but this is not a 100% definitive test.  This will help with assessment of his inflammation in anticipation for surgery.  Plan: 1) Continue NG tube. 2) Continue TPN. 3) Check CRP. 4) Continue with steroids.   LOS: 4 days   Beryle Beams 08/07/2013, 7:07 AM

## 2013-08-08 DIAGNOSIS — K5000 Crohn's disease of small intestine without complications: Principal | ICD-10-CM

## 2013-08-08 LAB — GLUCOSE, CAPILLARY
Glucose-Capillary: 166 mg/dL — ABNORMAL HIGH (ref 70–99)
Glucose-Capillary: 162 mg/dL — ABNORMAL HIGH (ref 70–99)
Glucose-Capillary: 138 mg/dL — ABNORMAL HIGH (ref 70–99)

## 2013-08-08 MED ORDER — SODIUM CHLORIDE 0.9 % IV SOLN
INTRAVENOUS | Status: DC
  Administered 2013-08-08: 19:00:00 via INTRAVENOUS

## 2013-08-08 MED ORDER — TRACE MINERALS CR-CU-F-FE-I-MN-MO-SE-ZN IV SOLN
INTRAVENOUS | Status: AC
  Administered 2013-08-08: 18:00:00 via INTRAVENOUS
  Filled 2013-08-08: qty 3000

## 2013-08-08 MED ORDER — FAT EMULSION 20 % IV EMUL
250.0000 mL | INTRAVENOUS | Status: AC
  Administered 2013-08-08: 250 mL via INTRAVENOUS
  Filled 2013-08-08: qty 250

## 2013-08-08 NOTE — Progress Notes (Signed)
   Subjective  No complaint   Objective  Crohn's disease, TI stricture, Dr Olevia Perches pt. Surgery following for eventual TI resection, date not set yet. NG tube removed yesterday, tolerating clear liquids. Vital signs in last 24 hours: Temp:  [97.4 F (36.3 C)-98.1 F (36.7 C)] 98.1 F (36.7 C) (06/06 1500) Pulse Rate:  [69-78] 69 (06/06 1500) Resp:  [16-18] 18 (06/06 1500) BP: (124-141)/(81-91) 130/85 mmHg (06/06 1500) SpO2:  [93 %-95 %] 93 % (06/06 1500) Last BM Date: 08/08/13 General:    white male in NAD Heart:  Regular rate and rhythm; no murmurs Lungs: Respirations even and unlabored, lungs CTA bilaterally Abdomen:  Soft, nontender and nondistended. Normal bowel sounds. Extremities:  Without edema. Neurologic:  Alert and oriented,  grossly normal neurologically. Psych:  Cooperative. Normal mood and affect.  Intake/Output from previous day: Aug 10, 2022 0701 - 06/06 0700 In: 3773.9 [I.V.:1762; TPN:2011.9] Out: 500 [Urine:400; Emesis/NG output:100] Intake/Output this shift: Total I/O In: 780 [P.O.:780] Out: -   Lab Results: No results found for this basename: WBC, HGB, HCT, PLT,  in the last 72 hours BMET  Recent Labs  08/06/13 0450 08/09/2013 0522  NA 139 140  K 4.0 4.0  CL 103 104  CO2 22 24  GLUCOSE 146* 151*  BUN 19 21  CREATININE 0.99 1.02  CALCIUM 8.5 8.8   LFT  Recent Labs  08/06/13 0450  PROT 6.4  ALBUMIN 2.9*  AST 18  ALT 15  ALKPHOS 93  BILITOT 0.2*   PT/INR  Recent Labs  2013/08/09 1800  LABPROT 13.5  INR 1.05    Studies/Results: Dg Abd 2 Views  2013/08/09   CLINICAL DATA:  Follow up partial small bowel obstruction.  EXAM: ABDOMEN - 2 VIEW  COMPARISON:  08/06/2013  FINDINGS: Enteric tube remains in place with tip in the expected region of the distal stomach or duodenal bulb. Right upper quadrant surgical clips are again seen. Residual oral contrast material in the left colon does not appear significantly changed. A few small air-fluid levels  are again seen in the right mid to upper abdomen, slightly less prominent than on the prior study. There is otherwise a relative paucity of small bowel gas. No intraperitoneal free air is identified. Mild lumbar dextroscoliosis is again seen. Mild elevation the right hemidiaphragm does not appear significantly changed.  IMPRESSION: A few small air-fluid levels in the right abdomen, slightly less prominent than on the prior study and suggestive of improving partial small bowel obstruction.   Electronically Signed   By: Logan Bores   On: 2013-08-09 08:34       Assessment / Plan:   Partial TI obstruction due to Crohn's disease, responding to bowl rest and NG suction, steroids. Will need surgery, timing not determined yet. I have spoken to the daughter.  Recheck KUB in am.  Active Problems:   HTN (hypertension)   Sleep apnea   Hypothyroidism   Crohn's ileitis   SBO (small bowel obstruction)   Dehydration   Acute respiratory failure with hypoxia   CKD (chronic kidney disease), stage III   Metabolic acidosis     LOS: 5 days   Lafayette Dragon  08/08/2013, 4:08 PM

## 2013-08-08 NOTE — Progress Notes (Signed)
PARENTERAL NUTRITION CONSULT NOTE - FOLLOW UP  Pharmacy Consult:  TPN Indication: Crohn's ileitis / SBO  Allergies  Allergen Reactions  . Coconut Flavor Other (See Comments)    Does not eat because of crohn's     Patient Measurements: Height: 6' (182.9 cm) Weight: 249 lb 12.5 oz (113.3 kg) IBW/kg (Calculated) : 77.6  Vital Signs: Temp: 98 F (36.7 C) (06/06 0513) Temp src: Oral (06/06 0513) BP: 124/81 mmHg (06/06 0513) Pulse Rate: 78 (06/06 0513) Intake/Output from previous day: 06/05 0701 - 06/06 0700 In: 3773.9 [I.V.:1762; TPN:2011.9] Out: 500 [Urine:400; Emesis/NG output:100]  Labs:  Recent Labs  08/07/13 1800  INR 1.05     Recent Labs  08/06/13 0439 08/06/13 0450 08/07/13 0522  NA  --  139 140  K  --  4.0 4.0  CL  --  103 104  CO2  --  22 24  GLUCOSE  --  146* 151*  BUN  --  19 21  CREATININE  --  0.99 1.02  CALCIUM  --  8.5 8.8  MG  --  2.1 2.3  PHOS  --  2.3 3.0  PROT  --  6.4  --   ALBUMIN  --  2.9*  --   AST  --  18  --   ALT  --  15  --   ALKPHOS  --  93  --   BILITOT  --  0.2*  --   PREALBUMIN  --  20.6  --   TRIG 102  --   --    Estimated Creatinine Clearance: 95.1 ml/min (by C-G formula based on Cr of 1.02).    Recent Labs  08/07/13 1617 08/07/13 2334 08/08/13 0548  GLUCAP 175* 200* 166*      Insulin Requirements in the past 24 hours:  9 units SSI   Assessment:  84 YOM with history of bowel obstruction a few weeks ago presented on 08/03/13 with complaints of severe abdominal cramping, abdominal distention, and vomiting that started several hours prior to arrival. He has a history of Crohn's disease, obesity, and multiple abdominal surgeries with adhesions and recurrent small bowel obstructions. Last admission was in April of 2015 for pSBO.  Pharmacy managing TPN for nutritional support.  GI: Crohn's ileitis / SBO / unspecified constipation / obesity.  GI started steroids for significant underlying fibrosis.  6/2 CT: consistent  with Crohn's flare, likely has a chronic underlying stricture.  Prealbumin WNL at 20.6.  SBO improving.  NG tube removed. Endo: hypothyroid on IV Synthroid, TSH WNL.  No hx DM - CBGs 154-200 on SSI Lytes: all WNL on 6/5. No labs today Renal: CKD3 / BPH - SCr 1.02 - low UOP, NS with 63mq KCL at 40 ml/hr Pulm: OSA - stable on RA Cards: HTN - VSS, PRN hydralazine Hepatobil: LFTs / tbili / TG WNL ID: Cipro/Flagyl D#5 (6/2 >> ) for GI coverage.  Afebrile, WBC WNL.  UCx negative. Best Practices: SQ heparin TPN Access: PICC placed 6/3 TPN day#: 3 (6/3 >> )  Current Nutrition:  TPN  Nutritional Goals:  2100-2300 kCal,105-120 grams of protein per day   Plan:  - Advance Clinimix E 5/15 to 100 ml/hr and continue IVFE at 10 ml/hr.  TPN to provide 2184 kCal and 120 gm of protein daily.   - Decrease IV fluids to 253mhr - Daily multivitamin and trace elements - Continue SSI moderate Q6H increase to 25 units regular insulin in TPN - BMET in AM  Heide Guile, PharmD, BCPS Clinical Pharmacist Pager [phone]   08/08/2013, 7:23 AM

## 2013-08-08 NOTE — Progress Notes (Signed)
Patient ID: Bruce [name], male   DOB: 07-18-1950, 63 y.o.   MRN: [id number]    Subjective: No complaints this morning. NG is out. No pain or distention. Has had a couple of loose bowel movements.  Objective: Vital signs in last 24 hours: Temp:  [97.4 F (36.3 C)-98 F (36.7 C)] 98 F (36.7 C) (06/06 0513) Pulse Rate:  [78] 78 (06/06 0513) Resp:  [16-18] 18 (06/06 0513) BP: (124-141)/(81-91) 124/81 mmHg (06/06 0513) SpO2:  [95 %] 95 % (06/06 0513) Last BM Date: 2013/08/12  Intake/Output from previous day: 08/13/2022 0701 - 06/06 0700 In: 3773.9 [I.V.:1762; TPN:2011.9] Out: 500 [Urine:400; Emesis/NG output:100] Intake/Output this shift:    General appearance: alert, cooperative and no distress GI: normal findings: soft, non-tender and nondistended  Lab Results:  No results found for this basename: WBC, HGB, HCT, PLT,  in the last 72 hours BMET  Recent Labs  08/06/13 0450 08/12/2013 0522  NA 139 140  K 4.0 4.0  CL 103 104  CO2 22 24  GLUCOSE 146* 151*  BUN 19 21  CREATININE 0.99 1.02  CALCIUM 8.5 8.8     Studies/Results: Dg Abd 2 Views  08/12/2013   CLINICAL DATA:  Follow up partial small bowel obstruction.  EXAM: ABDOMEN - 2 VIEW  COMPARISON:  08/06/2013  FINDINGS: Enteric tube remains in place with tip in the expected region of the distal stomach or duodenal bulb. Right upper quadrant surgical clips are again seen. Residual oral contrast material in the left colon does not appear significantly changed. A few small air-fluid levels are again seen in the right mid to upper abdomen, slightly less prominent than on the prior study. There is otherwise a relative paucity of small bowel gas. No intraperitoneal free air is identified. Mild lumbar dextroscoliosis is again seen. Mild elevation the right hemidiaphragm does not appear significantly changed.  IMPRESSION: A few small air-fluid levels in the right abdomen, slightly less prominent than on the prior study and suggestive of  improving partial small bowel obstruction.   Electronically Signed   By: Logan Bores   On: 08/12/13 08:34   Dg Abd 2 Views  08/06/2013   CLINICAL DATA:  Evaluate for small bowel obstruction.  EXAM: ABDOMEN - 2 VIEW  COMPARISON:  08/04/2013 and 08/03/2013  FINDINGS: Nasogastric tube tip is near the duodenum bulb region. There is oral contrast in the left colon. Few gas-filled loops of bowel in the mid abdomen. Few air-fluid levels in the right upper abdomen. No evidence for free air.  IMPRESSION: The degree of small bowel obstruction has decreased since 08/03/2013. Oral contrast has advanced into the left colon. There continues to be a few gas-filled loops of small bowel and findings probably represent a partial small bowel obstruction.   Electronically Signed   By: Markus Daft M.D.   On: 08/06/2013 08:37    Anti-infectives: Anti-infectives   Start     Dose/Rate Route Frequency Ordered Stop   08/04/13 1315  ciprofloxacin (CIPRO) IVPB 400 mg     400 mg 200 mL/hr over 60 Minutes Intravenous Every 12 hours 08/04/13 1312     08/04/13 1300  metroNIDAZOLE (FLAGYL) IVPB 500 mg     500 mg 100 mL/hr over 60 Minutes Intravenous 4 times per day 08/04/13 1213        Assessment/Plan: Crohn's disease with stricture and recurrent obstruction. Currently improved on conservative management. Advanced to clear liquid diet. Likely benefit from resection due to increasingly frequent obstructions. He would  like to have that done this hospitalization.     LOS: 5 days    Edward Jolly 08/08/2013

## 2013-08-08 NOTE — Progress Notes (Signed)
[facility] PROGRESS NOTE  Bruce [name] KTG:[id number] DOB: 08-01-1950 DOA: 08/03/2013 PCP: [name], MD  Brief narrative 63 y.o. year old male with prior hx/o crohns disease (s/p multiple bowel surgeries), HTN who presented with abdominal pain. Pt stated that he had severe sudden onset of abdominal pain, non bloody emesis and diarrhea while eating earlier today. Has also had severe generalized abd pain. Recently admission 1 month prior for high grade SBO. Surgery was consulted and an NGT was placed. Surgery recommended non operative management. Pt/wife felt that pain was significantly worse than previous admission, though pain much improved s/p NGT in ER. In the ER he was found ti have tachycardia with rates in the 120s. WBC 14.5. Hgb 12.4. Cr 1.34. KUB w/ high grade SBO. Lactate 2.3. Lipase WNL. Patient admitted to stepdown.  Assessment/Plan: Crohn's ileitis /Small bowel obstruction  -cont abx ( cipro and flagyl) -cont Budesonide  -GI suspects he has a combination of acute Crohn's exacerbation in setting of fibrostenosing disease that likely will not respond to med rxn alone and will need resection  -PICC placed and beganTNA . SBO improved on x ray. NG tube removed and started on clear liquids.  Surgery to decide on timing of surgery, inpt next week vs outpt -Dr. [name] is his GI  in [place]. Dr.Hung suggests post op pt will require anti-TNF or immunomodulator therapy to reduce the chance of recurrence at the anastamosis.   Metabolic acidosis  -resolved   Acute respiratory failure with hypoxia/Sleep apnea  Resolved. -likely from mechanical issues from recent distended abdomen and SBO  -cont O2  -CPAP per respiratory. pt refused saying does not use at home and actually gave away his machine.  Dehydration/tachycardia  D/c fluids  Hypertension  -BP stable -prn Hydralazine   Hypothyroidism  -continue IV   CKD (chronic kidney disease), stage III   DVT prophylaxis:   Subcutaneous heparin    Antibiotics:  Cipro 6/2 >>>  Flagyl 6/2 >>>      Code Status: full code Family Communication: Daughter at bedside Disposition Plan: home once once improved , may have bowel  surgery next week   Consultants:  CCS   Dr Benson Norway  Procedures:  none  Antibiotics:  cipro and flagyl since 6/2  HPI/Subjective: Patient feels better. No abdominal pain. Tolerating tears  Objective: Filed Vitals:   08/08/13 0513  BP: 124/81  Pulse: 78  Temp: 98 F (36.7 C)  Resp: 18    Intake/Output Summary (Last 24 hours) at 08/08/13 1430 Last data filed at 08/08/13 0600  Gross per 24 hour  Intake 3773.91 ml  Output      0 ml  Net 3773.91 ml   Filed Weights   08/04/13 0230 08/05/13 1128 08/07/13 0500  Weight: 116.3 kg (256 lb 6.3 oz) 104.327 kg (230 lb) 113.3 kg (249 lb 12.5 oz)    Exam:   General:  Elderly male in NAD  HEENT: NG in place, moist mucosa  Chest: clear , no added sounds   CVS: N S1&S2, no MRG  Abd: soft, NT, ND, BS+  Ext: warm, no edema  CNS: AAOX3   Data Reviewed: Basic Metabolic Panel:  Recent Labs Lab 08/03/13 2258 08/04/13 0156 08/04/13 0550 08/05/13 0245 08/06/13 0450 08/07/13 0522  NA 139  --  136* 138 139 140  K 4.3  --  5.1 4.6 4.0 4.0  CL 103  --  102 104 103 104  CO2 23  --  22 23 22 24   GLUCOSE  161*  --  167* 178* 146* 151*  BUN 14  --  19 21 19 21   CREATININE 1.34 1.32 1.25 1.07 0.99 1.02  CALCIUM 9.4  --  8.5 8.2* 8.5 8.8  MG  --   --   --   --  2.1 2.3  PHOS  --   --   --   --  2.3 3.0   Liver Function Tests:  Recent Labs Lab 08/03/13 2258 08/04/13 0550 08/05/13 0245 08/06/13 0450  AST 24 26 19 18   ALT 22 25 17 15   ALKPHOS 112 100 82 93  BILITOT 0.4 0.3 0.3 0.2*  PROT 7.7 7.1 6.2 6.4  ALBUMIN 3.8 3.3* 2.8* 2.9*    Recent Labs Lab 08/03/13 2258  LIPASE 47   No results found for this basename: AMMONIA,  in the last 168 hours CBC:  Recent Labs Lab 08/03/13 2258 08/04/13 0156  08/04/13 0550 08/05/13 0245  WBC 14.5* 17.2* 16.5* 8.5  NEUTROABS 11.8*  --  14.6*  --   HGB 12.4* 13.2 12.1* 10.1*  HCT 38.7* 41.3 38.4* 33.4*  MCV 79.6 80.0 79.8 82.1  PLT 450* 470* 444* 381   Cardiac Enzymes:  Recent Labs Lab 08/04/13 0156 08/04/13 0550 08/04/13 1335  TROPONINI <0.30 <0.30 <0.30   BNP (last 3 results) No results found for this basename: PROBNP,  in the last 8760 hours CBG:  Recent Labs Lab 08/07/13 0803 08/07/13 1220 08/07/13 1617 08/07/13 2334 08/08/13 0548  GLUCAP 150* 156* 175* 200* 166*    Recent Results (from the past 240 hour(s))  MRSA PCR SCREENING     Status: None   Collection Time    08/04/13  2:26 AM      Result Value Ref Range Status   MRSA by PCR NEGATIVE  NEGATIVE Final   Comment:            The GeneXpert MRSA Assay (FDA     approved for NASAL specimens     only), is one component of a     comprehensive MRSA colonization     surveillance program. It is not     intended to diagnose MRSA     infection nor to guide or     monitor treatment for     MRSA infections.  URINE CULTURE     Status: None   Collection Time    08/04/13  6:31 PM      Result Value Ref Range Status   Specimen Description URINE, RANDOM   Final   Special Requests NONE   Final   Culture  Setup Time     Final   Value: 08/04/2013 19:34     Performed at [facility]     Final   Value: NO GROWTH     Performed at [facility]   Culture     Final   Value: NO GROWTH     Performed at [facility]   Report Status 08/05/2013 FINAL   Final     Studies: Dg Abd 2 Views  08/07/2013   CLINICAL DATA:  Follow up partial small bowel obstruction.  EXAM: ABDOMEN - 2 VIEW  COMPARISON:  08/06/2013  FINDINGS: Enteric tube remains in place with tip in the expected region of the distal stomach or duodenal bulb. Right upper quadrant surgical clips are again seen. Residual oral contrast material in the left colon does not appear  significantly changed. A few small air-fluid levels are  again seen in the right mid to upper abdomen, slightly less prominent than on the prior study. There is otherwise a relative paucity of small bowel gas. No intraperitoneal free air is identified. Mild lumbar dextroscoliosis is again seen. Mild elevation the right hemidiaphragm does not appear significantly changed.  IMPRESSION: A few small air-fluid levels in the right abdomen, slightly less prominent than on the prior study and suggestive of improving partial small bowel obstruction.   Electronically Signed   By: Logan Bores   On: 08/07/2013 08:34    Scheduled Meds: . ciprofloxacin  400 mg Intravenous Q12H  . heparin  5,000 Units Subcutaneous 3 times per day  . insulin aspart  0-15 Units Subcutaneous 4 times per day  . levothyroxine  87.5 mcg Intravenous Daily  . methylPREDNISolone (SOLU-MEDROL) injection  60 mg Intravenous Q12H  . metronidazole  500 mg Intravenous 4 times per day  . sodium chloride  3 mL Intravenous Q12H   Continuous Infusions: . sodium chloride    . 0.9 % NaCl with KCl 20 mEq / L 40 mL/hr at 08/08/13 1030  . Marland KitchenTPN (CLINIMIX-E) Adult 83 mL/hr at 08/07/13 1726   And  . fat emulsion 250 mL (08/07/13 1726)  . Marland KitchenTPN (CLINIMIX-E) Adult     And  . fat emulsion    . octreotide (SANDOSTATIN) infusion 50 mcg/hr (08/07/13 1859)      Time spent: 25 minutes    Bruce [name]  [facility] Pager [phone] If 7PM-7AM, please contact night-coverage at [email or web address], password [facility] 08/08/2013, 2:30 PM  LOS: 5 days

## 2013-08-09 ENCOUNTER — Inpatient Hospital Stay (HOSPITAL_COMMUNITY): Payer: 59

## 2013-08-09 LAB — GLUCOSE, CAPILLARY
Glucose-Capillary: 173 mg/dL — ABNORMAL HIGH (ref 70–99)
Glucose-Capillary: 205 mg/dL — ABNORMAL HIGH (ref 70–99)
Glucose-Capillary: 220 mg/dL — ABNORMAL HIGH (ref 70–99)
Glucose-Capillary: 236 mg/dL — ABNORMAL HIGH (ref 70–99)

## 2013-08-09 LAB — BASIC METABOLIC PANEL
BUN: 24 mg/dL — ABNORMAL HIGH (ref 6–23)
CO2: 25 mEq/L (ref 19–32)
Calcium: 8.6 mg/dL (ref 8.4–10.5)
Chloride: 100 mEq/L (ref 96–112)
Creatinine, Ser: 1.09 mg/dL (ref 0.50–1.35)
GFR calc Af Amer: 81 mL/min — ABNORMAL LOW (ref >90)
GFR calc non Af Amer: 70 mL/min — ABNORMAL LOW (ref >90)
Glucose, Bld: 244 mg/dL — ABNORMAL HIGH (ref 70–99)
Potassium: 4.4 mEq/L (ref 3.7–5.3)
Sodium: 137 mEq/L (ref 137–147)

## 2013-08-09 MED ORDER — TRACE MINERALS CR-CU-F-FE-I-MN-MO-SE-ZN IV SOLN
INTRAVENOUS | Status: AC
  Administered 2013-08-09: 19:00:00 via INTRAVENOUS
  Filled 2013-08-09: qty 3000

## 2013-08-09 MED ORDER — FAT EMULSION 20 % IV EMUL
250.0000 mL | INTRAVENOUS | Status: AC
  Administered 2013-08-09: 250 mL via INTRAVENOUS
  Filled 2013-08-09: qty 250

## 2013-08-09 NOTE — Progress Notes (Signed)
   Subjective  No complaints   Objective  Pt with Crohn's disease of the TI, awaiting surgery next week for SBO. Tolerating clear liqwuids Pt had 2 small BM's yesterday. He denies abd. Pain. Vital signs in last 24 hours: Temp:  [97.5 F (36.4 C)-98.1 F (36.7 C)] 97.8 F (36.6 C) (06/07 0503) Pulse Rate:  [69-75] 71 (06/07 0503) Resp:  [18] 18 (06/07 0503) BP: (126-148)/(85-98) 126/85 mmHg (06/07 0503) SpO2:  [93 %-96 %] 96 % (06/07 0503) Last BM Date: 08/08/13 General:    white male in NAD Heart:  Regular rate and rhythm; no murmurs Lungs: Respirations even and unlabored, lungs CTA bilaterally Abdomen:  Soft, nontender and nondistended. Normal bowel sounds.protuberanr, no ascites Extremities:  Without edema. Neurologic:  Alert and oriented,  grossly normal neurologically. Psych:  Cooperative. Normal mood and affect.  Intake/Output from previous day: 06/06 0701 - 06/07 0700 In: 2504 [P.O.:1140; I.V.:320; IV Piggyback:300; ESP:233] Out: -  Intake/Output this shift:    Lab Results: No results found for this basename: WBC, HGB, HCT, PLT,  in the last 72 hours BMET  Recent Labs  08/30/2013 0522 08/09/13 0447  NA 140 137  K 4.0 4.4  CL 104 100  CO2 24 25  GLUCOSE 151* 244*  BUN 21 24*  CREATININE 1.02 1.09  CALCIUM 8.8 8.6   LFT No results found for this basename: PROT, ALBUMIN, AST, ALT, ALKPHOS, BILITOT, BILIDIR, IBILI,  in the last 72 hours PT/INR  Recent Labs  08/30/13 1800  LABPROT 13.5  INR 1.05    Studies/Results: Dg Abd 2 Views  30-Aug-2013   CLINICAL DATA:  Follow up partial small bowel obstruction.  EXAM: ABDOMEN - 2 VIEW  COMPARISON:  08/06/2013  FINDINGS: Enteric tube remains in place with tip in the expected region of the distal stomach or duodenal bulb. Right upper quadrant surgical clips are again seen. Residual oral contrast material in the left colon does not appear significantly changed. A few small air-fluid levels are again seen in the right mid  to upper abdomen, slightly less prominent than on the prior study. There is otherwise a relative paucity of small bowel gas. No intraperitoneal free air is identified. Mild lumbar dextroscoliosis is again seen. Mild elevation the right hemidiaphragm does not appear significantly changed.  IMPRESSION: A few small air-fluid levels in the right abdomen, slightly less prominent than on the prior study and suggestive of improving partial small bowel obstruction.   Electronically Signed   By: Logan Bores   On: 08/30/2013 08:34       Assessment / Plan:   Continue clear liquids till surgery next week, spoke with Dr Excell Seltzer, Stop Octreotide, continue TNA, no clinical evidence for SBO, responded to NG suction and steroids, but needs surgery because of fibrosing disease.  Active Problems:   HTN (hypertension)   Sleep apnea   Hypothyroidism   Crohn's ileitis   SBO (small bowel obstruction)   Dehydration   Acute respiratory failure with hypoxia   CKD (chronic kidney disease), stage III   Metabolic acidosis     LOS: 6 days   [place] Dragon  08/09/2013, 8:02 AM

## 2013-08-09 NOTE — Progress Notes (Signed)
PARENTERAL NUTRITION CONSULT NOTE - FOLLOW UP  Pharmacy Consult:  TPN Indication: Crohn's ileitis / SBO  Allergies  Allergen Reactions  . Coconut Flavor Other (See Comments)    Does not eat because of crohn's     Patient Measurements: Height: 6' (182.9 cm) Weight: 249 lb 12.5 oz (113.3 kg) IBW/kg (Calculated) : 77.6  Vital Signs: Temp: 97.8 F (36.6 C) (06/07 0503) Temp src: Oral (06/06 2223) BP: 126/85 mmHg (06/07 0503) Pulse Rate: 71 (06/07 0503) Intake/Output from previous day: 06/06 0701 - 06/07 0700 In: 2504 [P.O.:1140; I.V.:320; IV Piggyback:300; YTR:173] Out: -   Labs:  Recent Labs  08/07/13 1800  INR 1.05     Recent Labs  08/07/13 0522 08/09/13 0447  NA 140 137  K 4.0 4.4  CL 104 100  CO2 24 25  GLUCOSE 151* 244*  BUN 21 24*  CREATININE 1.02 1.09  CALCIUM 8.8 8.6  MG 2.3  --   PHOS 3.0  --    Estimated Creatinine Clearance: 89 ml/min (by C-G formula based on Cr of 1.09).    Recent Labs  08/08/13 1722 08/09/13 0004 08/09/13 0556  GLUCAP 138* 220* 236*      Insulin Requirements in the past 24 hours:  15 units SSI   Assessment:  65 YOM with history of bowel obstruction a few weeks ago presented on 08/03/13 with complaints of severe abdominal cramping, abdominal distention, and vomiting that started several hours prior to arrival. He has a history of Crohn's disease, obesity, and multiple abdominal surgeries with adhesions and recurrent small bowel obstructions. Last admission was in April of 2015 for pSBO.  Pharmacy managing TPN for nutritional support.  GI: Crohn's ileitis / SBO / unspecified constipation / obesity.  GI started steroids for significant underlying fibrosis.  6/2 CT: consistent with Crohn's flare, likely has a chronic underlying stricture.  Prealbumin WNL at 20.6.  SBO improving.  NG tube removed.  Tolerating clear liquids.  Possible GI surgery this week. Endo: hypothyroid on IV Synthroid, TSH WNL.  No hx DM - CBGs 138-236 on  SSI and 25 units in the TPN Lytes: K 4.4, Na 137 Renal: CKD3 / BPH - SCr 1.09 - low UOP, NS at 36m/hr Pulm: OSA - stable on RA Cards: HTN - VSS, PRN hydralazine Hepatobil: LFTs / tbili / TG WNL ID: Cipro/Flagyl D#6 (6/2 >> ) for GI coverage.  Afebrile, WBC WNL.  UCx negative. Best Practices: SQ heparin TPN Access: PICC placed 6/3 TPN day#: 4 (6/3 >> )  Current Nutrition:  TPN  Nutritional Goals:  2100-2300 kCal,105-120 grams of protein per day   Plan:  - Continue Clinimix E 5/15 at 100 ml/hr and continue IVFE at 10 ml/hr.  TPN to provide 2184 kCal and 120 gm of protein daily.   - Daily multivitamin and trace elements - Continue SSI moderate Q6H increase to 40 units regular insulin in TPN - TPN labs in AM   JHeide Guile PharmD, BProvidence Medical CenterClinical Pharmacist Pager [phone]  08/09/2013, 8:25 AM

## 2013-08-09 NOTE — Progress Notes (Signed)
[facility] PROGRESS NOTE  Bruce [name] FFM:[id number] DOB: [birth year] DOA: 08/03/2013 PCP: [name], MD  Brief narrative 63 y.o. year old male with prior hx/o crohns disease (s/p multiple bowel surgeries), HTN who presented with abdominal pain. Pt stated that he had severe sudden onset of abdominal pain, non bloody emesis and diarrhea while eating earlier today. Has also had severe generalized abd pain. Recently admission 1 month prior for high grade SBO. Surgery was consulted and an NGT was placed. Surgery recommended non operative management. Pt/wife felt that pain was significantly worse than previous admission, though pain much improved s/p NGT in ER. In the ER he was found ti have tachycardia with rates in the 120s. WBC 14.5. Hgb 12.4. Cr 1.34. KUB w/ high grade SBO. Lactate 2.3. Lipase WNL. Patient admitted to stepdown.  Assessment/Plan: Crohn's ileitis /Small bowel obstruction  -cont abx ( cipro and flagyl) -cont Budesonide  -GI suspects he has a combination of acute Crohn's exacerbation in setting of fibrostenosing disease that likely will not respond to med rxn alone and will need resection  -PICC placed and beganTNA . SBO improved on x ray. NG tube removed and started on clear liquids.  -plan on sx this week -Dr. [name] is his GI  in [place]. Dr.Hung suggests post op pt will require anti-TNF or immunomodulator therapy to reduce the chance of recurrence at the anastamosis.   Metabolic acidosis  -resolved   Acute respiratory failure with hypoxia/Sleep apnea  Resolved. -likely from mechanical issues from recent distended abdomen and SBO  -cont O2  -CPAP per respiratory. pt refused saying does not use at home and actually gave away his machine.  Dehydration/tachycardia  Off  fluids  Hypertension  -BP stable -prn Hydralazine   Hypothyroidism  -continue IV   CKD (chronic kidney disease), stage III   DVT prophylaxis:  Subcutaneous heparin    Antibiotics:   Cipro 6/2 >>>  Flagyl 6/2 >>>      Code Status: full code Family Communication: none at bedside Disposition Plan: home  After surgery   Consultants:  CCS   Dr Benson Norway  Procedures:  none  Antibiotics:  cipro and flagyl since 6/2  HPI/Subjective: Patient notes minimal abdominal pain. Tolerating clears  Objective: Filed Vitals:   08/09/13 0503  BP: 126/85  Pulse: 71  Temp: 97.8 F (36.6 C)  Resp: 18    Intake/Output Summary (Last 24 hours) at 08/09/13 1120 Last data filed at 08/08/13 1500  Gross per 24 hour  Intake   2144 ml  Output      0 ml  Net   2144 ml   Filed Weights   08/04/13 0230 08/05/13 1128 08/07/13 0500  Weight: 116.3 kg (256 lb 6.3 oz) 104.327 kg (230 lb) 113.3 kg (249 lb 12.5 oz)    Exam:   General:  Elderly male in NAD  HEENT: NG in place, moist mucosa  Chest: clear , no added sounds   CVS: N S1&S2, no MRG  Abd: soft, nondistended, minimal lower quadrant tenderness, bowel sounds present  Ext: warm, no edema  CNS: AAOX3   Data Reviewed: Basic Metabolic Panel:  Recent Labs Lab 08/04/13 0550 08/05/13 0245 08/06/13 0450 08/07/13 0522 08/09/13 0447  NA 136* 138 139 140 137  K 5.1 4.6 4.0 4.0 4.4  CL 102 104 103 104 100  CO2 22 23 22 24 25   GLUCOSE 167* 178* 146* 151* 244*  BUN 19 21 19 21  24*  CREATININE 1.25 1.07 0.99 1.02 1.09  CALCIUM 8.5 8.2* 8.5 8.8 8.6  MG  --   --  2.1 2.3  --   PHOS  --   --  2.3 3.0  --    Liver Function Tests:  Recent Labs Lab 08/03/13 2258 08/04/13 0550 08/05/13 0245 08/06/13 0450  AST 24 26 19 18   ALT 22 25 17 15   ALKPHOS 112 100 82 93  BILITOT 0.4 0.3 0.3 0.2*  PROT 7.7 7.1 6.2 6.4  ALBUMIN 3.8 3.3* 2.8* 2.9*    Recent Labs Lab 08/03/13 2258  LIPASE 47   No results found for this basename: AMMONIA,  in the last 168 hours CBC:  Recent Labs Lab 08/03/13 2258 08/04/13 0156 08/04/13 0550 08/05/13 0245  WBC 14.5* 17.2* 16.5* 8.5  NEUTROABS 11.8*  --  14.6*  --   HGB  12.4* 13.2 12.1* 10.1*  HCT 38.7* 41.3 38.4* 33.4*  MCV 79.6 80.0 79.8 82.1  PLT 450* 470* 444* 381   Cardiac Enzymes:  Recent Labs Lab 08/04/13 0156 08/04/13 0550 08/04/13 1335  TROPONINI <0.30 <0.30 <0.30   BNP (last 3 results) No results found for this basename: PROBNP,  in the last 8760 hours CBG:  Recent Labs Lab 08/08/13 0548 08/08/13 1206 08/08/13 1722 08/09/13 0004 08/09/13 0556  GLUCAP 166* 162* 138* 220* 236*    Recent Results (from the past 240 hour(s))  MRSA PCR SCREENING     Status: None   Collection Time    08/04/13  2:26 AM      Result Value Ref Range Status   MRSA by PCR NEGATIVE  NEGATIVE Final   Comment:            The GeneXpert MRSA Assay (FDA     approved for NASAL specimens     only), is one component of a     comprehensive MRSA colonization     surveillance program. It is not     intended to diagnose MRSA     infection nor to guide or     monitor treatment for     MRSA infections.  URINE CULTURE     Status: None   Collection Time    08/04/13  6:31 PM      Result Value Ref Range Status   Specimen Description URINE, RANDOM   Final   Special Requests NONE   Final   Culture  Setup Time     Final   Value: 08/04/2013 19:34     Performed at [place]     Final   Value: NO GROWTH     Performed at [facility]   Culture     Final   Value: NO GROWTH     Performed at [facility]   Report Status 08/05/2013 FINAL   Final     Studies: No results found.  Scheduled Meds: . ciprofloxacin  400 mg Intravenous Q12H  . heparin  5,000 Units Subcutaneous 3 times per day  . insulin aspart  0-15 Units Subcutaneous 4 times per day  . levothyroxine  87.5 mcg Intravenous Daily  . methylPREDNISolone (SOLU-MEDROL) injection  60 mg Intravenous Q12H  . metronidazole  500 mg Intravenous 4 times per day  . sodium chloride  3 mL Intravenous Q12H   Continuous Infusions: . sodium chloride 20 mL/hr at 08/08/13 1900   . Marland KitchenTPN (CLINIMIX-E) Adult 100 mL/hr at 08/08/13 1807   And  . fat emulsion 250 mL (08/08/13 1806)  . Marland KitchenTPN (  CLINIMIX-E) Adult     And  . fat emulsion        Time spent: 25 minutes    Bruce [name]  [facility] Pager [phone] If 7PM-7AM, please contact night-coverage at [email or web address], password [facility] 08/09/2013, 11:20 AM  LOS: 6 days

## 2013-08-09 NOTE — Progress Notes (Signed)
Patient ID: Bruce [name], male   DOB: 1950/11/19, 63 y.o.   MRN: [id number] Patient ID: Bruce [name], male   DOB: [birth year], 63 y.o.   MRN: [id number]    Subjective: No complaints this morning. Tolerating clear liquids without difficulty.. No pain or distention. Has had a couple of loose bowel movements.  Objective: Vital signs in last 24 hours: Temp:  [97.5 F (36.4 C)-98.1 F (36.7 C)] 97.8 F (36.6 C) (06/07 0503) Pulse Rate:  [69-75] 71 (06/07 0503) Resp:  [18] 18 (06/07 0503) BP: (126-148)/(85-98) 126/85 mmHg (06/07 0503) SpO2:  [93 %-96 %] 96 % (06/07 0503) Last BM Date: 08/08/13  Intake/Output from previous day: 06/06 0701 - 06/07 0700 In: 2504 [P.O.:1140; I.V.:320; IV Piggyback:300; DIX:784] Out: -  Intake/Output this shift:    General appearance: alert, cooperative and no distress GI: normal findings: soft, non-tender and nondistended  Lab Results:  No results found for this basename: WBC, HGB, HCT, PLT,  in the last 72 hours BMET  Recent Labs  08/07/13 0522 08/09/13 0447  NA 140 137  K 4.0 4.4  CL 104 100  CO2 24 25  GLUCOSE 151* 244*  BUN 21 24*  CREATININE 1.02 1.09  CALCIUM 8.8 8.6     Studies/Results: No results found.  Anti-infectives: Anti-infectives   Start     Dose/Rate Route Frequency Ordered Stop   08/04/13 1315  ciprofloxacin (CIPRO) IVPB 400 mg     400 mg 200 mL/hr over 60 Minutes Intravenous Every 12 hours 08/04/13 1312     08/04/13 1300  metroNIDAZOLE (FLAGYL) IVPB 500 mg     500 mg 100 mL/hr over 60 Minutes Intravenous 4 times per day 08/04/13 1213        Assessment/Plan: Crohn's disease with stricture and recurrent obstruction. Currently improved on conservative management. tolerated clear liquid diet. Will leave on clear liquid diet only in anticipation of surgery in the near future. Plan resection of recurrent stricture this week.    LOS: 6 days    Edward Jolly 08/09/2013

## 2013-08-10 LAB — CBC
HCT: 36.6 % — ABNORMAL LOW (ref 39.0–52.0)
Hemoglobin: 11.2 g/dL — ABNORMAL LOW (ref 13.0–17.0)
MCH: 24.5 pg — ABNORMAL LOW (ref 26.0–34.0)
MCHC: 30.6 g/dL (ref 30.0–36.0)
MCV: 80.1 fL (ref 78.0–100.0)
Platelets: 426 10*3/uL — ABNORMAL HIGH (ref 150–400)
RBC: 4.57 MIL/uL (ref 4.22–5.81)
RDW: 16.2 % — ABNORMAL HIGH (ref 11.5–15.5)
WBC: 18.4 10*3/uL — ABNORMAL HIGH (ref 4.0–10.5)

## 2013-08-10 LAB — GLUCOSE, CAPILLARY
Glucose-Capillary: 197 mg/dL — ABNORMAL HIGH (ref 70–99)
Glucose-Capillary: 202 mg/dL — ABNORMAL HIGH (ref 70–99)
Glucose-Capillary: 203 mg/dL — ABNORMAL HIGH (ref 70–99)
Glucose-Capillary: 206 mg/dL — ABNORMAL HIGH (ref 70–99)
Glucose-Capillary: 207 mg/dL — ABNORMAL HIGH (ref 70–99)
Glucose-Capillary: 234 mg/dL — ABNORMAL HIGH (ref 70–99)

## 2013-08-10 LAB — COMPREHENSIVE METABOLIC PANEL
ALT: 28 U/L (ref 0–53)
AST: 27 U/L (ref 0–37)
Albumin: 2.9 g/dL — ABNORMAL LOW (ref 3.5–5.2)
Alkaline Phosphatase: 84 U/L (ref 39–117)
BUN: 24 mg/dL — ABNORMAL HIGH (ref 6–23)
CO2: 26 mEq/L (ref 19–32)
Calcium: 8.6 mg/dL (ref 8.4–10.5)
Chloride: 100 mEq/L (ref 96–112)
Creatinine, Ser: 1.08 mg/dL (ref 0.50–1.35)
GFR calc Af Amer: 82 mL/min — ABNORMAL LOW (ref >90)
GFR calc non Af Amer: 71 mL/min — ABNORMAL LOW (ref >90)
Glucose, Bld: 216 mg/dL — ABNORMAL HIGH (ref 70–99)
Potassium: 4.2 mEq/L (ref 3.7–5.3)
Sodium: 137 mEq/L (ref 137–147)
Total Bilirubin: 0.2 mg/dL — ABNORMAL LOW (ref 0.3–1.2)
Total Protein: 6.3 g/dL (ref 6.0–8.3)

## 2013-08-10 LAB — DIFFERENTIAL
Basophils Absolute: 0 10*3/uL (ref 0.0–0.1)
Basophils Relative: 0 % (ref 0–1)
Eosinophils Absolute: 0 10*3/uL (ref 0.0–0.7)
Eosinophils Relative: 0 % (ref 0–5)
Lymphocytes Relative: 11 % — ABNORMAL LOW (ref 12–46)
Lymphs Abs: 2.1 10*3/uL (ref 0.7–4.0)
Monocytes Absolute: 2 10*3/uL — ABNORMAL HIGH (ref 0.1–1.0)
Monocytes Relative: 11 % (ref 3–12)
Neutro Abs: 14.3 10*3/uL — ABNORMAL HIGH (ref 1.7–7.7)
Neutrophils Relative %: 78 % — ABNORMAL HIGH (ref 43–77)

## 2013-08-10 LAB — SURGICAL PCR SCREEN
MRSA, PCR: NEGATIVE
Staphylococcus aureus: NEGATIVE

## 2013-08-10 LAB — PREALBUMIN: Prealbumin: 30.5 mg/dL (ref 17.0–34.0)

## 2013-08-10 LAB — PHOSPHORUS: Phosphorus: 3.4 mg/dL (ref 2.3–4.6)

## 2013-08-10 LAB — MAGNESIUM: Magnesium: 2.5 mg/dL (ref 1.5–2.5)

## 2013-08-10 LAB — TRIGLYCERIDES: Triglycerides: 64 mg/dL (ref <150)

## 2013-08-10 MED ORDER — FAT EMULSION 20 % IV EMUL
250.0000 mL | INTRAVENOUS | Status: AC
  Administered 2013-08-10: 250 mL via INTRAVENOUS
  Filled 2013-08-10: qty 250

## 2013-08-10 MED ORDER — FAT EMULSION 20 % IV EMUL
250.0000 mL | INTRAVENOUS | Status: DC
  Filled 2013-08-10: qty 250

## 2013-08-10 MED ORDER — DEXTROSE 5 % IV SOLN
2.0000 g | INTRAVENOUS | Status: AC
  Administered 2013-08-11: 2 g via INTRAVENOUS
  Filled 2013-08-10: qty 2

## 2013-08-10 MED ORDER — CHLORHEXIDINE GLUCONATE 4 % EX LIQD
1.0000 "application " | Freq: Once | CUTANEOUS | Status: AC
  Administered 2013-08-10: 1 via TOPICAL
  Filled 2013-08-10: qty 15

## 2013-08-10 MED ORDER — M.V.I. ADULT IV INJ
INTRAVENOUS | Status: DC
  Filled 2013-08-10: qty 2000

## 2013-08-10 MED ORDER — TRACE MINERALS CR-CU-F-FE-I-MN-MO-SE-ZN IV SOLN
INTRAVENOUS | Status: AC
  Administered 2013-08-10: 18:00:00 via INTRAVENOUS
  Filled 2013-08-10: qty 2000

## 2013-08-10 NOTE — Progress Notes (Signed)
NUTRITION FOLLOW UP  Intervention:    TPN per Pharmacy   Diet advancement per MD   RD to continue to monitor  Provided diet education, "Fiber-Restricted Nutrition Therapy" for management of Crohn's flare ups   Nutrition Dx:   Inadequate oral intake related to SBO as evidenced by NPO status; progressing  Goal:   Pt to meet >/= 90% of their estimated nutrition needs   Monitor:   PO intake, weight trends, labs  Assessment:   64 y.o. year old male with prior hx/o crohns disease (s/p multiple bowel surgeries), HTN who presented with abdominal pain. Pt stated that he had severe sudden onset of abdominal pain, non bloody emesis and diarrhea while eating earlier on day of admission.   Pt's wife stated that pt would like to receive information on foods to avoid during flare ups. Provided pt with handout on "Fiber-Restricted Nutrition Therapy". She stated that pt has had crohn's for 10 years and does not follow a specific diet at home for management of flare ups. Expect good compliance.   Pt currently receiving TPN: Clinimix E 5/15 at 100 ml/hr and continue IVFE at 10 ml/hr. TPN to provide 2184 kCal (100% of estimated needs) and 120 gm (100% of estimated needs) of protein daily. Pt tolerating well.   Per MD note, pt is on clears today and possible SB resection tomorrow 6/9.   Height: Ht Readings from Last 1 Encounters:  08/05/13 6' (1.829 m)    Weight Status:   Wt Readings from Last 1 Encounters:  08/07/13 249 lb 12.5 oz (113.3 kg)    Re-estimated needs:  Kcal: 2100-2300  Protein: 105-120 grams  Fluid: 2.7 L/day  Skin: generalized edema; intact   Diet Order: Clear Liquid   Intake/Output Summary (Last 24 hours) at 08/10/13 1608 Last data filed at 08/10/13 1022  Gross per 24 hour  Intake    240 ml  Output      0 ml  Net    240 ml    Last BM: 6/6    Labs:   Recent Labs Lab 08/06/13 0450 08/07/13 0522 08/09/13 0447 08/10/13 0407  NA 139 140 137 137  K 4.0 4.0  4.4 4.2  CL 103 104 100 100  CO2 22 24 25 26   BUN 19 21 24* 24*  CREATININE 0.99 1.02 1.09 1.08  CALCIUM 8.5 8.8 8.6 8.6  MG 2.1 2.3  --  2.5  PHOS 2.3 3.0  --  3.4  GLUCOSE 146* 151* 244* 216*    CBG (last 3)   Recent Labs  08/10/13 0002 08/10/13 0604 08/10/13 1158  GLUCAP 202* 206* 234*    Scheduled Meds: . ciprofloxacin  400 mg Intravenous Q12H  . heparin  5,000 Units Subcutaneous 3 times per day  . insulin aspart  0-15 Units Subcutaneous 4 times per day  . levothyroxine  87.5 mcg Intravenous Daily  . methylPREDNISolone (SOLU-MEDROL) injection  60 mg Intravenous Q12H  . metronidazole  500 mg Intravenous 4 times per day  . sodium chloride  3 mL Intravenous Q12H    Continuous Infusions: . sodium chloride 20 mL/hr at 08/08/13 1900  . Marland KitchenTPN (CLINIMIX-E) Adult 100 mL/hr at 08/09/13 1850   And  . fat emulsion 250 mL (08/09/13 1849)  . Marland KitchenTPN (CLINIMIX-E) Adult     And  . fat emulsion      Carrolyn Leigh, BS Dietetic Intern Pager: [phone]

## 2013-08-10 NOTE — Progress Notes (Signed)
Subjective: He feels fine, he has had some loose stools, tolerating clears.  He was made NPO last PM.  I will put him back on clears, and Dr. Brantley Stage will discuss resection later.  Objective: Vital signs in last 24 hours: Temp:  [98.1 F (36.7 C)-98.7 F (37.1 C)] 98.1 F (36.7 C) (06/08 0542) Pulse Rate:  [68-73] 68 (06/08 0542) Resp:  [18-19] 18 (06/08 0542) BP: (115-134)/(58-86) 115/80 mmHg (06/08 0542) SpO2:  [93 %-98 %] 93 % (06/08 0542) Last BM Date: 08/08/13 690 PO recorded, No BM recorded Diet: clears/TNA started yesterday Afebrile, VSS WBC, up 18.4, Glucose up,  Prealbumin is 20.6 Film yesterday shows nonspecific nonobstructive pattern Intake/Output from previous day: 2022-09-07 0701 - 06/08 0700 In: 3455.4 [P.O.:690; I.V.:1085.4; IV Piggyback:800; TPN:880] Out: -  Intake/Output this shift:    General appearance: alert, cooperative and no distress Resp: clear to auscultation bilaterally GI: soft, non-tender; bowel sounds normal; no masses,  no organomegaly 2 scars, open cholecystectomy and open spleen.   Lab Results:   Recent Labs  08/10/13 0407  WBC 18.4*  HGB 11.2*  HCT 36.6*  PLT 426*    BMET  Recent Labs  Sep 06, 2013 0447 08/10/13 0407  NA 137 137  K 4.4 4.2  CL 100 100  CO2 25 26  GLUCOSE 244* 216*  BUN 24* 24*  CREATININE 1.09 1.08  CALCIUM 8.6 8.6   PT/INR  Recent Labs  08/07/13 1800  LABPROT 13.5  INR 1.05     Recent Labs Lab 08/03/13 2258 08/04/13 0550 08/05/13 0245 08/06/13 0450 08/10/13 0407  AST 24 26 19 18 27   ALT 22 25 17 15 28   ALKPHOS 112 100 82 93 84  BILITOT 0.4 0.3 0.3 0.2* 0.2*  PROT 7.7 7.1 6.2 6.4 6.3  ALBUMIN 3.8 3.3* 2.8* 2.9* 2.9*     Lipase     Component Value Date/Time   LIPASE 47 08/03/2013 2258     Studies/Results: Dg Abd 2 Views  09-06-2013   CLINICAL DATA:  Followup evaluation of small bowel obstruction.  EXAM: ABDOMEN - 2 VIEW  COMPARISON:  08/07/2013.  FINDINGS: Previously noted nasogastric  tube has been removed. Surgical clips project over the right upper quadrant of the abdomen, compatible with prior cholecystectomy. Gas is noted throughout the colon. No distal rectal gas is identified at this time. No pathologic distention of small bowel. No pneumoperitoneum.  IMPRESSION: 1. Nonspecific, nonobstructive bowel gas pattern, as above. 2. No pneumoperitoneum.   Electronically Signed   By: Vinnie Langton M.D.   On: Sep 06, 2013 12:13    Medications: . ciprofloxacin  400 mg Intravenous Q12H  . heparin  5,000 Units Subcutaneous 3 times per day  . insulin aspart  0-15 Units Subcutaneous 4 times per day  . levothyroxine  87.5 mcg Intravenous Daily  . methylPREDNISolone (SOLU-MEDROL) injection  60 mg Intravenous Q12H  . metronidazole  500 mg Intravenous 4 times per day  . sodium chloride  3 mL Intravenous Q12H    Assessment/Plan 1.  Crohn's disease with stricture at T1 and recurrent obstruction. S/p cholecystectomy, splenectomy, Prior intestinal perforation with diverticulitis and SBO 4/26-28/15, 1 episode 2010.  No prior colon or small bowel resection. 2. Hx of acute respiratory failure with hypoxia/Sleep apnea 3. Hypertension 4.  Hypothyroid 5.  Body mass index is 33.8  Plan:  Clears today and possible SB resection tomorrow.  On TNA, Steroids, Cipro and Flagyl.        LOS: 7 days    [name]  [name] 08/10/2013

## 2013-08-10 NOTE — Progress Notes (Addendum)
Unassigned patient Subjective: Since I last evaluated the patient, he seems to be doing somewhat better. Had 2 loose BM's today. Scheduled for surgery tomorrow.   Objective: Vital signs in last 24 hours: Temp:  [96.1 F (35.6 C)-98.1 F (36.7 C)] 96.1 F (35.6 C) (06/08 1324) Pulse Rate:  [68-88] 88 (06/08 1324) Resp:  [18-19] 18 (06/08 1324) BP: (115-141)/(78-86) 141/78 mmHg (06/08 1324) SpO2:  [93 %-95 %] 95 % (06/08 1324) Last BM Date: 08/08/13  Intake/Output from previous day: 23-Aug-2022 0701 - 06/08 0700 In: 3455.4 [P.O.:690; I.V.:1085.4; IV Piggyback:800; TPN:880] Out: -  Intake/Output this shift: Total I/O In: 240 [P.O.:240] Out: -   General appearance: alert, cooperative, appears stated age and morbidly obese Resp: clear to auscultation bilaterally Cardio: regular rate and rhythm, S1, S2 normal, no murmur, click, rub or gallop GI: soft, morbidly obese, non-tender; bowel sounds normal; no masses,  no organomegaly Extremities: extremities normal, atraumatic, no cyanosis or edema  Lab Results:  Recent Labs  08/10/13 0407  WBC 18.4*  HGB 11.2*  HCT 36.6*  PLT 426*   BMET  Recent Labs  08-22-13 0447 08/10/13 0407  NA 137 137  K 4.4 4.2  CL 100 100  CO2 25 26  GLUCOSE 244* 216*  BUN 24* 24*  CREATININE 1.09 1.08  CALCIUM 8.6 8.6   LFT  Recent Labs  08/10/13 0407  PROT 6.3  ALBUMIN 2.9*  AST 27  ALT 28  ALKPHOS 84  BILITOT 0.2*   PT/INR  Recent Labs  08/07/13 1800  LABPROT 13.5  INR 1.05   Studies/Results: Dg Abd 2 Views  22-Aug-2013   CLINICAL DATA:  Followup evaluation of small bowel obstruction.  EXAM: ABDOMEN - 2 VIEW  COMPARISON:  08/07/2013.  FINDINGS: Previously noted nasogastric tube has been removed. Surgical clips project over the right upper quadrant of the abdomen, compatible with prior cholecystectomy. Gas is noted throughout the colon. No distal rectal gas is identified at this time. No pathologic distention of small bowel. No  pneumoperitoneum.  IMPRESSION: 1. Nonspecific, nonobstructive bowel gas pattern, as above. 2. No pneumoperitoneum.   Electronically Signed   By: Vinnie Langton M.D.   On: Aug 22, 2013 12:13   Medications: I have reviewed the patient's current medications.  Assessment/Plan: Recurrent small bowel obstruction secondary to Crohn's-impoved with steroids: surgery planned for tomorrow. He will need immune modulators and/or biologics after surgery. Continue present treatment regimen.   LOS: 7 days   Juanita Craver 08/10/2013, 5:25 PM

## 2013-08-10 NOTE — Progress Notes (Addendum)
PARENTERAL NUTRITION CONSULT NOTE - FOLLOW UP  Pharmacy Consult:  TPN Indication: Crohn's ileitis / SBO  Allergies  Allergen Reactions  . Coconut Flavor Other (See Comments)    Does not eat because of crohn's     Patient Measurements: Height: 6' (182.9 cm) Weight: 249 lb 12.5 oz (113.3 kg) IBW/kg (Calculated) : 77.6  Vital Signs: Temp: 98.1 F (36.7 C) (06/08 0542) BP: 115/80 mmHg (06/08 0542) Pulse Rate: 68 (06/08 0542) Intake/Output from previous day: 06/07 0701 - 06/08 0700 In: 3455.4 [P.O.:690; I.V.:1085.4; IV Piggyback:800; TPN:880] Out: -   Labs:  Recent Labs  08/07/13 1800 08/10/13 0407  WBC  --  18.4*  HGB  --  11.2*  HCT  --  36.6*  PLT  --  426*  INR 1.05  --      Recent Labs  08/09/13 0447 08/10/13 0407  NA 137 137  K 4.4 4.2  CL 100 100  CO2 25 26  GLUCOSE 244* 216*  BUN 24* 24*  CREATININE 1.09 1.08  CALCIUM 8.6 8.6  MG  --  2.5  PHOS  --  3.4  PROT  --  6.3  ALBUMIN  --  2.9*  AST  --  27  ALT  --  28  ALKPHOS  --  84  BILITOT  --  0.2*  TRIG  --  64   Estimated Creatinine Clearance: 89.8 ml/min (by C-G formula based on Cr of 1.08).    Recent Labs  08/09/13 1729 08/10/13 0002 08/10/13 0604  GLUCAP 173* 202* 206*   Insulin Requirements in the past 24 hours:  13 units Novolog SSI and 40 units regular insulin in TPN  Current Nutrition:  Clinimix E 5/15 at 100 ml/hr and 20% IVFE at 10 ml/hr Clear liquid diet but made NPO 6/7 p.m. (prior to this, pt taking clear liquids well)  Nutritional Goals:  2100-2300 kCal,105-120 grams of protein per day  Assessment:  [name] with history of bowel obstruction a few weeks ago presented on 08/03/13 with complaints of severe abdominal cramping, abdominal distention, and vomiting that started several hours prior to arrival. He has a history of Crohn's disease, obesity, and multiple abdominal surgeries with adhesions and recurrent small bowel obstructions. Last admission was in April of 2015  for pSBO.  Pharmacy managing TPN for nutritional support.  GI: Crohn's ileitis / SBO / unspecified constipation / obesity.  GI started steroids for significant underlying fibrosis.  6/2 CT: consistent with Crohn's flare, likely has a chronic underlying stricture.  Prealbumin WNL at 20.6.  SBO improving. MD restarting clear liquids which pt was tolerating but will not advance diet due to resection of recurrent stricture this week - possibly tomorrow. TG 64.  Endo: hypothyroid on IV Synthroid, TSH WNL.  No hx DM - CBGs 173-206 on SSI and 40 units regular insulin in the TPN - Increase in CBGs likely related to clear liquid po intake  Lytes: wnl  Renal: CKD3 / BPH - SCr 1.08 - UOP not charted accurately, NS at 76m/hr  Pulm: OSA - stable on RA  Cards: HTN - VSS, PRN hydralazine  Hepatobil: LFTs WNL  ID: Cipro/Flagyl D#7 (6/2 >> ) for GI coverage.  Afebrile, WBC WNL.  UCx negative.  Best Practices: SQ heparin  TPN Access: PICC placed 6/3  TPN day#: 5 (6/3 >> )  Plan:  - Decrease Clinimix E 5/15 to 83 ml/hr and continue 20% IVFE at 10 ml/hr.  This will provide 1900 kCal and  100 gm of protein daily. TPN plus po intake should be meeting 100% of pt's estimated needs. - Daily multivitamin and trace elements - Continue SSI moderate Q6H. Will change regular insulin in TPN to 35 units (this will increase amount of insulin pt is receiving in relation to amount of dextrose) - TPN labs in AM - F/u surgery plans  Sherlon Handing, PharmD, BCPS Clinical pharmacist, pager [phone]  08/10/2013, 10:24 AM

## 2013-08-10 NOTE — Progress Notes (Addendum)
[facility] PROGRESS NOTE  Bruce [name] JOI:[id number] DOB: 02/12/1951 DOA: 08/03/2013 PCP: [name], MD  Brief narrative 63 y.o. year old male with prior hx/o crohns disease (s/p multiple bowel surgeries), HTN who presented with abdominal pain. Pt stated that he had severe sudden onset of abdominal pain, non bloody emesis and diarrhea while eating earlier today. Has also had severe generalized abd pain. Recently admission 1 month prior for high grade SBO. Surgery was consulted and an NGT was placed. Surgery recommended non operative management. Pt/wife felt that pain was significantly worse than previous admission, though pain much improved s/p NGT in ER. In the ER he was found ti have tachycardia with rates in the 120s. WBC 14.5. Hgb 12.4. Cr 1.34. KUB w/ high grade SBO. Lactate 2.3. Lipase WNL. Patient admitted to stepdown.  Assessment/Plan: Crohn's ileitis /Small bowel obstruction  -cont abx ( cipro and flagyl) -cont Budesonide  -GI suspects he has a combination of acute Crohn's exacerbation in setting of fibrostenosing disease that likely will not respond to med rxn alone and will need resection  -PICC placed and beganTNA . SBO improved on x ray. NG tube removed and started on clear liquids.  -plan on surgery tomorrow. -Dr. [name] is his GI  in [place]. Dr.Hung suggests post op pt will require anti-TNF or immunomodulator therapy to reduce the chance of recurrence at the anastamosis.   Metabolic acidosis  -resolved   Acute respiratory failure with hypoxia/Sleep apnea  Resolved. -likely from mechanical issues from recent distended abdomen and SBO  -cont O2  -CPAP per respiratory. pt refused saying does not use at home and actually gave away his machine.  Dehydration/tachycardia  Off  fluids  Hypertension  -BP stable -prn Hydralazine   Hypothyroidism  -continue IV Synthroid  Leukocytosis Secondary to the Solu-Medrol.    CKD (chronic kidney disease), stage III    DVT prophylaxis:  Subcutaneous heparin    Antibiotics:  Cipro 6/2 >>>  Flagyl 6/2 >>>      Code Status: full code Family Communication: none at bedside Disposition Plan: home  After surgery   Consultants:  [facility]   Dr Benson Norway  Procedures:  none  Antibiotics:  cipro and flagyl since 6/2  HPI/Subjective: Denies abdominal pain. No overnight issues  Objective: Filed Vitals:   08/10/13 1324  BP: 141/78  Pulse: 88  Temp: 96.1 F (35.6 C)  Resp: 18    Intake/Output Summary (Last 24 hours) at 08/10/13 1421 Last data filed at 08/10/13 1022  Gross per 24 hour  Intake   2320 ml  Output      0 ml  Net   2320 ml   Filed Weights   08/04/13 0230 08/05/13 1128 08/07/13 0500  Weight: 116.3 kg (256 lb 6.3 oz) 104.327 kg (230 lb) 113.3 kg (249 lb 12.5 oz)    Exam:   General:  Elderly male in NAD  HEENT: NG in place, moist mucosa  Chest: clear , no added sounds   CVS: N S1&S2, no MRG  Abd: soft, nondistended, minimal  right lower quadrant tenderness, bowel sounds present  Ext: warm, no edema  CNS: AAOX3   Data Reviewed: Basic Metabolic Panel:  Recent Labs Lab 08/05/13 0245 08/06/13 0450 08/07/13 0522 08/09/13 0447 08/10/13 0407  NA 138 139 140 137 137  K 4.6 4.0 4.0 4.4 4.2  CL 104 103 104 100 100  CO2 23 22 24 25 26   GLUCOSE 178* 146* 151* 244* 216*  BUN 21 19 21  24* 24*  CREATININE 1.07 0.99 1.02 1.09 1.08  CALCIUM 8.2* 8.5 8.8 8.6 8.6  MG  --  2.1 2.3  --  2.5  PHOS  --  2.3 3.0  --  3.4   Liver Function Tests:  Recent Labs Lab 08/03/13 2258 08/04/13 0550 08/05/13 0245 08/06/13 0450 08/10/13 0407  AST 24 26 19 18 27   ALT 22 25 17 15 28   ALKPHOS 112 100 82 93 84  BILITOT 0.4 0.3 0.3 0.2* 0.2*  PROT 7.7 7.1 6.2 6.4 6.3  ALBUMIN 3.8 3.3* 2.8* 2.9* 2.9*    Recent Labs Lab 08/03/13 2258  LIPASE 47   No results found for this basename: AMMONIA,  in the last 168 hours CBC:  Recent Labs Lab 08/03/13 2258 08/04/13 0156  08/04/13 0550 08/05/13 0245 08/10/13 0407  WBC 14.5* 17.2* 16.5* 8.5 18.4*  NEUTROABS 11.8*  --  14.6*  --  14.3*  HGB 12.4* 13.2 12.1* 10.1* 11.2*  HCT 38.7* 41.3 38.4* 33.4* 36.6*  MCV 79.6 80.0 79.8 82.1 80.1  PLT 450* 470* 444* 381 426*   Cardiac Enzymes:  Recent Labs Lab 08/04/13 0156 08/04/13 0550 08/04/13 1335  TROPONINI <0.30 <0.30 <0.30   BNP (last 3 results) No results found for this basename: PROBNP,  in the last 8760 hours CBG:  Recent Labs Lab 08/09/13 1217 08/09/13 1729 08/10/13 0002 08/10/13 0604 08/10/13 1158  GLUCAP 205* 173* 202* 206* 234*    Recent Results (from the past 240 hour(s))  MRSA PCR SCREENING     Status: None   Collection Time    08/04/13  2:26 AM      Result Value Ref Range Status   MRSA by PCR NEGATIVE  NEGATIVE Final   Comment:            The GeneXpert MRSA Assay (FDA     approved for NASAL specimens     only), is one component of a     comprehensive MRSA colonization     surveillance program. It is not     intended to diagnose MRSA     infection nor to guide or     monitor treatment for     MRSA infections.  URINE CULTURE     Status: None   Collection Time    08/04/13  6:31 PM      Result Value Ref Range Status   Specimen Description URINE, RANDOM   Final   Special Requests NONE   Final   Culture  Setup Time     Final   Value: 08/04/2013 19:34     Performed at [facility]     Final   Value: NO GROWTH     Performed at [facility]   Culture     Final   Value: NO GROWTH     Performed at [facility]   Report Status 08/05/2013 FINAL   Final  SURGICAL PCR SCREEN     Status: None   Collection Time    08/10/13 12:29 AM      Result Value Ref Range Status   MRSA, PCR NEGATIVE  NEGATIVE Final   Staphylococcus aureus NEGATIVE  NEGATIVE Final   Comment:            The Xpert SA Assay (FDA     approved for NASAL specimens     in patients over 74 years of age),     is one component  of  a comprehensive surveillance     program.  Test performance has     been validated by [facility] for patients greater     than or equal to 45 year old.     It is not intended     to diagnose infection nor to     guide or monitor treatment.     Studies: Dg Abd 2 Views  08/09/2013   CLINICAL DATA:  Followup evaluation of small bowel obstruction.  EXAM: ABDOMEN - 2 VIEW  COMPARISON:  08/07/2013.  FINDINGS: Previously noted nasogastric tube has been removed. Surgical clips project over the right upper quadrant of the abdomen, compatible with prior cholecystectomy. Gas is noted throughout the colon. No distal rectal gas is identified at this time. No pathologic distention of small bowel. No pneumoperitoneum.  IMPRESSION: 1. Nonspecific, nonobstructive bowel gas pattern, as above. 2. No pneumoperitoneum.   Electronically Signed   By: Vinnie Langton M.D.   On: 08/09/2013 12:13    Scheduled Meds: . ciprofloxacin  400 mg Intravenous Q12H  . heparin  5,000 Units Subcutaneous 3 times per day  . insulin aspart  0-15 Units Subcutaneous 4 times per day  . levothyroxine  87.5 mcg Intravenous Daily  . methylPREDNISolone (SOLU-MEDROL) injection  60 mg Intravenous Q12H  . metronidazole  500 mg Intravenous 4 times per day  . sodium chloride  3 mL Intravenous Q12H   Continuous Infusions: . sodium chloride 20 mL/hr at 08/08/13 1900  . Marland KitchenTPN (CLINIMIX-E) Adult 100 mL/hr at 08/09/13 1850   And  . fat emulsion 250 mL (08/09/13 1849)  . Marland KitchenTPN (CLINIMIX-E) Adult     And  . fat emulsion        Time spent: 25 minutes    Sharalyn Lomba  [facility] Pager [phone] If 7PM-7AM, please contact night-coverage at [email or web address], password [facility] 08/10/2013, 2:21 PM  LOS: 7 days

## 2013-08-10 NOTE — Progress Notes (Signed)
Discussed with patient and family.  Offered continued medical management but this is second bout of obstruction in 2 months and has defined stricture.  Surgical resection on steroids caries 2 to 3 fold increase risk of complication but wife doesn't want this to recur. They wish to proceed with resection given increased risk. The procedure has been discussed with the patient.  Alternative therapies have been discussed with the patient.  Operative risks include bleeding,  Infection,  Organ injury,  Nerve injury,  Blood vessel injury,  anastomtic breakdown,  Death,  And possible reoperation.  Medical management risks include worsening of present situation.  The success of the procedure is 50 -90 % at treating patients symptoms.  The patient understands and agrees to proceed.

## 2013-08-11 ENCOUNTER — Inpatient Hospital Stay (HOSPITAL_COMMUNITY): Payer: 59 | Admitting: Certified Registered"

## 2013-08-11 ENCOUNTER — Encounter (HOSPITAL_COMMUNITY): Payer: Self-pay | Admitting: Certified Registered"

## 2013-08-11 ENCOUNTER — Encounter (HOSPITAL_COMMUNITY)
Admission: EM | Disposition: A | Discharge status: Home / Self Care | Payer: Self-pay | Source: Home / Self Care | Attending: Internal Medicine

## 2013-08-11 ENCOUNTER — Encounter (HOSPITAL_COMMUNITY): Payer: 59 | Admitting: Certified Registered"

## 2013-08-11 HISTORY — PX: LAPAROTOMY: SHX154

## 2013-08-11 HISTORY — PX: BOWEL RESECTION: SHX1257

## 2013-08-11 LAB — ABO/RH: ABO/RH(D): O POS

## 2013-08-11 LAB — TYPE AND SCREEN
ABO/RH(D): O POS
Antibody Screen: NEGATIVE

## 2013-08-11 LAB — GLUCOSE, CAPILLARY
Glucose-Capillary: 157 mg/dL — ABNORMAL HIGH (ref 70–99)
Glucose-Capillary: 162 mg/dL — ABNORMAL HIGH (ref 70–99)
Glucose-Capillary: 219 mg/dL — ABNORMAL HIGH (ref 70–99)
Glucose-Capillary: 198 mg/dL — ABNORMAL HIGH (ref 70–99)
Glucose-Capillary: 201 mg/dL — ABNORMAL HIGH (ref 70–99)

## 2013-08-11 LAB — BASIC METABOLIC PANEL
BUN: 23 mg/dL (ref 6–23)
CO2: 23 mEq/L (ref 19–32)
Calcium: 8.4 mg/dL (ref 8.4–10.5)
Chloride: 98 mEq/L (ref 96–112)
Creatinine, Ser: 1.04 mg/dL (ref 0.50–1.35)
GFR calc Af Amer: 86 mL/min — ABNORMAL LOW (ref >90)
GFR calc non Af Amer: 74 mL/min — ABNORMAL LOW (ref >90)
Glucose, Bld: 166 mg/dL — ABNORMAL HIGH (ref 70–99)
Potassium: 4.3 mEq/L (ref 3.7–5.3)
Sodium: 135 mEq/L — ABNORMAL LOW (ref 137–147)

## 2013-08-11 LAB — CBC
HCT: 35.2 % — ABNORMAL LOW (ref 39.0–52.0)
Hemoglobin: 10.8 g/dL — ABNORMAL LOW (ref 13.0–17.0)
MCH: 24.3 pg — ABNORMAL LOW (ref 26.0–34.0)
MCHC: 30.7 g/dL (ref 30.0–36.0)
MCV: 79.1 fL (ref 78.0–100.0)
Platelets: 387 10*3/uL (ref 150–400)
RBC: 4.45 MIL/uL (ref 4.22–5.81)
RDW: 16.2 % — ABNORMAL HIGH (ref 11.5–15.5)
WBC: 22.6 10*3/uL — ABNORMAL HIGH (ref 4.0–10.5)

## 2013-08-11 SURGERY — LAPAROTOMY, EXPLORATORY
Anesthesia: General | Site: Abdomen

## 2013-08-11 MED ORDER — OXYCODONE HCL 5 MG/5ML PO SOLN: 5.0000 mg | Freq: Once | ORAL | Status: DC | PRN

## 2013-08-11 MED ORDER — NEOSTIGMINE METHYLSULFATE 10 MG/10ML IV SOLN
INTRAVENOUS | Status: AC
  Filled 2013-08-11: qty 1

## 2013-08-11 MED ORDER — NALOXONE HCL 0.4 MG/ML IJ SOLN: 0.4000 mg | INTRAMUSCULAR | Status: DC | PRN

## 2013-08-11 MED ORDER — PROPOFOL 10 MG/ML IV BOLUS
INTRAVENOUS | Status: AC
  Filled 2013-08-11: qty 20

## 2013-08-11 MED ORDER — ROCURONIUM BROMIDE 50 MG/5ML IV SOLN
INTRAVENOUS | Status: AC
  Filled 2013-08-11: qty 1

## 2013-08-11 MED ORDER — SUFENTANIL CITRATE 50 MCG/ML IV SOLN
INTRAVENOUS | Status: AC
  Filled 2013-08-11: qty 1

## 2013-08-11 MED ORDER — GLYCOPYRROLATE 0.2 MG/ML IJ SOLN
INTRAMUSCULAR | Status: AC
  Filled 2013-08-11: qty 2

## 2013-08-11 MED ORDER — MIDAZOLAM HCL 5 MG/5ML IJ SOLN
INTRAMUSCULAR | Status: DC | PRN
  Administered 2013-08-11: 2 mg via INTRAVENOUS

## 2013-08-11 MED ORDER — ACETAMINOPHEN 325 MG PO TABS: 325.0000 mg | ORAL_TABLET | ORAL | Status: DC | PRN

## 2013-08-11 MED ORDER — HYDROMORPHONE 0.3 MG/ML IV SOLN
INTRAVENOUS | Status: DC
  Administered 2013-08-11: 0.799 mg via INTRAVENOUS
  Administered 2013-08-11: 13:00:00 via INTRAVENOUS
  Administered 2013-08-12: 0.4 mg via INTRAVENOUS
  Administered 2013-08-12: 0.6 mg via INTRAVENOUS
  Administered 2013-08-12: 0.2 mg via INTRAVENOUS

## 2013-08-11 MED ORDER — 0.9 % SODIUM CHLORIDE (POUR BTL) OPTIME
TOPICAL | Status: DC | PRN
  Administered 2013-08-11 (×2): 1000 mL

## 2013-08-11 MED ORDER — DUTASTERIDE 0.5 MG PO CAPS
0.5000 mg | ORAL_CAPSULE | Freq: Every day | ORAL | Status: DC
  Administered 2013-08-12 – 2013-08-18 (×7): 0.5 mg via ORAL
  Filled 2013-08-11 (×8): qty 1

## 2013-08-11 MED ORDER — MIDAZOLAM HCL 2 MG/2ML IJ SOLN
INTRAMUSCULAR | Status: AC
  Filled 2013-08-11: qty 2

## 2013-08-11 MED ORDER — SODIUM CHLORIDE 0.9 % IJ SOLN
INTRAMUSCULAR | Status: AC
  Filled 2013-08-11: qty 10

## 2013-08-11 MED ORDER — PROMETHAZINE HCL 25 MG/ML IJ SOLN
6.2500 mg | INTRAMUSCULAR | Status: DC | PRN
Start: 2013-08-11 — End: 2013-08-11

## 2013-08-11 MED ORDER — SUFENTANIL CITRATE 50 MCG/ML IV SOLN
INTRAVENOUS | Status: DC | PRN
  Administered 2013-08-11: 10 ug via INTRAVENOUS
  Administered 2013-08-11: 20 ug via INTRAVENOUS
  Administered 2013-08-11 (×4): 10 ug via INTRAVENOUS

## 2013-08-11 MED ORDER — HYDROMORPHONE HCL PF 1 MG/ML IJ SOLN
0.2500 mg | INTRAMUSCULAR | Status: DC | PRN
  Administered 2013-08-11: 0.5 mg via INTRAVENOUS

## 2013-08-11 MED ORDER — PROPOFOL 10 MG/ML IV BOLUS
INTRAVENOUS | Status: DC | PRN
  Administered 2013-08-11 (×2): 30 mg via INTRAVENOUS
  Administered 2013-08-11: 140 mg via INTRAVENOUS

## 2013-08-11 MED ORDER — DIPHENHYDRAMINE HCL 12.5 MG/5ML PO ELIX: 12.5000 mg | ORAL_SOLUTION | Freq: Four times a day (QID) | ORAL | Status: DC | PRN

## 2013-08-11 MED ORDER — ONDANSETRON HCL 4 MG/2ML IJ SOLN
INTRAMUSCULAR | Status: AC
  Filled 2013-08-11: qty 2

## 2013-08-11 MED ORDER — ONDANSETRON HCL 4 MG/2ML IJ SOLN
INTRAMUSCULAR | Status: DC | PRN
  Administered 2013-08-11: 4 mg via INTRAVENOUS

## 2013-08-11 MED ORDER — LISINOPRIL 10 MG PO TABS
10.0000 mg | ORAL_TABLET | Freq: Every day | ORAL | Status: DC
  Administered 2013-08-12 – 2013-08-17 (×6): 10 mg via ORAL
  Filled 2013-08-11 (×8): qty 1

## 2013-08-11 MED ORDER — HYDROMORPHONE HCL PF 1 MG/ML IJ SOLN
INTRAMUSCULAR | Status: AC
  Filled 2013-08-11: qty 1

## 2013-08-11 MED ORDER — MESALAMINE ER 250 MG PO CPCR
1000.0000 mg | ORAL_CAPSULE | Freq: Four times a day (QID) | ORAL | Status: DC
  Administered 2013-08-11 – 2013-08-12 (×3): 1000 mg via ORAL
  Filled 2013-08-11 (×5): qty 4

## 2013-08-11 MED ORDER — ACETAMINOPHEN 160 MG/5ML PO SOLN
325.0000 mg | ORAL | Status: DC | PRN
  Filled 2013-08-11: qty 20.3

## 2013-08-11 MED ORDER — INSULIN ASPART 100 UNIT/ML ~~LOC~~ SOLN
SUBCUTANEOUS | Status: AC
  Filled 2013-08-11: qty 5

## 2013-08-11 MED ORDER — TRACE MINERALS CR-CU-F-FE-I-MN-MO-SE-ZN IV SOLN
INTRAVENOUS | Status: AC
  Administered 2013-08-11: 20:00:00 via INTRAVENOUS
  Administered 2013-08-11: 83 mL/h via INTRAVENOUS
  Filled 2013-08-11: qty 3000

## 2013-08-11 MED ORDER — DIPHENHYDRAMINE HCL 50 MG/ML IJ SOLN: 12.5000 mg | Freq: Four times a day (QID) | INTRAMUSCULAR | Status: DC | PRN

## 2013-08-11 MED ORDER — NEOSTIGMINE METHYLSULFATE 10 MG/10ML IV SOLN
INTRAVENOUS | Status: DC | PRN
  Administered 2013-08-11: 3 mg via INTRAVENOUS

## 2013-08-11 MED ORDER — OXYCODONE HCL 5 MG PO TABS: 5.0000 mg | ORAL_TABLET | Freq: Once | ORAL | Status: DC | PRN

## 2013-08-11 MED ORDER — LACTATED RINGERS IV SOLN
INTRAVENOUS | Status: DC | PRN
  Administered 2013-08-11 (×2): via INTRAVENOUS

## 2013-08-11 MED ORDER — ROCURONIUM BROMIDE 100 MG/10ML IV SOLN
INTRAVENOUS | Status: DC | PRN
  Administered 2013-08-11: 50 mg via INTRAVENOUS
  Administered 2013-08-11 (×2): 10 mg via INTRAVENOUS

## 2013-08-11 MED ORDER — SODIUM CHLORIDE 0.9 % IV SOLN
INTRAVENOUS | Status: AC
  Administered 2013-08-11 – 2013-08-12 (×2): via INTRAVENOUS

## 2013-08-11 MED ORDER — GLYCOPYRROLATE 0.2 MG/ML IJ SOLN
INTRAMUSCULAR | Status: DC | PRN
  Administered 2013-08-11: 0.4 mg via INTRAVENOUS

## 2013-08-11 MED ORDER — HYDROMORPHONE 0.3 MG/ML IV SOLN
INTRAVENOUS | Status: AC
  Filled 2013-08-11: qty 25

## 2013-08-11 MED ORDER — ONDANSETRON HCL 4 MG/2ML IJ SOLN: 4.0000 mg | Freq: Four times a day (QID) | INTRAMUSCULAR | Status: DC | PRN

## 2013-08-11 MED ORDER — SODIUM CHLORIDE 0.9 % IJ SOLN: 9.0000 mL | INTRAMUSCULAR | Status: DC | PRN

## 2013-08-11 MED ORDER — FAT EMULSION 20 % IV EMUL
240.0000 mL | INTRAVENOUS | Status: AC
  Administered 2013-08-11: 240 mL via INTRAVENOUS
  Filled 2013-08-11: qty 250

## 2013-08-11 SURGICAL SUPPLY — 56 items
BLADE SURG ROTATE 9660 (MISCELLANEOUS) ×1 IMPLANT
CANISTER SUCTION 2500CC (MISCELLANEOUS) ×2 IMPLANT
CHLORAPREP W/TINT 26ML (MISCELLANEOUS) ×2 IMPLANT
COVER MAYO STAND STRL (DRAPES) IMPLANT
COVER SURGICAL LIGHT HANDLE (MISCELLANEOUS) ×2 IMPLANT
DRAPE LAPAROSCOPIC ABDOMINAL (DRAPES) ×2 IMPLANT
DRAPE PROXIMA HALF (DRAPES) ×1 IMPLANT
DRAPE UTILITY 15X26 W/TAPE STR (DRAPE) ×4 IMPLANT
DRAPE WARM FLUID 44X44 (DRAPE) ×2 IMPLANT
DRSG OPSITE POSTOP 4X10 (GAUZE/BANDAGES/DRESSINGS) IMPLANT
DRSG OPSITE POSTOP 4X8 (GAUZE/BANDAGES/DRESSINGS) IMPLANT
ELECT BLADE 6.5 EXT (BLADE) ×1 IMPLANT
ELECT CAUTERY BLADE 6.4 (BLADE) ×4 IMPLANT
ELECT REM PT RETURN 9FT ADLT (ELECTROSURGICAL) ×2
ELECTRODE REM PT RTRN 9FT ADLT (ELECTROSURGICAL) ×1 IMPLANT
GLOVE BIO SURGEON STRL SZ7.5 (GLOVE) ×3 IMPLANT
GLOVE BIO SURGEON STRL SZ8 (GLOVE) ×2 IMPLANT
GLOVE BIOGEL PI IND STRL 7.0 (GLOVE) IMPLANT
GLOVE BIOGEL PI IND STRL 7.5 (GLOVE) IMPLANT
GLOVE BIOGEL PI IND STRL 8 (GLOVE) ×1 IMPLANT
GLOVE BIOGEL PI INDICATOR 7.0 (GLOVE) ×2
GLOVE BIOGEL PI INDICATOR 7.5 (GLOVE) ×1
GLOVE BIOGEL PI INDICATOR 8 (GLOVE) ×1
GLOVE SURG SS PI 7.0 STRL IVOR (GLOVE) ×4 IMPLANT
GOWN STRL REUS W/ TWL LRG LVL3 (GOWN DISPOSABLE) ×2 IMPLANT
GOWN STRL REUS W/ TWL XL LVL3 (GOWN DISPOSABLE) ×1 IMPLANT
GOWN STRL REUS W/TWL LRG LVL3 (GOWN DISPOSABLE) ×6
GOWN STRL REUS W/TWL XL LVL3 (GOWN DISPOSABLE) ×2
KIT BASIN OR (CUSTOM PROCEDURE TRAY) ×2 IMPLANT
KIT ROOM TURNOVER OR (KITS) ×2 IMPLANT
LIGASURE IMPACT 36 18CM CVD LR (INSTRUMENTS) ×1 IMPLANT
NS IRRIG 1000ML POUR BTL (IV SOLUTION) ×4 IMPLANT
PACK GENERAL/GYN (CUSTOM PROCEDURE TRAY) ×2 IMPLANT
PAD ABD 8X10 STRL (GAUZE/BANDAGES/DRESSINGS) ×1 IMPLANT
PAD ARMBOARD 7.5X6 YLW CONV (MISCELLANEOUS) ×2 IMPLANT
PENCIL BUTTON HOLSTER BLD 10FT (ELECTRODE) ×1 IMPLANT
RELOAD PROXIMATE 75MM BLUE (ENDOMECHANICALS) ×4 IMPLANT
RELOAD STAPLE 75 3.8 BLU REG (ENDOMECHANICALS) IMPLANT
SPECIMEN JAR LARGE (MISCELLANEOUS) ×1 IMPLANT
SPONGE GAUZE 4X4 12PLY (GAUZE/BANDAGES/DRESSINGS) ×1 IMPLANT
SPONGE LAP 18X18 X RAY DECT (DISPOSABLE) ×2 IMPLANT
STAPLER GUN LINEAR PROX 60 (STAPLE) ×1 IMPLANT
STAPLER PROXIMATE 75MM BLUE (STAPLE) ×1 IMPLANT
STAPLER VISISTAT 35W (STAPLE) ×3 IMPLANT
SUCTION POOLE TIP (SUCTIONS) ×2 IMPLANT
SUT PDS AB 1 TP1 96 (SUTURE) ×2 IMPLANT
SUT VIC AB 2-0 SH 18 (SUTURE) ×3 IMPLANT
SUT VIC AB 3-0 SH 18 (SUTURE) ×2 IMPLANT
SUT VICRYL AB 2 0 TIES (SUTURE) ×2 IMPLANT
SUT VICRYL AB 3 0 TIES (SUTURE) ×2 IMPLANT
TAPE CLOTH SURG 6X10 WHT LF (GAUZE/BANDAGES/DRESSINGS) ×1 IMPLANT
TOWEL OR 17X24 6PK STRL BLUE (TOWEL DISPOSABLE) ×2 IMPLANT
TOWEL OR 17X26 10 PK STRL BLUE (TOWEL DISPOSABLE) ×2 IMPLANT
TRAY FOLEY CATH 16FRSI W/METER (SET/KITS/TRAYS/PACK) ×1 IMPLANT
TUBE CONNECTING 12X1/4 (SUCTIONS) IMPLANT
YANKAUER SUCT BULB TIP NO VENT (SUCTIONS) ×1 IMPLANT

## 2013-08-11 NOTE — Transfer of Care (Signed)
Immediate Anesthesia Transfer of Care Note  Patient: Bruce [name]  Procedure(s) Performed: Procedure(s): EXPLORATORY LAPAROTOMY  (N/A) SMALL BOWEL RESECTION (N/A)  Patient Location: PACU  Anesthesia Type:General  Level of Consciousness: awake  Airway & Oxygen Therapy: Patient Spontanous Breathing and Patient connected to face mask  Post-op Assessment: Report given to PACU RN, Post -op Vital signs reviewed and stable and Patient moving all extremities  Post vital signs: Reviewed and stable  Complications: No apparent anesthesia complications

## 2013-08-11 NOTE — Brief Op Note (Signed)
08/03/2013 - 08/11/2013  11:53 AM  PATIENT:  Bruce [name]  63 y.o. male  PRE-OPERATIVE DIAGNOSIS:  STRICTURE  POST-OPERATIVE DIAGNOSIS:  STRICTURE  PROCEDURE:  Procedure(s): EXPLORATORY LAPAROTOMY  (N/A) SMALL BOWEL RESECTION (N/A)  SURGEON:  Surgeon(s) and Role:    * Leray Garverick A. Elizabelle Fite, MD - Primary    ASSISTANTS: none   ANESTHESIA:   general  EBL:  Total I/O In: 1000 [I.V.:1000] Out: 225 [Urine:150; Blood:75]  BLOOD ADMINISTERED:none  DRAINS: none   LOCAL MEDICATIONS USED:  NONE  SPECIMEN:  Source of Specimen:  terminal ileum and cecum  DISPOSITION OF SPECIMEN:  PATHOLOGY  COUNTS:  YES  TOURNIQUET:  * No tourniquets in log *  DICTATION: .Other Dictation: Dictation Number  [id number]  PLAN OF CARE: Admit to inpatient   PATIENT DISPOSITION:  PACU - hemodynamically stable.   Delay start of Pharmacological VTE agent (>24hrs) due to surgical blood loss or risk of bleeding: no

## 2013-08-11 NOTE — Progress Notes (Addendum)
[facility] PROGRESS NOTE  Bruce [name] IHK:[id number] DOB: 1950-05-14 DOA: 08/03/2013 PCP: [name], MD  Brief narrative 63 y.o. year old male with prior hx/o crohns disease (s/p multiple bowel surgeries), HTN who presented with abdominal pain. Pt stated that he had severe sudden onset of abdominal pain, non bloody emesis and diarrhea while eating earlier today. Has also had severe generalized abd pain. Recently admission 1 month prior for high grade SBO. Surgery was consulted and an NGT was placed. Surgery recommended non operative management. Pt/wife felt that pain was significantly worse than previous admission, though pain much improved s/p NGT in ER. In the ER he was found ti have tachycardia with rates in the 120s. WBC 14.5. Hgb 12.4. Cr 1.34. KUB w/ high grade SBO. Lactate 2.3. Lipase WNL. Patient admitted to stepdown and transferred to floor once stable. .  Assessment/Plan: Crohn's ileitis /Small bowel obstruction  -cont abx ( cipro and flagyl) -continue  Budesonide  - he has a combination of acute Crohn's exacerbation in setting of fibrostenosing disease that likely will not respond to medication. GI recommended surgical resection. On IV solumedrol. -PICC placed and beganTNA . SBO improved on x ray. NG tube removed and started on clear liquids.  -patient taken to OR today with laparoscopic small bowel resection. tolerated surgery. Now on PCA and NG placed. -Dr. [name] is his GI  in [place]. Dr.Hung suggests post op pt will require anti-TNF or immunomodulator therapy to reduce the chance of recurrence at the anastamosis.   Metabolic acidosis  -resolved   Acute respiratory failure with hypoxia/Sleep apnea  Resolved. -likely from mechanical issues from recent distended abdomen and SBO  -cont O2  -CPAP per respiratory. pt refused saying does not use at home and actually gave away his machine.  Dehydration/tachycardia  Off  fluids  Hypertension  -BP stable -prn  Hydralazine   Hypothyroidism  -continue IV Synthroid  Leukocytosis Secondary to the Solu-Medrol.    CKD (chronic kidney disease), stage III   DVT prophylaxis:  Subcutaneous heparin    Antibiotics:  Cipro 6/2 >>>  Flagyl 6/2 >>>      Code Status: full code Family Communication: wife at bedside Disposition Plan: home  Over next few days depending upon improvement post op   Consultants:  [facility]   Dr Benson Norway  Procedures:  laparoscopic small bowel resection on 6/9  Antibiotics:  cipro and flagyl since 6/2  HPI/Subjective: Seen after returning from OR. Patient drowsy post op  Objective: Filed Vitals:   08/11/13 1454  BP: 113/80  Pulse: 111  Temp: 97.3 F (36.3 C)  Resp: 19    Intake/Output Summary (Last 24 hours) at 08/11/13 1621 Last data filed at 08/11/13 1415  Gross per 24 hour  Intake   3318 ml  Output    655 ml  Net   2663 ml   Filed Weights   08/05/13 1128 08/07/13 0500 08/11/13 0500  Weight: 104.327 kg (230 lb) 113.3 kg (249 lb 12.5 oz) 113.2 kg (249 lb 9 oz)    Exam:   General:  Elderly male appears drowsy   HEENT: NG in place, moist mucosa  Chest: clear , no added sounds   CVS: N S1&S2, no MRG  Abd: soft, dressing over surgical site intact.,   Ext: warm, no edema     Data Reviewed: Basic Metabolic Panel:  Recent Labs Lab 08/06/13 0450 08/07/13 0522 08/09/13 0447 08/10/13 0407 08/11/13 0450  NA 139 140 137 137 135*  K 4.0 4.0 4.4  4.2 4.3  CL 103 104 100 100 98  CO2 22 24 25 26 23   GLUCOSE 146* 151* 244* 216* 166*  BUN 19 21 24* 24* 23  CREATININE 0.99 1.02 1.09 1.08 1.04  CALCIUM 8.5 8.8 8.6 8.6 8.4  MG 2.1 2.3  --  2.5  --   PHOS 2.3 3.0  --  3.4  --    Liver Function Tests:  Recent Labs Lab 08/05/13 0245 08/06/13 0450 08/10/13 0407  AST 19 18 27   ALT 17 15 28   ALKPHOS 82 93 84  BILITOT 0.3 0.2* 0.2*  PROT 6.2 6.4 6.3  ALBUMIN 2.8* 2.9* 2.9*   No results found for this basename: LIPASE, AMYLASE,  in  the last 168 hours No results found for this basename: AMMONIA,  in the last 168 hours CBC:  Recent Labs Lab 08/05/13 0245 08/10/13 0407 08/11/13 0630  WBC 8.5 18.4* 22.6*  NEUTROABS  --  14.3*  --   HGB 10.1* 11.2* 10.8*  HCT 33.4* 36.6* 35.2*  MCV 82.1 80.1 79.1  PLT 381 426* 387   Cardiac Enzymes: No results found for this basename: CKTOTAL, CKMB, CKMBINDEX, TROPONINI,  in the last 168 hours BNP (last 3 results) No results found for this basename: PROBNP,  in the last 8760 hours CBG:  Recent Labs Lab 08/10/13 1759 08/10/13 2333 08/11/13 0606 08/11/13 0622 08/11/13 1221  GLUCAP 207* 203* 157* 162* 219*    Recent Results (from the past 240 hour(s))  MRSA PCR SCREENING     Status: None   Collection Time    08/04/13  2:26 AM      Result Value Ref Range Status   MRSA by PCR NEGATIVE  NEGATIVE Final   Comment:            The GeneXpert MRSA Assay (FDA     approved for NASAL specimens     only), is one component of a     comprehensive MRSA colonization     surveillance program. It is not     intended to diagnose MRSA     infection nor to guide or     monitor treatment for     MRSA infections.  URINE CULTURE     Status: None   Collection Time    08/04/13  6:31 PM      Result Value Ref Range Status   Specimen Description URINE, RANDOM   Final   Special Requests NONE   Final   Culture  Setup Time     Final   Value: 08/04/2013 19:34     Performed at [place]     Final   Value: NO GROWTH     Performed at [facility]   Culture     Final   Value: NO GROWTH     Performed at [facility]   Report Status 08/05/2013 FINAL   Final  SURGICAL PCR SCREEN     Status: None   Collection Time    08/10/13 12:29 AM      Result Value Ref Range Status   MRSA, PCR NEGATIVE  NEGATIVE Final   Staphylococcus aureus NEGATIVE  NEGATIVE Final   Comment:            The Xpert SA Assay (FDA     approved for NASAL specimens     in patients  over 64 years of age),     is one component of  a comprehensive surveillance     program.  Test performance has     been validated by [facility] for patients greater     than or equal to 81 year old.     It is not intended     to diagnose infection nor to     guide or monitor treatment.     Studies: No results found.  Scheduled Meds: . [facility] HOLD] ciprofloxacin  400 mg Intravenous Q12H  . dutasteride  0.5 mg Oral Daily  . [MAR HOLD] heparin  5,000 Units Subcutaneous 3 times per day  . HYDROmorphone      . HYDROmorphone PCA 0.3 mg/mL   Intravenous 6 times per day  . HYDROmorphone PCA 0.3 mg/mL      . insulin aspart      . [MAR HOLD] insulin aspart  0-15 Units Subcutaneous 4 times per day  . [facility] HOLD] levothyroxine  87.5 mcg Intravenous Daily  . lisinopril  10 mg Oral Daily  . mesalamine  1,000 mg Oral QID  . [MAR HOLD] methylPREDNISolone (SOLU-MEDROL) injection  60 mg Intravenous Q12H  . [facility] HOLD] metronidazole  500 mg Intravenous 4 times per day  . [facility] HOLD] sodium chloride  3 mL Intravenous Q12H   Continuous Infusions: . sodium chloride    . Marland KitchenTPN (CLINIMIX-E) Adult 83 mL/hr (08/11/13 0900)   And  . fat emulsion    . [MAR HOLD] .TPN (CLINIMIX-E) Adult 83 mL/hr at 08/10/13 1804   And  . [facility] HOLD] fat emulsion 250 mL (08/10/13 1803)      Time spent: 25 minutes    Cedar Roseman  [facility] Pager [phone] If 7PM-7AM, please contact night-coverage at [email or web address], password [facility] 08/11/2013, 4:21 PM  LOS: 8 days

## 2013-08-11 NOTE — Progress Notes (Signed)
PARENTERAL NUTRITION CONSULT NOTE - FOLLOW UP  Pharmacy Consult:  TPN Indication: Crohn's ileitis / SBO  Allergies  Allergen Reactions  . Coconut Flavor Other (See Comments)    Does not eat because of crohn's     Patient Measurements: Height: 6' (182.9 cm) Weight: 249 lb 9 oz (113.2 kg) IBW/kg (Calculated) : 77.6  Vital Signs: Temp: 97.9 F (36.6 C) (06/09 0523) Temp src: Oral (06/09 0523) BP: 118/76 mmHg (06/09 0523) Pulse Rate: 78 (06/09 0523) Intake/Output from previous day: 06/08 0701 - 06/09 0700 In: 1838 [P.O.:820; TPN:1018] Out: -   Labs:  Recent Labs  08/10/13 0407 08/11/13 0630  WBC 18.4* 22.6*  HGB 11.2* 10.8*  HCT 36.6* 35.2*  PLT 426* 387     Recent Labs  08/09/13 0447 08/10/13 0407 08/11/13 0450  NA 137 137 135*  K 4.4 4.2 4.3  CL 100 100 98  CO2 25 26 23   GLUCOSE 244* 216* 166*  BUN 24* 24* 23  CREATININE 1.09 1.08 1.04  CALCIUM 8.6 8.6 8.4  MG  --  2.5  --   PHOS  --  3.4  --   PROT  --  6.3  --   ALBUMIN  --  2.9*  --   AST  --  27  --   ALT  --  28  --   ALKPHOS  --  84  --   BILITOT  --  0.2*  --   PREALBUMIN  --  30.5  --   TRIG  --  64  --    Estimated Creatinine Clearance: 93.2 ml/min (by C-G formula based on Cr of 1.04).    Recent Labs  08/10/13 2333 08/11/13 0606 08/11/13 0622  GLUCAP 203* 157* 162*   Insulin Requirements in the past 24 hours:  16 units Novolog SSI and 35 units regular insulin in TPN  Current Nutrition:  Clinimix E 5/15 83 ml/hr and 20% IVFE at 10 ml/hr.  This provides 1900 kCal and 100 gm of protein daily. Clear liquid diet but stopped last night due to surgery today  Nutritional Goals:  2100-2300 kCal,105-120 grams of protein per day  Assessment:  61 YOM with history of bowel obstruction a few weeks ago presented on 08/03/13 with complaints of severe abdominal cramping, abdominal distention, and vomiting that started several hours prior to arrival. He has a history of Crohn's disease, obesity,  and multiple abdominal surgeries with adhesions and recurrent small bowel obstructions. Last admission was in April of 2015 for pSBO.  Pharmacy managing TPN for nutritional support.  GI: Crohn's ileitis / SBO / unspecified constipation / obesity.  GI started steroids for significant underlying fibrosis.  6/2 CT: consistent with Crohn's flare, likely has a chronic underlying stricture. Pt tolerated clear liquid diet but NPO last night as pt in OR today for exp/lap and SBR. TG 64. Prealbumin WNL at 30.5.   Endo: hypothyroid on IV Synthroid, TSH WNL.  No hx DM - CBGs 162-207 on SSI and 35 units regular insulin in the TPN.  Lytes: wnl except Na bit low  Renal: CKD3 / BPH - SCr 1.04 - UOP not charted accurately, NS at 84m/hr  Pulm: OSA - stable on RA  Cards: HTN - VSS, PRN hydralazine  Hepatobil: LFTs WNL  ID: Cipro/Flagyl D#8 (6/2 >> ) for GI coverage.  Afebrile, WBC WNL.  UCx negative.  Best Practices: SQ heparin  TPN Access: PICC placed 6/3  TPN day#: 6 (6/3 >> )  Plan:  -  Increase Clinimix E 5/15 back to goal rate of 162m/hr and continue 20% IVFE at 10 ml/hr.  This will provide 2184 kCal and 120 gm of protein daily.  - Daily multivitamin and trace elements - Continue SSI moderate Q6H. Will change regular insulin in TPN to 60 units per 3L bag (pt will receive about 50 units total) - TPN labs in AM  CSherlon Handing PharmD, BLu Vernepharmacist, pager [phone] 08/11/2013, 9:23 AM

## 2013-08-11 NOTE — Anesthesia Preprocedure Evaluation (Addendum)
Anesthesia Evaluation  Patient identified by MRN, date of birth, ID band Patient awake    Reviewed: Allergy & Precautions, H&P , NPO status , Patient's Chart, lab work & pertinent test results  History of Anesthesia Complications Negative for: history of anesthetic complications  Airway Mallampati: III TM Distance: >3 FB Neck ROM: Full    Dental  (+) Poor Dentition, Dental Advisory Given,    Pulmonary sleep apnea and Continuous Positive Airway Pressure Ventilation , neg COPDformer smoker,  Non-complaint cpap breath sounds clear to auscultation        Cardiovascular hypertension, Pt. on medications - angina+CHF - Past MI and - DOE - dysrhythmias - Cardiac Defibrillator Rhythm:Regular  Ef 45% on tte   Neuro/Psych negative neurological ROS  negative psych ROS   GI/Hepatic Neg liver ROS, Bowel obstruction   Endo/Other  Hypothyroidism Morbid obesity  Renal/GU Renal InsufficiencyRenal disease     Musculoskeletal   Abdominal   Peds  Hematology  (+) anemia , Glucose intolerance in setting of tpn   Anesthesia Other Findings   Reproductive/Obstetrics                          Anesthesia Physical Anesthesia Plan  ASA: III  Anesthesia Plan: General   Post-op Pain Management:    Induction: Intravenous  Airway Management Planned: Oral ETT  Additional Equipment: None  Intra-op Plan:   Post-operative Plan: Extubation in OR  Informed Consent: I have reviewed the patients History and Physical, chart, labs and discussed the procedure including the risks, benefits and alternatives for the proposed anesthesia with the patient or authorized representative who has indicated his/her understanding and acceptance.   Dental advisory given  Plan Discussed with: CRNA, Anesthesiologist and Surgeon  Anesthesia Plan Comments:        Anesthesia Quick Evaluation

## 2013-08-11 NOTE — H&P (View-Only) (Signed)
Subjective: He feels fine, he has had some loose stools, tolerating clears.  He was made NPO last PM.  I will put him back on clears, and Dr. Brantley Stage will discuss resection later.  Objective: Vital signs in last 24 hours: Temp:  [98.1 F (36.7 C)-98.7 F (37.1 C)] 98.1 F (36.7 C) (06/08 0542) Pulse Rate:  [68-73] 68 (06/08 0542) Resp:  [18-19] 18 (06/08 0542) BP: (115-134)/(58-86) 115/80 mmHg (06/08 0542) SpO2:  [93 %-98 %] 93 % (06/08 0542) Last BM Date: 08/08/13 690 PO recorded, No BM recorded Diet: clears/TNA started yesterday Afebrile, VSS WBC, up 18.4, Glucose up,  Prealbumin is 20.6 Film yesterday shows nonspecific nonobstructive pattern Intake/Output from previous day: August 23, 2022 0701 - 06/08 0700 In: 3455.4 [P.O.:690; I.V.:1085.4; IV Piggyback:800; TPN:880] Out: -  Intake/Output this shift:    General appearance: alert, cooperative and no distress Resp: clear to auscultation bilaterally GI: soft, non-tender; bowel sounds normal; no masses,  no organomegaly 2 scars, open cholecystectomy and open spleen.   Lab Results:   Recent Labs  08/10/13 0407  WBC 18.4*  HGB 11.2*  HCT 36.6*  PLT 426*    BMET  Recent Labs  08/22/2013 0447 08/10/13 0407  NA 137 137  K 4.4 4.2  CL 100 100  CO2 25 26  GLUCOSE 244* 216*  BUN 24* 24*  CREATININE 1.09 1.08  CALCIUM 8.6 8.6   PT/INR  Recent Labs  08/07/13 1800  LABPROT 13.5  INR 1.05     Recent Labs Lab 08/03/13 2258 08/04/13 0550 08/05/13 0245 08/06/13 0450 08/10/13 0407  AST 24 26 19 18 27   ALT 22 25 17 15 28   ALKPHOS 112 100 82 93 84  BILITOT 0.4 0.3 0.3 0.2* 0.2*  PROT 7.7 7.1 6.2 6.4 6.3  ALBUMIN 3.8 3.3* 2.8* 2.9* 2.9*     Lipase     Component Value Date/Time   LIPASE 47 08/03/2013 2258     Studies/Results: Dg Abd 2 Views  22-Aug-2013   CLINICAL DATA:  Followup evaluation of small bowel obstruction.  EXAM: ABDOMEN - 2 VIEW  COMPARISON:  08/07/2013.  FINDINGS: Previously noted nasogastric  tube has been removed. Surgical clips project over the right upper quadrant of the abdomen, compatible with prior cholecystectomy. Gas is noted throughout the colon. No distal rectal gas is identified at this time. No pathologic distention of small bowel. No pneumoperitoneum.  IMPRESSION: 1. Nonspecific, nonobstructive bowel gas pattern, as above. 2. No pneumoperitoneum.   Electronically Signed   By: Vinnie Langton M.D.   On: 08/22/2013 12:13    Medications: . ciprofloxacin  400 mg Intravenous Q12H  . heparin  5,000 Units Subcutaneous 3 times per day  . insulin aspart  0-15 Units Subcutaneous 4 times per day  . levothyroxine  87.5 mcg Intravenous Daily  . methylPREDNISolone (SOLU-MEDROL) injection  60 mg Intravenous Q12H  . metronidazole  500 mg Intravenous 4 times per day  . sodium chloride  3 mL Intravenous Q12H    Assessment/Plan 1.  Crohn's disease with stricture at T1 and recurrent obstruction. S/p cholecystectomy, splenectomy, Prior intestinal perforation with diverticulitis and SBO 4/26-28/15, 1 episode 2010.  No prior colon or small bowel resection. 2. Hx of acute respiratory failure with hypoxia/Sleep apnea 3. Hypertension 4.  Hypothyroid 5.  Body mass index is 33.8  Plan:  Clears today and possible SB resection tomorrow.  On TNA, Steroids, Cipro and Flagyl.        LOS: 7 days    Bruce [name]  [name] 08/10/2013

## 2013-08-11 NOTE — Anesthesia Procedure Notes (Signed)
Procedure Name: Intubation Date/Time: 08/11/2013 9:52 AM Performed by: Melina Copa, Adham R Pre-anesthesia Checklist: Patient identified, Emergency Drugs available, Suction available, Patient being monitored and Timeout performed Patient Re-evaluated:Patient Re-evaluated prior to inductionOxygen Delivery Method: Circle system utilized Preoxygenation: Pre-oxygenation with 100% oxygen Intubation Type: IV induction Ventilation: Mask ventilation without difficulty Laryngoscope Size: Mac and 4 Grade View: Grade III Tube type: Oral Tube size: 8.0 mm Number of attempts: 3 Airway Equipment and Method: Stylet and Bougie stylet Placement Confirmation: positive ETCO2 and breath sounds checked- equal and bilateral Secured at: 23 cm Tube secured with: Tape Dental Injury: Teeth and Oropharynx as per pre-operative assessment  Difficulty Due To: Difficulty was anticipated, Difficult Airway- due to large tongue, Difficult Airway- due to reduced neck mobility, Difficult Airway- due to anterior larynx and Difficult Airway- due to limited oral opening Future Recommendations: Recommend- induction with short-acting agent, and alternative techniques readily available Comments: Smooth IV induction, Easy mask ventilation, Grade III view, ETT placed, esophageal intubation immediately recognized and ETT removed, Grade III view, Bougie stylet passed, ETT over Bougie, esophageal intubation immediately recognized and ETT removed, SpO2 89%, mask ventilation, Grade III view by Caryl Comes MD, AOI, EtCO2 positive.

## 2013-08-11 NOTE — Progress Notes (Signed)
Lunch relief by S. Sharyn Dross RN

## 2013-08-11 NOTE — Interval H&P Note (Signed)
History and Physical Interval Note:  08/11/2013 8:53 AM  Bruce [name]  has presented today for surgery, with the diagnosis of STRICTURE  The various methods of treatment have been discussed with the patient and family. After consideration of risks, benefits and other options for treatment, the patient has consented to  Procedure(s): EXPLORATORY LAPAROTOMY AND SMALL BOWEL RESECTION (N/A) as a surgical intervention .  The patient's history has been reviewed, patient examined, no change in status, stable for surgery.  I have reviewed the patient's chart and labs.  Questions were answered to the patient's satisfaction.     [name] A. Janera Peugh

## 2013-08-11 NOTE — Progress Notes (Signed)
Pt has had TPA infusing at 83cc/hr and Lipids at 10cc/hr  entire time in PACU

## 2013-08-11 NOTE — H&P (View-Only) (Signed)
Discussed with patient and family.  Offered continued medical management but this is second bout of obstruction in 2 months and has defined stricture.  Surgical resection on steroids caries 2 to 3 fold increase risk of complication but wife doesn't want this to recur. They wish to proceed with resection given increased risk. The procedure has been discussed with the patient.  Alternative therapies have been discussed with the patient.  Operative risks include bleeding,  Infection,  Organ injury,  Nerve injury,  Blood vessel injury,  anastomtic breakdown,  Death,  And possible reoperation.  Medical management risks include worsening of present situation.  The success of the procedure is 50 -90 % at treating patients symptoms.  The patient understands and agrees to proceed.

## 2013-08-11 NOTE — Plan of Care (Signed)
Problem: Phase I Progression Outcomes Goal: Initial discharge plan identified Outcome: Progressing Dr. Clementeen Graham states that patient may be discharged by the weekend.

## 2013-08-11 NOTE — Anesthesia Postprocedure Evaluation (Signed)
  Anesthesia Post-op Note  Patient: Bruce [name]  Procedure(s) Performed: Procedure(s): EXPLORATORY LAPAROTOMY  (N/A) SMALL BOWEL RESECTION (N/A)  Patient Location: PACU  Anesthesia Type:General  Level of Consciousness: awake and alert   Airway and Oxygen Therapy: Patient Spontanous Breathing and Patient connected to nasal cannula oxygen  Post-op Pain: mild  Post-op Assessment: Post-op Vital signs reviewed, Patient's Cardiovascular Status Stable, Respiratory Function Stable, Patent Airway, No signs of Nausea or vomiting and Pain level controlled  Post-op Vital Signs: Reviewed and stable  Last Vitals:  Filed Vitals:   08/11/13 1454  BP: 113/80  Pulse: 111  Temp: 36.3 C  Resp: 19    Complications: No apparent anesthesia complications

## 2013-08-12 DIAGNOSIS — E039 Hypothyroidism, unspecified: Secondary | ICD-10-CM

## 2013-08-12 LAB — GLUCOSE, CAPILLARY
Glucose-Capillary: 171 mg/dL — ABNORMAL HIGH (ref 70–99)
Glucose-Capillary: 201 mg/dL — ABNORMAL HIGH (ref 70–99)
Glucose-Capillary: 188 mg/dL — ABNORMAL HIGH (ref 70–99)
Glucose-Capillary: 168 mg/dL — ABNORMAL HIGH (ref 70–99)

## 2013-08-12 MED ORDER — METHYLPREDNISOLONE SODIUM SUCC 40 MG IJ SOLR
40.0000 mg | Freq: Two times a day (BID) | INTRAMUSCULAR | Status: AC
  Administered 2013-08-12 – 2013-08-13 (×3): 40 mg via INTRAVENOUS
  Filled 2013-08-12 (×3): qty 1

## 2013-08-12 MED ORDER — FAMOTIDINE IN NACL 20-0.9 MG/50ML-% IV SOLN
20.0000 mg | Freq: Two times a day (BID) | INTRAVENOUS | Status: AC
  Administered 2013-08-12: 20 mg via INTRAVENOUS
  Filled 2013-08-12 (×2): qty 50

## 2013-08-12 MED ORDER — TRACE MINERALS CR-CU-F-FE-I-MN-MO-SE-ZN IV SOLN
INTRAVENOUS | Status: AC
  Administered 2013-08-12: 18:00:00 via INTRAVENOUS
  Filled 2013-08-12: qty 3000

## 2013-08-12 MED ORDER — FAT EMULSION 20 % IV EMUL
240.0000 mL | INTRAVENOUS | Status: AC
  Administered 2013-08-12: 240 mL via INTRAVENOUS
  Filled 2013-08-12: qty 250

## 2013-08-12 MED ORDER — CHLORHEXIDINE GLUCONATE 0.12 % MT SOLN
15.0000 mL | Freq: Two times a day (BID) | OROMUCOSAL | Status: DC
  Administered 2013-08-12 – 2013-08-18 (×11): 15 mL via OROMUCOSAL
  Filled 2013-08-12 (×11): qty 15

## 2013-08-12 MED ORDER — HYDROMORPHONE HCL PF 1 MG/ML IJ SOLN
0.5000 mg | INTRAMUSCULAR | Status: DC | PRN
  Administered 2013-08-12 – 2013-08-14 (×3): 0.5 mg via INTRAVENOUS
  Filled 2013-08-12 (×3): qty 1

## 2013-08-12 NOTE — Progress Notes (Addendum)
PARENTERAL NUTRITION CONSULT NOTE - FOLLOW UP  Pharmacy Consult:  TPN Indication: Crohn's ileitis / SBO  Allergies  Allergen Reactions  . Coconut Flavor Other (See Comments)    Does not eat because of crohn's     Patient Measurements: Height: 6' (182.9 cm) Weight: 251 lb 8.7 oz (114.1 kg) IBW/kg (Calculated) : 77.6  Vital Signs: Temp: 98.4 F (36.9 C) (06/10 0600) Temp src: Oral (06/10 0600) BP: 141/79 mmHg (06/10 0600) Pulse Rate: 86 (06/10 0600) Intake/Output from previous day: 06/09 0701 - 06/10 0700 In: 2408.3 [I.V.:2408.3] Out: 2180 [Urine:1925; Emesis/NG output:180; Blood:75]  Labs:  Recent Labs  08/10/13 0407 08/11/13 0630  WBC 18.4* 22.6*  HGB 11.2* 10.8*  HCT 36.6* 35.2*  PLT 426* 387     Recent Labs  08/10/13 0407 08/11/13 0450  NA 137 135*  K 4.2 4.3  CL 100 98  CO2 26 23  GLUCOSE 216* 166*  BUN 24* 23  CREATININE 1.08 1.04  CALCIUM 8.6 8.4  MG 2.5  --   PHOS 3.4  --   PROT 6.3  --   ALBUMIN 2.9*  --   AST 27  --   ALT 28  --   ALKPHOS 84  --   BILITOT 0.2*  --   PREALBUMIN 30.5  --   TRIG 64  --    Estimated Creatinine Clearance: 93.6 ml/min (by C-G formula based on Cr of 1.04).    Recent Labs  08/11/13 1740 08/11/13 2305 08/12/13 0521  GLUCAP 198* 201* 171*   Insulin Requirements in the past 24 hours:  13 units Novolog SSI and 60 units regular insulin in TPN  Current Nutrition:  Clinimix E 5/15 100 ml/hr and 20% IVFE at 10 ml/hr.  This provides 2184 kCal and 120 gm of protein daily  Nutritional Goals:  2100-2300 kCal,105-120 grams of protein per day  Assessment:  4 YOM with history of bowel obstruction a few weeks ago presented on 08/03/13 with complaints of severe abdominal cramping, abdominal distention, and vomiting that started several hours prior to arrival. He has a history of Crohn's disease, obesity, and multiple abdominal surgeries with adhesions and recurrent small bowel obstructions. Last admission was in April  of 2015 for pSBO.  Pharmacy managing TPN for nutritional support.  GI: Crohn's ileitis / SBO / unspecified constipation / obesity.  GI started steroids for significant underlying fibrosis.  6/2 CT: consistent with Crohn's flare, likely has a chronic underlying stricture. S/p exp lap with ileocecectomy 6/9. NGT ~500 ml since surgery yesterday. Noted drainage a bit red-tinged so IV pepcid added. TG 64. Prealbumin WNL at 30.5.   Endo: hypothyroid on IV Synthroid, TSH WNL.  No hx DM - CBGs 162-219 on SSI and 60 units regular insulin in the TPN.  Lytes: wnl except Na bit low  Renal: CKD3 / BPH - SCr 1.04 - UOP 0.7 ml/kg/hr. MIVF NS at 182m/hr  Pulm: OSA - 2L [place]  Cards: HTN - BP nl-elevated, HR 86-125. PRN hydralazine  Hepatobil: LFTs WNL  ID: Cipro/Flagyl D#9 (6/2 >> ) for GI coverage.  Afebrile, WBC elevated and trending up (pt also on steroids).  UCx negative.  Best Practices: SQ heparin  TPN Access: PICC placed 6/3  TPN day#: 7 (6/3 >> )  Plan:  - Continue Clinimix E 5/15 at goal rate of 1035mhr and continue 20% IVFE at 10 ml/hr.  This meets 100% of estimated kcal and protein needs. - Decrease MIVF (NS) to 50 ml/hr. Consider decrease  to [facility] tomorrow.  - Daily multivitamin and trace elements - Will place pepcid in TPN beginning tonight - will place 50 mg in 3L bag (pt will receive 40 mg total daily) - Continue SSI moderate Q6H. Will increase regular insulin in TPN to 75 units per 3L bag (pt will receive 60 units daily) - TPN labs in AM - F/u ability to restart po diet  Sherlon Handing, PharmD, BCPS Clinical pharmacist, pager [phone]  08/12/2013, 8:55 AM

## 2013-08-12 NOTE — Op Note (Signed)
NAME:  MERT, DIETRICK NO.:  [id number]  MEDICAL RECORD NO.:  [id number]  LOCATION:  6N29C                        FACILITY:  [facility]  PHYSICIAN:  Marcello Moores A. 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  POSTOPERATIVE DIAGNOSIS:  Small bowel obstruction secondary to stricture from Crohn disease involving the terminal ileum.  PROCEDURE:  Exploratory laparotomy with ileocecectomy.  SURGEON:  Marcello Moores A. Dexter Signor, M.D.  ANESTHESIA:  General endotracheal anesthesia.  ESTIMATED BLOOD LOSS:  100 mL.  IV FLUIDS:  600 mL crystalloid.  DRAINS:  None.  SPECIMEN:  Approximately 24 cm of terminal ileum and proximal cecum to Pathology.  DRAINS:  None.  INDICATIONS FOR PROCEDURE:  The patient is a 63 year old male with Crohn disease.  He has been managed medically, but continues to have recurrent small bowel obstruction from a stricture involving terminal ileum. Options of continued medical management, escalation of medical management, surgical excision, and further other medical care discussed with the patient and his wife.  He has had two bouts of small-bowel obstruction from this and it does not appear to be responding to medical management maximally.  I discussed continuous medical management for another week and then trying to wean some of his steroids off to reduce his operative complications.  We were concerned since this was recurrent and not responding well and he would not do well at home.  I discussed operative intervention with them and explained he would require a laparotomy given his multiple previous abdominal surgeries and the fact that anastomosis would have a high risk of leaking if he continues on high-dose steroids.  I discussed the possibility  of hopefully tapering these medications to reduce his complication right, but he was 2 to 3 times out of baseline while on high-dose steroids.  Also, on immunomodulators, he has risk of anastomotic leak, wound complication, infection, and the need for other operative procedures were discussed. He is at high risk of dehiscence as well.  After discussion of operative intervention what it entailed, the patient  and wife discussed and still wished to proceed with surgical excision since he was not getting better his satisfaction.  I discussed that this would be an open procedure as well and they agreed with that.  Other risks were discussed to include, but not exclusive of, bleeding, infection, death, DVT, formation of hernia, dehiscence of abdominal wall, bowel injury, organ injury, ureteral injury, kidney injury, bladder injury, injury to major blood vessels, the need for other operative procedures.  They agreed to proceed.  DESCRIPTION OF PROCEDURE:  The patient was met in the holding area and questions were answered.  He was then taken back to the operating room and placed supine on the OR table.  After induction of general endotracheal anesthesia, Foley catheter was placed under sterile conditions.  The abdomen was then prepped and draped in sterile fashion. Time-out was done.  He received 2 g of cefotetan per protocol.  Time-out was done to verify the procedure.  Midline  incision was used.  He had a previous right and left subcostal incision from cholecystectomy and splenectomy respectively.  He had small umbilical hernia.  Once we entered the abdominal cavity, retractor was placed.  He had some mild-to- moderate small bowel dilation.  I was then able to run a small bowel from his ligament of Treitz to his terminal ileum.  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   His cecum though was located in his right upper quadrant adherent to the right lobe of his liver.  We then mobilized the cecum and right colon as best we could and took down the hepatic flexure to bring this up into the operative field given its large size.  He had a stricture involving the majority of the terminal ileum.  I found an area with a creeping fat and stopped and felt that would be a good area to divide the small bowel which was right where his Meckel diverticulum was resected just proximal to that.  A GIA-75 stapling device was used to divide the small bowel there.  Mesentery was taken with LigaSure and oversewed the mesentery given the thick nature of the creeping fat and the mesentery with 2-0 Vicryl sutures.  I then found an area of soft ascending colon just distal to the cecum that appeared to be undiseased and fired the second load of the GIA stapler through that.  We then took the ileocolic vessel complex down with LigaSure and oversewed with 2-0 Vicryl.  This was passed off the field.  The remainder of his colon was felt normal and looked normal.  Stomach was adhesed back in field and felt no significant involvement of Crohn.  Again, the remainder of the small bowel was grossly normal.  We then created a side-to-side functional end-to-end anastomosis using a GIA-75 stapling device and a TA-60 to close common enterotomy defect.  Mesentery was oversewn again at this point with 2-0 Vicryl.  Common defect was closed.  Anastomosis was widely patent and a single stitch of 2-0 Vicryl was placed across the anastomosis.  This was then placed back in the right mid abdomen. There was no kinking or twisting.  The bowel was run again and no evidence of kinking or twisting noted.  Irrigation was used and suctioned out.  All sponges were removed and passed off the field, counted and found to be correct.  We then closed the fascia with #1 double-stranded PDS.  The skin and subcu fat  irrigated and then closed with staples.  Dry dressings were applied.  All final counts of sponge, needle, and instruments were found to be correct at this portion of the case.  The patient was then extubated, awoke, taken to the recovery room in satisfactory condition.     London Nonaka A. Tanicia Wolaver, M.D.     TAC/MEDQ  D:  08/11/2013  T:  08/11/2013  Job:  [id number]

## 2013-08-12 NOTE — Progress Notes (Signed)
Pt has a foley cath from PACU. No order seen, Please clarify.

## 2013-08-12 NOTE — Progress Notes (Signed)
PROGRESS NOTE  Bruce [name] WVP:[id number] DOB: [birth year] DOA: 08/03/2013 PCP: [name], MD  Assessment/Plan: Crohn's ileitis /Small bowel obstruction  -cont abx ( cipro and flagyl)--defer to GI when to d/c abx -continue IV solumedrol-->discussed with Dr. 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 GI recommended surgical resection. On IV solumedrol.  -PICC placed and beganTNA . SBO improved on x ray. -patient taken to OR 08/11/13 with laparoscopic small bowel resection. -Now on PCA and NG placed.  -d/c PCA as pt is getting <67m Dilaudid over 24 hr period--discussed with surgery [place]. [place] suggests post op pt will require anti-TNF or immunomodulator therapy to reduce the chance of recurrence at the anastamosis.  Metabolic acidosis  -resolved  Acute respiratory failure with hypoxia/Sleep apnea  Resolved.  -likely from mechanical issues from recent distended abdomen and SBO  -cont O2  -CPAP per respiratory. pt refused saying does not use at home and actually gave away his machine.  Dehydration/tachycardia  -improved  Hypertension  -BP stable  -prn Hydralazine  -Continue lisinopril Hypothyroidism  -continue IV Synthroid  Leukocytosis  Secondary to the Solu-Medrol.   Family Communication:   Pt at beside Disposition Plan:   Home when medically stable       Procedures/Studies: X-ray Chest Pa And Lateral   08/05/2013   CLINICAL DATA:  diaphoresis  EXAM: CHEST  2 VIEW  COMPARISON:  Abdominal series dated 08/03/2013  FINDINGS: The heart size and mediastinal contours are within normal limits. Stable mild discoid atelectasis versus scarring left lung base. Lungs otherwise clear. NG tube has been inserted tip not view of this study. No acute osseous abnormalities.  IMPRESSION: No active cardiopulmonary disease.    Electronically Signed   By: HMargaree MackintoshM.D.   On: 08/05/2013 09:32   Ct Abdomen Pelvis W Contrast  08/04/2013   CLINICAL DATA:  Abdominal pain. History of small bowel obstruction. History of Crohn's disease.  EXAM: CT ABDOMEN AND PELVIS WITH CONTRAST  TECHNIQUE: Multidetector CT imaging of the abdomen and pelvis was performed using the standard protocol following bolus administration of intravenous contrast.  CONTRAST:  1042mOMNIPAQUE IOHEXOL 300 MG/ML  SOLN  COMPARISON:  04/16/2013  FINDINGS: BODY WALL: Fatty umbilical and periumbilical hernias. Fatty enlargement of the bilateral inguinal canals.  LOWER CHEST: Mild atelectasis at the left base.  ABDOMEN/PELVIS:  Liver: No focal abnormality.  Biliary: Cholecystectomy.  Pancreas: Unremarkable.  Spleen: In place of the spleen, there are multiple homogeneously enhancing nodules throughout the peritoneal space, consistent with splenosis.  Adrenals: Unremarkable.  Kidneys and ureters: There are bilateral indeterminate renal cortical lesions. On the right, there is a 2 cm lesion in the posterior interpolar kidney that measures greater than water density at 50 Hounsfield units There is no definitive deenhancement. In the left kidney there are two somewhat ill-defined and mildly hyperdense lesions both measuring approximately 12 mm in the interpolar region.  Bladder: Unremarkable.  Reproductive: Mild prostate enlargement.  Bowel: Multiple segments of dilated and fluid-filled small bowel leading up to the terminal ileum where there is abrupt transition point in a region of wall thickening. The mucosa in this region is avidly enhancing. There is alternating narrow and dilated segments of the terminal ileum. Small bowel obstruction causes reactive edema in the associated mesenteries and in the neighboring peritoneal compartment. No evidence of  bowel perforation or devascularization. Nasogastric tube tip in the distal ligament. No pericecal inflammation.   Retroperitoneum: No mass or adenopathy.  Peritoneum: Splenosis as above.  Vascular: No acute abnormality.  OSSEOUS: No acute abnormalities.  IMPRESSION: 1. Small bowel obstruction at the level of the terminal ileum where there is active Crohn's enteritis. There could be a superimposed stenosis at this level. 2. Small bilateral indeterminate renal lesions, complex cyst versus solid mass. Due to patient size, ultrasound would be challenging; recommend followup outpatient MRI (when the patient is able to cooperate with breathing instructions). 3. Splenosis.   Electronically Signed   By: Jorje Guild M.D.   On: 08/04/2013 05:26   Dg Abd 2 Views  08/09/2013   CLINICAL DATA:  Followup evaluation of small bowel obstruction.  EXAM: ABDOMEN - 2 VIEW  COMPARISON:  08/07/2013.  FINDINGS: Previously noted nasogastric tube has been removed. Surgical clips project over the right upper quadrant of the abdomen, compatible with prior cholecystectomy. Gas is noted throughout the colon. No distal rectal gas is identified at this time. No pathologic distention of small bowel. No pneumoperitoneum.  IMPRESSION: 1. Nonspecific, nonobstructive bowel gas pattern, as above. 2. No pneumoperitoneum.   Electronically Signed   By: Vinnie Langton M.D.   On: 08/09/2013 12:13   Dg Abd 2 Views  08/07/2013   CLINICAL DATA:  Follow up partial small bowel obstruction.  EXAM: ABDOMEN - 2 VIEW  COMPARISON:  08/06/2013  FINDINGS: Enteric tube remains in place with tip in the expected region of the distal stomach or duodenal bulb. Right upper quadrant surgical clips are again seen. Residual oral contrast material in the left colon does not appear significantly changed. A few small air-fluid levels are again seen in the right mid to upper abdomen, slightly less prominent than on the prior study. There is otherwise a relative paucity of small bowel gas. No intraperitoneal free air is identified. Mild lumbar dextroscoliosis is again seen. Mild  elevation the right hemidiaphragm does not appear significantly changed.  IMPRESSION: A few small air-fluid levels in the right abdomen, slightly less prominent than on the prior study and suggestive of improving partial small bowel obstruction.   Electronically Signed   By: Logan Bores   On: 08/07/2013 08:34   Dg Abd 2 Views  08/06/2013   CLINICAL DATA:  Evaluate for small bowel obstruction.  EXAM: ABDOMEN - 2 VIEW  COMPARISON:  08/04/2013 and 08/03/2013  FINDINGS: Nasogastric tube tip is near the duodenum bulb region. There is oral contrast in the left colon. Few gas-filled loops of bowel in the mid abdomen. Few air-fluid levels in the right upper abdomen. No evidence for free air.  IMPRESSION: The degree of small bowel obstruction has decreased since 08/03/2013. Oral contrast has advanced into the left colon. There continues to be a few gas-filled loops of small bowel and findings probably represent a partial small bowel obstruction.   Electronically Signed   By: Markus Daft M.D.   On: 08/06/2013 08:37   Dg Abd Acute W/chest  08/04/2013   CLINICAL DATA:  Abdominal pain and vomiting, history of small bowel obstruction.  EXAM: ACUTE ABDOMEN SERIES (ABDOMEN 2 VIEW & CHEST 1 VIEW)  COMPARISON:  Abdominal radiograph June 29, 2013.  FINDINGS: Cardiomediastinal silhouette is unremarkable. Strandy densities in left lung base. No pleural effusions. No pneumothorax. Soft tissue planes and included osseous structures are nonsuspicious.  Gas distended small bowel to 4.4 cm with paucity of large bowel gas. Small bowel air-fluid  levels at relatively uniform level on upright view. No intraperitoneal free air.  No intra-abdominal mass effect or pathologic calcifications. Surgical clips in the right abdomen may reflect cholecystectomy. Phleboliths in the pelvis. Soft tissue planes and included osseous structures are nonsuspicious.  IMPRESSION: No acute cardiopulmonary process.  Recurrent high-grade small bowel obstruction.    Electronically Signed   By: Elon Alas   On: 08/04/2013 00:14         Subjective: Patient denies fevers, chills, headache, chest pain, dyspnea, nausea, vomiting, diarrhea, abdominal pain, dysuria, hematuria. Pain is fairly well-controlled. The patient is walking the hallways.   Objective: Filed Vitals:   08/12/13 0948 08/12/13 0956 08/12/13 1255 08/12/13 1330  BP:  124/70  126/75  Pulse:  75  84  Temp:  98.1 F (36.7 C)  97.7 F (36.5 C)  TempSrc:  Oral  Oral  Resp: 14 16 12 14   Height:      Weight:      SpO2: 95% 95%  95%    Intake/Output Summary (Last 24 hours) at 08/12/13 1533 Last data filed at 08/12/13 1331  Gross per 24 hour  Intake 688.33 ml  Output   2825 ml  Net -2136.67 ml   Weight change: 0.9 kg (1 lb 15.8 oz) Exam:   General:  Pt is alert, follows commands appropriately, not in acute distress  HEENT: No icterus, No thrush,  Bruce [name]  Cardiovascular: RRR, S1/S2, no rubs, no gallops  Respiratory: CTA bilaterally, no wheezing, no crackles, no rhonchi  Abdomen: Soft/+BS, mildly tender at the incision site, non distended, no guarding; midline incision without erythema or drainage  Extremities: No edema, No lymphangitis, No petechiae, No rashes, no synovitis  Data Reviewed: Basic Metabolic Panel:  Recent Labs Lab 08/06/13 0450 08/07/13 0522 08/09/13 0447 08/10/13 0407 08/11/13 0450  NA 139 140 137 137 135*  K 4.0 4.0 4.4 4.2 4.3  CL 103 104 100 100 98  CO2 22 24 25 26 23   GLUCOSE 146* 151* 244* 216* 166*  BUN 19 21 24* 24* 23  CREATININE 0.99 1.02 1.09 1.08 1.04  CALCIUM 8.5 8.8 8.6 8.6 8.4  MG 2.1 2.3  --  2.5  --   PHOS 2.3 3.0  --  3.4  --    Liver Function Tests:  Recent Labs Lab 08/06/13 0450 08/10/13 0407  AST 18 27  ALT 15 28  ALKPHOS 93 84  BILITOT 0.2* 0.2*  PROT 6.4 6.3  ALBUMIN 2.9* 2.9*   No results found for this basename: LIPASE, AMYLASE,  in the last 168 hours No results found for this basename: AMMONIA,  in  the last 168 hours CBC:  Recent Labs Lab 08/10/13 0407 08/11/13 0630  WBC 18.4* 22.6*  NEUTROABS 14.3*  --   HGB 11.2* 10.8*  HCT 36.6* 35.2*  MCV 80.1 79.1  PLT 426* 387   Cardiac Enzymes: No results found for this basename: CKTOTAL, CKMB, CKMBINDEX, TROPONINI,  in the last 168 hours BNP: No components found with this basename: POCBNP,  CBG:  Recent Labs Lab 08/11/13 1221 08/11/13 1740 08/11/13 2305 08/12/13 0521 08/12/13 1145  GLUCAP 219* 198* 201* 171* 201*    Recent Results (from the past 240 hour(s))  MRSA PCR SCREENING     Status: None   Collection Time    08/04/13  2:26 AM      Result Value Ref Range Status   MRSA by PCR NEGATIVE  NEGATIVE Final   Comment:  The GeneXpert MRSA Assay (FDA     approved for NASAL specimens     only), is one component of a     comprehensive MRSA colonization     surveillance program. It is not     intended to diagnose MRSA     infection nor to guide or     monitor treatment for     MRSA infections.  URINE CULTURE     Status: None   Collection Time    08/04/13  6:31 PM      Result Value Ref Range Status   Specimen Description URINE, RANDOM   Final   Special Requests NONE   Final   Culture  Setup Time     Final   Value: 08/04/2013 19:34     Performed at [place]     Final   Value: NO GROWTH     Performed at [facility]   Culture     Final   Value: NO GROWTH     Performed at [facility]   Report Status 08/05/2013 FINAL   Final  SURGICAL PCR SCREEN     Status: None   Collection Time    08/10/13 12:29 AM      Result Value Ref Range Status   MRSA, PCR NEGATIVE  NEGATIVE Final   Staphylococcus aureus NEGATIVE  NEGATIVE Final   Comment:            The Xpert SA Assay (FDA     approved for NASAL specimens     in patients over 41 years of age),     is one component of     a comprehensive surveillance     program.  Test performance has     been validated by [employer] [facility] for patients greater     than or equal to 52 year old.     It is not intended     to diagnose infection nor to     guide or monitor treatment.     Scheduled Meds: . chlorhexidine  15 mL Mouth Rinse BID  . ciprofloxacin  400 mg Intravenous Q12H  . dutasteride  0.5 mg Oral Daily  . heparin  5,000 Units Subcutaneous 3 times per day  . HYDROmorphone PCA 0.3 mg/mL   Intravenous 6 times per day  . insulin aspart  0-15 Units Subcutaneous 4 times per day  . levothyroxine  87.5 mcg Intravenous Daily  . lisinopril  10 mg Oral Daily  . mesalamine  1,000 mg Oral QID  . methylPREDNISolone (SOLU-MEDROL) injection  60 mg Intravenous Q12H  . metronidazole  500 mg Intravenous 4 times per day  . sodium chloride  3 mL Intravenous Q12H   Continuous Infusions: . sodium chloride 50 mL/hr at 08/12/13 1202  . Marland KitchenTPN (CLINIMIX-E) Adult 100 mL/hr at 08/11/13 2002   And  . fat emulsion 240 mL (08/11/13 2002)  . Marland KitchenTPN (CLINIMIX-E) Adult     And  . fat emulsion       Ishika Chesterfield, Jahson, DO  [facility] Pager [phone]  If 7PM-7AM, please contact night-coverage [email or web address] Password [email or web address] 08/12/2013, 3:33 PM   LOS: 9 days

## 2013-08-12 NOTE — Progress Notes (Signed)
1 Day Post-Op  Subjective: Pretty sore, CO2 keeps going off on PCA, he has sleep apnea, but does not use CPAP at home.  No flatus, about 500 in NG cannister, I irrigated it and it's working.  Drainage is a bit red tinged, so I will add PPI.  He has fairly significant BPH and concerned about voiding without Avodart.  I told him we would leave foley in today.  No IS in the room.  Objective: Vital signs in last 24 hours: Temp:  [97.3 F (36.3 C)-98.9 F (37.2 C)] 98.4 F (36.9 C) (06/10 0600) Pulse Rate:  [86-125] 86 (06/10 0600) Resp:  [6-21] 12 (06/10 0600) BP: (105-171)/(79-109) 141/79 mmHg (06/10 0600) SpO2:  [92 %-98 %] 97 % (06/10 0600) Weight:  [114.1 kg (251 lb 8.7 oz)] 114.1 kg (251 lb 8.7 oz) (06/10 0500) Last BM Date: 08/10/13 NPO/TNA 180 ml per NG Afebrile, some tachycardia, better now Glucose up Na down some  WBC 22.6 He has had 8 days of cipro/flagyl Intake/Output from previous day: 06/09 0701 - 06/10 0700 In: 2408.3 [I.V.:2408.3] Out: 2180 [Urine:1925; Emesis/NG output:180; Blood:75] Intake/Output this shift:    General appearance: alert, cooperative and no distress Resp: clear to auscultation bilaterally Cardio: regular rate and rhythm, S1, S2 normal, no murmur, click, rub or gallop GI: large abdomen, no bowel sounds, incision looks fine, redressed..  Lab Results:   Recent Labs  08/10/13 0407 08/11/13 0630  WBC 18.4* 22.6*  HGB 11.2* 10.8*  HCT 36.6* 35.2*  PLT 426* 387    BMET  Recent Labs  08/10/13 0407 08/11/13 0450  NA 137 135*  K 4.2 4.3  CL 100 98  CO2 26 23  GLUCOSE 216* 166*  BUN 24* 23  CREATININE 1.08 1.04  CALCIUM 8.6 8.4   PT/INR No results found for this basename: LABPROT, INR,  in the last 72 hours   Recent Labs Lab 08/06/13 0450 08/10/13 0407  AST 18 27  ALT 15 28  ALKPHOS 93 84  BILITOT 0.2* 0.2*  PROT 6.4 6.3  ALBUMIN 2.9* 2.9*     Lipase     Component Value Date/Time   LIPASE 47 08/03/2013 2258      Studies/Results: No results found.  Medications: . chlorhexidine  15 mL Mouth Rinse BID  . ciprofloxacin  400 mg Intravenous Q12H  . dutasteride  0.5 mg Oral Daily  . heparin  5,000 Units Subcutaneous 3 times per day  . HYDROmorphone PCA 0.3 mg/mL   Intravenous 6 times per day  . insulin aspart  0-15 Units Subcutaneous 4 times per day  . levothyroxine  87.5 mcg Intravenous Daily  . lisinopril  10 mg Oral Daily  . mesalamine  1,000 mg Oral QID  . methylPREDNISolone (SOLU-MEDROL) injection  60 mg Intravenous Q12H  . metronidazole  500 mg Intravenous 4 times per day  . sodium chloride  3 mL Intravenous Q12H    Assessment/Plan 1.  Small bowel obstruction secondary to stricture from Crohn disease involving the terminal ileum. Exploratory laparotomy with ileocecectomy. Bruce [name], M.D. 08/11/2013. 2.  S/p cholecystectomy, splenectomy, Prior intestinal perforation with diverticulitis and SBO 4/26-28/15, 1 episode 2010. No prior colon or small bowel resection. 3. Hx of acute respiratory failure with hypoxia/Sleep apnea 4.   Hypertension  5.  Hypothyroid  6. Body mass index is 33.8  7.  Hx of BPH   Plan:  WBC up and he is on steroids.  Will ask Medicine to wean steroids down to  help prevent an anastomotic leak.  Leave foley in today, start to mobilize more. IS, add Pepcid IV for gastric irritation.  Leave NG in for today.   He will continue TNA till taking PO's well and labs ordered for tomorrow.       LOS: 9 days    Bruce [name] 08/12/2013

## 2013-08-12 NOTE — Progress Notes (Addendum)
Subjective: Since I last evaluated the patient, he has had laparoscopic small bowel resection and has an NGT in. He claims he is feeling much better. Still has not passed any flatus or stool. On TNA now. He was started on Pentasa after surgery but is having a hard time getting it down.   Objective: Vital signs in last 24 hours: Temp:  [97.6 F (36.4 C)-98.9 F (37.2 C)] 97.7 F (36.5 C) (06/10 1330) Pulse Rate:  [75-125] 84 (06/10 1330) Resp:  [10-21] 16 (06/10 1601) BP: (120-141)/(70-91) 126/75 mmHg (06/10 1330) SpO2:  [95 %-98 %] 95 % (06/10 1330) Weight:  [114.1 kg (251 lb 8.7 oz)] 114.1 kg (251 lb 8.7 oz) (06/10 0500) Last BM Date: 08/10/13  Intake/Output from previous day: 06/09 0701 - 06/10 0700 In: 2408.3 [I.V.:2408.3] Out: 2180 [Urine:1925; Emesis/NG output:180; Blood:75] Intake/Output this shift:   General appearance: alert, cooperative, appears stated age and morbidly obese, NGT in place but has been clamped for now Resp: clear to auscultation bilaterally Cardio: regular rate and rhythm, S1, S2 normal, no murmur, click, rub or gallop GI: soft, mild tenderness on palpation with hypoactive faint bowel sounds; no masses,  no organomegaly Extremities: extremities normal, atraumatic, no cyanosis or edema  Lab Results:  Recent Labs  08/10/13 0407 08/11/13 0630  WBC 18.4* 22.6*  HGB 11.2* 10.8*  HCT 36.6* 35.2*  PLT 426* 387   BMET  Recent Labs  08/10/13 0407 08/11/13 0450  NA 137 135*  K 4.2 4.3  CL 100 98  CO2 26 23  GLUCOSE 216* 166*  BUN 24* 23  CREATININE 1.08 1.04  CALCIUM 8.6 8.4   LFT  Recent Labs  08/10/13 0407  PROT 6.3  ALBUMIN 2.9*  AST 27  ALT 28  ALKPHOS 84  BILITOT 0.2*   Medications: I have reviewed the patient's current medications.  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 Pentasa will NOT help in this situation. He needs to be on biologics and  immunomodulators and biologics. It is OK to start tapering off his steroids now and plan to start therapy after he starts on a regular diet and has recovered from surgery. I have discussed this with Dr. Carles Collet. I would leave him on antibiotics for another 48 hours.   2) Anemia of chronic disease.   3) HTN.   LOS: 9 days   Ashly Goethe 08/12/2013, 8:08 PM

## 2013-08-12 NOTE — Progress Notes (Signed)
Need to wean steroids down if possible since this will increase complications 3 fold.

## 2013-08-12 NOTE — Progress Notes (Signed)
D/C PCA Hydromorphone 1715, 15 ml, waste in sink with [place]

## 2013-08-13 ENCOUNTER — Inpatient Hospital Stay (HOSPITAL_COMMUNITY): Payer: 59

## 2013-08-13 ENCOUNTER — Encounter (HOSPITAL_COMMUNITY): Payer: Self-pay | Admitting: Surgery

## 2013-08-13 LAB — COMPREHENSIVE METABOLIC PANEL
ALT: 166 U/L — ABNORMAL HIGH (ref 0–53)
AST: 147 U/L — ABNORMAL HIGH (ref 0–37)
Albumin: 2.6 g/dL — ABNORMAL LOW (ref 3.5–5.2)
Alkaline Phosphatase: 71 U/L (ref 39–117)
BUN: 24 mg/dL — ABNORMAL HIGH (ref 6–23)
CO2: 28 mEq/L (ref 19–32)
Calcium: 8.5 mg/dL (ref 8.4–10.5)
Chloride: 100 mEq/L (ref 96–112)
Creatinine, Ser: 1 mg/dL (ref 0.50–1.35)
GFR calc Af Amer: 90 mL/min — ABNORMAL LOW (ref >90)
GFR calc non Af Amer: 78 mL/min — ABNORMAL LOW (ref >90)
Glucose, Bld: 176 mg/dL — ABNORMAL HIGH (ref 70–99)
Potassium: 4.6 mEq/L (ref 3.7–5.3)
Sodium: 138 mEq/L (ref 137–147)
Total Bilirubin: 0.4 mg/dL (ref 0.3–1.2)
Total Protein: 5.9 g/dL — ABNORMAL LOW (ref 6.0–8.3)

## 2013-08-13 LAB — GLUCOSE, CAPILLARY
Glucose-Capillary: 169 mg/dL — ABNORMAL HIGH (ref 70–99)
Glucose-Capillary: 179 mg/dL — ABNORMAL HIGH (ref 70–99)
Glucose-Capillary: 141 mg/dL — ABNORMAL HIGH (ref 70–99)

## 2013-08-13 LAB — PHOSPHORUS: Phosphorus: 2.8 mg/dL (ref 2.3–4.6)

## 2013-08-13 LAB — MAGNESIUM: Magnesium: 2.4 mg/dL (ref 1.5–2.5)

## 2013-08-13 MED ORDER — SODIUM CHLORIDE 0.9 % IV SOLN: INTRAVENOUS | Status: DC

## 2013-08-13 MED ORDER — FAT EMULSION 20 % IV EMUL
240.0000 mL | INTRAVENOUS | Status: AC
  Administered 2013-08-13: 240 mL via INTRAVENOUS
  Filled 2013-08-13: qty 250

## 2013-08-13 MED ORDER — TRACE MINERALS CR-CU-F-FE-I-MN-MO-SE-ZN IV SOLN
INTRAVENOUS | Status: AC
  Administered 2013-08-13: 17:00:00 via INTRAVENOUS
  Filled 2013-08-13: qty 3000

## 2013-08-13 MED ORDER — METHYLPREDNISOLONE SODIUM SUCC 125 MG IJ SOLR
60.0000 mg | Freq: Every day | INTRAMUSCULAR | Status: DC
  Administered 2013-08-14: 60 mg via INTRAVENOUS
  Filled 2013-08-13: qty 0.96

## 2013-08-13 NOTE — Progress Notes (Signed)
PARENTERAL NUTRITION CONSULT NOTE - FOLLOW UP  Pharmacy Consult:  TPN Indication: Crohn's ileitis / SBO  Allergies  Allergen Reactions  . Coconut Flavor Other (See Comments)    Does not eat because of crohn's     Patient Measurements: Height: 6' (182.9 cm) Weight: 258 lb 13.1 oz (117.4 kg) IBW/kg (Calculated) : 77.6  Vital Signs: Temp: 97.2 F (36.2 C) (06/11 0612) Temp src: Oral (06/11 0612) BP: 117/66 mmHg (06/11 0612) Pulse Rate: 99 (06/11 0612) Intake/Output from previous day: 06/10 0701 - 06/11 0700 In: 3871.7 [P.O.:150; I.V.:1551.7; NG/GT:150; IV Piggyback:700; TPN:1320] Out: 4000 [Urine:3550; Emesis/NG output:450]  Labs:  Recent Labs  08/11/13 0630  WBC 22.6*  HGB 10.8*  HCT 35.2*  PLT 387     Recent Labs  08/11/13 0450 08/13/13 0616  NA 135* 138  K 4.3 4.6  CL 98 100  CO2 23 28  GLUCOSE 166* 176*  BUN 23 24*  CREATININE 1.04 1.00  CALCIUM 8.4 8.5  MG  --  2.4  PHOS  --  2.8  PROT  --  5.9*  ALBUMIN  --  2.6*  AST  --  147*  ALT  --  166*  ALKPHOS  --  71  BILITOT  --  0.4   Estimated Creatinine Clearance: 98.7 ml/min (by C-G formula based on Cr of 1).    Recent Labs  08/12/13 1748 08/12/13 2233 08/13/13 0614  GLUCAP 188* 168* 169*   Insulin Requirements in the past 12 hours:  9 units Novolog SSI and 75 units regular insulin in TPN  Current Nutrition:  Clinimix E 5/15 100 ml/hr and 20% IVFE at 10 ml/hr.  This provides 2184 kCal and 120 gm of protein daily  Nutritional Goals:  2100-2300 kCal,105-120 grams of protein per day  Assessment:  59 YOM with history of bowel obstruction a few weeks ago presented on 08/03/13 with complaints of severe abdominal cramping, abdominal distention, and vomiting that started several hours prior to arrival. He has a history of Crohn's disease, obesity, and multiple abdominal surgeries with adhesions and recurrent small bowel obstructions. Last admission was in April of 2015 for pSBO.  Pharmacy managing  TPN for nutritional support.  GI: Crohn's ileitis / SBO / unspecified constipation / obesity.  GI started steroids for significant underlying fibrosis.  6/2 CT: consistent with Crohn's flare, likely has a chronic underlying stricture. S/p exp lap with ileocecectomy 6/9. NGT 450 ml today  IV pepcid  TG 64. Prealbumin WNL at 30.5.   Endo: hypothyroid on IV Synthroid, TSH WNL.  No hx DM - CBGs 168-188 on SSI and 75 units regular insulin in the TPN.  Lytes: Na 138, K 4.6, Mag 2.4 Phos 2.8  Renal: CKD3 / BPH - SCr 1.0- UOP 1.3 ml/kg/hr. MIVF NS at 33m/hr  Pulm: OSA - 2L [place]  Cards: HTN - BP nl-elevated, HR 99-109. PRN hydralazine lisinopril  Hepatobil: AST/ALT elevated  ID: Cipro/Flagyl D#10 (6/2 >> ) for GI coverage.  Afebrile, WBC elevated and trending up (pt also on steroids).  UCx negative.  Best Practices: SQ heparin  TPN Access: PICC placed 6/3  TPN day#: 8 (6/3 >> )  Plan:  - Continue Clinimix E 5/15 at goal rate of 1029mhr and continue 20% IVFE at 10 ml/hr.  This meets 100% of estimated kcal and protein needs. - [facility] Daily multivitamin and trace elements - Cont pepcid in TPN - Continue SSI moderate Q6H. Cont regular insulin in TPN  to 75 units per 3L bag (pt will receive 60 units daily) - BMET in am - F/u ability to restart po diet  Excell Seltzer, PharmD Clinical pharmacist, pager [phone]  08/13/2013, 8:46 AM

## 2013-08-13 NOTE — Progress Notes (Signed)
Subjective: No acute events.  Feeling well.  No passage of flatus or bowel movements yet.  Objective: Vital signs in last 24 hours: Temp:  [97.2 F (36.2 C)-98.1 F (36.7 C)] 97.2 F (36.2 C) (06/11 0612) Pulse Rate:  [75-109] 99 (06/11 0612) Resp:  [12-16] 16 (06/11 0612) BP: (102-142)/(65-75) 117/66 mmHg (06/11 0612) SpO2:  [93 %-95 %] 94 % (06/11 0612) Weight:  [258 lb 13.1 oz (117.4 kg)] 258 lb 13.1 oz (117.4 kg) (06/11 0612) Last BM Date: 08/10/13  Intake/Output from previous day: 06/10 0701 - 06/11 0700 In: 3318.3 [P.O.:150; I.V.:1498.3; NG/GT:50; IV Piggyback:300; TPN:1320] Out: 4000 [Urine:3550; Emesis/NG output:450] Intake/Output this shift: Total I/O In: 596.7 [I.V.:596.7] Out: 2500 [Urine:2250; Emesis/NG output:250]  General appearance: alert and no distress GI: soft, nontender, no tympany  Lab Results:  Recent Labs  08/11/13 0630  WBC 22.6*  HGB 10.8*  HCT 35.2*  PLT 387   BMET  Recent Labs  08/11/13 0450  NA 135*  K 4.3  CL 98  CO2 23  GLUCOSE 166*  BUN 23  CREATININE 1.04  CALCIUM 8.4   LFT No results found for this basename: PROT, ALBUMIN, AST, ALT, ALKPHOS, BILITOT, BILIDIR, IBILI,  in the last 72 hours PT/INR No results found for this basename: LABPROT, INR,  in the last 72 hours Hepatitis Panel No results found for this basename: HEPBSAG, HCVAB, HEPAIGM, HEPBIGM,  in the last 72 hours C-Diff No results found for this basename: CDIFFTOX,  in the last 72 hours Fecal Lactopherrin No results found for this basename: FECLLACTOFRN,  in the last 72 hours  Studies/Results: No results found.  Medications:  Scheduled: . chlorhexidine  15 mL Mouth Rinse BID  . ciprofloxacin  400 mg Intravenous Q12H  . dutasteride  0.5 mg Oral Daily  . heparin  5,000 Units Subcutaneous 3 times per day  . insulin aspart  0-15 Units Subcutaneous 4 times per day  . levothyroxine  87.5 mcg Intravenous Daily  . lisinopril  10 mg Oral Daily  .  methylPREDNISolone (SOLU-MEDROL) injection  40 mg Intravenous Q12H  . metronidazole  500 mg Intravenous 4 times per day  . sodium chloride  3 mL Intravenous Q12H   Continuous: . sodium chloride 50 mL/hr at 08/12/13 2344  . Marland KitchenTPN (CLINIMIX-E) Adult 100 mL/hr at 08/12/13 1820   And  . fat emulsion 240 mL (08/12/13 1819)    Assessment/Plan: 1) Crohn's stricture s/p resection.   The patient is well at this time.  By all accounts, he is considered as a surgical remission, but it is difficult to say when he will have a recurrence.  I agree with Dr. Collene Mares.  He needs to be on AZA/6-MP or Humira.  My preference is for Humira with the severity of his disease.  The studies are not definitive as to when to start the medications for prophylaxis, but I think he should be started in a month or so.  Some studies report that patients can be started within a week or two.  Regardless, these medications can be managed by his primary GI, Dr. [name].  Plan: 1) Routine post-op care. 2) Follow up biopsies. 3) Taper steroids.   LOS: 10 days   Bruce [name] D 08/13/2013, 6:49 AM

## 2013-08-13 NOTE — Progress Notes (Signed)
Agree with dietetic intern note. Will continue to monitor for diet advancement and PO adequacy. Pryor Ochoa RD, LDN Pager: [phone] After Hours Pager: [phone]

## 2013-08-13 NOTE — Progress Notes (Signed)
PROGRESS NOTE  Bruce [name] JZP:[id number] DOB: January 17, 1951 DOA: 08/03/2013 PCP: [name], MD  Assessment/Plan: Crohn's ileitis /Small bowel obstruction  -cont abx ( cipro and flagyl)--defer to GI when to d/c abx  -continue IV solumedrol-->discussed with Dr. Liborio Nixon decreasing solumedrol  - he has a combination of acute Crohn's exacerbation in setting of fibrostenosing disease that likely will not respond to medication. GI recommended surgical resection. On IV solumedrol.  -PICC placed and beganTNA . SBO improved on x ray.  -patient taken to OR 08/11/13 with laparoscopic small bowel resection.   -d/c PCA as pt is getting <26m Dilaudid over 24 hr period--discussed with surgery  -[place] suggests post op pt will require anti-TNF or immunomodulator therapy to reduce the chance of recurrence at the anastamosis.  -08/13/13 NG clamped--appreciate surgery followup Transaminasemia -Likely due to TPN -Right upper quadrant ultrasound -Continue to trend Metabolic acidosis  -resolved  Acute respiratory failure with hypoxia/Sleep apnea  Resolved.  -likely from mechanical issues from recent distended abdomen and SBO  -cont O2  -CPAP per respiratory. pt refused saying does not use at home and actually gave away his machine.  Dehydration/tachycardia  -improved  Hypertension  -BP stable  -prn Hydralazine  -Continue lisinopril  Hypothyroidism  -continue IV Synthroid  Leukocytosis  Secondary to the Solu-Medrol. -continue to wean steroids -d/c antibiotic if okay with surgery  Family Communication:   wife at beside Disposition Plan:   Home when medically stable        Procedures/Studies: X-ray Chest Pa And Lateral   08/05/2013   CLINICAL DATA:  diaphoresis  EXAM: CHEST  2 VIEW  COMPARISON:  Abdominal series dated 08/03/2013  FINDINGS: The heart size and mediastinal contours are within normal limits. Stable mild discoid atelectasis versus  scarring left lung base. Lungs otherwise clear. NG tube has been inserted tip not view of this study. No acute osseous abnormalities.  IMPRESSION: No active cardiopulmonary disease.   Electronically Signed   By: HMargaree MackintoshM.D.   On: 08/05/2013 09:32   Ct Abdomen Pelvis W Contrast  08/04/2013   CLINICAL DATA:  Abdominal pain. History of small bowel obstruction. History of Crohn's disease.  EXAM: CT ABDOMEN AND PELVIS WITH CONTRAST  TECHNIQUE: Multidetector CT imaging of the abdomen and pelvis was performed using the standard protocol following bolus administration of intravenous contrast.  CONTRAST:  1063mOMNIPAQUE IOHEXOL 300 MG/ML  SOLN  COMPARISON:  04/16/2013  FINDINGS: BODY WALL: Fatty umbilical and periumbilical hernias. Fatty enlargement of the bilateral inguinal canals.  LOWER CHEST: Mild atelectasis at the left base.  ABDOMEN/PELVIS:  Liver: No focal abnormality.  Biliary: Cholecystectomy.  Pancreas: Unremarkable.  Spleen: In place of the spleen, there are multiple homogeneously enhancing nodules throughout the peritoneal space, consistent with splenosis.  Adrenals: Unremarkable.  Kidneys and ureters: There are bilateral indeterminate renal cortical lesions. On the right, there is a 2 cm lesion in the posterior interpolar kidney that measures greater than water density at 50 Hounsfield units There is no definitive deenhancement. In the left kidney there are two somewhat ill-defined and mildly hyperdense lesions both measuring approximately 12 mm in the interpolar region.  Bladder: Unremarkable.  Reproductive: Mild prostate enlargement.  Bowel: Multiple segments of dilated and fluid-filled small bowel leading up to the terminal ileum where there is abrupt transition point in a region of wall thickening. The mucosa in this region is avidly enhancing. There is alternating  narrow and dilated segments of the terminal ileum. Small bowel obstruction causes reactive edema in the associated mesenteries and in  the neighboring peritoneal compartment. No evidence of bowel perforation or devascularization. Nasogastric tube tip in the distal ligament. No pericecal inflammation.  Retroperitoneum: No mass or adenopathy.  Peritoneum: Splenosis as above.  Vascular: No acute abnormality.  OSSEOUS: No acute abnormalities.  IMPRESSION: 1. Small bowel obstruction at the level of the terminal ileum where there is active Crohn's enteritis. There could be a superimposed stenosis at this level. 2. Small bilateral indeterminate renal lesions, complex cyst versus solid mass. Due to patient size, ultrasound would be challenging; recommend followup outpatient MRI (when the patient is able to cooperate with breathing instructions). 3. Splenosis.   Electronically Signed   By: Jorje Guild M.D.   On: 08/04/2013 05:26   Dg Abd 2 Views  08/09/2013   CLINICAL DATA:  Followup evaluation of small bowel obstruction.  EXAM: ABDOMEN - 2 VIEW  COMPARISON:  08/07/2013.  FINDINGS: Previously noted nasogastric tube has been removed. Surgical clips project over the right upper quadrant of the abdomen, compatible with prior cholecystectomy. Gas is noted throughout the colon. No distal rectal gas is identified at this time. No pathologic distention of small bowel. No pneumoperitoneum.  IMPRESSION: 1. Nonspecific, nonobstructive bowel gas pattern, as above. 2. No pneumoperitoneum.   Electronically Signed   By: Vinnie Langton M.D.   On: 08/09/2013 12:13   Dg Abd 2 Views  08/07/2013   CLINICAL DATA:  Follow up partial small bowel obstruction.  EXAM: ABDOMEN - 2 VIEW  COMPARISON:  08/06/2013  FINDINGS: Enteric tube remains in place with tip in the expected region of the distal stomach or duodenal bulb. Right upper quadrant surgical clips are again seen. Residual oral contrast material in the left colon does not appear significantly changed. A few small air-fluid levels are again seen in the right mid to upper abdomen, slightly less prominent than on the  prior study. There is otherwise a relative paucity of small bowel gas. No intraperitoneal free air is identified. Mild lumbar dextroscoliosis is again seen. Mild elevation the right hemidiaphragm does not appear significantly changed.  IMPRESSION: A few small air-fluid levels in the right abdomen, slightly less prominent than on the prior study and suggestive of improving partial small bowel obstruction.   Electronically Signed   By: Logan Bores   On: 08/07/2013 08:34   Dg Abd 2 Views  08/06/2013   CLINICAL DATA:  Evaluate for small bowel obstruction.  EXAM: ABDOMEN - 2 VIEW  COMPARISON:  08/04/2013 and 08/03/2013  FINDINGS: Nasogastric tube tip is near the duodenum bulb region. There is oral contrast in the left colon. Few gas-filled loops of bowel in the mid abdomen. Few air-fluid levels in the right upper abdomen. No evidence for free air.  IMPRESSION: The degree of small bowel obstruction has decreased since 08/03/2013. Oral contrast has advanced into the left colon. There continues to be a few gas-filled loops of small bowel and findings probably represent a partial small bowel obstruction.   Electronically Signed   By: Markus Daft M.D.   On: 08/06/2013 08:37   Dg Abd Acute W/chest  08/04/2013   CLINICAL DATA:  Abdominal pain and vomiting, history of small bowel obstruction.  EXAM: ACUTE ABDOMEN SERIES (ABDOMEN 2 VIEW & CHEST 1 VIEW)  COMPARISON:  Abdominal radiograph June 29, 2013.  FINDINGS: Cardiomediastinal silhouette is unremarkable. Strandy densities in left lung base. No pleural effusions. No  pneumothorax. Soft tissue planes and included osseous structures are nonsuspicious.  Gas distended small bowel to 4.4 cm with paucity of large bowel gas. Small bowel air-fluid levels at relatively uniform level on upright view. No intraperitoneal free air.  No intra-abdominal mass effect or pathologic calcifications. Surgical clips in the right abdomen may reflect cholecystectomy. Phleboliths in the pelvis.  Soft tissue planes and included osseous structures are nonsuspicious.  IMPRESSION: No acute cardiopulmonary process.  Recurrent high-grade small bowel obstruction.   Electronically Signed   By: Elon Alas   On: 08/04/2013 00:14         Subjective: Overall abdominal pain is getting better. The patient had a little nausea this morning without emesis. Denies any chest pain, shortness breath, vomiting, diarrhea. He is not passing flatus. No bowel movement yet. Abdominal pain is slowly getting better. No fevers or chills  Objective: Filed Vitals:   08/12/13 2159 08/13/13 0146 08/13/13 0612 08/13/13 1506  BP: 142/71 102/65 117/66 134/79  Pulse: 109 92 99 102  Temp: 97.4 F (36.3 C) 97.5 F (36.4 C) 97.2 F (36.2 C) 97.4 F (36.3 C)  TempSrc: Oral Oral Oral Oral  Resp: 16 14 16 16   Height:      Weight:   117.4 kg (258 lb 13.1 oz)   SpO2: 93% 94% 94% 93%    Intake/Output Summary (Last 24 hours) at 08/13/13 1526 Last data filed at 08/13/13 1507  Gross per 24 hour  Intake 3671.67 ml  Output   2700 ml  Net 971.67 ml   Weight change: 3.3 kg (7 lb 4.4 oz) Exam:   General:  Pt is alert, follows commands appropriately, not in acute distress  HEENT: No icterus, No thrush,  [place]/AT  Cardiovascular: RRR, S1/S2, no rubs, no gallops  Respiratory: CTA bilaterally, no wheezing, no crackles, no rhonchi  Abdomen: Soft/decreased BS, non tender, non distended, no guarding  Extremities: 1+LE edema, No lymphangitis, No petechiae, No rashes, no synovitis  Data Reviewed: Basic Metabolic Panel:  Recent Labs Lab 08/07/13 0522 08/09/13 0447 08/10/13 0407 08/11/13 0450 08/13/13 0616  NA 140 137 137 135* 138  K 4.0 4.4 4.2 4.3 4.6  CL 104 100 100 98 100  CO2 24 25 26 23 28   GLUCOSE 151* 244* 216* 166* 176*  BUN 21 24* 24* 23 24*  CREATININE 1.02 1.09 1.08 1.04 1.00  CALCIUM 8.8 8.6 8.6 8.4 8.5  MG 2.3  --  2.5  --  2.4  PHOS 3.0  --  3.4  --  2.8   Liver Function  Tests:  Recent Labs Lab 08/10/13 0407 08/13/13 0616  AST 27 147*  ALT 28 166*  ALKPHOS 84 71  BILITOT 0.2* 0.4  PROT 6.3 5.9*  ALBUMIN 2.9* 2.6*   No results found for this basename: LIPASE, AMYLASE,  in the last 168 hours No results found for this basename: AMMONIA,  in the last 168 hours CBC:  Recent Labs Lab 08/10/13 0407 08/11/13 0630  WBC 18.4* 22.6*  NEUTROABS 14.3*  --   HGB 11.2* 10.8*  HCT 36.6* 35.2*  MCV 80.1 79.1  PLT 426* 387   Cardiac Enzymes: No results found for this basename: CKTOTAL, CKMB, CKMBINDEX, TROPONINI,  in the last 168 hours BNP: No components found with this basename: POCBNP,  CBG:  Recent Labs Lab 08/12/13 1145 08/12/13 1748 08/12/13 2233 08/13/13 0614 08/13/13 1208  GLUCAP 201* 188* 168* 169* 179*    Recent Results (from the past 240 hour(s))  MRSA  PCR SCREENING     Status: None   Collection Time    08/04/13  2:26 AM      Result Value Ref Range Status   MRSA by PCR NEGATIVE  NEGATIVE Final   Comment:            The GeneXpert MRSA Assay (FDA     approved for NASAL specimens     only), is one component of a     comprehensive MRSA colonization     surveillance program. It is not     intended to diagnose MRSA     infection nor to guide or     monitor treatment for     MRSA infections.  URINE CULTURE     Status: None   Collection Time    08/04/13  6:31 PM      Result Value Ref Range Status   Specimen Description URINE, RANDOM   Final   Special Requests NONE   Final   Culture  Setup Time     Final   Value: 08/04/2013 19:34     Performed at [facility]     Final   Value: NO GROWTH     Performed at [facility]   Culture     Final   Value: NO GROWTH     Performed at [facility]   Report Status 08/05/2013 FINAL   Final  SURGICAL PCR SCREEN     Status: None   Collection Time    08/10/13 12:29 AM      Result Value Ref Range Status   MRSA, PCR NEGATIVE  NEGATIVE Final    Staphylococcus aureus NEGATIVE  NEGATIVE Final   Comment:            The Xpert SA Assay (FDA     approved for NASAL specimens     in patients over 80 years of age),     is one component of     a comprehensive surveillance     program.  Test performance has     been validated by [employer] for patients greater     than or equal to 28 year old.     It is not intended     to diagnose infection nor to     guide or monitor treatment.     Scheduled Meds: . chlorhexidine  15 mL Mouth Rinse BID  . ciprofloxacin  400 mg Intravenous Q12H  . dutasteride  0.5 mg Oral Daily  . heparin  5,000 Units Subcutaneous 3 times per day  . insulin aspart  0-15 Units Subcutaneous 4 times per day  . levothyroxine  87.5 mcg Intravenous Daily  . lisinopril  10 mg Oral Daily  . methylPREDNISolone (SOLU-MEDROL) injection  40 mg Intravenous Q12H  . metronidazole  500 mg Intravenous 4 times per day  . sodium chloride  3 mL Intravenous Q12H   Continuous Infusions: . sodium chloride 50 mL/hr at 08/12/13 2344  . sodium chloride    . Marland KitchenTPN (CLINIMIX-E) Adult 100 mL/hr at 08/12/13 1820   And  . fat emulsion 240 mL (08/12/13 1819)  . Marland KitchenTPN (CLINIMIX-E) Adult     And  . fat emulsion       Aerianna Losey, [place], DO  [facility] Pager [phone]  If 7PM-7AM, please contact night-coverage [email or web address] Password [email or web address] 08/13/2013, 3:26 PM   LOS: 10 days

## 2013-08-13 NOTE — Progress Notes (Signed)
NUTRITION FOLLOW UP  Intervention:    TPN per Pharmacy   Diet advancement per MD   RD to continue to monitor  Nutrition Dx:   Inadequate oral intake related to SBO as evidenced by NPO status; ongoing  Goal:   TPN to meet >/= 90% of their estimated nutrition needs; met  Monitor:   PO intake, weight trends, labs  Assessment:   63 y.o. year old male with prior hx/o crohns disease (s/p multiple bowel surgeries), HTN who presented with abdominal pain. Pt stated that he had severe sudden onset of abdominal pain, non bloody emesis and diarrhea while eating earlier on day of admission.   Pt s/p the following on 6/9:  EXPLORATORY LAPAROTOMY (N/A)  SMALL BOWEL RESECTION (N/A)  Pt has been NPO since surgery. Per MD note, NGT to clamp, but if nauseated place back on suction. No passing of flatus or bowel movements yet.   Pt reports feeling well and no concerns at this time.   Pt currently receiving TPN: Clinimix E 5/15 at 100 ml/hr and continue IVFE at 10 ml/hr. TPN to provide 2184 kCal (100% of estimated needs) and 120 gm (100% of estimated needs) of protein daily. Pt tolerating well.    Height: Ht Readings from Last 1 Encounters:  08/05/13 6' (1.829 m)    Weight Status:   Wt Readings from Last 1 Encounters:  08/13/13 258 lb 13.1 oz (117.4 kg)    Re-estimated needs:  Kcal: 2100-2300  Protein: 105-120 grams  Fluid: 2.7 L/day  Skin: generalized edema; intact   Diet Order:     Intake/Output Summary (Last 24 hours) at 08/13/13 1107 Last data filed at 08/13/13 0903  Gross per 24 hour  Intake 3871.67 ml  Output   3600 ml  Net 271.67 ml    Last BM: 6/8   Labs:   Recent Labs Lab 08/07/13 0522  08/10/13 0407 08/11/13 0450 08/13/13 0616  NA 140  < > 137 135* 138  K 4.0  < > 4.2 4.3 4.6  CL 104  < > 100 98 100  CO2 24  < > 26 23 28   BUN 21  < > 24* 23 24*  CREATININE 1.02  < > 1.08 1.04 1.00  CALCIUM 8.8  < > 8.6 8.4 8.5  MG 2.3  --  2.5  --  2.4  PHOS 3.0   --  3.4  --  2.8  GLUCOSE 151*  < > 216* 166* 176*  < > = values in this interval not displayed.  CBG (last 3)   Recent Labs  08/12/13 1748 08/12/13 2233 08/13/13 0614  GLUCAP 188* 168* 169*    Scheduled Meds: . chlorhexidine  15 mL Mouth Rinse BID  . ciprofloxacin  400 mg Intravenous Q12H  . dutasteride  0.5 mg Oral Daily  . heparin  5,000 Units Subcutaneous 3 times per day  . insulin aspart  0-15 Units Subcutaneous 4 times per day  . levothyroxine  87.5 mcg Intravenous Daily  . lisinopril  10 mg Oral Daily  . methylPREDNISolone (SOLU-MEDROL) injection  40 mg Intravenous Q12H  . metronidazole  500 mg Intravenous 4 times per day  . sodium chloride  3 mL Intravenous Q12H    Continuous Infusions: . sodium chloride 50 mL/hr at 08/12/13 2344  . sodium chloride    . Marland KitchenTPN (CLINIMIX-E) Adult 100 mL/hr at 08/12/13 1820   And  . fat emulsion 240 mL (08/12/13 1819)  . Marland KitchenTPN (CLINIMIX-E) Adult  And  . fat emulsion      Carrolyn Leigh, BS Dietetic Intern Pager: [phone]

## 2013-08-13 NOTE — Progress Notes (Signed)
Pt ok will try to clamp NGT but if nauseated place back on suction. Wean steroids if possible.

## 2013-08-13 NOTE — Progress Notes (Signed)
2 Days Post-Op  Subjective: He feels better walking some, ready to get foley out.  No flatus but he feels better.  Objective: Vital signs in last 24 hours: Temp:  [97.2 F (36.2 C)-98.1 F (36.7 C)] 97.2 F (36.2 C) (06/11 0612) Pulse Rate:  [75-109] 99 (06/11 0612) Resp:  [12-16] 16 (06/11 0612) BP: (102-142)/(65-75) 117/66 mmHg (06/11 0612) SpO2:  [93 %-95 %] 94 % (06/11 0612) Weight:  [117.4 kg (258 lb 13.1 oz)] 117.4 kg (258 lb 13.1 oz) (06/11 0612) Last BM Date: 08/10/13 150 PO recorded 450 from the NG 3550 urine output. NPO/tna Afebrile, VSS LFT's up some on TNA Intake/Output from previous day: 06/10 0701 - 06/11 0700 In: 3871.7 [P.O.:150; I.V.:1551.7; NG/GT:150; IV Piggyback:700; TPN:1320] Out: 4000 [Urine:3550; Emesis/NG output:450] Intake/Output this shift:    General appearance: alert, cooperative and no distress Resp: clear to auscultation bilaterally GI: soft, sore few hyper active BS.  Wound looks good.  no flatus  Lab Results:   Recent Labs  08/11/13 0630  WBC 22.6*  HGB 10.8*  HCT 35.2*  PLT 387    BMET  Recent Labs  08/11/13 0450 08/13/13 0616  NA 135* 138  K 4.3 4.6  CL 98 100  CO2 23 28  GLUCOSE 166* 176*  BUN 23 24*  CREATININE 1.04 1.00  CALCIUM 8.4 8.5   PT/INR No results found for this basename: LABPROT, INR,  in the last 72 hours   Recent Labs Lab 08/10/13 0407 08/13/13 0616  AST 27 147*  ALT 28 166*  ALKPHOS 84 71  BILITOT 0.2* 0.4  PROT 6.3 5.9*  ALBUMIN 2.9* 2.6*     Lipase     Component Value Date/Time   LIPASE 47 08/03/2013 2258     Studies/Results: No results found.  Medications: . chlorhexidine  15 mL Mouth Rinse BID  . ciprofloxacin  400 mg Intravenous Q12H  . dutasteride  0.5 mg Oral Daily  . heparin  5,000 Units Subcutaneous 3 times per day  . insulin aspart  0-15 Units Subcutaneous 4 times per day  . levothyroxine  87.5 mcg Intravenous Daily  . lisinopril  10 mg Oral Daily  . methylPREDNISolone  (SOLU-MEDROL) injection  40 mg Intravenous Q12H  . metronidazole  500 mg Intravenous 4 times per day  . sodium chloride  3 mL Intravenous Q12H   Prior to Admission medications   Medication Sig Start Date End Date Taking? Authorizing Provider  aspirin 81 MG tablet Take 81 mg by mouth daily.     Yes Historical Provider, MD  dutasteride (AVODART) 0.5 MG capsule Take 0.5 mg by mouth daily.     Yes Historical Provider, MD  levothyroxine (SYNTHROID, LEVOTHROID) 175 MCG tablet Take 175 mcg by mouth daily.     Yes Historical Provider, MD  lisinopril (PRINIVIL,ZESTRIL) 10 MG tablet Take 10 mg by mouth daily.   Yes Historical Provider, MD  mesalamine (PENTASA) 500 MG CR capsule Take 1,000 mg by mouth 4 (four) times daily.   Yes Historical Provider, MD  polyethylene glycol (MIRALAX / GLYCOLAX) packet Take 17 g by mouth daily.     Yes Historical Provider, MD    Assessment/Plan 1. Small bowel obstruction secondary to stricture from Crohn disease involving the terminal ileum. Exploratory laparotomy with ileocecectomy. Thomas A. Cornett, M.D. 08/11/2013.  2. S/p cholecystectomy, splenectomy, Prior intestinal perforation with diverticulitis and SBO 4/26-28/15, 1 episode 2010. No prior colon or small bowel resection.  3. Hx of acute respiratory failure with hypoxia/Sleep  apnea  4. Hypertension  5. Hypothyroid  6. Body mass index is 33.8  7. Hx of BPH    Plan:  Start clamping NG, remove foley,  Restart Avodart with sips. Continue to mobilize.  If he does well with NG clamping and passes some flatus we can pull tube and start clears.  Appreciate Dr. Carles Collet, Dr Benson Norway and Dr. Lorie Apley assistance.  LOS: 10 days    Bruce [name] 08/13/2013

## 2013-08-14 LAB — HEPATIC FUNCTION PANEL
ALT: 118 U/L — ABNORMAL HIGH (ref 0–53)
AST: 59 U/L — ABNORMAL HIGH (ref 0–37)
Albumin: 2.4 g/dL — ABNORMAL LOW (ref 3.5–5.2)
Alkaline Phosphatase: 65 U/L (ref 39–117)
Bilirubin, Direct: <0.2 mg/dL (ref 0.0–0.3)
Total Bilirubin: 0.3 mg/dL (ref 0.3–1.2)
Total Protein: 5.7 g/dL — ABNORMAL LOW (ref 6.0–8.3)

## 2013-08-14 LAB — BASIC METABOLIC PANEL
BUN: 27 mg/dL — ABNORMAL HIGH (ref 6–23)
CO2: 27 mEq/L (ref 19–32)
Calcium: 8.5 mg/dL (ref 8.4–10.5)
Chloride: 100 mEq/L (ref 96–112)
Creatinine, Ser: 1 mg/dL (ref 0.50–1.35)
GFR calc Af Amer: 90 mL/min — ABNORMAL LOW (ref >90)
GFR calc non Af Amer: 78 mL/min — ABNORMAL LOW (ref >90)
Glucose, Bld: 195 mg/dL — ABNORMAL HIGH (ref 70–99)
Potassium: 4.6 mEq/L (ref 3.7–5.3)
Sodium: 137 mEq/L (ref 137–147)

## 2013-08-14 LAB — GLUCOSE, CAPILLARY
Glucose-Capillary: 146 mg/dL — ABNORMAL HIGH (ref 70–99)
Glucose-Capillary: 182 mg/dL — ABNORMAL HIGH (ref 70–99)
Glucose-Capillary: 196 mg/dL — ABNORMAL HIGH (ref 70–99)
Glucose-Capillary: 231 mg/dL — ABNORMAL HIGH (ref 70–99)
Glucose-Capillary: 248 mg/dL — ABNORMAL HIGH (ref 70–99)

## 2013-08-14 MED ORDER — ACETAMINOPHEN 160 MG/5ML PO SOLN: 650.0000 mg | ORAL | Status: DC | PRN

## 2013-08-14 MED ORDER — OXYCODONE HCL 5 MG/5ML PO SOLN
5.0000 mg | ORAL | Status: DC | PRN
  Administered 2013-08-14 – 2013-08-16 (×3): 10 mg via ORAL
  Filled 2013-08-14 (×3): qty 10

## 2013-08-14 MED ORDER — TRACE MINERALS CR-CU-F-FE-I-MN-MO-SE-ZN IV SOLN
INTRAVENOUS | Status: AC
  Administered 2013-08-14: 17:00:00 via INTRAVENOUS
  Filled 2013-08-14: qty 3000

## 2013-08-14 MED ORDER — METHYLPREDNISOLONE SODIUM SUCC 40 MG IJ SOLR
40.0000 mg | Freq: Every day | INTRAMUSCULAR | Status: DC
  Administered 2013-08-15: 40 mg via INTRAVENOUS
  Filled 2013-08-14: qty 1

## 2013-08-14 MED ORDER — FAT EMULSION 20 % IV EMUL
240.0000 mL | INTRAVENOUS | Status: AC
  Administered 2013-08-14: 240 mL via INTRAVENOUS
  Filled 2013-08-14: qty 250

## 2013-08-14 NOTE — Progress Notes (Signed)
Subjective: Feeling well, but no bowel movement or flatus yet.  Objective: Vital signs in last 24 hours: Temp:  [97.4 F (36.3 C)-97.6 F (36.4 C)] 97.6 F (36.4 C) (06/12 0550) Pulse Rate:  [87-102] 92 (06/12 0550) Resp:  [16-18] 18 (06/12 0550) BP: (120-134)/(76-90) 120/76 mmHg (06/12 0550) SpO2:  [93 %-98 %] 98 % (06/12 0550) Weight:  [246 lb 14.6 oz (112 kg)] 246 lb 14.6 oz (112 kg) (06/12 0550) Last BM Date: 08/10/13  Intake/Output from previous day: 06/11 0701 - 06/12 0700 In: 3503.2 [P.O.:150; I.V.:713.2; TPN:2640] Out: 1275 [Urine:3325; Emesis/NG output:50] Intake/Output this shift:    General appearance: alert and no distress GI: Obese, soft, nontender  Lab Results: No results found for this basename: WBC, HGB, HCT, PLT,  in the last 72 hours BMET  Recent Labs  08/13/13 0616 08/14/13 0500  NA 138 137  K 4.6 4.6  CL 100 100  CO2 28 27  GLUCOSE 176* 195*  BUN 24* 27*  CREATININE 1.00 1.00  CALCIUM 8.5 8.5   LFT  Recent Labs  08/14/13 0500  PROT 5.7*  ALBUMIN 2.4*  AST 59*  ALT 118*  ALKPHOS 65  BILITOT 0.3  BILIDIR <0.2  IBILI NOT CALCULATED   PT/INR No results found for this basename: LABPROT, INR,  in the last 72 hours Hepatitis Panel No results found for this basename: HEPBSAG, HCVAB, HEPAIGM, HEPBIGM,  in the last 72 hours C-Diff No results found for this basename: CDIFFTOX,  in the last 72 hours Fecal Lactopherrin No results found for this basename: FECLLACTOFRN,  in the last 72 hours  Studies/Results: US Abdomen Limited Ruq  08/13/2013   CLINICAL DATA:  Elevated transaminases.  Status postcholecystectomy.  EXAM: US ABDOMEN LIMITED - RIGHT UPPER QUADRANT  COMPARISON:  None.  FINDINGS: Gallbladder:  Absent consistent with given history.  Common bile duct:  Diameter: 7 mm in caliber.  Liver:  No focal lesion identified. Within normal limits in parenchymal echogenicity.  IMPRESSION: No acute abdominal pathology.   Electronically Signed    By: Maryclare Bean M.D.   On: 08/13/2013 18:03    Medications:  Scheduled: . chlorhexidine  15 mL Mouth Rinse BID  . ciprofloxacin  400 mg Intravenous Q12H  . dutasteride  0.5 mg Oral Daily  . heparin  5,000 Units Subcutaneous 3 times per day  . insulin aspart  0-15 Units Subcutaneous 4 times per day  . levothyroxine  87.5 mcg Intravenous Daily  . lisinopril  10 mg Oral Daily  . methylPREDNISolone (SOLU-MEDROL) injection  60 mg Intravenous Daily  . metronidazole  500 mg Intravenous 4 times per day  . sodium chloride  3 mL Intravenous Q12H   Continuous: . sodium chloride 10 mL/hr at 08/13/13 1841  . Marland KitchenTPN (CLINIMIX-E) Adult 100 mL/hr at 08/13/13 1800   And  . 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   The patient's small bowel resection was negative for Crohn's disease.  This is surprising as the working diagnosis was Crohn's disease as reported by Dr. [name].  This is good news for the patient and he should not need any further treatment beyond this surgical resection.  Plan: 1) Taper solumedrol.  Taper down to 40 mg tomorrow, 20 mg on Sunday, 10 mg on Monday, and then stop.  If he regains bowel function prednisone can be substituted.  I think he can tolerate a rapid taper.   LOS: 11 days  Bruce [name] D 08/14/2013, 8:20 AM

## 2013-08-14 NOTE — Progress Notes (Signed)
PARENTERAL NUTRITION CONSULT NOTE - FOLLOW UP  Pharmacy Consult:  TPN Indication: Crohn's ileitis / SBO  Allergies  Allergen Reactions  . Coconut Flavor Other (See Comments)    Does not eat because of crohn's     Patient Measurements: Height: 6' (182.9 cm) Weight: 246 lb 14.6 oz (112 kg) IBW/kg (Calculated) : 77.6  Vital Signs: Temp: 97.6 F (36.4 C) (06/12 0550) Temp src: Oral (06/12 0550) BP: 120/76 mmHg (06/12 0550) Pulse Rate: 92 (06/12 0550) Intake/Output from previous day: 06/11 0701 - 06/12 0700 In: 3503.2 [P.O.:150; I.V.:713.2; TPN:2640] Out: 3235 [Urine:3325; Emesis/NG output:50]  Labs: No results found for this basename: WBC, HGB, HCT, PLT, APTT, INR,  in the last 72 hours   Recent Labs  08/13/13 0616 08/14/13 0500  NA 138 137  K 4.6 4.6  CL 100 100  CO2 28 27  GLUCOSE 176* 195*  BUN 24* 27*  CREATININE 1.00 1.00  CALCIUM 8.5 8.5  MG 2.4  --   PHOS 2.8  --   PROT 5.9* 5.7*  ALBUMIN 2.6* 2.4*  AST 147* 59*  ALT 166* 118*  ALKPHOS 71 65  BILITOT 0.4 0.3  BILIDIR  --  <0.2  IBILI  --  NOT CALCULATED   Estimated Creatinine Clearance: 96.5 ml/min (by C-G formula based on Cr of 1).    Recent Labs  08/13/13 1836 08/14/13 0002 08/14/13 0603  GLUCAP 141* 248* 182*   Insulin Requirements in the past 12 hours:  10 units Novolog SSI and 75 units regular insulin in TPN  Current Nutrition:  Clinimix E 5/15 100 ml/hr and 20% IVFE at 10 ml/hr.  This provides 2184 kCal and 120 gm of protein daily  Nutritional Goals:  2100-2300 kCal,105-120 grams of protein per day  Assessment:  89 YOM with history of bowel obstruction a few weeks ago presented on 08/03/13 with complaints of severe abdominal cramping, abdominal distention, and vomiting that started several hours prior to arrival. He has a history of Crohn's disease, obesity, and multiple abdominal surgeries with adhesions and recurrent small bowel obstructions. Last admission was in April of 2015 for  pSBO.  Pharmacy managing TPN for nutritional support.  GI: Crohn's ileitis / SBO / unspecified constipation / obesity.  GI started steroids for significant underlying fibrosis.  6/2 CT: consistent with Crohn's flare, likely has a chronic underlying stricture. S/p exp lap with ileocecectomy 6/9. Pt tolerated NG tube clamped  Pathology report does not show Chron's   IV pepcid  TG 64. Prealbumin WNL at 30.5.   Endo: hypothyroid on IV Synthroid, TSH WNL.  No hx DM - CBGs 141-248 on SSI and 75 units regular insulin in the TPN.  Lytes: Na 137, K 4.6   Renal: CKD3 / BPH - SCr 1.0- UOP 1.3 ml/kg/hr. MIVF NS at [place]: OSA - RA  Cards: HTN - BP nl, HR 92. PRN hydralazine lisinopril  Hepatobil: AST/ALT elevated  ID: Cipro/Flagyl D#10 (6/2 >> ) for GI coverage.  Afebrile, WBC elevated and trending up (pt also on steroids).  UCx negative.  Best Practices: SQ heparin  TPN Access: PICC placed 6/3  TPN day#: 9 (6/3 >> )  Plan:  - Continue Clinimix E 5/15 at goal rate of 164m/hr and continue 20% IVFE at 10 ml/hr.  This meets 100% of estimated kcal and protein needs. - Daily multivitamin and trace elements - Cont pepcid in TPN - Continue SSI moderate Q6H. Increase regular insulin in TPN to 80 units per 3L  bag (pt will receive ~65 units daily) - BMET in am - F/u ability to restart po diet  Excell Seltzer, PharmD Clinical pharmacist, pager [phone]  08/14/2013, 8:22 AM

## 2013-08-14 NOTE — Progress Notes (Signed)
3 Days Post-Op  Subjective: He feels better, no flatus, tolerated NG clamped all day from about noon on without distension or nausea.  Voiding without foley  Objective: Vital signs in last 24 hours: Temp:  [97.4 F (36.3 C)-97.6 F (36.4 C)] 97.6 F (36.4 C) (06/12 0550) Pulse Rate:  [87-102] 92 (06/12 0550) Resp:  [16-18] 18 (06/12 0550) BP: (120-134)/(76-90) 120/76 mmHg (06/12 0550) SpO2:  [93 %-98 %] 98 % (06/12 0550) Weight:  [112 kg (246 lb 14.6 oz)] 112 kg (246 lb 14.6 oz) (06/12 0550) Last BM Date: 08/10/13 150 PO 50 ml NG noted Afebrile, VSS LFT'S  Better US Abdomen RUQ yesterday:  No acute abdominal pathology.    Intake/Output from previous day: 06/11 0701 - 06/12 0700 In: 3503.2 [P.O.:150; I.V.:713.2; TPN:2640] Out: 9562 [Urine:3325; Emesis/NG output:50] Intake/Output this shift:    General appearance: alert, cooperative and no distress Resp: clear to auscultation bilaterally GI: soft sore, few hypoactive bowel sounds.  Incision looks fine.  Lab Results:  No results found for this basename: WBC, HGB, HCT, PLT,  in the last 72 hours  BMET  Recent Labs  08/13/13 0616 08/14/13 0500  NA 138 137  K 4.6 4.6  CL 100 100  CO2 28 27  GLUCOSE 176* 195*  BUN 24* 27*  CREATININE 1.00 1.00  CALCIUM 8.5 8.5   PT/INR No results found for this basename: LABPROT, INR,  in the last 72 hours   Recent Labs Lab 08/10/13 0407 08/13/13 0616 08/14/13 0500  AST 27 147* 59*  ALT 28 166* 118*  ALKPHOS 84 71 65  BILITOT 0.2* 0.4 0.3  PROT 6.3 5.9* 5.7*  ALBUMIN 2.9* 2.6* 2.4*     Lipase     Component Value Date/Time   LIPASE 47 08/03/2013 2258     Studies/Results: US Abdomen Limited Ruq  08/13/2013   CLINICAL DATA:  Elevated transaminases.  Status postcholecystectomy.  EXAM: US ABDOMEN LIMITED - RIGHT UPPER QUADRANT  COMPARISON:  None.  FINDINGS: Gallbladder:  Absent consistent with given history.  Common bile duct:  Diameter: 7 mm in caliber.  Liver:  No  focal lesion identified. Within normal limits in parenchymal echogenicity.  IMPRESSION: No acute abdominal pathology.   Electronically Signed   By: Maryclare Bean M.D.   On: 08/13/2013 18:03    Medications: . chlorhexidine  15 mL Mouth Rinse BID  . ciprofloxacin  400 mg Intravenous Q12H  . dutasteride  0.5 mg Oral Daily  . heparin  5,000 Units Subcutaneous 3 times per day  . insulin aspart  0-15 Units Subcutaneous 4 times per day  . levothyroxine  87.5 mcg Intravenous Daily  . lisinopril  10 mg Oral Daily  . methylPREDNISolone (SOLU-MEDROL) injection  60 mg Intravenous Daily  . metronidazole  500 mg Intravenous 4 times per day  . sodium chloride  3 mL Intravenous Q12H   . sodium chloride 10 mL/hr at 08/13/13 1841  . Marland KitchenTPN (CLINIMIX-E) Adult 100 mL/hr at 08/13/13 1800   And  . fat emulsion 240 mL (08/13/13 1800)    Small intestine, resection, terminal ileum and cecum - BENIGN SMALL BOWEL AND COLONIC MUCOSA WITH SUBMUCOSAL SMOOTH MUSCLE HYPERPLASIA. - FIVE BENIGN LYMPH NODES (0/5). - THE SURGICAL RESECTION MARGINS ARE HISTOLOGICALLY VIABLE.  Assessment/Plan 1. Small bowel obstruction secondary to stricture from Crohn disease involving the terminal ileum. Exploratory laparotomy with ileocecectomy. Thomas A. Cornett, M.D. 08/11/2013.  As noted above, path does not show Crohn's. 2. S/p cholecystectomy, splenectomy, Prior intestinal  perforation with diverticulitis and SBO 4/26-28/15, 1 episode 2010. No prior colon or small bowel resection.  3. Hx of acute respiratory failure with hypoxia/Sleep apnea  4. Hypertension  5. Hypothyroid  6. Body mass index is 33.8  7. Hx of BPH    Plan:  D/c ng, sips and chips for now. Wait on return of bowel function.  Continue TNA for now.  He has had 10 days of Cipro and Flagyl today.  Can we discontinue?      LOS: 11 days    Nekesha Font 08/14/2013

## 2013-08-14 NOTE — Progress Notes (Signed)
Adv slowly d/c abx

## 2013-08-14 NOTE — Progress Notes (Signed)
PROGRESS NOTE  Bruce [name] GGE:[id number] DOB: [birth year] DOA: 08/03/2013 PCP: [name], MD  Assessment/Plan: Crohn's ileitis /Small bowel obstruction  -d/c cipro and flagyl -continue IV solumedrol-->discussed with Dr. Liborio Nixon decreasing solumedrol  - he has a combination of acute Crohn's exacerbation in setting of fibrostenosing disease that likely will not respond to medication. GI recommended surgical resection.  -PICC placed and beganTNA . SBO improved on x ray.  -patient taken to OR 08/11/13 with laparoscopic small bowel resection.  -d/c PCA as pt is getting <70m Dilaudid over 24 hr period--discussed with surgery  -[place]ural [place] suggests post op pt will require anti-TNF or immunomodulator therapy to reduce the chance of recurrence at the anastamosis.  -08/13/13 NG clamped--appreciate surgery followup  -08/14/13 NG removed, still no BM or flatus -wean solumedrol Transaminasemia  -Likely due to TPN  -Right upper quadrant ultrasound--neg  -Continue to trend  Metabolic acidosis  -resolved  Acute respiratory failure with hypoxia/Sleep apnea  Resolved.  -likely from mechanical issues from recent distended abdomen and SBO  -cont O2  -CPAP per respiratory. pt refused saying does not use at home and actually gave away his machine.  Dehydration/tachycardia  -improved  Hypertension  -BP stable  -prn Hydralazine  -Continue lisinopril  Hypothyroidism  -continue IV Synthroid  Leukocytosis  Secondary to the Solu-Medrol.  -continue to wean steroids  -d/c antibiotics Family Communication: wife at beside  Disposition Plan: Home when medically stable           Procedures/Studies: X-ray Chest Pa And Lateral   08/05/2013   CLINICAL DATA:  diaphoresis  EXAM: CHEST  2 VIEW  COMPARISON:  Abdominal series dated 08/03/2013  FINDINGS: The heart size and mediastinal contours are within normal limits. Stable mild discoid atelectasis versus  scarring left lung base. Lungs otherwise clear. NG tube has been inserted tip not view of this study. No acute osseous abnormalities.  IMPRESSION: No active cardiopulmonary disease.   Electronically Signed   By: HMargaree MackintoshM.D.   On: 08/05/2013 09:32   Ct Abdomen Pelvis W Contrast  08/04/2013   CLINICAL DATA:  Abdominal pain. History of small bowel obstruction. History of Crohn's disease.  EXAM: CT ABDOMEN AND PELVIS WITH CONTRAST  TECHNIQUE: Multidetector CT imaging of the abdomen and pelvis was performed using the standard protocol following bolus administration of intravenous contrast.  CONTRAST:  1020mOMNIPAQUE IOHEXOL 300 MG/ML  SOLN  COMPARISON:  04/16/2013  FINDINGS: BODY WALL: Fatty umbilical and periumbilical hernias. Fatty enlargement of the bilateral inguinal canals.  LOWER CHEST: Mild atelectasis at the left base.  ABDOMEN/PELVIS:  Liver: No focal abnormality.  Biliary: Cholecystectomy.  Pancreas: Unremarkable.  Spleen: In place of the spleen, there are multiple homogeneously enhancing nodules throughout the peritoneal space, consistent with splenosis.  Adrenals: Unremarkable.  Kidneys and ureters: There are bilateral indeterminate renal cortical lesions. On the right, there is a 2 cm lesion in the posterior interpolar kidney that measures greater than water density at 50 Hounsfield units There is no definitive deenhancement. In the left kidney there are two somewhat ill-defined and mildly hyperdense lesions both measuring approximately 12 mm in the interpolar region.  Bladder: Unremarkable.  Reproductive: Mild prostate enlargement.  Bowel: Multiple segments of dilated and fluid-filled small bowel leading up to the terminal ileum where there is abrupt transition point in a region of wall thickening. The mucosa in this region is avidly enhancing. There is alternating narrow  and dilated segments of the terminal ileum. Small bowel obstruction causes reactive edema in the associated mesenteries and in  the neighboring peritoneal compartment. No evidence of bowel perforation or devascularization. Nasogastric tube tip in the distal ligament. No pericecal inflammation.  Retroperitoneum: No mass or adenopathy.  Peritoneum: Splenosis as above.  Vascular: No acute abnormality.  OSSEOUS: No acute abnormalities.  IMPRESSION: 1. Small bowel obstruction at the level of the terminal ileum where there is active Crohn's enteritis. There could be a superimposed stenosis at this level. 2. Small bilateral indeterminate renal lesions, complex cyst versus solid mass. Due to patient size, ultrasound would be challenging; recommend followup outpatient MRI (when the patient is able to cooperate with breathing instructions). 3. Splenosis.   Electronically Signed   By: Jorje Guild M.D.   On: 08/04/2013 05:26   Dg Abd 2 Views  08/09/2013   CLINICAL DATA:  Followup evaluation of small bowel obstruction.  EXAM: ABDOMEN - 2 VIEW  COMPARISON:  08/07/2013.  FINDINGS: Previously noted nasogastric tube has been removed. Surgical clips project over the right upper quadrant of the abdomen, compatible with prior cholecystectomy. Gas is noted throughout the colon. No distal rectal gas is identified at this time. No pathologic distention of small bowel. No pneumoperitoneum.  IMPRESSION: 1. Nonspecific, nonobstructive bowel gas pattern, as above. 2. No pneumoperitoneum.   Electronically Signed   By: Vinnie Langton M.D.   On: 08/09/2013 12:13   Dg Abd 2 Views  08/07/2013   CLINICAL DATA:  Follow up partial small bowel obstruction.  EXAM: ABDOMEN - 2 VIEW  COMPARISON:  08/06/2013  FINDINGS: Enteric tube remains in place with tip in the expected region of the distal stomach or duodenal bulb. Right upper quadrant surgical clips are again seen. Residual oral contrast material in the left colon does not appear significantly changed. A few small air-fluid levels are again seen in the right mid to upper abdomen, slightly less prominent than on the  prior study. There is otherwise a relative paucity of small bowel gas. No intraperitoneal free air is identified. Mild lumbar dextroscoliosis is again seen. Mild elevation the right hemidiaphragm does not appear significantly changed.  IMPRESSION: A few small air-fluid levels in the right abdomen, slightly less prominent than on the prior study and suggestive of improving partial small bowel obstruction.   Electronically Signed   By: Logan Bores   On: 08/07/2013 08:34   Dg Abd 2 Views  08/06/2013   CLINICAL DATA:  Evaluate for small bowel obstruction.  EXAM: ABDOMEN - 2 VIEW  COMPARISON:  08/04/2013 and 08/03/2013  FINDINGS: Nasogastric tube tip is near the duodenum bulb region. There is oral contrast in the left colon. Few gas-filled loops of bowel in the mid abdomen. Few air-fluid levels in the right upper abdomen. No evidence for free air.  IMPRESSION: The degree of small bowel obstruction has decreased since 08/03/2013. Oral contrast has advanced into the left colon. There continues to be a few gas-filled loops of small bowel and findings probably represent a partial small bowel obstruction.   Electronically Signed   By: Markus Daft M.D.   On: 08/06/2013 08:37   Dg Abd Acute W/chest  08/04/2013   CLINICAL DATA:  Abdominal pain and vomiting, history of small bowel obstruction.  EXAM: ACUTE ABDOMEN SERIES (ABDOMEN 2 VIEW & CHEST 1 VIEW)  COMPARISON:  Abdominal radiograph June 29, 2013.  FINDINGS: Cardiomediastinal silhouette is unremarkable. Strandy densities in left lung base. No pleural effusions. No pneumothorax.  Soft tissue planes and included osseous structures are nonsuspicious.  Gas distended small bowel to 4.4 cm with paucity of large bowel gas. Small bowel air-fluid levels at relatively uniform level on upright view. No intraperitoneal free air.  No intra-abdominal mass effect or pathologic calcifications. Surgical clips in the right abdomen may reflect cholecystectomy. Phleboliths in the pelvis.  Soft tissue planes and included osseous structures are nonsuspicious.  IMPRESSION: No acute cardiopulmonary process.  Recurrent high-grade small bowel obstruction.   Electronically Signed   By: Elon Alas   On: 08/04/2013 00:14   US Abdomen Limited Ruq  08/13/2013   CLINICAL DATA:  Elevated transaminases.  Status postcholecystectomy.  EXAM: US ABDOMEN LIMITED - RIGHT UPPER QUADRANT  COMPARISON:  None.  FINDINGS: Gallbladder:  Absent consistent with given history.  Common bile duct:  Diameter: 7 mm in caliber.  Liver:  No focal lesion identified. Within normal limits in parenchymal echogenicity.  IMPRESSION: No acute abdominal pathology.   Electronically Signed   By: Maryclare Bean M.D.   On: 08/13/2013 18:03         Subjective: Patient feels that his abdomen is smaller without any further distention. Denies any fevers, chills, chest pain, shortness breath, vomiting, worsening abdominal pain. He has some abdominal cramping. Still not passing flatus nor has he had a bowel movement.  Objective: Filed Vitals:   08/13/13 1506 08/13/13 2210 08/14/13 0550 08/14/13 1529  BP: 134/79 132/90 120/76 122/80  Pulse: 102 87 92 88  Temp: 97.4 F (36.3 C) 97.4 F (36.3 C) 97.6 F (36.4 C) 97.8 F (36.6 C)  TempSrc: Oral Oral Oral Oral  Resp: 16 18 18 18   Height:      Weight:   112 kg (246 lb 14.6 oz)   SpO2: 93% 94% 98% 97%    Intake/Output Summary (Last 24 hours) at 08/14/13 1928 Last data filed at 08/14/13 1736  Gross per 24 hour  Intake 1508.16 ml  Output   3125 ml  Net -1616.84 ml   Weight change: -5.4 kg (-11 lb 14.5 oz) Exam:   General:  Pt is alert, follows commands appropriately, not in acute distress  HEENT: No icterus, No thrush, [place]/AT  Cardiovascular: RRR, S1/S2, no rubs, no gallops  Respiratory: CTA bilaterally, no wheezing, no crackles, no rhonchi  Abdomen: Soft/+BS, non tender, non distended, no guarding  Extremities: trace LE edema, No lymphangitis, No petechiae,  No rashes, no synovitis  Data Reviewed: Basic Metabolic Panel:  Recent Labs Lab 08/09/13 0447 08/10/13 0407 08/11/13 0450 08/13/13 0616 08/14/13 0500  NA 137 137 135* 138 137  K 4.4 4.2 4.3 4.6 4.6  CL 100 100 98 100 100  CO2 25 26 23 28 27   GLUCOSE 244* 216* 166* 176* 195*  BUN 24* 24* 23 24* 27*  CREATININE 1.09 1.08 1.04 1.00 1.00  CALCIUM 8.6 8.6 8.4 8.5 8.5  MG  --  2.5  --  2.4  --   PHOS  --  3.4  --  2.8  --    Liver Function Tests:  Recent Labs Lab 08/10/13 0407 08/13/13 0616 08/14/13 0500  AST 27 147* 59*  ALT 28 166* 118*  ALKPHOS 84 71 65  BILITOT 0.2* 0.4 0.3  PROT 6.3 5.9* 5.7*  ALBUMIN 2.9* 2.6* 2.4*   No results found for this basename: LIPASE, AMYLASE,  in the last 168 hours No results found for this basename: AMMONIA,  in the last 168 hours CBC:  Recent Labs Lab 08/10/13 0407  08/11/13 0630  WBC 18.4* 22.6*  NEUTROABS 14.3*  --   HGB 11.2* 10.8*  HCT 36.6* 35.2*  MCV 80.1 79.1  PLT 426* 387   Cardiac Enzymes: No results found for this basename: CKTOTAL, CKMB, CKMBINDEX, TROPONINI,  in the last 168 hours BNP: No components found with this basename: POCBNP,  CBG:  Recent Labs Lab 08/14/13 0002 08/14/13 0603 08/14/13 1227 08/14/13 1707 08/14/13 1750  GLUCAP 248* 182* 146* 196* 231*    Recent Results (from the past 240 hour(s))  SURGICAL PCR SCREEN     Status: None   Collection Time    08/10/13 12:29 AM      Result Value Ref Range Status   MRSA, PCR NEGATIVE  NEGATIVE Final   Staphylococcus aureus NEGATIVE  NEGATIVE Final   Comment:            The Xpert SA Assay (FDA     approved for NASAL specimens     in patients over 19 years of age),     is one component of     a comprehensive surveillance     program.  Test performance has     been validated by [employer] for patients greater     than or equal to 60 year old.     It is not intended     to diagnose infection nor to     guide or monitor treatment.      Scheduled Meds: . chlorhexidine  15 mL Mouth Rinse BID  . dutasteride  0.5 mg Oral Daily  . heparin  5,000 Units Subcutaneous 3 times per day  . insulin aspart  0-15 Units Subcutaneous 4 times per day  . levothyroxine  87.5 mcg Intravenous Daily  . lisinopril  10 mg Oral Daily  . [START ON 08/15/2013] methylPREDNISolone (SOLU-MEDROL) injection  40 mg Intravenous Daily  . sodium chloride  3 mL Intravenous Q12H   Continuous Infusions: . sodium chloride 10 mL/hr at 08/13/13 1841  . Marland KitchenTPN (CLINIMIX-E) Adult 100 mL/hr at 08/14/13 1728   And  . fat emulsion 240 mL (08/14/13 1728)     Siya Flurry, [place], DO  [facility] Pager [phone]  If 7PM-7AM, please contact night-coverage [email or web address] Password [email or web address] 08/14/2013, 7:28 PM   LOS: 11 days

## 2013-08-15 LAB — BASIC METABOLIC PANEL
BUN: 31 mg/dL — ABNORMAL HIGH (ref 6–23)
CO2: 28 mEq/L (ref 19–32)
Calcium: 8.5 mg/dL (ref 8.4–10.5)
Chloride: 97 mEq/L (ref 96–112)
Creatinine, Ser: 1.04 mg/dL (ref 0.50–1.35)
GFR calc Af Amer: 86 mL/min — ABNORMAL LOW (ref >90)
GFR calc non Af Amer: 74 mL/min — ABNORMAL LOW (ref >90)
Glucose, Bld: 158 mg/dL — ABNORMAL HIGH (ref 70–99)
Potassium: 4.3 mEq/L (ref 3.7–5.3)
Sodium: 135 mEq/L — ABNORMAL LOW (ref 137–147)

## 2013-08-15 LAB — GLUCOSE, CAPILLARY
Glucose-Capillary: 117 mg/dL — ABNORMAL HIGH (ref 70–99)
Glucose-Capillary: 138 mg/dL — ABNORMAL HIGH (ref 70–99)
Glucose-Capillary: 148 mg/dL — ABNORMAL HIGH (ref 70–99)
Glucose-Capillary: 152 mg/dL — ABNORMAL HIGH (ref 70–99)
Glucose-Capillary: 201 mg/dL — ABNORMAL HIGH (ref 70–99)

## 2013-08-15 MED ORDER — FAT EMULSION 20 % IV EMUL
240.0000 mL | INTRAVENOUS | Status: AC
  Administered 2013-08-15: 240 mL via INTRAVENOUS
  Filled 2013-08-15: qty 250

## 2013-08-15 MED ORDER — TRACE MINERALS CR-CU-F-FE-I-MN-MO-SE-ZN IV SOLN
INTRAVENOUS | Status: AC
  Administered 2013-08-15: 18:00:00 via INTRAVENOUS
  Filled 2013-08-15: qty 3000

## 2013-08-15 MED ORDER — PREDNISONE 20 MG PO TABS
20.0000 mg | ORAL_TABLET | Freq: Every day | ORAL | Status: DC
  Administered 2013-08-16: 20 mg via ORAL
  Filled 2013-08-15 (×2): qty 1

## 2013-08-15 NOTE — Progress Notes (Signed)
4 Days Post-Op  Subjective: He is walking all the time, still no flatus.  He's doing fine with sips and chips.  He wants to know if he can have some coffee.  Objective: Vital signs in last 24 hours: Temp:  [97.8 F (36.6 C)-99.2 F (37.3 C)] 99.2 F (37.3 C) (06/13 0528) Pulse Rate:  [85-99] 85 (06/13 0528) Resp:  [18] 18 (06/13 0528) BP: (98-135)/(63-94) 135/94 mmHg (06/13 0528) SpO2:  [95 %-100 %] 100 % (06/13 0528) Last BM Date: 08/10/13 175 PO sips and chips Tm 99.2 VSS Glucose up some Na 135 TNA Intake/Output from previous day: 06/12 0701 - 06/13 0700 In: 7619 [P.O.:175; TPN:1320] Out: 2375 [Urine:2375] Intake/Output this shift:    General appearance: alert, cooperative and no distress Resp: clear to auscultation bilaterally GI: soft, hypoactive BS, no flatus he know of and no BM so far.  Lab Results:  No results found for this basename: WBC, HGB, HCT, PLT,  in the last 72 hours  BMET  Recent Labs  08/14/13 0500 08/15/13 0529  NA 137 135*  K 4.6 4.3  CL 100 97  CO2 27 28  GLUCOSE 195* 158*  BUN 27* 31*  CREATININE 1.00 1.04  CALCIUM 8.5 8.5   PT/INR No results found for this basename: LABPROT, INR,  in the last 72 hours   Recent Labs Lab 08/10/13 0407 08/13/13 0616 08/14/13 0500  AST 27 147* 59*  ALT 28 166* 118*  ALKPHOS 84 71 65  BILITOT 0.2* 0.4 0.3  PROT 6.3 5.9* 5.7*  ALBUMIN 2.9* 2.6* 2.4*     Lipase     Component Value Date/Time   LIPASE 47 08/03/2013 2258     Studies/Results: US Abdomen Limited Ruq  08/13/2013   CLINICAL DATA:  Elevated transaminases.  Status postcholecystectomy.  EXAM: US ABDOMEN LIMITED - RIGHT UPPER QUADRANT  COMPARISON:  None.  FINDINGS: Gallbladder:  Absent consistent with given history.  Common bile duct:  Diameter: 7 mm in caliber.  Liver:  No focal lesion identified. Within normal limits in parenchymal echogenicity.  IMPRESSION: No acute abdominal pathology.   Electronically Signed   By: Maryclare Bean M.D.   On:  08/13/2013 18:03    Medications: . chlorhexidine  15 mL Mouth Rinse BID  . dutasteride  0.5 mg Oral Daily  . heparin  5,000 Units Subcutaneous 3 times per day  . insulin aspart  0-15 Units Subcutaneous 4 times per day  . levothyroxine  87.5 mcg Intravenous Daily  . lisinopril  10 mg Oral Daily  . methylPREDNISolone (SOLU-MEDROL) injection  40 mg Intravenous Daily  . sodium chloride  3 mL Intravenous Q12H    Small intestine, resection, terminal ileum and cecum  - BENIGN SMALL BOWEL AND COLONIC MUCOSA WITH SUBMUCOSAL SMOOTH MUSCLE  HYPERPLASIA.  - FIVE BENIGN LYMPH NODES (0/5).  - THE SURGICAL RESECTION MARGINS ARE HISTOLOGICALLY VIABLE.  Assessment/Plan  1. Small bowel obstruction secondary to stricture from Crohn disease involving the terminal ileum. Exploratory laparotomy with ileocecectomy. Thomas A. Cornett, M.D. 08/11/2013.  As noted above, path does not show Crohn's.  2. S/p cholecystectomy, splenectomy, Prior intestinal perforation with diverticulitis and SBO 4/26-28/15, 1 episode 2010. No prior colon or small bowel resection.  3. Hx of acute respiratory failure with hypoxia/Sleep apnea  4. Hypertension  5. Hypothyroid  6. Body mass index is 33.8  7. Hx of BPH   POD#4 I will give him some clears and see how he does just a matter of time  for him to open up.      LOS: 12 days    Florita Nitsch 08/15/2013

## 2013-08-15 NOTE — Progress Notes (Signed)
I have seen and examined the pt and agree with PA-Jenning's progress note. Devers for clears

## 2013-08-15 NOTE — Progress Notes (Signed)
Still not passing flatus despite clear liquids- mostly warm liquids. Patient's abdomen getting slightly distended. Instructed him to back off the liquids for a while. Told him to turn onto his left side to see if he can pass flatus. Melford Aase, RN

## 2013-08-15 NOTE — Progress Notes (Addendum)
PROGRESS NOTE  Bruce [name] DSK:[id number] DOB: April 03, 1950 DOA: 08/03/2013 PCP: [name], MD  Brief history 63 y.o. year old male with prior hx/o crohns disease (s/p multiple bowel surgeries), Prior intestipal perforation with diverticulitis and SBO 4/26-28/15 (1 episode 2010),  HTN who presented with worsening abdominal pain. Pt stated that he had severe sudden onset of abdominal pain, non bloody emesis and diarrhea while eating on day of admission.Recently admission 1 month prior for high grade SBO treated conservatively with NG decompression. Surgery recommended non operative management. Pt/wife felt that pain was significantly worse than previous admission, though pain much improved s/p NGT in ER. In the ER he was found to have tachycardia with rates in the 120s. WBC 14.5. Hgb 12.4. Cr 1.34. KUB w/ high grade SBO. Lactate 2.3. Lipase WNL. Patient admitted to stepdown and transferred to floor once stable. The patient was started on TPN with continued NG decompression. He was ultimately taken to the OR on 08/11/2013 for a laparoscopic small bowel resection. The patient remained on TPN as his bowel function continued to be slow to return even after the surgery. Gastroenterology was consulted and recommended treatment with steroids which have been weaned.   Assessment/Plan: Crohn's ileitis /Small bowel obstruction  -d/c cipro and flagyl (had 10 days) -continue IV solumedrol-->discussed with Dr. Liborio Nixon decreasing solumedrol  - he has a combination of acute Crohn's exacerbation in setting of fibrostenosing disease that likely will not respond to medication. GI recommended surgical resection.  -PICC placed and beganTNA . SBO improved on x ray.  -patient taken to OR 08/11/13 with laparoscopic small bowel resection.  -d/c PCA as pt is getting <73m Dilaudid over 24 hr period--discussed with surgery  -[place] suggests post op pt will require anti-TNF  or immunomodulator therapy to reduce the chance of recurrence at the anastamosis.  -08/13/13 NG clamped--appreciate surgery followup  -08/14/13 NG removed, still no BM or flatus  -wean solumedrol  Transaminasemia  -Likely due to TPN  -Right upper quadrant ultrasound--neg  -Continue to trend  Metabolic acidosis  -resolved  Acute respiratory failure with hypoxia/Sleep apnea  Resolved.  -likely from mechanical issues from recent distended abdomen and SBO  -cont O2  -CPAP per respiratory. pt refused saying does not use at home and actually gave away his machine.  Dehydration/tachycardia  -improved  Hypertension  -BP stable  -prn Hydralazine  -Continue lisinopril  Hypothyroidism  -continue IV Synthroid  Leukocytosis  -Secondary to the Solu-Medrol.  -continue to wean steroids to prednisone  -d/c antibiotics -no fevers, hemodynamically stable Hyperglycemia with hx of impaired glucose intolerance -HbA1C  -Continue NovoLog sliding scale Family Communication: wife at beside  Disposition Plan: Home when medically stable        Procedures/Studies: X-ray Chest Pa And Lateral   08/05/2013   CLINICAL DATA:  diaphoresis  EXAM: CHEST  2 VIEW  COMPARISON:  Abdominal series dated 08/03/2013  FINDINGS: The heart size and mediastinal contours are within normal limits. Stable mild discoid atelectasis versus scarring left lung base. Lungs otherwise clear. NG tube has been inserted tip not view of this study. No acute osseous abnormalities.  IMPRESSION: No active cardiopulmonary disease.   Electronically Signed   By: HMargaree MackintoshM.D.   On: 08/05/2013 09:32   Ct Abdomen Pelvis W Contrast  08/04/2013   CLINICAL DATA:  Abdominal pain. History of small bowel obstruction. History of Crohn's disease.  EXAM: CT ABDOMEN  AND PELVIS WITH CONTRAST  TECHNIQUE: Multidetector CT imaging of the abdomen and pelvis was performed using the standard protocol following bolus administration of intravenous contrast.   CONTRAST:  174m OMNIPAQUE IOHEXOL 300 MG/ML  SOLN  COMPARISON:  04/16/2013  FINDINGS: BODY WALL: Fatty umbilical and periumbilical hernias. Fatty enlargement of the bilateral inguinal canals.  LOWER CHEST: Mild atelectasis at the left base.  ABDOMEN/PELVIS:  Liver: No focal abnormality.  Biliary: Cholecystectomy.  Pancreas: Unremarkable.  Spleen: In place of the spleen, there are multiple homogeneously enhancing nodules throughout the peritoneal space, consistent with splenosis.  Adrenals: Unremarkable.  Kidneys and ureters: There are bilateral indeterminate renal cortical lesions. On the right, there is a 2 cm lesion in the posterior interpolar kidney that measures greater than water density at 50 Hounsfield units There is no definitive deenhancement. In the left kidney there are two somewhat ill-defined and mildly hyperdense lesions both measuring approximately 12 mm in the interpolar region.  Bladder: Unremarkable.  Reproductive: Mild prostate enlargement.  Bowel: Multiple segments of dilated and fluid-filled small bowel leading up to the terminal ileum where there is abrupt transition point in a region of wall thickening. The mucosa in this region is avidly enhancing. There is alternating narrow and dilated segments of the terminal ileum. Small bowel obstruction causes reactive edema in the associated mesenteries and in the neighboring peritoneal compartment. No evidence of bowel perforation or devascularization. Nasogastric tube tip in the distal ligament. No pericecal inflammation.  Retroperitoneum: No mass or adenopathy.  Peritoneum: Splenosis as above.  Vascular: No acute abnormality.  OSSEOUS: No acute abnormalities.  IMPRESSION: 1. Small bowel obstruction at the level of the terminal ileum where there is active Crohn's enteritis. There could be a superimposed stenosis at this level. 2. Small bilateral indeterminate renal lesions, complex cyst versus solid mass. Due to patient size, ultrasound would be  challenging; recommend followup outpatient MRI (when the patient is able to cooperate with breathing instructions). 3. Splenosis.   Electronically Signed   By: JJorje GuildM.D.   On: 08/04/2013 05:26   Dg Abd 2 Views  08/09/2013   CLINICAL DATA:  Followup evaluation of small bowel obstruction.  EXAM: ABDOMEN - 2 VIEW  COMPARISON:  08/07/2013.  FINDINGS: Previously noted nasogastric tube has been removed. Surgical clips project over the right upper quadrant of the abdomen, compatible with prior cholecystectomy. Gas is noted throughout the colon. No distal rectal gas is identified at this time. No pathologic distention of small bowel. No pneumoperitoneum.  IMPRESSION: 1. Nonspecific, nonobstructive bowel gas pattern, as above. 2. No pneumoperitoneum.   Electronically Signed   By: DVinnie LangtonM.D.   On: 08/09/2013 12:13   Dg Abd 2 Views  08/07/2013   CLINICAL DATA:  Follow up partial small bowel obstruction.  EXAM: ABDOMEN - 2 VIEW  COMPARISON:  08/06/2013  FINDINGS: Enteric tube remains in place with tip in the expected region of the distal stomach or duodenal bulb. Right upper quadrant surgical clips are again seen. Residual oral contrast material in the left colon does not appear significantly changed. A few small air-fluid levels are again seen in the right mid to upper abdomen, slightly less prominent than on the prior study. There is otherwise a relative paucity of small bowel gas. No intraperitoneal free air is identified. Mild lumbar dextroscoliosis is again seen. Mild elevation the right hemidiaphragm does not appear significantly changed.  IMPRESSION: A few small air-fluid levels in the right abdomen, slightly less prominent than on the  prior study and suggestive of improving partial small bowel obstruction.   Electronically Signed   By: Logan Bores   On: 08/07/2013 08:34   Dg Abd 2 Views  08/06/2013   CLINICAL DATA:  Evaluate for small bowel obstruction.  EXAM: ABDOMEN - 2 VIEW  COMPARISON:   08/04/2013 and 08/03/2013  FINDINGS: Nasogastric tube tip is near the duodenum bulb region. There is oral contrast in the left colon. Few gas-filled loops of bowel in the mid abdomen. Few air-fluid levels in the right upper abdomen. No evidence for free air.  IMPRESSION: The degree of small bowel obstruction has decreased since 08/03/2013. Oral contrast has advanced into the left colon. There continues to be a few gas-filled loops of small bowel and findings probably represent a partial small bowel obstruction.   Electronically Signed   By: Markus Daft M.D.   On: 08/06/2013 08:37   Dg Abd Acute W/chest  08/04/2013   CLINICAL DATA:  Abdominal pain and vomiting, history of small bowel obstruction.  EXAM: ACUTE ABDOMEN SERIES (ABDOMEN 2 VIEW & CHEST 1 VIEW)  COMPARISON:  Abdominal radiograph June 29, 2013.  FINDINGS: Cardiomediastinal silhouette is unremarkable. Strandy densities in left lung base. No pleural effusions. No pneumothorax. Soft tissue planes and included osseous structures are nonsuspicious.  Gas distended small bowel to 4.4 cm with paucity of large bowel gas. Small bowel air-fluid levels at relatively uniform level on upright view. No intraperitoneal free air.  No intra-abdominal mass effect or pathologic calcifications. Surgical clips in the right abdomen may reflect cholecystectomy. Phleboliths in the pelvis. Soft tissue planes and included osseous structures are nonsuspicious.  IMPRESSION: No acute cardiopulmonary process.  Recurrent high-grade small bowel obstruction.   Electronically Signed   By: Elon Alas   On: 08/04/2013 00:14   US Abdomen Limited Ruq  08/13/2013   CLINICAL DATA:  Elevated transaminases.  Status postcholecystectomy.  EXAM: US ABDOMEN LIMITED - RIGHT UPPER QUADRANT  COMPARISON:  None.  FINDINGS: Gallbladder:  Absent consistent with given history.  Common bile duct:  Diameter: 7 mm in caliber.  Liver:  No focal lesion identified. Within normal limits in parenchymal  echogenicity.  IMPRESSION: No acute abdominal pathology.   Electronically Signed   By: Maryclare Bean M.D.   On: 08/13/2013 18:03         Subjective: Patient is still not passing any flatus nor has he had bowel movements. Patient denies fevers, chills, headache, chest pain, dyspnea, nausea, vomiting, diarrhea, abdominal pain, dysuria, hematuria   Objective: Filed Vitals:   08/14/13 1529 08/14/13 2214 08/15/13 0528 08/15/13 1438  BP: 122/80 98/63 135/94 110/71  Pulse: 88 99 85 98  Temp: 97.8 F (36.6 C) 98.1 F (36.7 C) 99.2 F (37.3 C) 98.4 F (36.9 C)  TempSrc: Oral Oral Oral Oral  Resp: 18 18 18 16   Height:      Weight:      SpO2: 97% 95% 100% 98%    Intake/Output Summary (Last 24 hours) at 08/15/13 1502 Last data filed at 08/15/13 1146  Gross per 24 hour  Intake   1660 ml  Output   2625 ml  Net   -965 ml   Weight change:  Exam:   General:  Pt is alert, follows commands appropriately, not in acute distress  HEENT: No icterus, No thrush,  [place]/AT  Cardiovascular: RRR, S1/S2, no rubs, no gallops  Respiratory: CTA bilaterally, no wheezing, no crackles, no rhonchi  Abdomen: Soft/+BS, non tender, non distended, no guarding  Extremities: 1+ edema, No lymphangitis, No petechiae, No rashes, no synovitis  Data Reviewed: Basic Metabolic Panel:  Recent Labs Lab 08/10/13 0407 08/11/13 0450 08/13/13 0616 08/14/13 0500 08/15/13 0529  NA 137 135* 138 137 135*  K 4.2 4.3 4.6 4.6 4.3  CL 100 98 100 100 97  CO2 26 23 28 27 28   GLUCOSE 216* 166* 176* 195* 158*  BUN 24* 23 24* 27* 31*  CREATININE 1.08 1.04 1.00 1.00 1.04  CALCIUM 8.6 8.4 8.5 8.5 8.5  MG 2.5  --  2.4  --   --   PHOS 3.4  --  2.8  --   --    Liver Function Tests:  Recent Labs Lab 08/10/13 0407 08/13/13 0616 08/14/13 0500  AST 27 147* 59*  ALT 28 166* 118*  ALKPHOS 84 71 65  BILITOT 0.2* 0.4 0.3  PROT 6.3 5.9* 5.7*  ALBUMIN 2.9* 2.6* 2.4*   No results found for this basename: LIPASE,  AMYLASE,  in the last 168 hours No results found for this basename: AMMONIA,  in the last 168 hours CBC:  Recent Labs Lab 08/10/13 0407 08/11/13 0630  WBC 18.4* 22.6*  NEUTROABS 14.3*  --   HGB 11.2* 10.8*  HCT 36.6* 35.2*  MCV 80.1 79.1  PLT 426* 387   Cardiac Enzymes: No results found for this basename: CKTOTAL, CKMB, CKMBINDEX, TROPONINI,  in the last 168 hours BNP: No components found with this basename: POCBNP,  CBG:  Recent Labs Lab 08/14/13 1707 08/14/13 1750 08/15/13 0019 08/15/13 0617 08/15/13 1144  GLUCAP 196* 231* 152* 148* 117*    Recent Results (from the past 240 hour(s))  SURGICAL PCR SCREEN     Status: None   Collection Time    08/10/13 12:29 AM      Result Value Ref Range Status   MRSA, PCR NEGATIVE  NEGATIVE Final   Staphylococcus aureus NEGATIVE  NEGATIVE Final   Comment:            The Xpert SA Assay (FDA     approved for NASAL specimens     in patients over 25 years of age),     is one component of     a comprehensive surveillance     program.  Test performance has     been validated by [employer] for patients greater     than or equal to 46 year old.     It is not intended     to diagnose infection nor to     guide or monitor treatment.     Scheduled Meds: . chlorhexidine  15 mL Mouth Rinse BID  . dutasteride  0.5 mg Oral Daily  . heparin  5,000 Units Subcutaneous 3 times per day  . insulin aspart  0-15 Units Subcutaneous 4 times per day  . levothyroxine  87.5 mcg Intravenous Daily  . lisinopril  10 mg Oral Daily  . methylPREDNISolone (SOLU-MEDROL) injection  40 mg Intravenous Daily  . sodium chloride  3 mL Intravenous Q12H   Continuous Infusions: . sodium chloride 10 mL/hr at 08/13/13 1841  . Marland KitchenTPN (CLINIMIX-E) Adult 100 mL/hr at 08/14/13 1728   And  . fat emulsion 240 mL (08/14/13 1728)  . Marland KitchenTPN (CLINIMIX-E) Adult     And  . fat emulsion       Bruce [name], Aurelio, DO  [facility] Pager [phone]  If 7PM-7AM, please  contact night-coverage [email or web address] Password [email or web address] 08/15/2013, 3:02 PM  LOS: 12 days

## 2013-08-15 NOTE — Progress Notes (Signed)
PARENTERAL NUTRITION CONSULT NOTE - FOLLOW UP  Pharmacy Consult:  TPN Indication: Crohn's ileitis / SBO  Allergies  Allergen Reactions  . Coconut Flavor Other (See Comments)    Does not eat because of crohn's     Patient Measurements: Height: 6' (182.9 cm) Weight: 246 lb 14.6 oz (112 kg) IBW/kg (Calculated) : 77.6  Vital Signs: Temp: 99.2 F (37.3 C) (06/13 0528) Temp src: Oral (06/13 0528) BP: 135/94 mmHg (06/13 0528) Pulse Rate: 85 (06/13 0528) Intake/Output from previous day: 06/12 0701 - 06/13 0700 In: 1495 [P.O.:175; TPN:1320] Out: 2375 [Urine:2375]  Labs: No results found for this basename: WBC, HGB, HCT, PLT, APTT, INR,  in the last 72 hours   Recent Labs  08/13/13 0616 08/14/13 0500 08/15/13 0529  NA 138 137 135*  K 4.6 4.6 4.3  CL 100 100 97  CO2 28 27 28   GLUCOSE 176* 195* 158*  BUN 24* 27* 31*  CREATININE 1.00 1.00 1.04  CALCIUM 8.5 8.5 8.5  MG 2.4  --   --   PHOS 2.8  --   --   PROT 5.9* 5.7*  --   ALBUMIN 2.6* 2.4*  --   AST 147* 59*  --   ALT 166* 118*  --   ALKPHOS 71 65  --   BILITOT 0.4 0.3  --   BILIDIR  --  <0.2  --   IBILI  --  NOT CALCULATED  --    Estimated Creatinine Clearance: 92.8 ml/min (by C-G formula based on Cr of 1.04).    Recent Labs  08/14/13 1750 08/15/13 0019 08/15/13 0617  GLUCAP 231* 152* 148*   Insulin Requirements in the past 12 hours:  10 units Novolog SSI and 80 units regular insulin in TPN  Current Nutrition:  Clinimix E 5/15 100 ml/hr and 20% IVFE at 10 ml/hr.  This provides 2184 kCal and 120 gm of protein daily  Nutritional Goals:  2100-2300 kCal,105-120 grams of protein per day  Assessment:  27 YOM with history of bowel obstruction a few weeks ago presented on 08/03/13 with complaints of severe abdominal cramping, abdominal distention, and vomiting that started several hours prior to arrival. He has a history of Crohn's disease, obesity, and multiple abdominal surgeries with adhesions and recurrent  small bowel obstructions. Last admission was in April of 2015 for pSBO.  Pharmacy managing TPN for nutritional support.  GI: Crohn's ileitis / SBO / unspecified constipation / obesity.  GI started steroids for significant underlying fibrosis.  6/2 CT: consistent with Crohn's flare, likely has a chronic underlying stricture. S/p exp lap with ileocecectomy 6/9. Pt tolerated NG tube clamped  Pathology report does not show Chron's   IV pepcid  TG 64. Prealbumin WNL at 30.5.  Plan to start clears even though he has not had flatus or BM yet  Endo: hypothyroid on IV Synthroid, TSH WNL.  No hx DM - CBGs 148-231 on SSI and 80 units regular insulin in the TPN.  Lytes: Na 135, K 4.3  Renal: CKD3 / BPH - SCr 1.04- UOP 2.3 ml/kg/hr. MIVF NS at [place]: OSA - RA  Cards: HTN - BP nl, HR 92. PRN hydralazine lisinopril  Hepatobil: AST/ALT elevated  ID: Cipro/Flagyl  (6/2 >>6/12 ) for GI coverage.  Afebrile, WBC elevated and trending up (pt also on steroids).  UCx negative.  Best Practices: SQ heparin  TPN Access: PICC placed 6/3  TPN day#: 10 (6/3 >> )  Plan:  - Continue Clinimix  E 5/15 at goal rate of 131m/hr and continue 20% IVFE at 10 ml/hr.  This meets 100% of estimated kcal and protein needs. - Daily multivitamin and trace elements - Cont pepcid in TPN - Continue SSI moderate Q6H.  - F/u diet advancement  LExcell Seltzer PharmD Clinical pharmacist, pager [phone] 08/15/2013, 7:55 AM

## 2013-08-16 DIAGNOSIS — E669 Obesity, unspecified: Secondary | ICD-10-CM

## 2013-08-16 LAB — GLUCOSE, CAPILLARY
Glucose-Capillary: 134 mg/dL — ABNORMAL HIGH (ref 70–99)
Glucose-Capillary: 137 mg/dL — ABNORMAL HIGH (ref 70–99)
Glucose-Capillary: 140 mg/dL — ABNORMAL HIGH (ref 70–99)
Glucose-Capillary: 153 mg/dL — ABNORMAL HIGH (ref 70–99)

## 2013-08-16 LAB — COMPREHENSIVE METABOLIC PANEL
ALT: 87 U/L — ABNORMAL HIGH (ref 0–53)
AST: 50 U/L — ABNORMAL HIGH (ref 0–37)
Albumin: 2.6 g/dL — ABNORMAL LOW (ref 3.5–5.2)
Alkaline Phosphatase: 67 U/L (ref 39–117)
BUN: 31 mg/dL — ABNORMAL HIGH (ref 6–23)
CO2: 25 mEq/L (ref 19–32)
Calcium: 8.5 mg/dL (ref 8.4–10.5)
Chloride: 98 mEq/L (ref 96–112)
Creatinine, Ser: 1.14 mg/dL (ref 0.50–1.35)
GFR calc Af Amer: 77 mL/min — ABNORMAL LOW (ref >90)
GFR calc non Af Amer: 66 mL/min — ABNORMAL LOW (ref >90)
Glucose, Bld: 148 mg/dL — ABNORMAL HIGH (ref 70–99)
Potassium: 4.5 mEq/L (ref 3.7–5.3)
Sodium: 136 mEq/L — ABNORMAL LOW (ref 137–147)
Total Bilirubin: 0.4 mg/dL (ref 0.3–1.2)
Total Protein: 6 g/dL (ref 6.0–8.3)

## 2013-08-16 MED ORDER — LEVOTHYROXINE SODIUM 175 MCG PO TABS
175.0000 ug | ORAL_TABLET | Freq: Every day | ORAL | Status: DC
  Administered 2013-08-17 – 2013-08-18 (×2): 175 ug via ORAL
  Filled 2013-08-16 (×3): qty 1

## 2013-08-16 MED ORDER — TRACE MINERALS CR-CU-F-FE-I-MN-MO-SE-ZN IV SOLN
INTRAVENOUS | Status: AC
  Administered 2013-08-16: 19:00:00 via INTRAVENOUS
  Filled 2013-08-16: qty 3000

## 2013-08-16 MED ORDER — BISACODYL 10 MG RE SUPP
10.0000 mg | Freq: Once | RECTAL | Status: AC
  Administered 2013-08-16: 10 mg via RECTAL
  Filled 2013-08-16: qty 1

## 2013-08-16 MED ORDER — FAT EMULSION 20 % IV EMUL
240.0000 mL | INTRAVENOUS | Status: AC
  Administered 2013-08-16: 240 mL via INTRAVENOUS
  Filled 2013-08-16 (×4): qty 250

## 2013-08-16 MED ORDER — PREDNISONE 10 MG PO TABS
10.0000 mg | ORAL_TABLET | Freq: Every day | ORAL | Status: DC
  Administered 2013-08-17: 10 mg via ORAL
  Filled 2013-08-16 (×2): qty 1

## 2013-08-16 NOTE — Progress Notes (Addendum)
5 Days Post-Op  Subjective: Still no flatus he is aware of.  He is walking a good deal has hiccups all last PM.  No issue with clears, but he hasn't taken much.  Objective: Vital signs in last 24 hours: Temp:  [98 F (36.7 C)-98.4 F (36.9 C)] 98 F (36.7 C) (06/14 0647) Pulse Rate:  [97-104] 97 (06/14 0647) Resp:  [16-19] 19 (06/14 0647) BP: (109-125)/(71-82) 125/77 mmHg (06/14 0647) SpO2:  [93 %-98 %] 97 % (06/14 0647) Weight:  [110.7 kg (244 lb 0.8 oz)] 110.7 kg (244 lb 0.8 oz) (06/14 0647) Last BM Date: 08/10/13 240 PO recorded, Clear diet Afebrile, VSS Labs OK Intake/Output from previous day: 06/13 0701 - 06/14 0700 In: 1560 [P.O.:240; TPN:1320] Out: 2750 [Urine:2750] Intake/Output this shift:    General appearance: alert, cooperative and no distress Resp: clear to auscultation bilaterally GI: he is distended, but has some BS, incision looks fine.  No flatus or BM.  Lab Results:  No results found for this basename: WBC, HGB, HCT, PLT,  in the last 72 hours  BMET  Recent Labs  08/15/13 0529 08/16/13 0445  NA 135* 136*  K 4.3 4.5  CL 97 98  CO2 28 25  GLUCOSE 158* 148*  BUN 31* 31*  CREATININE 1.04 1.14  CALCIUM 8.5 8.5   PT/INR No results found for this basename: LABPROT, INR,  in the last 72 hours   Recent Labs Lab 08/10/13 0407 08/13/13 0616 08/14/13 0500 08/16/13 0445  AST 27 147* 59* 50*  ALT 28 166* 118* 87*  ALKPHOS 84 71 65 67  BILITOT 0.2* 0.4 0.3 0.4  PROT 6.3 5.9* 5.7* 6.0  ALBUMIN 2.9* 2.6* 2.4* 2.6*     Lipase     Component Value Date/Time   LIPASE 47 08/03/2013 2258     Studies/Results: No results found.  Medications: . chlorhexidine  15 mL Mouth Rinse BID  . dutasteride  0.5 mg Oral Daily  . heparin  5,000 Units Subcutaneous 3 times per day  . insulin aspart  0-15 Units Subcutaneous 4 times per day  . levothyroxine  87.5 mcg Intravenous Daily  . lisinopril  10 mg Oral Daily  . predniSONE  20 mg Oral Q breakfast  .  sodium chloride  3 mL Intravenous Q12H     Small intestine, resection, terminal ileum and cecum  - BENIGN SMALL BOWEL AND COLONIC MUCOSA WITH SUBMUCOSAL SMOOTH MUSCLE  HYPERPLASIA.  - FIVE BENIGN LYMPH NODES (0/5).  - THE SURGICAL RESECTION MARGINS ARE HISTOLOGICALLY VIABLE.  Assessment/Plan  1. Small bowel obstruction secondary to stricture from Crohn disease involving the terminal ileum. Exploratory laparotomy with ileocecectomy. Thomas A. Cornett, M.D. 08/11/2013.  As noted above, path does not show Crohn's.  2. S/p cholecystectomy, splenectomy, Prior intestinal perforation with diverticulitis and SBO 4/26-28/15, 1 episode 2010. No prior colon or small bowel resection.  3. Hx of acute respiratory failure with hypoxia/Sleep apnea  4. Hypertension  5. Hypothyroid  6. Body mass index is 33.8  7. Hx of BPH    Plan:  I will try a suppository.  Continue TNA till he starts having flatus.  He reports 2 BM since i first saw him.  So I will up him to full liquids.  He is still walking the halls.  LOS: 13 days    Elmar Antigua 08/16/2013

## 2013-08-16 NOTE — Progress Notes (Signed)
PROGRESS NOTE  Bruce [name] GUR:[id number] DOB: Dec 14, 1950 DOA: 08/03/2013 PCP: [name], MD  Assessment/Plan:  Crohn's ileitis /Small bowel obstruction  -d/c cipro and flagyl (had 10 days)  -continue IV solumedrol-->discussed with Dr. Liborio Nixon decreasing solumedrol  - he has a combination of acute Crohn's exacerbation in setting of fibrostenosing disease that likely will not respond to medication. GI recommended surgical resection.  -PICC placed and beganTNA . SBO improved on x ray.  -patient taken to OR 08/11/13 with laparoscopic small bowel resection.  -d/c PCA as pt is getting <42m Dilaudid over 24 hr period--discussed with surgery  -[place] suggests post op pt will require anti-TNF or immunomodulator therapy to reduce the chance of recurrence at the anastamosis.  -08/13/13 NG clamped--appreciate surgery followup  -08/14/13 NG removed, still no BM or flatus  -08/16/13--Pt had 4 BMs -wean solumedrol  Transaminasemia  -Likely due to TPN  -Right upper quadrant ultrasound--neg  -Continue to trend-->trending down  Metabolic acidosis  -resolved  Acute respiratory failure with hypoxia/Sleep apnea  Resolved.  -likely from mechanical issues from recent distended abdomen and SBO  -cont O2  -CPAP per respiratory. pt refused saying does not use at home and actually gave away his machine.  Dehydration/tachycardia  -improved  Hypertension  -BP stable  -prn Hydralazine  -Continue lisinopril  Hypothyroidism  -continue IV Synthroid-->change to po Leukocytosis  -Secondary to the Solu-Medrol.  -continue to wean steroids to prednisone  -d/c antibiotics  -no fevers, hemodynamically stable  Hyperglycemia with hx of impaired glucose intolerance  -HbA1C  -Continue NovoLog sliding scale  Family Communication: no family Disposition Plan: Home when medically stable       Procedures/Studies: X-ray Chest Pa And Lateral   08/05/2013    CLINICAL DATA:  diaphoresis  EXAM: CHEST  2 VIEW  COMPARISON:  Abdominal series dated 08/03/2013  FINDINGS: The heart size and mediastinal contours are within normal limits. Stable mild discoid atelectasis versus scarring left lung base. Lungs otherwise clear. NG tube has been inserted tip not view of this study. No acute osseous abnormalities.  IMPRESSION: No active cardiopulmonary disease.   Electronically Signed   By: HMargaree MackintoshM.D.   On: 08/05/2013 09:32   Ct Abdomen Pelvis W Contrast  08/04/2013   CLINICAL DATA:  Abdominal pain. History of small bowel obstruction. History of Crohn's disease.  EXAM: CT ABDOMEN AND PELVIS WITH CONTRAST  TECHNIQUE: Multidetector CT imaging of the abdomen and pelvis was performed using the standard protocol following bolus administration of intravenous contrast.  CONTRAST:  1067mOMNIPAQUE IOHEXOL 300 MG/ML  SOLN  COMPARISON:  04/16/2013  FINDINGS: BODY WALL: Fatty umbilical and periumbilical hernias. Fatty enlargement of the bilateral inguinal canals.  LOWER CHEST: Mild atelectasis at the left base.  ABDOMEN/PELVIS:  Liver: No focal abnormality.  Biliary: Cholecystectomy.  Pancreas: Unremarkable.  Spleen: In place of the spleen, there are multiple homogeneously enhancing nodules throughout the peritoneal space, consistent with splenosis.  Adrenals: Unremarkable.  Kidneys and ureters: There are bilateral indeterminate renal cortical lesions. On the right, there is a 2 cm lesion in the posterior interpolar kidney that measures greater than water density at 50 Hounsfield units There is no definitive deenhancement. In the left kidney there are two somewhat ill-defined and mildly hyperdense lesions both measuring approximately 12 mm in the interpolar region.  Bladder: Unremarkable.  Reproductive: Mild prostate enlargement.  Bowel: Multiple segments of dilated and fluid-filled small bowel  leading up to the terminal ileum where there is abrupt transition point in a region of wall  thickening. The mucosa in this region is avidly enhancing. There is alternating narrow and dilated segments of the terminal ileum. Small bowel obstruction causes reactive edema in the associated mesenteries and in the neighboring peritoneal compartment. No evidence of bowel perforation or devascularization. Nasogastric tube tip in the distal ligament. No pericecal inflammation.  Retroperitoneum: No mass or adenopathy.  Peritoneum: Splenosis as above.  Vascular: No acute abnormality.  OSSEOUS: No acute abnormalities.  IMPRESSION: 1. Small bowel obstruction at the level of the terminal ileum where there is active Crohn's enteritis. There could be a superimposed stenosis at this level. 2. Small bilateral indeterminate renal lesions, complex cyst versus solid mass. Due to patient size, ultrasound would be challenging; recommend followup outpatient MRI (when the patient is able to cooperate with breathing instructions). 3. Splenosis.   Electronically Signed   By: Jorje Guild M.D.   On: 08/04/2013 05:26   Dg Abd 2 Views  08/09/2013   CLINICAL DATA:  Followup evaluation of small bowel obstruction.  EXAM: ABDOMEN - 2 VIEW  COMPARISON:  08/07/2013.  FINDINGS: Previously noted nasogastric tube has been removed. Surgical clips project over the right upper quadrant of the abdomen, compatible with prior cholecystectomy. Gas is noted throughout the colon. No distal rectal gas is identified at this time. No pathologic distention of small bowel. No pneumoperitoneum.  IMPRESSION: 1. Nonspecific, nonobstructive bowel gas pattern, as above. 2. No pneumoperitoneum.   Electronically Signed   By: Vinnie Langton M.D.   On: 08/09/2013 12:13   Dg Abd 2 Views  08/07/2013   CLINICAL DATA:  Follow up partial small bowel obstruction.  EXAM: ABDOMEN - 2 VIEW  COMPARISON:  08/06/2013  FINDINGS: Enteric tube remains in place with tip in the expected region of the distal stomach or duodenal bulb. Right upper quadrant surgical clips are  again seen. Residual oral contrast material in the left colon does not appear significantly changed. A few small air-fluid levels are again seen in the right mid to upper abdomen, slightly less prominent than on the prior study. There is otherwise a relative paucity of small bowel gas. No intraperitoneal free air is identified. Mild lumbar dextroscoliosis is again seen. Mild elevation the right hemidiaphragm does not appear significantly changed.  IMPRESSION: A few small air-fluid levels in the right abdomen, slightly less prominent than on the prior study and suggestive of improving partial small bowel obstruction.   Electronically Signed   By: Logan Bores   On: 08/07/2013 08:34   Dg Abd 2 Views  08/06/2013   CLINICAL DATA:  Evaluate for small bowel obstruction.  EXAM: ABDOMEN - 2 VIEW  COMPARISON:  08/04/2013 and 08/03/2013  FINDINGS: Nasogastric tube tip is near the duodenum bulb region. There is oral contrast in the left colon. Few gas-filled loops of bowel in the mid abdomen. Few air-fluid levels in the right upper abdomen. No evidence for free air.  IMPRESSION: The degree of small bowel obstruction has decreased since 08/03/2013. Oral contrast has advanced into the left colon. There continues to be a few gas-filled loops of small bowel and findings probably represent a partial small bowel obstruction.   Electronically Signed   By: Markus Daft M.D.   On: 08/06/2013 08:37   Dg Abd Acute W/chest  08/04/2013   CLINICAL DATA:  Abdominal pain and vomiting, history of small bowel obstruction.  EXAM: ACUTE ABDOMEN SERIES (ABDOMEN 2  VIEW & CHEST 1 VIEW)  COMPARISON:  Abdominal radiograph June 29, 2013.  FINDINGS: Cardiomediastinal silhouette is unremarkable. Strandy densities in left lung base. No pleural effusions. No pneumothorax. Soft tissue planes and included osseous structures are nonsuspicious.  Gas distended small bowel to 4.4 cm with paucity of large bowel gas. Small bowel air-fluid levels at relatively  uniform level on upright view. No intraperitoneal free air.  No intra-abdominal mass effect or pathologic calcifications. Surgical clips in the right abdomen may reflect cholecystectomy. Phleboliths in the pelvis. Soft tissue planes and included osseous structures are nonsuspicious.  IMPRESSION: No acute cardiopulmonary process.  Recurrent high-grade small bowel obstruction.   Electronically Signed   By: Elon Alas   On: 08/04/2013 00:14   US Abdomen Limited Ruq  08/13/2013   CLINICAL DATA:  Elevated transaminases.  Status postcholecystectomy.  EXAM: US ABDOMEN LIMITED - RIGHT UPPER QUADRANT  COMPARISON:  None.  FINDINGS: Gallbladder:  Absent consistent with given history.  Common bile duct:  Diameter: 7 mm in caliber.  Liver:  No focal lesion identified. Within normal limits in parenchymal echogenicity.  IMPRESSION: No acute abdominal pathology.   Electronically Signed   By: Maryclare Bean M.D.   On: 08/13/2013 18:03         Subjective:   Objective: Filed Vitals:   08/15/13 1438 08/15/13 2239 08/16/13 0647 08/16/13 1124  BP: 110/71 109/82 125/77 116/77  Pulse: 98 104 97 100  Temp: 98.4 F (36.9 C) 98 F (36.7 C) 98 F (36.7 C) 98 F (36.7 C)  TempSrc: Oral Oral Oral Oral  Resp: 16 17 19 16   Height:      Weight:   110.7 kg (244 lb 0.8 oz)   SpO2: 98% 93% 97% 97%    Intake/Output Summary (Last 24 hours) at 08/16/13 1823 Last data filed at 08/16/13 1556  Gross per 24 hour  Intake   1560 ml  Output   3500 ml  Net  -1940 ml   Weight change:  Exam:   General:  Pt is alert, follows commands appropriately, not in acute distress  HEENT: No icterus, No thrush, No neck mass, [place]/AT  Cardiovascular: RRR, S1/S2, no rubs, no gallops  Respiratory: CTA bilaterally, no wheezing, no crackles, no rhonchi  Abdomen: Soft/+BS, non tender, non distended, no guarding  Extremities: No edema, No lymphangitis, No petechiae, No rashes, no synovitis  Data Reviewed: Basic Metabolic  Panel:  Recent Labs Lab 08/10/13 0407 08/11/13 0450 08/13/13 0616 08/14/13 0500 08/15/13 0529 08/16/13 0445  NA 137 135* 138 137 135* 136*  K 4.2 4.3 4.6 4.6 4.3 4.5  CL 100 98 100 100 97 98  CO2 26 23 28 27 28 25   GLUCOSE 216* 166* 176* 195* 158* 148*  BUN 24* 23 24* 27* 31* 31*  CREATININE 1.08 1.04 1.00 1.00 1.04 1.14  CALCIUM 8.6 8.4 8.5 8.5 8.5 8.5  MG 2.5  --  2.4  --   --   --   PHOS 3.4  --  2.8  --   --   --    Liver Function Tests:  Recent Labs Lab 08/10/13 0407 08/13/13 0616 08/14/13 0500 08/16/13 0445  AST 27 147* 59* 50*  ALT 28 166* 118* 87*  ALKPHOS 84 71 65 67  BILITOT 0.2* 0.4 0.3 0.4  PROT 6.3 5.9* 5.7* 6.0  ALBUMIN 2.9* 2.6* 2.4* 2.6*   No results found for this basename: LIPASE, AMYLASE,  in the last 168 hours No results found for  this basename: AMMONIA,  in the last 168 hours CBC:  Recent Labs Lab 08/10/13 0407 08/11/13 0630  WBC 18.4* 22.6*  NEUTROABS 14.3*  --   HGB 11.2* 10.8*  HCT 36.6* 35.2*  MCV 80.1 79.1  PLT 426* 387   Cardiac Enzymes: No results found for this basename: CKTOTAL, CKMB, CKMBINDEX, TROPONINI,  in the last 168 hours BNP: No components found with this basename: POCBNP,  CBG:  Recent Labs Lab 08/15/13 1708 08/15/13 2358 08/16/13 0650 08/16/13 1115 08/16/13 1704  GLUCAP 201* 138* 137* 134* 140*    Recent Results (from the past 240 hour(s))  SURGICAL PCR SCREEN     Status: None   Collection Time    08/10/13 12:29 AM      Result Value Ref Range Status   MRSA, PCR NEGATIVE  NEGATIVE Final   Staphylococcus aureus NEGATIVE  NEGATIVE Final   Comment:            The Xpert SA Assay (FDA     approved for NASAL specimens     in patients over 31 years of age),     is one component of     a comprehensive surveillance     program.  Test performance has     been validated by [employer] for patients greater     than or equal to 16 year old.     It is not intended     to diagnose infection nor to      guide or monitor treatment.     Scheduled Meds: . chlorhexidine  15 mL Mouth Rinse BID  . dutasteride  0.5 mg Oral Daily  . heparin  5,000 Units Subcutaneous 3 times per day  . insulin aspart  0-15 Units Subcutaneous 4 times per day  . levothyroxine  87.5 mcg Intravenous Daily  . lisinopril  10 mg Oral Daily  . [START ON 08/17/2013] predniSONE  10 mg Oral Q breakfast  . sodium chloride  3 mL Intravenous Q12H   Continuous Infusions: . sodium chloride 10 mL/hr at 08/13/13 1841  . Marland KitchenTPN (CLINIMIX-E) Adult     And  . fat emulsion       Huma Imhoff, Mary, DO  [facility] Pager [phone]  If 7PM-7AM, please contact night-coverage [email or web address] Password [facility] 08/16/2013, 6:23 PM   LOS: 13 days

## 2013-08-16 NOTE — Progress Notes (Signed)
I have seen and examined the pt and agree with PA-Jenning's progress note. Supp-await bowel function Walking well

## 2013-08-16 NOTE — Progress Notes (Signed)
PARENTERAL NUTRITION CONSULT NOTE - FOLLOW UP  Pharmacy Consult:  TPN Indication: Crohn's ileitis / SBO  Allergies  Allergen Reactions  . Coconut Flavor Other (See Comments)    Does not eat because of crohn's     Patient Measurements: Height: 6' (182.9 cm) Weight: 244 lb 0.8 oz (110.7 kg) IBW/kg (Calculated) : 77.6  Vital Signs: Temp: 98 F (36.7 C) (06/14 0647) Temp src: Oral (06/14 0647) BP: 125/77 mmHg (06/14 0647) Pulse Rate: 97 (06/14 0647) Intake/Output from previous day: 06/13 0701 - 06/14 0700 In: 1560 [P.O.:240; TPN:1320] Out: 2750 [Urine:2750]  Labs: No results found for this basename: WBC, HGB, HCT, PLT, APTT, INR,  in the last 72 hours   Recent Labs  08/14/13 0500 08/15/13 0529 08/16/13 0445  NA 137 135* 136*  K 4.6 4.3 4.5  CL 100 97 98  CO2 27 28 25   GLUCOSE 195* 158* 148*  BUN 27* 31* 31*  CREATININE 1.00 1.04 1.14  CALCIUM 8.5 8.5 8.5  PROT 5.7*  --  6.0  ALBUMIN 2.4*  --  2.6*  AST 59*  --  50*  ALT 118*  --  87*  ALKPHOS 65  --  67  BILITOT 0.3  --  0.4  BILIDIR <0.2  --   --   IBILI NOT CALCULATED  --   --    Estimated Creatinine Clearance: 84.1 ml/min (by C-G formula based on Cr of 1.14).    Recent Labs  08/15/13 1708 08/15/13 2358 08/16/13 0650  GLUCAP 201* 138* 137*   Insulin Requirements in the past 12 hours:  9 units Novolog SSI and 80 units regular insulin in TPN  Current Nutrition:  Clinimix E 5/15 100 ml/hr and 20% IVFE at 100 ml/hr.  This provides 2184 kCal and 120 gm of protein daily  Nutritional Goals:  2100-2300 kCal,105-120 grams of protein per day  Assessment:  89 YOM with history of bowel obstruction a few weeks ago presented on 08/03/13 with complaints of severe abdominal cramping, abdominal distention, and vomiting that started several hours prior to arrival. He has a history of Crohn's disease, obesity, and multiple abdominal surgeries with adhesions and recurrent small bowel obstructions. Last admission was  in April of 2015 for pSBO.  Pharmacy managing TPN for nutritional support.  GI: Crohn's ileitis / SBO / unspecified constipation / obesity.  GI started steroids for significant underlying fibrosis.  6/2 CT: consistent with Crohn's flare, likely has a chronic underlying stricture. S/p exp lap with ileocecectomy 6/9. Pt tolerated NG tube clamped  Pathology report does not show Chron's   IV pepcid  TG 64. Prealbumin WNL at 30.5.  Started clears, no flatus or BM yet  Endo: hypothyroid on IV Synthroid, TSH WNL.  No hx DM - CBGs 117-138 on SSI and 80 units regular insulin in the TPN. Weaning steroids  Lytes: Na 136, K 4.5  Renal: CKD3 / BPH - SCr 1.14- UOP 1 ml/kg/hr. MIVF NS at [place]: OSA - RA  Cards: HTN - VSS. PRN hydralazine lisinopril  Hepatobil: AST/ALT elevated  ID: Cipro/Flagyl  (6/2 >>6/12 ) completed for GI coverage.  Afebrile, WBC elevated (pt also on steroids).  UCx negative.  Best Practices: SQ heparin  TPN Access: PICC placed 6/3  TPN day#: 11 (6/3 >> )  Plan:  - Continue Clinimix E 5/15 at goal rate of 173m/hr and continue 20% IVFE at 10 ml/hr.  This meets 100% of estimated kcal and protein needs. - Daily multivitamin and  trace elements - Cont pepcid in TPN - Continue SSI moderate Q6H. May need to back off insulin once steroids dced. - F/u diet advancement  Excell Seltzer, PharmD Clinical pharmacist, pager [phone]  08/16/2013, 7:57 AM

## 2013-08-17 DIAGNOSIS — E119 Type 2 diabetes mellitus without complications: Secondary | ICD-10-CM

## 2013-08-17 DIAGNOSIS — E118 Type 2 diabetes mellitus with unspecified complications: Secondary | ICD-10-CM

## 2013-08-17 HISTORY — DX: Type 2 diabetes mellitus with unspecified complications: E11.8

## 2013-08-17 LAB — MAGNESIUM: Magnesium: 2.2 mg/dL (ref 1.5–2.5)

## 2013-08-17 LAB — COMPREHENSIVE METABOLIC PANEL
ALT: 74 U/L — ABNORMAL HIGH (ref 0–53)
AST: 35 U/L (ref 0–37)
Albumin: 2.5 g/dL — ABNORMAL LOW (ref 3.5–5.2)
Alkaline Phosphatase: 64 U/L (ref 39–117)
BUN: 30 mg/dL — ABNORMAL HIGH (ref 6–23)
CO2: 27 mEq/L (ref 19–32)
Calcium: 8.4 mg/dL (ref 8.4–10.5)
Chloride: 100 mEq/L (ref 96–112)
Creatinine, Ser: 1.15 mg/dL (ref 0.50–1.35)
GFR calc Af Amer: 76 mL/min — ABNORMAL LOW (ref >90)
GFR calc non Af Amer: 65 mL/min — ABNORMAL LOW (ref >90)
Glucose, Bld: 94 mg/dL (ref 70–99)
Potassium: 4.4 mEq/L (ref 3.7–5.3)
Sodium: 137 mEq/L (ref 137–147)
Total Bilirubin: 0.4 mg/dL (ref 0.3–1.2)
Total Protein: 5.8 g/dL — ABNORMAL LOW (ref 6.0–8.3)

## 2013-08-17 LAB — CBC WITH DIFFERENTIAL/PLATELET
Basophils Absolute: 0 10*3/uL (ref 0.0–0.1)
Basophils Relative: 0 % (ref 0–1)
Eosinophils Absolute: 0.2 10*3/uL (ref 0.0–0.7)
Eosinophils Relative: 1 % (ref 0–5)
HCT: 31.4 % — ABNORMAL LOW (ref 39.0–52.0)
Hemoglobin: 9.9 g/dL — ABNORMAL LOW (ref 13.0–17.0)
Lymphocytes Relative: 22 % (ref 12–46)
Lymphs Abs: 3.5 10*3/uL (ref 0.7–4.0)
MCH: 24.9 pg — ABNORMAL LOW (ref 26.0–34.0)
MCHC: 31.5 g/dL (ref 30.0–36.0)
MCV: 78.9 fL (ref 78.0–100.0)
Monocytes Absolute: 2.3 10*3/uL — ABNORMAL HIGH (ref 0.1–1.0)
Monocytes Relative: 14 % — ABNORMAL HIGH (ref 3–12)
Neutro Abs: 9.9 10*3/uL — ABNORMAL HIGH (ref 1.7–7.7)
Neutrophils Relative %: 63 % (ref 43–77)
Platelets: 294 10*3/uL (ref 150–400)
RBC: 3.98 MIL/uL — ABNORMAL LOW (ref 4.22–5.81)
RDW: 17.3 % — ABNORMAL HIGH (ref 11.5–15.5)
WBC: 15.9 10*3/uL — ABNORMAL HIGH (ref 4.0–10.5)

## 2013-08-17 LAB — HEMOGLOBIN A1C
Hgb A1c MFr Bld: 7.3 % — ABNORMAL HIGH (ref <5.7)
Mean Plasma Glucose: 163 mg/dL — ABNORMAL HIGH (ref <117)

## 2013-08-17 LAB — GLUCOSE, CAPILLARY
Glucose-Capillary: 101 mg/dL — ABNORMAL HIGH (ref 70–99)
Glucose-Capillary: 103 mg/dL — ABNORMAL HIGH (ref 70–99)
Glucose-Capillary: 125 mg/dL — ABNORMAL HIGH (ref 70–99)
Glucose-Capillary: 143 mg/dL — ABNORMAL HIGH (ref 70–99)

## 2013-08-17 LAB — PHOSPHORUS: Phosphorus: 4.5 mg/dL (ref 2.3–4.6)

## 2013-08-17 LAB — PREALBUMIN: Prealbumin: 33.3 mg/dL (ref 17.0–34.0)

## 2013-08-17 LAB — TRIGLYCERIDES: Triglycerides: 126 mg/dL (ref <150)

## 2013-08-17 MED ORDER — INSULIN ASPART 100 UNIT/ML ~~LOC~~ SOLN: 0.0000 [IU] | Freq: Every day | SUBCUTANEOUS | Status: DC

## 2013-08-17 MED ORDER — SODIUM CHLORIDE 0.9 % IV SOLN
INTRAVENOUS | Status: DC
  Administered 2013-08-18: 01:00:00 via INTRAVENOUS

## 2013-08-17 MED ORDER — FAMOTIDINE 20 MG PO TABS
20.0000 mg | ORAL_TABLET | Freq: Two times a day (BID) | ORAL | Status: DC
  Administered 2013-08-17 – 2013-08-18 (×2): 20 mg via ORAL
  Filled 2013-08-17 (×3): qty 1

## 2013-08-17 MED ORDER — INSULIN ASPART 100 UNIT/ML ~~LOC~~ SOLN: 0.0000 [IU] | Freq: Three times a day (TID) | SUBCUTANEOUS | Status: DC

## 2013-08-17 MED ORDER — INSULIN ASPART 100 UNIT/ML ~~LOC~~ SOLN
0.0000 [IU] | Freq: Three times a day (TID) | SUBCUTANEOUS | Status: DC
  Administered 2013-08-17 – 2013-08-18 (×3): 2 [IU] via SUBCUTANEOUS

## 2013-08-17 NOTE — Discharge Instructions (Signed)
[facility]      [facility] [phone]  OPEN ABDOMINAL SURGERY: POST OP INSTRUCTIONS  Always review your discharge instruction sheet given to you by the facility where your surgery was performed.  IF YOU HAVE DISABILITY OR FAMILY LEAVE FORMS, YOU MUST BRING THEM TO THE OFFICE FOR PROCESSING.  PLEASE DO NOT GIVE THEM TO YOUR DOCTOR.  1. A prescription for pain medication may be given to you upon discharge.  Take your pain medication as prescribed, if needed.  If narcotic pain medicine is not needed, then you may take acetaminophen (Tylenol) or ibuprofen (Advil) as needed. 2. Take your usually prescribed medications unless otherwise directed. 3. If you need a refill on your pain medication, please contact your pharmacy. They will contact our office to request authorization.  Prescriptions will not be filled after 5pm or on week-ends. 4. You should follow a light diet the first few days after arrival home, such as soup and crackers, pudding, etc.unless your doctor has advised otherwise. A high-fiber, low fat diet can be resumed as tolerated.   Be sure to include lots of fluids daily. Most patients will experience some swelling and bruising on the chest and neck area.  Ice packs will help.  Swelling and bruising can take several days to resolve 5. Most patients will experience some swelling and bruising in the area of the incision. Ice pack will help. Swelling and bruising can take several days to resolve..  6. It is common to experience some constipation if taking pain medication after surgery.  Increasing fluid intake and taking a stool softener will usually help or prevent this problem from occurring.  A mild laxative (Milk of Magnesia or Miralax) should be taken according to package directions if there are no bowel movements after 48 hours. 7.  You may have steri-strips (small skin tapes) in place directly over the incision.  These strips should be left on the skin for 7-10 days.  If your  surgeon used skin glue on the incision, you may shower in 24 hours.  The glue will flake off over the next 2-3 weeks.  Any sutures or staples will be removed at the office during your follow-up visit. You may find that a light gauze bandage over your incision may keep your staples from being rubbed or pulled. You may shower and replace the bandage daily. 8. ACTIVITIES:  You may resume regular (light) daily activities beginning the next day--such as daily self-care, walking, climbing stairs--gradually increasing activities as tolerated.  You may have sexual intercourse when it is comfortable.  Refrain from any heavy lifting or straining until approved by your doctor. a. You may drive when you no longer are taking prescription pain medication, you can comfortably wear a seatbelt, and you can safely maneuver your car and apply brakes b. Return to Work: ___________________________________ 9. You should see your doctor in the office for a follow-up appointment approximately two weeks after your surgery.  Make sure that you call for this appointment within a day or two after you arrive home to insure a convenient appointment time. OTHER INSTRUCTIONS:  _____________________________________________________________ _____________________________________________________________  WHEN TO CALL YOUR DOCTOR: 1. Fever over 101.0 2. Inability to urinate 3. Nausea and/or vomiting 4. Extreme swelling or bruising 5. Continued bleeding from incision. 6. Increased pain, redness, or drainage from the incision. 7. Difficulty swallowing or breathing 8. Muscle cramping or spasms. 9. Numbness or tingling in hands or feet or around lips.  The clinic staff is available to   answer your questions during regular business hours.  Please don't hesitate to call and ask to speak to one of the nurses if you have concerns.  For further questions, please visit [email or web address]   

## 2013-08-17 NOTE — Progress Notes (Signed)
PROGRESS NOTE  Bruce [name] WKM:[id number] DOB: [birth year] DOA: 08/03/2013 PCP: [name], MD  Assessment/Plan: Crohn's ileitis /Small bowel obstruction  -d/c cipro and flagyl (had 10 days)  -continue IV solumedrol-->discussed with Dr. Liborio Nixon decreasing solumedrol  - he has a combination of acute Crohn's exacerbation in setting of fibrostenosing disease that likely will not respond to medication. GI recommended surgical resection.  -PICC placed and beganTNA . SBO improved on x ray.  -patient taken to OR 08/11/13 with laparoscopic small bowel resection.  -d/c PCA as pt is getting <48m Dilaudid over 24 hr period--discussed with surgery  -[place] suggests post op pt will require anti-TNF or immunomodulator therapy to reduce the chance of recurrence at the anastamosis-pt states he wants to f/u with Dr. HBenson Norway-08/13/13 NG clamped--appreciate surgery followup  -08/14/13 NG removed, still no BM or flatus  -08/16/13--Pt had 4 BMs  -weaned prednisone off 08/17/13 -08/17/13-->carb modified diet, d/c TPN -Stool for C.diff Transaminasemia  -Likely due to TPN  -Right upper quadrant ultrasound--neg  -Continue to trend-->trending down  Metabolic acidosis  -resolved  Acute respiratory failure with hypoxia/Sleep apnea  Resolved.  -likely from mechanical issues from recent distended abdomen and SBO  -cont O2  -CPAP per respiratory. pt refused saying does not use at home and actually gave away his machine.  Dehydration/tachycardia  -improved  Hypertension  -BP stable  -prn Hydralazine  -Continue lisinopril  Hypothyroidism  -continue IV Synthroid-->change to po  Leukocytosis  -Secondary to the Solu-Medrol.  -continue to wean steroids to prednisone  -d/c antibiotics  -no fevers, hemodynamically stable  Hyperglycemia with hx of impaired glucose intolerance  -HbA1C--7.3 -Continue NovoLog sliding scale  -Discussed with patient and wife at the  bedside--> they have decided upon last modification rather than starting meds at this time Family Communication: wife at bedside Disposition Plan: Home 08/18/13          Procedures/Studies: X-ray Chest Pa And Lateral   08/05/2013   CLINICAL DATA:  diaphoresis  EXAM: CHEST  2 VIEW  COMPARISON:  Abdominal series dated 08/03/2013  FINDINGS: The heart size and mediastinal contours are within normal limits. Stable mild discoid atelectasis versus scarring left lung base. Lungs otherwise clear. NG tube has been inserted tip not view of this study. No acute osseous abnormalities.  IMPRESSION: No active cardiopulmonary disease.   Electronically Signed   By: HMargaree MackintoshM.D.   On: 08/05/2013 09:32   Ct Abdomen Pelvis W Contrast  08/04/2013   CLINICAL DATA:  Abdominal pain. History of small bowel obstruction. History of Crohn's disease.  EXAM: CT ABDOMEN AND PELVIS WITH CONTRAST  TECHNIQUE: Multidetector CT imaging of the abdomen and pelvis was performed using the standard protocol following bolus administration of intravenous contrast.  CONTRAST:  1036mOMNIPAQUE IOHEXOL 300 MG/ML  SOLN  COMPARISON:  04/16/2013  FINDINGS: BODY WALL: Fatty umbilical and periumbilical hernias. Fatty enlargement of the bilateral inguinal canals.  LOWER CHEST: Mild atelectasis at the left base.  ABDOMEN/PELVIS:  Liver: No focal abnormality.  Biliary: Cholecystectomy.  Pancreas: Unremarkable.  Spleen: In place of the spleen, there are multiple homogeneously enhancing nodules throughout the peritoneal space, consistent with splenosis.  Adrenals: Unremarkable.  Kidneys and ureters: There are bilateral indeterminate renal cortical lesions. On the right, there is a 2 cm lesion in the posterior interpolar kidney that measures greater than water density at 50 Hounsfield units There is no definitive deenhancement. In  the left kidney there are two somewhat ill-defined and mildly hyperdense lesions both measuring approximately 12 mm in the  interpolar region.  Bladder: Unremarkable.  Reproductive: Mild prostate enlargement.  Bowel: Multiple segments of dilated and fluid-filled small bowel leading up to the terminal ileum where there is abrupt transition point in a region of wall thickening. The mucosa in this region is avidly enhancing. There is alternating narrow and dilated segments of the terminal ileum. Small bowel obstruction causes reactive edema in the associated mesenteries and in the neighboring peritoneal compartment. No evidence of bowel perforation or devascularization. Nasogastric tube tip in the distal ligament. No pericecal inflammation.  Retroperitoneum: No mass or adenopathy.  Peritoneum: Splenosis as above.  Vascular: No acute abnormality.  OSSEOUS: No acute abnormalities.  IMPRESSION: 1. Small bowel obstruction at the level of the terminal ileum where there is active Crohn's enteritis. There could be a superimposed stenosis at this level. 2. Small bilateral indeterminate renal lesions, complex cyst versus solid mass. Due to patient size, ultrasound would be challenging; recommend followup outpatient MRI (when the patient is able to cooperate with breathing instructions). 3. Splenosis.   Electronically Signed   By: Jorje Guild M.D.   On: 08/04/2013 05:26   Dg Abd 2 Views  08/09/2013   CLINICAL DATA:  Followup evaluation of small bowel obstruction.  EXAM: ABDOMEN - 2 VIEW  COMPARISON:  08/07/2013.  FINDINGS: Previously noted nasogastric tube has been removed. Surgical clips project over the right upper quadrant of the abdomen, compatible with prior cholecystectomy. Gas is noted throughout the colon. No distal rectal gas is identified at this time. No pathologic distention of small bowel. No pneumoperitoneum.  IMPRESSION: 1. Nonspecific, nonobstructive bowel gas pattern, as above. 2. No pneumoperitoneum.   Electronically Signed   By: Vinnie Langton M.D.   On: 08/09/2013 12:13   Dg Abd 2 Views  08/07/2013   CLINICAL DATA:   Follow up partial small bowel obstruction.  EXAM: ABDOMEN - 2 VIEW  COMPARISON:  08/06/2013  FINDINGS: Enteric tube remains in place with tip in the expected region of the distal stomach or duodenal bulb. Right upper quadrant surgical clips are again seen. Residual oral contrast material in the left colon does not appear significantly changed. A few small air-fluid levels are again seen in the right mid to upper abdomen, slightly less prominent than on the prior study. There is otherwise a relative paucity of small bowel gas. No intraperitoneal free air is identified. Mild lumbar dextroscoliosis is again seen. Mild elevation the right hemidiaphragm does not appear significantly changed.  IMPRESSION: A few small air-fluid levels in the right abdomen, slightly less prominent than on the prior study and suggestive of improving partial small bowel obstruction.   Electronically Signed   By: Logan Bores   On: 08/07/2013 08:34   Dg Abd 2 Views  08/06/2013   CLINICAL DATA:  Evaluate for small bowel obstruction.  EXAM: ABDOMEN - 2 VIEW  COMPARISON:  08/04/2013 and 08/03/2013  FINDINGS: Nasogastric tube tip is near the duodenum bulb region. There is oral contrast in the left colon. Few gas-filled loops of bowel in the mid abdomen. Few air-fluid levels in the right upper abdomen. No evidence for free air.  IMPRESSION: The degree of small bowel obstruction has decreased since 08/03/2013. Oral contrast has advanced into the left colon. There continues to be a few gas-filled loops of small bowel and findings probably represent a partial small bowel obstruction.   Electronically Signed  By: Markus Daft M.D.   On: 08/06/2013 08:37   Dg Abd Acute W/chest  08/04/2013   CLINICAL DATA:  Abdominal pain and vomiting, history of small bowel obstruction.  EXAM: ACUTE ABDOMEN SERIES (ABDOMEN 2 VIEW & CHEST 1 VIEW)  COMPARISON:  Abdominal radiograph June 29, 2013.  FINDINGS: Cardiomediastinal silhouette is unremarkable. Strandy  densities in left lung base. No pleural effusions. No pneumothorax. Soft tissue planes and included osseous structures are nonsuspicious.  Gas distended small bowel to 4.4 cm with paucity of large bowel gas. Small bowel air-fluid levels at relatively uniform level on upright view. No intraperitoneal free air.  No intra-abdominal mass effect or pathologic calcifications. Surgical clips in the right abdomen may reflect cholecystectomy. Phleboliths in the pelvis. Soft tissue planes and included osseous structures are nonsuspicious.  IMPRESSION: No acute cardiopulmonary process.  Recurrent high-grade small bowel obstruction.   Electronically Signed   By: Elon Alas   On: 08/04/2013 00:14   US Abdomen Limited Ruq  08/13/2013   CLINICAL DATA:  Elevated transaminases.  Status postcholecystectomy.  EXAM: US ABDOMEN LIMITED - RIGHT UPPER QUADRANT  COMPARISON:  None.  FINDINGS: Gallbladder:  Absent consistent with given history.  Common bile duct:  Diameter: 7 mm in caliber.  Liver:  No focal lesion identified. Within normal limits in parenchymal echogenicity.  IMPRESSION: No acute abdominal pathology.   Electronically Signed   By: Maryclare Bean M.D.   On: 08/13/2013 18:03         Subjective: Pt c/o singletus.  Patient denies fevers, chills, headache, chest pain, dyspnea, nausea, vomiting, diarrhea, abdominal pain, dysuria, hematuria Tolerating carbohydrate modified diet.  Objective: Filed Vitals:   08/16/13 1124 08/16/13 2110 08/17/13 0610 08/17/13 1316  BP: 116/77 105/74 108/69 93/65  Pulse: 100 98 99 117  Temp: 98 F (36.7 C) 98.1 F (36.7 C) 98.4 F (36.9 C) 98.6 F (37 C)  TempSrc: Oral Axillary Oral Oral  Resp: 16 16 16 16   Height:      Weight:   110.8 kg (244 lb 4.3 oz)   SpO2: 97% 100% 96% 94%    Intake/Output Summary (Last 24 hours) at 08/17/13 1609 Last data filed at 08/17/13 1529  Gross per 24 hour  Intake   1560 ml  Output   1350 ml  Net    210 ml   Weight change: 0.1 kg  (3.5 oz) Exam:   General:  Pt is alert, follows commands appropriately, not in acute distress  HEENT: No icterus, No thrush,  [place]/AT  Cardiovascular: RRR, S1/S2, no rubs, no gallops  Respiratory: CTA bilaterally, no wheezing, no crackles, no rhonchi  Abdomen: Soft/+BS, non tender, non distended, no guarding  Extremities: 1+ LE edema, No lymphangitis, No petechiae, No rashes, no synovitis  Data Reviewed: Basic Metabolic Panel:  Recent Labs Lab 08/13/13 0616 08/14/13 0500 08/15/13 0529 08/16/13 0445 08/17/13 0444  NA 138 137 135* 136* 137  K 4.6 4.6 4.3 4.5 4.4  CL 100 100 97 98 100  CO2 28 27 28 25 27   GLUCOSE 176* 195* 158* 148* 94  BUN 24* 27* 31* 31* 30*  CREATININE 1.00 1.00 1.04 1.14 1.15  CALCIUM 8.5 8.5 8.5 8.5 8.4  MG 2.4  --   --   --  2.2  PHOS 2.8  --   --   --  4.5   Liver Function Tests:  Recent Labs Lab 08/13/13 0616 08/14/13 0500 08/16/13 0445 08/17/13 0444  AST 147* 59* 50* 35  ALT 166* 118* 87* 74*  ALKPHOS 71 65 67 64  BILITOT 0.4 0.3 0.4 0.4  PROT 5.9* 5.7* 6.0 5.8*  ALBUMIN 2.6* 2.4* 2.6* 2.5*   No results found for this basename: LIPASE, AMYLASE,  in the last 168 hours No results found for this basename: AMMONIA,  in the last 168 hours CBC:  Recent Labs Lab 08/11/13 0630 08/17/13 0444  WBC 22.6* 15.9*  NEUTROABS  --  9.9*  HGB 10.8* 9.9*  HCT 35.2* 31.4*  MCV 79.1 78.9  PLT 387 294   Cardiac Enzymes: No results found for this basename: CKTOTAL, CKMB, CKMBINDEX, TROPONINI,  in the last 168 hours BNP: No components found with this basename: POCBNP,  CBG:  Recent Labs Lab 08/16/13 1115 08/16/13 1704 08/16/13 2341 08/17/13 0616 08/17/13 1148  GLUCAP 134* 140* 153* 101* 125*    Recent Results (from the past 240 hour(s))  SURGICAL PCR SCREEN     Status: None   Collection Time    08/10/13 12:29 AM      Result Value Ref Range Status   MRSA, PCR NEGATIVE  NEGATIVE Final   Staphylococcus aureus NEGATIVE  NEGATIVE Final     Comment:            The Xpert SA Assay (FDA     approved for NASAL specimens     in patients over 64 years of age),     is one component of     a comprehensive surveillance     program.  Test performance has     been validated by [employer] for patients greater     than or equal to 25 year old.     It is not intended     to diagnose infection nor to     guide or monitor treatment.     Scheduled Meds: . chlorhexidine  15 mL Mouth Rinse BID  . dutasteride  0.5 mg Oral Daily  . famotidine  20 mg Oral BID  . heparin  5,000 Units Subcutaneous 3 times per day  . insulin aspart  0-15 Units Subcutaneous TID WC  . insulin aspart  0-5 Units Subcutaneous QHS  . levothyroxine  175 mcg Oral QAC breakfast  . lisinopril  10 mg Oral Daily  . sodium chloride  3 mL Intravenous Q12H   Continuous Infusions: . sodium chloride 10 mL/hr at 08/16/13 1110  . Marland KitchenTPN (CLINIMIX-E) Adult 100 mL/hr at 08/16/13 1850   And  . fat emulsion 240 mL (08/16/13 1849)     Josely Moffat, Roderic, DO  [facility] Pager [phone]  If 7PM-7AM, please contact night-coverage [email or web address] Password [email or web address] 08/17/2013, 4:09 PM   LOS: 14 days

## 2013-08-17 NOTE — Progress Notes (Signed)
I have seen and examined the pt and agree with PA-Jenning's progress note. Reg diet Home in AM if con't to do well

## 2013-08-17 NOTE — Progress Notes (Signed)
Agree. Pryor Ochoa RD, LDN Pager: [phone] After Hours Pager: [phone]

## 2013-08-17 NOTE — Discharge Summary (Addendum)
Physician Discharge Summary  Bruce [name] MWN:[id number] DOB: Jun 23, 1950 DOA: 08/03/2013  PCP: [name], MD  Admit date: 08/03/2013 Discharge date: 08/18/13  Recommendations for Outpatient Follow-up:  1. Pt will need to follow up with PCP  2. Please obtain BMP and hepatic panel in 1 week 3. Follow up with Dr. Benson Norway in 2 weeks  Discharge Diagnoses:  Crohn's ileitis /Small bowel obstruction  -d/c cipro and flagyl (had 10 days)  -continue IV solumedrol-->discussed with Dr. 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 GI recommended surgical resection.  -PICC placed and beganTNA . -patient taken to OR 08/11/13 with laparoscopic small bowel resection.  -pathology-->BENIGN SMALL BOWEL AND COLONIC MUCOSA WITH SUBMUCOSAL SMOOTH MUSCLE  HYPERPLASIA.  -08/13/13--d/c PCA as pt is getting <80m Dilaudid over 24 hr period--discussed with surgery  -Postoperatively, the patient's bowel function was slow to return -He remained on TPN until 24 hours prior to his discharge -His diet was very gradually advanced until he was ultimately started on carbohydrate modified diet on 08/17/2013--he did not have any bowel movements or pass flatus until 08/16/2013. -[place] suggests post op pt will require anti-TNF or immunomodulator therapy to reduce the chance of recurrence at the anastamosis-pt states he wants to f/u with Dr. HBenson Norway -08/13/13 NG clamped--appreciate surgery followup  -08/14/13 NG removed, still no BM or flatus  -08/16/13--Pt had 4 BMs  -weaned prednisone off 08/17/13  -08/17/13-->carb modified diet, d/c TPN  -Stool for C.diff-negative- Transaminasemia  -Likely due to TPN  -Right upper quadrant ultrasound--neg  -Continue to trend-->trending down    Hyponatremia -Patient developed mild hyponatremia at the return of his bowel function with sodium 133 -History and creatinine slightly increased from 1.15-1.31  -This is likely due to the patient's numerous loose stools -C. difficile PCR was negative -The patient was given 1 L normal saline prior to discharge -Lisinopril was discontinued--f/u with PCP before restarting -The patient and his wife were instructed to get a repeat BMP within one week Acute respiratory failure with hypoxia/Sleep apnea  Resolved.  -likely from mechanical issues from recent distended abdomen and SBO  -cont O2  -CPAP per respiratory. pt refused saying does not use at home and actually gave away his machine.  Dehydration/tachycardia  -improved  Hypertension  -BP stable  -prn Hydralazine  -Hold lisinopril until followup with PCP Hypothyroidism  -continue IV Synthroid-->change to po  Leukocytosis  -Secondary to the Solu-Medrol.  -continue to wean steroids to prednisone  -d/c antibiotics  -no fevers, hemodynamically stable  Hyperglycemia with hx of impaired glucose intolerance  -HbA1C--7.3  -Continue NovoLog sliding scale  -Discussed with patient and wife at the bedside--> they have decided upon last modification rather than starting meds at this time -He will followup with his primary care physician who will help him make the ultimate decision.  Discharge Condition: Stable  Disposition: Home     Follow-up Information   Follow up with CORNETT,THOMAS A., MD On 08/24/2013. (For suture removal, you will see the nurse.  Be at the office 10:15 for 10:30 appointment.)    Specialty:  General Surgery   Contact information:   [place] [id number] [phone]      Follow up with CTurner Daniels, MD On 09/11/2013. (Be at the office  at [place], for a 9:10 AM appointment.)    Specialty:  General Surgery   Contact information:   [address] [place]  [address] [phone]       Call [name], MD. (As needed)    Specialty:  Internal Medicine   Contact information:   [address] [place] [address] [phone]       Follow up with Beryle Beams, MD. Schedule an appointment as soon as possible for a visit in 2 weeks. (Make an appointment for 1-2 weeks.)    Specialty:  Gastroenterology   Contact information:   [place], [place] [address] [phone]       Diet:carbohydrate modified Wt Readings from Last 3 Encounters:  08/17/13 110.8 kg (244 lb 4.3 oz)  08/17/13 110.8 kg (244 lb 4.3 oz)  06/30/13 116.6 kg (257 lb 0.9 oz)    History of present illness:  63 y.o. year old male with prior hx/o crohns disease (s/p multiple bowel surgeries), Prior intestipal perforation with diverticulitis and SBO 4/26-28/15 (1 episode 2010),  HTN who presented with worsening abdominal pain. Pt stated that he had severe sudden onset of abdominal pain, non bloody emesis and diarrhea while eating on day of admission.Recently admission 1 month prior for high grade SBO treated conservatively with NG decompression. Surgery recommended non operative management. Pt/wife felt that pain was significantly worse than previous admission, though pain much improved s/p NGT in ER. In the ER he was found to have tachycardia with rates in the 120s. WBC 14.5. Hgb 12.4. Cr 1.34. KUB w/ high grade SBO. Lactate 2.3. Lipase WNL. Patient admitted to stepdown and transferred to floor once stable. The patient was started on TPN with continued NG decompression. He was ultimately taken to the OR on 08/11/2013 for a laparoscopic small bowel resection. The patient remained on TPN as his bowel function continued to be slow to return even after the surgery. Unfortunately, the patient's bowel function was very slow to return postoperatively. He was maintained on TPN. The patient had his NG in place for nearly 3 days postoperatively. His diet was  gradually advanced. The patient had flatus and a bowel movement on 08/16/2013. His diet was advanced her carbohydrate modified which he tolerated. Gastroenterology was consulted and recommended treatment with steroids which have been weaned off prior to his discharge. They recommended starting immunomodulatory therapy for the patient's Crohn's disease in the outpatient setting once the patient is stable. Pathology from the patient's bowel resection showed BENIGN SMALL BOWEL AND COLONIC MUCOSA WITH SUBMUCOSAL SMOOTH MUSCLE  HYPERPLASIA.       Consultants: General Surgery GI--Dr. [place]  Discharge Exam: Filed Vitals:   08/18/13 1422  BP: 101/68  Pulse: 102  Temp: 98.5 F (36.9 C)  Resp: 18   Filed Vitals:   08/17/13 1316 08/17/13 2346 08/18/13 0500 08/18/13 1422  BP: 93/65 96/58 106/57 101/68  Pulse: 117 104 106 102  Temp: 98.6 F (37 C) 98.2 F (36.8 C) 97.8 F (36.6 C) 98.5 F (36.9 C)  TempSrc: Oral Oral Oral Oral  Resp: 16 18 18 18   Height:      Weight:      SpO2: 94% 96% 99% 97%   General: A&O x 3, NAD, pleasant, cooperative Cardiovascular: RRR, no rub, no gallop, no S3 Respiratory: CTAB, no wheeze, no rhonchi Abdomen:soft, nontender, nondistended, positive bowel sounds Extremities: 1+LE edema, No lymphangitis, no petechiae  Discharge Instructions     Medication  List    STOP taking these medications       lisinopril 10 MG tablet  Commonly known as:  PRINIVIL,ZESTRIL     mesalamine 500 MG CR capsule  Commonly known as:  PENTASA      TAKE these medications       aspirin 81 MG tablet  Take 81 mg by mouth daily.     AVODART 0.5 MG capsule  Generic drug:  dutasteride  Take 0.5 mg by mouth daily.     levothyroxine 175 MCG tablet  Commonly known as:  SYNTHROID, LEVOTHROID  Take 175 mcg by mouth daily.     polyethylene glycol packet  Commonly known as:  MIRALAX / GLYCOLAX  Take 17 g by mouth daily.         The results of significant  diagnostics from this hospitalization (including imaging, microbiology, ancillary and laboratory) are listed below for reference.    Significant Diagnostic Studies: X-ray Chest Pa And Lateral   08/05/2013   CLINICAL DATA:  diaphoresis  EXAM: CHEST  2 VIEW  COMPARISON:  Abdominal series dated 08/03/2013  FINDINGS: The heart size and mediastinal contours are within normal limits. Stable mild discoid atelectasis versus scarring left lung base. Lungs otherwise clear. NG tube has been inserted tip not view of this study. No acute osseous abnormalities.  IMPRESSION: No active cardiopulmonary disease.   Electronically Signed   By: Margaree Mackintosh M.D.   On: 08/05/2013 09:32   Ct Abdomen Pelvis W Contrast  08/04/2013   CLINICAL DATA:  Abdominal pain. History of small bowel obstruction. History of Crohn's disease.  EXAM: CT ABDOMEN AND PELVIS WITH CONTRAST  TECHNIQUE: Multidetector CT imaging of the abdomen and pelvis was performed using the standard protocol following bolus administration of intravenous contrast.  CONTRAST:  153m OMNIPAQUE IOHEXOL 300 MG/ML  SOLN  COMPARISON:  04/16/2013  FINDINGS: BODY WALL: Fatty umbilical and periumbilical hernias. Fatty enlargement of the bilateral inguinal canals.  LOWER CHEST: Mild atelectasis at the left base.  ABDOMEN/PELVIS:  Liver: No focal abnormality.  Biliary: Cholecystectomy.  Pancreas: Unremarkable.  Spleen: In place of the spleen, there are multiple homogeneously enhancing nodules throughout the peritoneal space, consistent with splenosis.  Adrenals: Unremarkable.  Kidneys and ureters: There are bilateral indeterminate renal cortical lesions. On the right, there is a 2 cm lesion in the posterior interpolar kidney that measures greater than water density at 50 Hounsfield units There is no definitive deenhancement. In the left kidney there are two somewhat ill-defined and mildly hyperdense lesions both measuring approximately 12 mm in the interpolar region.  Bladder:  Unremarkable.  Reproductive: Mild prostate enlargement.  Bowel: Multiple segments of dilated and fluid-filled small bowel leading up to the terminal ileum where there is abrupt transition point in a region of wall thickening. The mucosa in this region is avidly enhancing. There is alternating narrow and dilated segments of the terminal ileum. Small bowel obstruction causes reactive edema in the associated mesenteries and in the neighboring peritoneal compartment. No evidence of bowel perforation or devascularization. Nasogastric tube tip in the distal ligament. No pericecal inflammation.  Retroperitoneum: No mass or adenopathy.  Peritoneum: Splenosis as above.  Vascular: No acute abnormality.  OSSEOUS: No acute abnormalities.  IMPRESSION: 1. Small bowel obstruction at the level of the terminal ileum where there is active Crohn's enteritis. There could be a superimposed stenosis at this level. 2. Small bilateral indeterminate renal lesions, complex cyst versus solid mass. Due to patient size, ultrasound would be challenging; recommend  followup outpatient MRI (when the patient is able to cooperate with breathing instructions). 3. Splenosis.   Electronically Signed   By: Jorje Guild M.D.   On: 08/04/2013 05:26   Dg Abd 2 Views  08/09/2013   CLINICAL DATA:  Followup evaluation of small bowel obstruction.  EXAM: ABDOMEN - 2 VIEW  COMPARISON:  08/07/2013.  FINDINGS: Previously noted nasogastric tube has been removed. Surgical clips project over the right upper quadrant of the abdomen, compatible with prior cholecystectomy. Gas is noted throughout the colon. No distal rectal gas is identified at this time. No pathologic distention of small bowel. No pneumoperitoneum.  IMPRESSION: 1. Nonspecific, nonobstructive bowel gas pattern, as above. 2. No pneumoperitoneum.   Electronically Signed   By: Vinnie Langton M.D.   On: 08/09/2013 12:13   Dg Abd 2 Views  08/07/2013   CLINICAL DATA:  Follow up partial small bowel  obstruction.  EXAM: ABDOMEN - 2 VIEW  COMPARISON:  08/06/2013  FINDINGS: Enteric tube remains in place with tip in the expected region of the distal stomach or duodenal bulb. Right upper quadrant surgical clips are again seen. Residual oral contrast material in the left colon does not appear significantly changed. A few small air-fluid levels are again seen in the right mid to upper abdomen, slightly less prominent than on the prior study. There is otherwise a relative paucity of small bowel gas. No intraperitoneal free air is identified. Mild lumbar dextroscoliosis is again seen. Mild elevation the right hemidiaphragm does not appear significantly changed.  IMPRESSION: A few small air-fluid levels in the right abdomen, slightly less prominent than on the prior study and suggestive of improving partial small bowel obstruction.   Electronically Signed   By: Logan Bores   On: 08/07/2013 08:34   Dg Abd 2 Views  08/06/2013   CLINICAL DATA:  Evaluate for small bowel obstruction.  EXAM: ABDOMEN - 2 VIEW  COMPARISON:  08/04/2013 and 08/03/2013  FINDINGS: Nasogastric tube tip is near the duodenum bulb region. There is oral contrast in the left colon. Few gas-filled loops of bowel in the mid abdomen. Few air-fluid levels in the right upper abdomen. No evidence for free air.  IMPRESSION: The degree of small bowel obstruction has decreased since 08/03/2013. Oral contrast has advanced into the left colon. There continues to be a few gas-filled loops of small bowel and findings probably represent a partial small bowel obstruction.   Electronically Signed   By: Markus Daft M.D.   On: 08/06/2013 08:37   Dg Abd Acute W/chest  08/04/2013   CLINICAL DATA:  Abdominal pain and vomiting, history of small bowel obstruction.  EXAM: ACUTE ABDOMEN SERIES (ABDOMEN 2 VIEW & CHEST 1 VIEW)  COMPARISON:  Abdominal radiograph June 29, 2013.  FINDINGS: Cardiomediastinal silhouette is unremarkable. Strandy densities in left lung base. No  pleural effusions. No pneumothorax. Soft tissue planes and included osseous structures are nonsuspicious.  Gas distended small bowel to 4.4 cm with paucity of large bowel gas. Small bowel air-fluid levels at relatively uniform level on upright view. No intraperitoneal free air.  No intra-abdominal mass effect or pathologic calcifications. Surgical clips in the right abdomen may reflect cholecystectomy. Phleboliths in the pelvis. Soft tissue planes and included osseous structures are nonsuspicious.  IMPRESSION: No acute cardiopulmonary process.  Recurrent high-grade small bowel obstruction.   Electronically Signed   By: Elon Alas   On: 08/04/2013 00:14   US Abdomen Limited Ruq  08/13/2013   CLINICAL DATA:  Elevated  transaminases.  Status postcholecystectomy.  EXAM: US ABDOMEN LIMITED - RIGHT UPPER QUADRANT  COMPARISON:  None.  FINDINGS: Gallbladder:  Absent consistent with given history.  Common bile duct:  Diameter: 7 mm in caliber.  Liver:  No focal lesion identified. Within normal limits in parenchymal echogenicity.  IMPRESSION: No acute abdominal pathology.   Electronically Signed   By: Maryclare Bean M.D.   On: 08/13/2013 18:03     Microbiology: Recent Results (from the past 240 hour(s))  SURGICAL PCR SCREEN     Status: None   Collection Time    08/10/13 12:29 AM      Result Value Ref Range Status   MRSA, PCR NEGATIVE  NEGATIVE Final   Staphylococcus aureus NEGATIVE  NEGATIVE Final   Comment:            The Xpert SA Assay (FDA     approved for NASAL specimens     in patients over 46 years of age),     is one component of     a comprehensive surveillance     program.  Test performance has     been validated by [employer] for patients greater     than or equal to 70 year old.     It is not intended     to diagnose infection nor to     guide or monitor treatment.  CLOSTRIDIUM DIFFICILE BY PCR     Status: None   Collection Time    08/17/13  8:18 PM      Result Value Ref Range  Status   C difficile by pcr NEGATIVE  NEGATIVE Final     Labs: Basic Metabolic Panel:  Recent Labs Lab 08/13/13 0616 08/14/13 0500 08/15/13 0529 08/16/13 0445 08/17/13 0444 08/18/13 0500  NA 138 137 135* 136* 137 133*  K 4.6 4.6 4.3 4.5 4.4 5.2  CL 100 100 97 98 100 95*  CO2 28 27 28 25 27 24   GLUCOSE 176* 195* 158* 148* 94 93  BUN 24* 27* 31* 31* 30* 27*  CREATININE 1.00 1.00 1.04 1.14 1.15 1.31  CALCIUM 8.5 8.5 8.5 8.5 8.4 8.8  MG 2.4  --   --   --  2.2  --   PHOS 2.8  --   --   --  4.5  --    Liver Function Tests:  Recent Labs Lab 08/13/13 0616 08/14/13 0500 08/16/13 0445 08/17/13 0444  AST 147* 59* 50* 35  ALT 166* 118* 87* 74*  ALKPHOS 71 65 67 64  BILITOT 0.4 0.3 0.4 0.4  PROT 5.9* 5.7* 6.0 5.8*  ALBUMIN 2.6* 2.4* 2.6* 2.5*   No results found for this basename: LIPASE, AMYLASE,  in the last 168 hours No results found for this basename: AMMONIA,  in the last 168 hours CBC:  Recent Labs Lab 08/17/13 0444  WBC 15.9*  NEUTROABS 9.9*  HGB 9.9*  HCT 31.4*  MCV 78.9  PLT 294   Cardiac Enzymes: No results found for this basename: CKTOTAL, CKMB, CKMBINDEX, TROPONINI,  in the last 168 hours BNP: No components found with this basename: POCBNP,  CBG:  Recent Labs Lab 08/17/13 1148 08/17/13 1727 08/17/13 2335 08/18/13 0756 08/18/13 1152  GLUCAP 125* 143* 103* 133* 99    Time coordinating discharge:  Greater than 30 minutes  Signed:  Batool Majid, Jamontae, DO [facility] Pager: [phone] 08/18/2013, 3:11 PM

## 2013-08-17 NOTE — Progress Notes (Signed)
6 Days Post-Op  Subjective: He had 4 loose BM's yesterday.  Tolerated full liquids, abdomen is soft and he is feeling good.  Objective: Vital signs in last 24 hours: Temp:  [98 F (36.7 C)-98.4 F (36.9 C)] 98.4 F (36.9 C) (06/15 0610) Pulse Rate:  [98-100] 99 (06/15 0610) Resp:  [16] 16 (06/15 0610) BP: (105-116)/(69-77) 108/69 mmHg (06/15 0610) SpO2:  [96 %-100 %] 96 % (06/15 0610) Weight:  [110.8 kg (244 lb 4.3 oz)] 110.8 kg (244 lb 4.3 oz) (06/15 0610) Last BM Date: 08/16/13 4 BM's recorded. Afebrile, VSS WBC is still up, on steroids Full Liquids/TNA Intake/Output from previous day: 06/14 0701 - 06/15 0700 In: 2141.2 [P.O.:240; I.V.:581.2; TPN:1320] Out: 2750 [Urine:2750] Intake/Output this shift:    General appearance: alert, cooperative and no distress Resp: clear to auscultation bilaterally GI: soft, non-tender; bowel sounds normal; no masses,  no organomegaly and midline incision looks fine.  Lab Results:   Recent Labs  08/17/13 0444  WBC 15.9*  HGB 9.9*  HCT 31.4*  PLT 294    BMET  Recent Labs  08/16/13 0445 08/17/13 0444  NA 136* 137  K 4.5 4.4  CL 98 100  CO2 25 27  GLUCOSE 148* 94  BUN 31* 30*  CREATININE 1.14 1.15  CALCIUM 8.5 8.4   PT/INR No results found for this basename: LABPROT, INR,  in the last 72 hours   Recent Labs Lab 08/13/13 0616 08/14/13 0500 08/16/13 0445 08/17/13 0444  AST 147* 59* 50* 35  ALT 166* 118* 87* 74*  ALKPHOS 71 65 67 64  BILITOT 0.4 0.3 0.4 0.4  PROT 5.9* 5.7* 6.0 5.8*  ALBUMIN 2.6* 2.4* 2.6* 2.5*     Lipase     Component Value Date/Time   LIPASE 47 08/03/2013 2258     Studies/Results: No results found.  Medications: . chlorhexidine  15 mL Mouth Rinse BID  . dutasteride  0.5 mg Oral Daily  . heparin  5,000 Units Subcutaneous 3 times per day  . insulin aspart  0-15 Units Subcutaneous 4 times per day  . levothyroxine  175 mcg Oral QAC breakfast  . lisinopril  10 mg Oral Daily  . predniSONE   10 mg Oral Q breakfast  . sodium chloride  3 mL Intravenous Q12H     Small intestine, resection, terminal ileum and cecum  - BENIGN SMALL BOWEL AND COLONIC MUCOSA WITH SUBMUCOSAL SMOOTH MUSCLE  HYPERPLASIA.  - FIVE BENIGN LYMPH NODES (0/5).  - THE SURGICAL RESECTION MARGINS ARE HISTOLOGICALLY VIABLE.  Assessment/Plan  1. Small bowel obstruction secondary to stricture from Crohn disease involving the terminal ileum. Exploratory laparotomy with ileocecectomy. Thomas A. Cornett, M.D. 08/11/2013.  As noted above, path does not show Crohn's.  2. S/p cholecystectomy, splenectomy, Prior intestinal perforation with diverticulitis and SBO 4/26-28/15, 1 episode 2010. No prior colon or small bowel resection.  3. Hx of acute respiratory failure with hypoxia/Sleep apnea  4. Hypertension  5. Hypothyroid  6. Body mass index is 33.8  7. Hx of BPH  Plan:  Today is his last dose of prednisone, I have talked with DR. Tat and we are going to wean his TNA.  Go to a regular diet and if he does well he can go home tomorrow.  I will get him set up to have staples out early next week and then follow up with Dr.Cornett after that.       LOS: 14 days    Criselda Starke 08/17/2013

## 2013-08-17 NOTE — Progress Notes (Signed)
NUTRITION FOLLOW UP  Intervention:    TPN per Pharmacy   Continue Carb Modified diet per MD   RD to continue to monitor  Nutrition Dx:   Inadequate oral intake related to SBO as evidenced by NPO status; resolved   Goal:   TPN to meet >/= 90% of their estimated nutrition needs; met  Monitor:   PO intake, weight trends, labs  Assessment:   63 y.o. year old male with prior hx/o crohns disease (s/p multiple bowel surgeries), HTN who presented with abdominal pain. Pt stated that he had severe sudden onset of abdominal pain, non bloody emesis and diarrhea while eating earlier on day of admission.   Pt s/p the following on 6/9:  EXPLORATORY LAPAROTOMY (N/A)  SMALL BOWEL RESECTION (N/A)  Pt advanced to carb modified diet today 6/15. Pt states that he tolerated well with no abdominal pain or n/v. Pt reports good appetite.   Pt had 4 BMs 6/14.   Per MD note, pt to wean TPN . If continue to de well pt to go home tomorrow 6/16.   Pt currently receiving TPN: Clinimix E 5/15 at 100 ml/hr and continue IVFE at 10 ml/hr. TPN to provide 2184 kCal (100% of estimated needs) and 120 gm (100% of estimated needs) of protein daily.  CBG's (mg/dL): 153, 101, 125  Triglycerides WNL: 126 mg/dL Potassium, Mag, Phos WNL  Height: Ht Readings from Last 1 Encounters:  08/05/13 6' (1.829 m)    Weight Status:   Wt Readings from Last 1 Encounters:  08/17/13 244 lb 4.3 oz (110.8 kg)  08/03/13 256 lb 6.3 oz  Re-estimated needs:  Kcal: 2100-2300  Protein: 105-120 grams  Fluid: 2.7 L/day  Skin: generalized edema; intact   Diet Order: Carb Control   Intake/Output Summary (Last 24 hours) at 08/17/13 1524 Last data filed at 08/17/13 0205  Gross per 24 hour  Intake 1901.17 ml  Output   2200 ml  Net -298.83 ml    Last BM: 6/14   Labs:   Recent Labs Lab 08/13/13 0616  08/15/13 0529 08/16/13 0445 08/17/13 0444  NA 138  < > 135* 136* 137  K 4.6  < > 4.3 4.5 4.4  CL 100  < > 97 98 100   CO2 28  < > 28 25 27   BUN 24*  < > 31* 31* 30*  CREATININE 1.00  < > 1.04 1.14 1.15  CALCIUM 8.5  < > 8.5 8.5 8.4  MG 2.4  --   --   --  2.2  PHOS 2.8  --   --   --  4.5  GLUCOSE 176*  < > 158* 148* 94  < > = values in this interval not displayed.  CBG (last 3)   Recent Labs  08/16/13 2341 08/17/13 0616 08/17/13 1148  GLUCAP 153* 101* 125*    Scheduled Meds: . chlorhexidine  15 mL Mouth Rinse BID  . dutasteride  0.5 mg Oral Daily  . famotidine  20 mg Oral BID  . heparin  5,000 Units Subcutaneous 3 times per day  . insulin aspart  0-15 Units Subcutaneous TID WC  . insulin aspart  0-5 Units Subcutaneous QHS  . levothyroxine  175 mcg Oral QAC breakfast  . lisinopril  10 mg Oral Daily  . sodium chloride  3 mL Intravenous Q12H    Continuous Infusions: . sodium chloride 10 mL/hr at 08/16/13 1110  . Marland KitchenTPN (CLINIMIX-E) Adult 100 mL/hr at 08/16/13 1850   And  .  fat emulsion 240 mL (08/16/13 1849)    Carrolyn Leigh, BS Dietetic Intern Pager: [phone]

## 2013-08-17 NOTE — Progress Notes (Signed)
I have seen and examined the pt and agree with PA-jenning's progress note.

## 2013-08-18 LAB — BASIC METABOLIC PANEL
BUN: 27 mg/dL — ABNORMAL HIGH (ref 6–23)
CO2: 24 mEq/L (ref 19–32)
Calcium: 8.8 mg/dL (ref 8.4–10.5)
Chloride: 95 mEq/L — ABNORMAL LOW (ref 96–112)
Creatinine, Ser: 1.31 mg/dL (ref 0.50–1.35)
GFR calc Af Amer: 65 mL/min — ABNORMAL LOW (ref >90)
GFR calc non Af Amer: 56 mL/min — ABNORMAL LOW (ref >90)
Glucose, Bld: 93 mg/dL (ref 70–99)
Potassium: 5.2 mEq/L (ref 3.7–5.3)
Sodium: 133 mEq/L — ABNORMAL LOW (ref 137–147)

## 2013-08-18 LAB — GLUCOSE, CAPILLARY
Glucose-Capillary: 133 mg/dL — ABNORMAL HIGH (ref 70–99)
Glucose-Capillary: 99 mg/dL (ref 70–99)

## 2013-08-18 LAB — CLOSTRIDIUM DIFFICILE BY PCR: Toxigenic C. Difficile by PCR: NEGATIVE

## 2013-08-18 MED ORDER — SODIUM CHLORIDE 0.9 % IV SOLN
INTRAVENOUS | Status: DC
  Administered 2013-08-18: 11:00:00 via INTRAVENOUS

## 2013-08-18 MED ORDER — SODIUM CHLORIDE 0.9 % IV SOLN
Freq: Once | INTRAVENOUS | Status: DC
Start: 2013-08-18 — End: 2013-08-18

## 2013-08-18 NOTE — Progress Notes (Signed)
7 Days Post-Op  Subjective: He continues to improve, less loose stools last PM. Tolerating a diet.  C diff is pending.   Objective: Vital signs in last 24 hours: Temp:  [97.8 F (36.6 C)-98.6 F (37 C)] 97.8 F (36.6 C) (06/16 0500) Pulse Rate:  [104-117] 106 (06/16 0500) Resp:  [16-18] 18 (06/16 0500) BP: (93-106)/(57-65) 106/57 mmHg (06/16 0500) SpO2:  [94 %-99 %] 99 % (06/16 0500) Last BM Date: 08/17/13 716 PO  Carb modified diet 8 stools BP down some C diff pending Now off steroids Intake/Output from previous day: 06/15 0701 - 06/16 0700 In: 3202.3 [P.O.:716; I.V.:966.3; TPN:1520] Out: 2175 [Urine:2175] Intake/Output this shift:    General appearance: alert, cooperative and no distress GI: soft, non-tender; bowel sounds normal; no masses,  no organomegaly and still a bit sore, mid line incision looks fine.    Lab Results:   Recent Labs  08/17/13 0444  WBC 15.9*  HGB 9.9*  HCT 31.4*  PLT 294    BMET  Recent Labs  08/17/13 0444 08/18/13 0500  NA 137 133*  K 4.4 5.2  CL 100 95*  CO2 27 24  GLUCOSE 94 93  BUN 30* 27*  CREATININE 1.15 1.31  CALCIUM 8.4 8.8   PT/INR No results found for this basename: LABPROT, INR,  in the last 72 hours   Recent Labs Lab 08/13/13 0616 08/14/13 0500 08/16/13 0445 08/17/13 0444  AST 147* 59* 50* 35  ALT 166* 118* 87* 74*  ALKPHOS 71 65 67 64  BILITOT 0.4 0.3 0.4 0.4  PROT 5.9* 5.7* 6.0 5.8*  ALBUMIN 2.6* 2.4* 2.6* 2.5*     Lipase     Component Value Date/Time   LIPASE 47 08/03/2013 2258     Studies/Results: No results found.  Medications: . chlorhexidine  15 mL Mouth Rinse BID  . dutasteride  0.5 mg Oral Daily  . famotidine  20 mg Oral BID  . heparin  5,000 Units Subcutaneous 3 times per day  . insulin aspart  0-15 Units Subcutaneous TID WC  . insulin aspart  0-5 Units Subcutaneous QHS  . levothyroxine  175 mcg Oral QAC breakfast  . lisinopril  10 mg Oral Daily  . sodium chloride  3 mL Intravenous  Q12H     Small intestine, resection, terminal ileum and cecum  - BENIGN SMALL BOWEL AND COLONIC MUCOSA WITH SUBMUCOSAL SMOOTH MUSCLE  HYPERPLASIA.  - FIVE BENIGN LYMPH NODES (0/5).  - THE SURGICAL RESECTION MARGINS ARE HISTOLOGICALLY VIABLE.  Assessment/Plan  1. Small bowel obstruction secondary to stricture from Crohn disease involving the terminal ileum. Exploratory laparotomy with ileocecectomy. Thomas A. Cornett, M.D. 08/11/2013.  As noted above, path does not show Crohn's.  2. S/p cholecystectomy, splenectomy, Prior intestinal perforation with diverticulitis and SBO 4/26-28/15, 1 episode 2010. No prior colon or small bowel resection.  3. Hx of acute respiratory failure with hypoxia/Sleep apnea  4. Hypertension  5. Hypothyroid  6. Body mass index is 33.8  7. Hx of BPH  Plan:  From our standpoint he can go.  I have follow up appointment for staple removal on 6/22, and to see Dr. Brantley Stage 7/10, they are in the AVS, along with post op wound care instructions.    LOS: 15 days    JENNINGS,Bruce [name] 08/18/2013

## 2013-08-18 NOTE — Progress Notes (Signed)
c diff neg No n/v. Doesn't like hospital food Still with some loose stool  nad Soft, obese, not really tender. incison c/d/i  Stable Bowel movements are going to be off for several weeks -diarrhea/constipation Ok to discharge from our point of view See PA note  Leighton Ruff. Redmond Pulling, MD, FACS General, Bariatric, & Minimally Invasive Surgery [facility], [place]

## 2013-08-18 NOTE — Progress Notes (Signed)
Discussed discharge summary with pt and pt wife. Reviewed all medications. Pt did not have any further questions. Pt ready for discharge.

## 2013-08-24 ENCOUNTER — Ambulatory Visit (INDEPENDENT_AMBULATORY_CARE_PROVIDER_SITE_OTHER): Payer: Commercial Managed Care - PPO

## 2013-08-24 DIAGNOSIS — K56609 Unspecified intestinal obstruction, unspecified as to partial versus complete obstruction: Secondary | ICD-10-CM

## 2013-08-24 NOTE — Progress Notes (Signed)
Pt came in for nurse only to have staples removed. Pt is a s/p exploratory laparotomy with ileocecectomy by Dr Brantley Stage. The pt has been doing well with eating small meals and having BM's. The pt is just really low in energy still which I explained that this is normal due to the big surgery he has just been through. I removed all the staples and applied steri strips to the incision. The pt has a f/u appt with Dr Brantley Stage on 7/10 already in place. I advised pt if any changes to please call the office. The pt understands.

## 2013-09-03 ENCOUNTER — Ambulatory Visit (INDEPENDENT_AMBULATORY_CARE_PROVIDER_SITE_OTHER): Payer: 59 | Admitting: Internal Medicine

## 2013-09-03 ENCOUNTER — Other Ambulatory Visit (INDEPENDENT_AMBULATORY_CARE_PROVIDER_SITE_OTHER): Payer: 59

## 2013-09-03 ENCOUNTER — Encounter: Payer: Self-pay | Admitting: Internal Medicine

## 2013-09-03 VITALS — BP 110/78 | HR 80 | Temp 98.8°F | Resp 16 | Ht 72.0 in | Wt 245.0 lb

## 2013-09-03 DIAGNOSIS — D539 Nutritional anemia, unspecified: Secondary | ICD-10-CM

## 2013-09-03 DIAGNOSIS — K50018 Crohn's disease of small intestine with other complication: Secondary | ICD-10-CM

## 2013-09-03 DIAGNOSIS — D519 Vitamin B12 deficiency anemia, unspecified: Secondary | ICD-10-CM

## 2013-09-03 DIAGNOSIS — K5000 Crohn's disease of small intestine without complications: Secondary | ICD-10-CM

## 2013-09-03 DIAGNOSIS — E1165 Type 2 diabetes mellitus with hyperglycemia: Principal | ICD-10-CM

## 2013-09-03 DIAGNOSIS — I10 Essential (primary) hypertension: Secondary | ICD-10-CM

## 2013-09-03 DIAGNOSIS — D509 Iron deficiency anemia, unspecified: Secondary | ICD-10-CM

## 2013-09-03 DIAGNOSIS — N183 Chronic kidney disease, stage 3 unspecified: Secondary | ICD-10-CM

## 2013-09-03 DIAGNOSIS — D518 Other vitamin B12 deficiency anemias: Secondary | ICD-10-CM

## 2013-09-03 DIAGNOSIS — E039 Hypothyroidism, unspecified: Secondary | ICD-10-CM

## 2013-09-03 DIAGNOSIS — IMO0001 Reserved for inherently not codable concepts without codable children: Secondary | ICD-10-CM

## 2013-09-03 DIAGNOSIS — E119 Type 2 diabetes mellitus without complications: Secondary | ICD-10-CM

## 2013-09-03 DIAGNOSIS — L600 Ingrowing nail: Secondary | ICD-10-CM

## 2013-09-03 HISTORY — DX: Vitamin B12 deficiency anemia, unspecified: D51.9

## 2013-09-03 HISTORY — DX: Iron deficiency anemia, unspecified: D50.9

## 2013-09-03 LAB — COMPREHENSIVE METABOLIC PANEL
ALT: 29 U/L (ref 0–53)
AST: 29 U/L (ref 0–37)
Albumin: 3.3 g/dL — ABNORMAL LOW (ref 3.5–5.2)
Alkaline Phosphatase: 94 U/L (ref 39–117)
BUN: 12 mg/dL (ref 6–23)
CO2: 24 mEq/L (ref 19–32)
Calcium: 9 mg/dL (ref 8.4–10.5)
Chloride: 103 mEq/L (ref 96–112)
Creatinine, Ser: 1.2 mg/dL (ref 0.4–1.5)
GFR: 62.91 mL/min (ref >60.00)
Glucose, Bld: 117 mg/dL — ABNORMAL HIGH (ref 70–99)
Potassium: 3.8 mEq/L (ref 3.5–5.1)
Sodium: 136 mEq/L (ref 135–145)
Total Bilirubin: 0.5 mg/dL (ref 0.2–1.2)
Total Protein: 7.3 g/dL (ref 6.0–8.3)

## 2013-09-03 LAB — CBC WITH DIFFERENTIAL/PLATELET
Basophils Absolute: 0 10*3/uL (ref 0.0–0.1)
Basophils Relative: 0.3 % (ref 0.0–3.0)
Eosinophils Absolute: 0.1 10*3/uL (ref 0.0–0.7)
Eosinophils Relative: 1.7 % (ref 0.0–5.0)
HCT: 33.1 % — ABNORMAL LOW (ref 39.0–52.0)
Hemoglobin: 10.6 g/dL — ABNORMAL LOW (ref 13.0–17.0)
Lymphocytes Relative: 23 % (ref 12.0–46.0)
Lymphs Abs: 1.6 10*3/uL (ref 0.7–4.0)
MCHC: 31.9 g/dL (ref 30.0–36.0)
MCV: 77.6 fl — ABNORMAL LOW (ref 78.0–100.0)
Monocytes Absolute: 1 10*3/uL (ref 0.1–1.0)
Monocytes Relative: 13.5 % — ABNORMAL HIGH (ref 3.0–12.0)
Neutro Abs: 4.3 10*3/uL (ref 1.4–7.7)
Neutrophils Relative %: 61.5 % (ref 43.0–77.0)
Platelets: 643 10*3/uL — ABNORMAL HIGH (ref 150.0–400.0)
RBC: 4.27 Mil/uL (ref 4.22–5.81)
RDW: 18.6 % — ABNORMAL HIGH (ref 11.5–15.5)
WBC: 7.1 10*3/uL (ref 4.0–10.5)

## 2013-09-03 LAB — BASIC METABOLIC PANEL
BUN: 12 mg/dL (ref 6–23)
CO2: 24 mEq/L (ref 19–32)
Calcium: 9 mg/dL (ref 8.4–10.5)
Chloride: 103 mEq/L (ref 96–112)
Creatinine, Ser: 1.2 mg/dL (ref 0.4–1.5)
GFR: 62.91 mL/min (ref >60.00)
Glucose, Bld: 117 mg/dL — ABNORMAL HIGH (ref 70–99)
Potassium: 3.8 mEq/L (ref 3.5–5.1)
Sodium: 136 mEq/L (ref 135–145)

## 2013-09-03 LAB — IBC PANEL
Iron: 18 ug/dL — ABNORMAL LOW (ref 42–165)
Saturation Ratios: 4.8 % — ABNORMAL LOW (ref 20.0–50.0)
Transferrin: 269.6 mg/dL (ref 212.0–360.0)

## 2013-09-03 LAB — FOLATE: Folate: 16.9 ng/mL (ref >5.9)

## 2013-09-03 LAB — RETICULOCYTES
ABS Retic: 85.8 10*3/uL (ref 19.0–186.0)
RBC.: 4.29 MIL/uL (ref 4.22–5.81)
Retic Ct Pct: 2 % (ref 0.4–2.3)

## 2013-09-03 LAB — VITAMIN B12: Vitamin B-12: 259 pg/mL (ref 211–911)

## 2013-09-03 LAB — FERRITIN: Ferritin: 17.3 ng/mL — ABNORMAL LOW (ref 22.0–322.0)

## 2013-09-03 MED ORDER — CYANOCOBALAMIN-SALCAPROZATE 1000-100 MCG-MG PO TABS: 1.0000 | ORAL_TABLET | Freq: Every day | ORAL | Status: DC

## 2013-09-03 MED ORDER — LEVOTHYROXINE SODIUM 175 MCG PO TABS: 175.0000 ug | ORAL_TABLET | Freq: Every day | ORAL | Status: DC

## 2013-09-03 MED ORDER — FERRALET 90 90-1 MG PO TABS: 1.0000 | ORAL_TABLET | Freq: Every day | ORAL | Status: DC

## 2013-09-03 NOTE — Patient Instructions (Signed)
Type 2 Diabetes Mellitus, Adult Type 2 diabetes mellitus, often simply referred to as type 2 diabetes, is a long-lasting (chronic) disease. In type 2 diabetes, the pancreas does not make enough insulin (a hormone), the cells are less responsive to the insulin that is made (insulin resistance), or both. Normally, insulin moves sugars from food into the tissue cells. The tissue cells use the sugars for energy. The lack of insulin or the lack of normal response to insulin causes excess sugars to build up in the blood instead of going into the tissue cells. As a result, high blood sugar (hyperglycemia) develops. The effect of high sugar (glucose) levels can cause many complications. Type 2 diabetes was also previously called adult-onset diabetes but it can occur at any age.  RISK FACTORS  A person is predisposed to developing type 2 diabetes if someone in the family has the disease and also has one or more of the following primary risk factors:  Overweight.  An inactive lifestyle.  A history of consistently eating high-calorie foods. Maintaining a normal weight and regular physical activity can reduce the chance of developing type 2 diabetes. SYMPTOMS  A person with type 2 diabetes may not show symptoms initially. The symptoms of type 2 diabetes appear slowly. The symptoms include:  Increased thirst (polydipsia).  Increased urination (polyuria).  Increased urination during the night (nocturia).  Weight loss. This weight loss may be rapid.  Frequent, recurring infections.  Tiredness (fatigue).  Weakness.  Vision changes, such as blurred vision.  Fruity smell to your breath.  Abdominal pain.  Nausea or vomiting.  Cuts or bruises which are slow to heal.  Tingling or numbness in the hands or feet. DIAGNOSIS Type 2 diabetes is frequently not diagnosed until complications of diabetes are present. Type 2 diabetes is diagnosed when symptoms or complications are present and when blood  glucose levels are increased. Your blood glucose level may be checked by one or more of the following blood tests:  A fasting blood glucose test. You will not be allowed to eat for at least 8 hours before a blood sample is taken.  A random blood glucose test. Your blood glucose is checked at any time of the day regardless of when you ate.  A hemoglobin A1c blood glucose test. A hemoglobin A1c test provides information about blood glucose control over the previous 3 months.  An oral glucose tolerance test (OGTT). Your blood glucose is measured after you have not eaten (fasted) for 2 hours and then after you drink a glucose-containing beverage. TREATMENT   You may need to take insulin or diabetes medicine daily to keep blood glucose levels in the desired range.  If you use insulin, you may need to adjust the dosage depending on the carbohydrates that you eat with each meal or snack. The treatment goal is to maintain the before meal blood sugar (preprandial glucose) level at 70-130 mg/dL. HOME CARE INSTRUCTIONS   Have your hemoglobin A1c level checked twice a year.  Perform daily blood glucose monitoring as directed by your health care provider.  Monitor urine ketones when you are ill and as directed by your health care provider.  Take your diabetes medicine or insulin as directed by your health care provider to maintain your blood glucose levels in the desired range.  Never run out of diabetes medicine or insulin. It is needed every day.  If you are using insulin, you may need to adjust the amount of insulin given based on your intake  of carbohydrates. Carbohydrates can raise blood glucose levels but need to be included in your diet. Carbohydrates provide vitamins, minerals, and fiber which are an essential part of a healthy diet. Carbohydrates are found in fruits, vegetables, whole grains, dairy products, legumes, and foods containing added sugars.  Eat healthy foods. You should make an  appointment to see a registered dietitian to help you create an eating plan that is right for you.  Lose weight if overweight.  Carry a medical alert card or wear your medical alert jewelry.  Carry a 15 gram carbohydrate snack with you at all times to treat low blood glucose (hypoglycemia). Some examples of 15 gram carbohydrate snacks include:  Glucose tablets, 3 or 4  Raisins, 2 tablespoons (24 grams)  Jelly beans, 6  Animal crackers, 8  Regular pop, 4 ounces (120 mL)  Gummy treats, 9  Recognize hypoglycemia. Hypoglycemia occurs with blood glucose levels of 70 mg/dL and below. The risk for hypoglycemia increases when fasting or skipping meals, during or after intense exercise, and during sleep. Hypoglycemia symptoms can include:  Tremors or shakes.  Decreased ability to concentrate.  Sweating.  Increased heart rate.  Headache.  Dry mouth.  Hunger.  Irritability.  Anxiety.  Restless sleep.  Altered speech or coordination.  Confusion.  Treat hypoglycemia promptly. If you are alert and able to safely swallow, follow the 15:15 rule:  Take 15-20 grams of rapid-acting glucose or carbohydrate. Rapid-acting options include glucose gel, glucose tablets, or 4 ounces (120 mL) of fruit juice, regular soda, or low fat milk.  Check your blood glucose level 15 minutes after taking the glucose.  Take 15-20 grams more of glucose if the repeat blood glucose level is still 70 mg/dL or below.  Eat a meal or snack within 1 hour once blood glucose levels return to normal.  Be alert to feeling very thirsty and urinating more frequently than usual, which are early signs of hyperglycemia. An early awareness of hyperglycemia allows for prompt treatment. Treat hyperglycemia as directed by your health care provider.  Engage in at least 150 minutes of moderate-intensity physical activity a week, spread over at least 3 days of the week or as directed by your health care provider. In  addition, you should engage in resistance exercise at least 2 times a week or as directed by your health care provider.  Adjust your medicine and food intake as needed if you start a new exercise or sport.  Follow your sick day plan at any time you are unable to eat or drink as usual.  Avoid tobacco use.  Limit alcohol intake to no more than 1 drink per day for nonpregnant women and 2 drinks per day for men. You should drink alcohol only when you are also eating food. Talk with your health care provider whether alcohol is safe for you. Tell your health care provider if you drink alcohol several times a week.  Follow up with your health care provider regularly.  Schedule an eye exam soon after the diagnosis of type 2 diabetes and then annually.  Perform daily skin and foot care. Examine your skin and feet daily for cuts, bruises, redness, nail problems, bleeding, blisters, or sores. A foot exam by a health care provider should be done annually.  Brush your teeth and gums at least twice a day and floss at least once a day. Follow up with your dentist regularly.  Share your diabetes management plan with your workplace or school.  Stay up-to-date with  immunizations.  Learn to manage stress.  Obtain ongoing diabetes education and support as needed.  Participate in, or seek rehabilitation as needed to maintain or improve independence and quality of life. Request a physical or occupational therapy referral if you are having foot or hand numbness or difficulties with grooming, dressing, eating, or physical activity. SEEK MEDICAL CARE IF:   You are unable to eat food or drink fluids for more than 6 hours.  You have nausea and vomiting for more than 6 hours.  Your blood glucose level is over 240 mg/dL.  There is a change in mental status.  You develop an additional serious illness.  You have diarrhea for more than 6 hours.  You have been sick or have had a fever for a couple of days  and are not getting better.  You have pain during any physical activity.  SEEK IMMEDIATE MEDICAL CARE IF:  You have difficulty breathing.  You have moderate to large ketone levels. MAKE SURE YOU:  Understand these instructions.  Will watch your condition.  Will get help right away if you are not doing well or get worse. Document Released: 02/19/2005 Document Revised: 02/24/2013 Document Reviewed: 09/18/2011 [facility] Patient Information 2015 [place], [place]. This information is not intended to replace advice given to you by your health care provider. Make sure you discuss any questions you have with your health care provider.

## 2013-09-03 NOTE — Assessment & Plan Note (Signed)
His recent TSH was normal, he will stay on the current dose of synthroid

## 2013-09-03 NOTE — Assessment & Plan Note (Signed)
His blood sugars are adequately well controlled No meds are indicated at this time

## 2013-09-03 NOTE — Progress Notes (Signed)
Pre visit review using our clinic review tool, if applicable. No additional management support is needed unless otherwise documented below in the visit note. 

## 2013-09-03 NOTE — Assessment & Plan Note (Signed)
Podiatry referral

## 2013-09-03 NOTE — Assessment & Plan Note (Signed)
Will start ferralet to replace his iron deficiency

## 2013-09-03 NOTE — Progress Notes (Signed)
Subjective:    Patient ID: Bruce [name], male    DOB: 05/05/1950, 63 y.o.   MRN: [id number]  Anemia Presents for follow-up visit. Symptoms include malaise/fatigue. There has been no abdominal pain, anorexia, bruising/bleeding easily, confusion, fever, leg swelling, light-headedness, pallor, palpitations, paresthesias, pica or weight loss. Signs of blood loss that are present include hematochezia. Signs of blood loss that are not present include hematemesis and melena. Past treatments include nothing. Past medical history includes inflammatory bowel disease, recent illness and recent surgery.      Review of Systems  Constitutional: Positive for malaise/fatigue. Negative for fever, chills, weight loss, diaphoresis, appetite change and fatigue.  HENT: Negative.   Eyes: Negative.   Respiratory: Negative.  Negative for apnea, cough, choking, chest tightness, shortness of breath, wheezing and stridor.   Cardiovascular: Negative.  Negative for chest pain, palpitations and leg swelling.  Gastrointestinal: Positive for diarrhea, blood in stool and hematochezia. Negative for nausea, vomiting, abdominal pain, constipation, melena, abdominal distention, anal bleeding, rectal pain, anorexia and hematemesis.  Endocrine: Negative.   Genitourinary: Negative.   Musculoskeletal: Negative.  Negative for arthralgias, back pain, joint swelling, myalgias and neck stiffness.  Skin: Negative.  Negative for pallor and rash.  Allergic/Immunologic: Negative.   Neurological: Negative.  Negative for dizziness, tremors, speech difficulty, weakness, light-headedness, numbness, headaches and paresthesias.  Hematological: Negative.  Negative for adenopathy. Does not bruise/bleed easily.  Psychiatric/Behavioral: Negative.  Negative for confusion and sleep disturbance. The patient is not nervous/anxious.        Objective:   Physical Exam  Vitals reviewed. Constitutional: He is oriented to person, place, and time. He  appears well-developed and well-nourished. No distress.  HENT:  Head: Normocephalic and atraumatic.  Mouth/Throat: Oropharynx is clear and moist. No oropharyngeal exudate.  Eyes: Conjunctivae are normal. Right eye exhibits no discharge. Left eye exhibits no discharge. No scleral icterus.  Neck: Normal range of motion. Neck supple. No JVD present. No tracheal deviation present. No thyromegaly present.  Cardiovascular: Normal rate, regular rhythm, normal heart sounds and intact distal pulses.  Exam reveals no gallop and no friction rub.   No murmur heard. Pulmonary/Chest: Effort normal and breath sounds normal. No stridor. No respiratory distress. He has no wheezes. He has no rales. He exhibits no tenderness.  Abdominal: Soft. Bowel sounds are normal. He exhibits no distension and no mass. There is no tenderness. There is no rebound and no guarding.  Musculoskeletal: Normal range of motion. He exhibits no edema and no tenderness.  Lymphadenopathy:    He has no cervical adenopathy.  Neurological: He is oriented to person, place, and time.  Skin: Skin is warm and dry. No rash noted. He is not diaphoretic. No erythema. No pallor.  Psychiatric: He has a normal mood and affect. His behavior is normal. Judgment and thought content normal.     Lab Results  Component Value Date   WBC 15.9* 08/17/2013   HGB 9.9* 08/17/2013   HCT 31.4* 08/17/2013   PLT 294 08/17/2013   GLUCOSE 93 08/18/2013   CHOL 171 12/17/2012   TRIG 126 08/17/2013   HDL 34.90* 12/17/2012   LDLDIRECT 102.6 12/17/2012   LDLCALC 95 04/03/2012   ALT 74* 08/17/2013   AST 35 08/17/2013   NA 133* 08/18/2013   K 5.2 08/18/2013   CL 95* 08/18/2013   CREATININE 1.31 08/18/2013   BUN 27* 08/18/2013   CO2 24 08/18/2013   TSH 2.200 08/04/2013   PSA 1.84 12/17/2012   INR 1.05 08/07/2013  HGBA1C 7.3* 08/17/2013       Assessment & Plan:

## 2013-09-03 NOTE — Assessment & Plan Note (Signed)
He needs to f/up with GI to see if he can restart his meds for IBD

## 2013-09-03 NOTE — Assessment & Plan Note (Signed)
His BP is well controlled 

## 2013-09-03 NOTE — Assessment & Plan Note (Signed)
His renal function has improved

## 2013-09-03 NOTE — Assessment & Plan Note (Signed)
I have asked him to start an oral replacement for this Will recheck his B12 level in 2-3 months and if needed will change to a nasal spray or IM injection

## 2013-09-07 ENCOUNTER — Ambulatory Visit (INDEPENDENT_AMBULATORY_CARE_PROVIDER_SITE_OTHER): Payer: Commercial Managed Care - PPO | Admitting: General Surgery

## 2013-09-07 ENCOUNTER — Inpatient Hospital Stay (HOSPITAL_COMMUNITY)
Admission: AD | Admit: 2013-09-07 | Discharge: 2013-09-09 | DRG: 345 | Disposition: A | Discharge status: Home / Self Care | Payer: 59 | Source: Ambulatory Visit | Attending: Surgery | Admitting: Surgery

## 2013-09-07 ENCOUNTER — Encounter (HOSPITAL_COMMUNITY): Payer: Self-pay | Admitting: Radiology

## 2013-09-07 ENCOUNTER — Encounter (INDEPENDENT_AMBULATORY_CARE_PROVIDER_SITE_OTHER): Payer: Self-pay | Admitting: General Surgery

## 2013-09-07 ENCOUNTER — Inpatient Hospital Stay (HOSPITAL_COMMUNITY): Payer: 59

## 2013-09-07 VITALS — BP 124/78 | HR 112 | Temp 97.8°F | Ht 72.0 in | Wt 236.0 lb

## 2013-09-07 DIAGNOSIS — N183 Chronic kidney disease, stage 3 unspecified: Secondary | ICD-10-CM | POA: Diagnosis present

## 2013-09-07 DIAGNOSIS — D72829 Elevated white blood cell count, unspecified: Secondary | ICD-10-CM | POA: Diagnosis present

## 2013-09-07 DIAGNOSIS — K611 Rectal abscess: Secondary | ICD-10-CM | POA: Diagnosis present

## 2013-09-07 DIAGNOSIS — K5090 Crohn's disease, unspecified, without complications: Principal | ICD-10-CM | POA: Diagnosis present

## 2013-09-07 DIAGNOSIS — E119 Type 2 diabetes mellitus without complications: Secondary | ICD-10-CM | POA: Diagnosis present

## 2013-09-07 DIAGNOSIS — G4730 Sleep apnea, unspecified: Secondary | ICD-10-CM | POA: Diagnosis present

## 2013-09-07 DIAGNOSIS — E669 Obesity, unspecified: Secondary | ICD-10-CM | POA: Diagnosis present

## 2013-09-07 DIAGNOSIS — D509 Iron deficiency anemia, unspecified: Secondary | ICD-10-CM | POA: Diagnosis present

## 2013-09-07 DIAGNOSIS — K612 Anorectal abscess: Secondary | ICD-10-CM

## 2013-09-07 DIAGNOSIS — E039 Hypothyroidism, unspecified: Secondary | ICD-10-CM | POA: Diagnosis present

## 2013-09-07 DIAGNOSIS — I10 Essential (primary) hypertension: Secondary | ICD-10-CM | POA: Diagnosis present

## 2013-09-07 DIAGNOSIS — I129 Hypertensive chronic kidney disease with stage 1 through stage 4 chronic kidney disease, or unspecified chronic kidney disease: Secondary | ICD-10-CM | POA: Diagnosis present

## 2013-09-07 DIAGNOSIS — Z6831 Body mass index (BMI) 31.0-31.9, adult: Secondary | ICD-10-CM

## 2013-09-07 DIAGNOSIS — N400 Enlarged prostate without lower urinary tract symptoms: Secondary | ICD-10-CM | POA: Diagnosis present

## 2013-09-07 DIAGNOSIS — Z87891 Personal history of nicotine dependence: Secondary | ICD-10-CM

## 2013-09-07 LAB — TSH: TSH: 2.4 u[IU]/mL (ref 0.350–4.500)

## 2013-09-07 LAB — URINE MICROSCOPIC-ADD ON

## 2013-09-07 LAB — URINALYSIS, ROUTINE W REFLEX MICROSCOPIC
Glucose, UA: NEGATIVE mg/dL
Hgb urine dipstick: NEGATIVE
Ketones, ur: 15 mg/dL — AB
Leukocytes, UA: NEGATIVE
Nitrite: NEGATIVE
Protein, ur: 30 mg/dL — AB
Specific Gravity, Urine: 1.026 (ref 1.005–1.030)
Urobilinogen, UA: 0.2 mg/dL (ref 0.0–1.0)
pH: 5.5 (ref 5.0–8.0)

## 2013-09-07 LAB — CBC WITH DIFFERENTIAL/PLATELET
Basophils Absolute: 0 10*3/uL (ref 0.0–0.1)
Basophils Relative: 0 % (ref 0–1)
Eosinophils Absolute: 0 10*3/uL (ref 0.0–0.7)
Eosinophils Relative: 0 % (ref 0–5)
HCT: 31.6 % — ABNORMAL LOW (ref 39.0–52.0)
Hemoglobin: 9.9 g/dL — ABNORMAL LOW (ref 13.0–17.0)
Lymphocytes Relative: 16 % (ref 12–46)
Lymphs Abs: 1.9 10*3/uL (ref 0.7–4.0)
MCH: 24.6 pg — ABNORMAL LOW (ref 26.0–34.0)
MCHC: 31.3 g/dL (ref 30.0–36.0)
MCV: 78.4 fL (ref 78.0–100.0)
Monocytes Absolute: 1.6 10*3/uL — ABNORMAL HIGH (ref 0.1–1.0)
Monocytes Relative: 14 % — ABNORMAL HIGH (ref 3–12)
Neutro Abs: 8.2 10*3/uL — ABNORMAL HIGH (ref 1.7–7.7)
Neutrophils Relative %: 70 % (ref 43–77)
Platelets: 657 10*3/uL — ABNORMAL HIGH (ref 150–400)
RBC: 4.03 MIL/uL — ABNORMAL LOW (ref 4.22–5.81)
RDW: 17.4 % — ABNORMAL HIGH (ref 11.5–15.5)
WBC: 11.7 10*3/uL — ABNORMAL HIGH (ref 4.0–10.5)

## 2013-09-07 LAB — COMPREHENSIVE METABOLIC PANEL
ALT: 13 U/L (ref 0–53)
AST: 15 U/L (ref 0–37)
Albumin: 2.9 g/dL — ABNORMAL LOW (ref 3.5–5.2)
Alkaline Phosphatase: 112 U/L (ref 39–117)
Anion gap: 15 (ref 5–15)
BUN: 15 mg/dL (ref 6–23)
CO2: 23 mEq/L (ref 19–32)
Calcium: 8.6 mg/dL (ref 8.4–10.5)
Chloride: 97 mEq/L (ref 96–112)
Creatinine, Ser: 1.09 mg/dL (ref 0.50–1.35)
GFR calc Af Amer: 81 mL/min — ABNORMAL LOW (ref >90)
GFR calc non Af Amer: 70 mL/min — ABNORMAL LOW (ref >90)
Glucose, Bld: 86 mg/dL (ref 70–99)
Potassium: 4 mEq/L (ref 3.7–5.3)
Sodium: 135 mEq/L — ABNORMAL LOW (ref 137–147)
Total Bilirubin: 0.2 mg/dL — ABNORMAL LOW (ref 0.3–1.2)
Total Protein: 7.2 g/dL (ref 6.0–8.3)

## 2013-09-07 LAB — GLUCOSE, CAPILLARY: Glucose-Capillary: 105 mg/dL — ABNORMAL HIGH (ref 70–99)

## 2013-09-07 MED ORDER — PIPERACILLIN-TAZOBACTAM 3.375 G IVPB
3.3750 g | Freq: Three times a day (TID) | INTRAVENOUS | Status: DC
  Administered 2013-09-07 – 2013-09-09 (×5): 3.375 g via INTRAVENOUS
  Filled 2013-09-07 (×8): qty 50

## 2013-09-07 MED ORDER — IOHEXOL 300 MG/ML  SOLN
100.0000 mL | Freq: Once | INTRAMUSCULAR | Status: AC | PRN
  Administered 2013-09-07: 100 mL via INTRAVENOUS

## 2013-09-07 MED ORDER — SODIUM CHLORIDE 0.9 % IV SOLN
INTRAVENOUS | Status: DC
  Administered 2013-09-07 – 2013-09-08 (×2): via INTRAVENOUS

## 2013-09-07 MED ORDER — PANTOPRAZOLE SODIUM 40 MG IV SOLR
40.0000 mg | Freq: Every day | INTRAVENOUS | Status: DC
  Administered 2013-09-07 – 2013-09-08 (×2): 40 mg via INTRAVENOUS
  Filled 2013-09-07 (×3): qty 40

## 2013-09-07 MED ORDER — HYDROMORPHONE HCL PF 1 MG/ML IJ SOLN
1.0000 mg | INTRAMUSCULAR | Status: DC | PRN
  Administered 2013-09-07 – 2013-09-09 (×8): 1 mg via INTRAVENOUS
  Filled 2013-09-07 (×8): qty 1

## 2013-09-07 MED ORDER — ONDANSETRON HCL 4 MG/2ML IJ SOLN: 4.0000 mg | Freq: Four times a day (QID) | INTRAMUSCULAR | Status: DC | PRN

## 2013-09-07 MED ORDER — DUTASTERIDE 0.5 MG PO CAPS
0.5000 mg | ORAL_CAPSULE | Freq: Every day | ORAL | Status: DC
  Administered 2013-09-09: 0.5 mg via ORAL
  Filled 2013-09-07 (×2): qty 1

## 2013-09-07 MED ORDER — IOHEXOL 300 MG/ML  SOLN
25.0000 mL | INTRAMUSCULAR | Status: AC
  Administered 2013-09-07 (×2): 25 mL via ORAL

## 2013-09-07 MED ORDER — LEVOTHYROXINE SODIUM 150 MCG PO TABS
150.0000 ug | ORAL_TABLET | Freq: Every day | ORAL | Status: DC
  Filled 2013-09-07 (×2): qty 1

## 2013-09-07 NOTE — Progress Notes (Signed)
Patient ID: Bruce [name], male   DOB: Jul 15, 1950, 63 y.o.   MRN: [id number] This patient was seen in the office today and by history and physical exam has a high intramural rectal abscess posteriorly. This is not amenable to external drainage in the office.He is 3 weeks postop  laparotomy and ileocecal resection for Crohn's disease by Dr. Brantley Stage  To be sure that he is managed properly he will be admitted to [facility], started on IV Zosyn, CT scan will be performed to make sure he doesn't have a low pelvic abscess, and he will be reassessed this evening by Dr. Georganna Skeans, who is aware.   Edsel Petrin. Dalbert Batman, M.D., [facility] General and Minimally invasive Surgery Breast and Colorectal Surgery Office:   [phone] Pager:   [phone]

## 2013-09-07 NOTE — Progress Notes (Addendum)
Direct admit from MD office complaining of pain on his rectal area, per report patient have a rectal abscess, upon admission, patient is alert and oriented, not in any distress, ambulatory, pleasant, VSS, refuse rectal area to be checked and stated that I can"t see anything since the abscess was just diagnosed through rectal exam, denies any discharge. Will continue to monitor.

## 2013-09-07 NOTE — H&P (Signed)
Bruce [name] is an 63 y.o. male.   Chief Complaint: Rectal pain for 3 days  HPI: This is a 63 year old Caucasian gentleman who was recently discharged from the hospital following ileocecal resection for Crohn's disease with obstruction. Dr. Brantley [name] perform this operation. He seems to be recovering without any major problem. They were to eat a low appetite is not great. His bowels have been moving. There were no wound problems.  He is here today with his wife in the urgent office stating he has a three-day history of rectal pain. She's never had pain like this before although he has had a pilonidal abscess in the past. He hasn't seen any blood or. Labs. No fever or chills.  Comorbidities include Crohn's disease, hypertension, chronic kidney disease, diet-controlled diabetes, hypertension, hypothyroidism, chronic  Past Medical History  Diagnosis Date  . Crohn's disease   . Hypertension   . Sleep apnea   . Prostate enlargement     Past Surgical History  Procedure Laterality Date  . Nasal sinus surgery    . Cholecystectomy    . Colonoscopy    . 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 Seventh nerve face    . Colonoscopy  01/19/2011    Procedure: COLONOSCOPY;  Surgeon: Bruce Houston, MD;  Location: [facility];  Service: Endoscopy;  Laterality: N/A;  9:30 am  . Colon surgery    . Laparotomy N/A 08/11/2013    Procedure: EXPLORATORY LAPAROTOMY ;  Surgeon: Bruce Faster. Cornett, MD;  Location: [facility];  Service: General;  Laterality: N/A;  . Bowel resection N/A 08/11/2013    Procedure: SMALL BOWEL RESECTION;  Surgeon: Bruce Faster. Cornett, MD;  Location: [facility];  Service: General;  Laterality: N/A;    Family History  Problem Relation Age of Onset  . Colon cancer Mother   . Colon cancer Sister   . Healthy Sister   . Healthy Sister   . Crohn's disease Daughter   . Healthy Son   . Arthritis Other   . Heart disease Other   . Cancer Other     Colon, Uterine and Prostate Cancer    Social History:  reports that he quit smoking about 7 years ago. His smoking use included Cigarettes. He has a 120 pack-year smoking history. He has never used smokeless tobacco. He reports that he does not drink alcohol or use illicit drugs.  Allergies:  Allergies  Allergen Reactions  . Coconut Flavor Other (See Comments)    Does not eat because of crohn's      (Not in a hospital admission)  No results found for this or any previous visit (from the past 48 hour(s)). No results found.  Review of Systems  Constitutional: Negative for fever and chills.  HENT: Negative for ear discharge and sore throat.   Eyes: Negative for blurred vision and double vision.  Respiratory: Negative for cough, hemoptysis, sputum production and shortness of breath.   Cardiovascular: Negative for palpitations and orthopnea.  Gastrointestinal: Negative for nausea, vomiting, diarrhea, constipation and blood in stool.  Skin: Negative for rash.  Neurological: Negative for headaches.  Psychiatric/Behavioral: Negative for depression, suicidal ideas and substance abuse. The patient is nervous/anxious.     Blood pressure 124/78, pulse 112, temperature 97.8 F (36.6 C), height 6' (1.829 m), weight 236 lb (107.049 kg). Physical Exam  Constitutional: He is oriented to person, place, and time. He appears well-nourished. No distress.  Slightly deconditioned.  HENT:  Head:  Normocephalic and atraumatic.  Mouth/Throat: No oropharyngeal exudate.  Eyes: Right eye exhibits no discharge. Left eye exhibits no discharge. No scleral icterus.  Neck: Normal range of motion. Neck supple. No tracheal deviation present. No thyromegaly present.  Cardiovascular: Normal rate, regular rhythm and normal heart sounds.   No murmur heard. Respiratory: Effort normal and breath sounds normal. No respiratory distress. He has no wheezes. He has no rales.  GI: Soft. Bowel sounds are normal. He exhibits no distension and no mass. There is  no tenderness. There is no rebound and no guarding.  Recent midline incision healing normally. No sign of infection or hernia.  Genitourinary:  External rectal exam is normal. External palpation reveals a mass posteriorly but no cellulitis. Digital exam reveals a fairly tight sphincter but I could do a complete exam and there feels like there is intramural abscess posteriorly high up. This is very tender. No purulence. Well-healed pilonidal scars.  Neurological: He is alert and oriented to person, place, and time. No cranial nerve deficit. Coordination normal.  Skin: Skin is warm and dry. He is not diaphoretic.  Psychiatric: He has a normal mood and affect. His behavior is normal. Judgment and thought content normal.     Assessment/Plan Painful intramural rectal mass, posterior midline. Suspect he may be developing intramural rectal abscess. This is not drainable externally. Further evaluation and possible exam under anesthesia, drainage under anesthesia may be required. Not currently septic.  Admit to [facility], M.D. Service. N.p.o. IV antibiotics. CT scan pelvis to confirm clinical impression. Discuss with Dr. Georgette Dover who will discuss with Dr. Grandville Silos who is on-call tonight  Crohn's disease, 3 weeks postop laparotomy, ileocecal resection, no apparent intra-abdominal complications  Protein calorie malnutrition, recent TNA  Mild sleep apnea, does not use a CPAP at home  Hypertension  Hypothyroidism  Hyperglycemia with history of impaired glucose tolerance, on no medications  Bruce [name] M 09/07/2013, 4:30 PM

## 2013-09-08 ENCOUNTER — Encounter (HOSPITAL_COMMUNITY): Admission: AD | Disposition: A | Discharge status: Home / Self Care | Payer: Self-pay | Source: Ambulatory Visit

## 2013-09-08 ENCOUNTER — Inpatient Hospital Stay (HOSPITAL_COMMUNITY): Payer: 59 | Admitting: Certified Registered Nurse Anesthetist

## 2013-09-08 ENCOUNTER — Encounter (HOSPITAL_COMMUNITY): Payer: 59 | Admitting: Certified Registered Nurse Anesthetist

## 2013-09-08 ENCOUNTER — Encounter (HOSPITAL_COMMUNITY): Payer: Self-pay | Admitting: *Deleted

## 2013-09-08 DIAGNOSIS — K612 Anorectal abscess: Secondary | ICD-10-CM

## 2013-09-08 HISTORY — PX: INCISION AND DRAINAGE PERIRECTAL ABSCESS: SHX1804

## 2013-09-08 LAB — GLUCOSE, CAPILLARY
Glucose-Capillary: 100 mg/dL — ABNORMAL HIGH (ref 70–99)
Glucose-Capillary: 110 mg/dL — ABNORMAL HIGH (ref 70–99)

## 2013-09-08 SURGERY — INCISION AND DRAINAGE, ABSCESS, PERIRECTAL
Anesthesia: General | Site: Rectum

## 2013-09-08 MED ORDER — SODIUM CHLORIDE 0.9 % IJ SOLN
INTRAMUSCULAR | Status: AC
  Filled 2013-09-08: qty 10

## 2013-09-08 MED ORDER — DUTASTERIDE 0.5 MG PO CAPS
0.5000 mg | ORAL_CAPSULE | Freq: Every day | ORAL | Status: DC
  Administered 2013-09-08: 0.5 mg via ORAL

## 2013-09-08 MED ORDER — ROCURONIUM BROMIDE 50 MG/5ML IV SOLN
INTRAVENOUS | Status: AC
  Filled 2013-09-08: qty 1

## 2013-09-08 MED ORDER — 0.9 % SODIUM CHLORIDE (POUR BTL) OPTIME
TOPICAL | Status: DC | PRN
  Administered 2013-09-08: 1000 mL

## 2013-09-08 MED ORDER — HYDROMORPHONE HCL PF 1 MG/ML IJ SOLN
0.5000 mg | INTRAMUSCULAR | Status: AC | PRN
  Administered 2013-09-08 (×4): 0.5 mg via INTRAVENOUS

## 2013-09-08 MED ORDER — ONDANSETRON HCL 4 MG/2ML IJ SOLN
INTRAMUSCULAR | Status: AC
  Filled 2013-09-08: qty 2

## 2013-09-08 MED ORDER — HYDROMORPHONE HCL PF 1 MG/ML IJ SOLN
INTRAMUSCULAR | Status: AC
  Filled 2013-09-08: qty 1

## 2013-09-08 MED ORDER — FENTANYL CITRATE 0.05 MG/ML IJ SOLN
INTRAMUSCULAR | Status: AC
  Filled 2013-09-08: qty 5

## 2013-09-08 MED ORDER — HEMOSTATIC AGENTS (NO CHARGE) OPTIME
TOPICAL | Status: DC | PRN
  Administered 2013-09-08: 1 via TOPICAL

## 2013-09-08 MED ORDER — ONDANSETRON HCL 4 MG/2ML IJ SOLN
INTRAMUSCULAR | Status: DC | PRN
  Administered 2013-09-08: 4 mg via INTRAVENOUS

## 2013-09-08 MED ORDER — EPHEDRINE SULFATE 50 MG/ML IJ SOLN
INTRAMUSCULAR | Status: AC
  Filled 2013-09-08: qty 1

## 2013-09-08 MED ORDER — SUCCINYLCHOLINE CHLORIDE 20 MG/ML IJ SOLN
INTRAMUSCULAR | Status: AC
  Filled 2013-09-08: qty 1

## 2013-09-08 MED ORDER — FENTANYL CITRATE 0.05 MG/ML IJ SOLN
INTRAMUSCULAR | Status: DC | PRN
  Administered 2013-09-08: 100 ug via INTRAVENOUS
  Administered 2013-09-08 (×3): 50 ug via INTRAVENOUS

## 2013-09-08 MED ORDER — ACETAMINOPHEN 500 MG PO TABS: 1000.0000 mg | ORAL_TABLET | Freq: Three times a day (TID) | ORAL | Status: DC | PRN

## 2013-09-08 MED ORDER — LEVOTHYROXINE SODIUM 175 MCG PO TABS
175.0000 ug | ORAL_TABLET | Freq: Every day | ORAL | Status: DC
  Administered 2013-09-08 – 2013-09-09 (×2): 175 ug via ORAL
  Filled 2013-09-08 (×3): qty 1

## 2013-09-08 MED ORDER — HYDROMORPHONE HCL PF 1 MG/ML IJ SOLN
INTRAMUSCULAR | Status: AC
  Administered 2013-09-08: 1 mg
  Filled 2013-09-08: qty 1

## 2013-09-08 MED ORDER — LIDOCAINE HCL (CARDIAC) 20 MG/ML IV SOLN
INTRAVENOUS | Status: DC | PRN
  Administered 2013-09-08: 100 mg via INTRAVENOUS

## 2013-09-08 MED ORDER — PROPOFOL 10 MG/ML IV BOLUS
INTRAVENOUS | Status: DC | PRN
  Administered 2013-09-08: 150 mg via INTRAVENOUS

## 2013-09-08 MED ORDER — LACTATED RINGERS IV SOLN
INTRAVENOUS | Status: DC | PRN
  Administered 2013-09-08: 09:00:00 via INTRAVENOUS

## 2013-09-08 MED ORDER — LACTATED RINGERS IV SOLN
INTRAVENOUS | Status: DC
  Administered 2013-09-08: 09:00:00 via INTRAVENOUS

## 2013-09-08 MED ORDER — PROPOFOL 10 MG/ML IV BOLUS
INTRAVENOUS | Status: AC
  Filled 2013-09-08: qty 20

## 2013-09-08 MED ORDER — MIDAZOLAM HCL 2 MG/2ML IJ SOLN
INTRAMUSCULAR | Status: AC
  Filled 2013-09-08: qty 2

## 2013-09-08 MED ORDER — LIDOCAINE HCL (CARDIAC) 20 MG/ML IV SOLN
INTRAVENOUS | Status: AC
  Filled 2013-09-08: qty 5

## 2013-09-08 MED ORDER — SODIUM CHLORIDE 0.9 % IV SOLN
INTRAVENOUS | Status: DC
  Administered 2013-09-08 – 2013-09-09 (×2): via INTRAVENOUS

## 2013-09-08 SURGICAL SUPPLY — 35 items
BANDAGE GAUZE ELAST BULKY 4 IN (GAUZE/BANDAGES/DRESSINGS) IMPLANT
COVER MAYO STAND STRL (DRAPES) ×2 IMPLANT
COVER SURGICAL LIGHT HANDLE (MISCELLANEOUS) ×2 IMPLANT
DRAPE UTILITY 15X26 W/TAPE STR (DRAPE) ×4 IMPLANT
DRSG PAD ABDOMINAL 8X10 ST (GAUZE/BANDAGES/DRESSINGS) ×1 IMPLANT
ELECT CAUTERY BLADE 6.4 (BLADE) ×2 IMPLANT
ELECT REM PT RETURN 9FT ADLT (ELECTROSURGICAL) ×2
ELECTRODE REM PT RTRN 9FT ADLT (ELECTROSURGICAL) ×1 IMPLANT
GLOVE BIO SURGEON STRL SZ7 (GLOVE) ×2 IMPLANT
GLOVE BIOGEL PI IND STRL 7.0 (GLOVE) IMPLANT
GLOVE BIOGEL PI IND STRL 7.5 (GLOVE) ×1 IMPLANT
GLOVE BIOGEL PI INDICATOR 7.0 (GLOVE) ×2
GLOVE BIOGEL PI INDICATOR 7.5 (GLOVE) ×1
GLOVE SURG SS PI 7.0 STRL IVOR (GLOVE) ×1 IMPLANT
GOWN STRL REUS W/ TWL LRG LVL3 (GOWN DISPOSABLE) ×2 IMPLANT
GOWN STRL REUS W/TWL LRG LVL3 (GOWN DISPOSABLE) ×4
KIT BASIN OR (CUSTOM PROCEDURE TRAY) ×2 IMPLANT
KIT ROOM TURNOVER OR (KITS) ×2 IMPLANT
NS IRRIG 1000ML POUR BTL (IV SOLUTION) ×2 IMPLANT
PACK LITHOTOMY IV (CUSTOM PROCEDURE TRAY) ×2 IMPLANT
PAD ABD 8X10 STRL (GAUZE/BANDAGES/DRESSINGS) ×1 IMPLANT
PAD ARMBOARD 7.5X6 YLW CONV (MISCELLANEOUS) ×4 IMPLANT
PENCIL BUTTON HOLSTER BLD 10FT (ELECTRODE) ×2 IMPLANT
SPONGE GAUZE 4X4 12PLY (GAUZE/BANDAGES/DRESSINGS) IMPLANT
SPONGE LAP 18X18 X RAY DECT (DISPOSABLE) ×2 IMPLANT
SPONGE SURGIFOAM ABS GEL SZ50 (HEMOSTASIS) ×1 IMPLANT
SURGILUBE 2OZ TUBE FLIPTOP (MISCELLANEOUS) ×1 IMPLANT
SWAB COLLECTION DEVICE MRSA (MISCELLANEOUS) ×1 IMPLANT
SYR BULB 3OZ (MISCELLANEOUS) ×2 IMPLANT
TOWEL OR 17X24 6PK STRL BLUE (TOWEL DISPOSABLE) ×2 IMPLANT
TOWEL OR 17X26 10 PK STRL BLUE (TOWEL DISPOSABLE) ×2 IMPLANT
TUBE ANAEROBIC SPECIMEN COL (MISCELLANEOUS) ×1 IMPLANT
TUBE CONNECTING 12X1/4 (SUCTIONS) ×2 IMPLANT
WATER STERILE IRR 1000ML POUR (IV SOLUTION) IMPLANT
YANKAUER SUCT BULB TIP NO VENT (SUCTIONS) ×2 IMPLANT

## 2013-09-08 NOTE — Transfer of Care (Signed)
Immediate Anesthesia Transfer of Care Note  Patient: Bruce [name]  Procedure(s) Performed: Procedure(s): IRRIGATION AND DEBRIDEMENT PERIRECTAL ABSCESS (N/A)  Patient Location: PACU  Anesthesia Type:General  Level of Consciousness: awake, alert  and oriented  Airway & Oxygen Therapy: Patient Spontanous Breathing and Patient connected to nasal cannula oxygen  Post-op Assessment: Report given to PACU RN, Post -op Vital signs reviewed and stable and Patient moving all extremities  Post vital signs: Reviewed and stable  Complications: No apparent anesthesia complications

## 2013-09-08 NOTE — Anesthesia Preprocedure Evaluation (Addendum)
Anesthesia Evaluation  Patient identified by MRN, date of birth, ID band Patient awake    Reviewed: Allergy & Precautions, H&P , NPO status , Patient's Chart, lab work & pertinent test results  History of Anesthesia Complications (+) PROLONGED EMERGENCE and history of anesthetic complications  Airway Mallampati: II TM Distance: >3 FB Neck ROM: Full    Dental  (+) Partial Upper, Partial Lower, Caps, Dental Advisory Given   Pulmonary sleep apnea and Continuous Positive Airway Pressure Ventilation , former smoker,          Cardiovascular hypertension, Pt. 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 Hx multiple bowel surgeries   Endo/Other  diabetes, Well Controlled, Type 2Hypothyroidism   Renal/GU      Musculoskeletal   Abdominal   Peds  Hematology  (+) anemia ,   Anesthesia Other Findings   Reproductive/Obstetrics                      Anesthesia Physical Anesthesia Plan  ASA: III  Anesthesia Plan: General   Post-op Pain Management:    Induction: Intravenous  Airway Management Planned: LMA  Additional Equipment:   Intra-op Plan:   Post-operative Plan: Extubation in OR  Informed Consent: I have reviewed the patients History and Physical, chart, labs and discussed the procedure including the risks, benefits and alternatives for the proposed anesthesia with the patient or authorized representative who has indicated his/her understanding and acceptance.   Dental advisory given  Plan Discussed with: CRNA  Anesthesia Plan Comments:        Anesthesia Quick Evaluation

## 2013-09-08 NOTE — Anesthesia Procedure Notes (Signed)
Procedure Name: LMA Insertion Date/Time: 09/08/2013 9:05 AM Performed by: Trixie Deis A Pre-anesthesia Checklist: Patient identified, Timeout performed, Suction available, Emergency Drugs available and Patient being monitored Patient Re-evaluated:Patient Re-evaluated prior to inductionOxygen Delivery Method: Circle system utilized Preoxygenation: Pre-oxygenation with 100% oxygen Intubation Type: IV induction Ventilation: Mask ventilation without difficulty and Oral airway inserted - appropriate to patient size LMA: LMA inserted LMA Size: 5.0 Number of attempts: 1 Placement Confirmation: positive ETCO2 Tube secured with: Tape Dental Injury: Teeth and Oropharynx as per pre-operative assessment

## 2013-09-08 NOTE — Op Note (Signed)
Pre-op Diagnosis - High left perirectal abscess Post-op diagnosis - same Procedure - transrectal drainage of left perirectal abscess Surgeon - Mahki Spikes K. Anesthesia - GEN - LMA Indications - this patient presents with several days of worsening pain and fullness on the left side of his rectum.  There are no visual findings on external examination, but digital rectal examination shows some fullness and fluctuance in the left posterolateral rectal wall.  CT scan confirmed a 5.8 cm abscess.  Description of procedure:  The patient is brought to the operating room and placed in a supine position on the operating room table.  After an adequate level of general anesthesia was obtained, the patient's legs were placed in lithotomy position in yellowfin stirrups. His perineum was prepped with Betadine and draped sterile fashion. A timeout was taken to ensure the proper patient and proper procedure. There are no external signs of abscess or infection. His and the sphincter was dilated up to 3 fingers and the silver bullet retractor was inserted. There is an obvious area of fluctuance in the left posterior lateral location. I chose location above the dentate line. I created a 2 cm round incision in this area and entered a large abscess cavity. This is about 1/2 cm above the internal sphincter. The purulent fluid was cultured and sent for microbiology. We evacuated all the fluctuance and purulence. We irrigated the wound thoroughly. A cauterized thoroughly for hemostasis. The wound was packed with Gelfoam gauze to aid in hemostasis. No other abscesses were noted. The retractor was removed. A dry dressing was applied. The patient was then extubated and brought to the recovery room in stable condition. All sponge, instrument, and needle counts are correct.  Imogene Burn. Georgette Dover, MD, [facility] Surgery  General/ Trauma Surgery  09/08/2013 9:53 AM

## 2013-09-08 NOTE — Anesthesia Postprocedure Evaluation (Signed)
Anesthesia Post Note  Patient: Bruce [name]  Procedure(s) Performed: Procedure(s) (LRB): IRRIGATION AND DEBRIDEMENT PERIRECTAL ABSCESS (N/A)  Anesthesia type: general  Patient location: PACU  Post pain: Pain level controlled  Post assessment: Patient's Cardiovascular Status Stable  Last Vitals:  Filed Vitals:   09/08/13 1057  BP: 120/71  Pulse: 120  Temp: 37.2 C  Resp: 17    Post vital signs: Reviewed and stable  Level of consciousness: sedated  Complications: No apparent anesthesia complications

## 2013-09-08 NOTE — Progress Notes (Signed)
Day of Surgery  Subjective: Examined patient - still having some perirectal pain  Objective: Vital signs in last 24 hours: Temp:  [97.7 F (36.5 C)-98.9 F (37.2 C)] 98.4 F (36.9 C) (07/07 0806) Pulse Rate:  [112-121] 113 (07/07 0806) Resp:  [18-21] 18 (07/07 0806) BP: (116-129)/(73-85) 125/73 mmHg (07/07 0806) SpO2:  [94 %-98 %] 95 % (07/07 0806) Weight:  [235 lb 14.3 oz (107 kg)-236 lb (107.049 kg)] 235 lb 14.3 oz (107 kg) (07/06 1700) Last BM Date: 09/08/13  Intake/Output from previous day: 07/06 0701 - 07/07 0700 In: 120 [P.O.:120] Out: 361 [Urine:360; Stool:1] Intake/Output this shift:    Rectal - no external signs of erythema or swelling; no tenderness to palpation externally; internal rectal exam deferred until OR  Lab Results:   Recent Labs  09/07/13 1850  WBC 11.7*  HGB 9.9*  HCT 31.6*  PLT 657*   BMET  Recent Labs  09/07/13 1850  NA 135*  K 4.0  CL 97  CO2 23  GLUCOSE 86  BUN 15  CREATININE 1.09  CALCIUM 8.6   PT/INR No results found for this basename: LABPROT, INR,  in the last 72 hours ABG No results found for this basename: PHART, PCO2, PO2, HCO3,  in the last 72 hours  Studies/Results: Ct Pelvis W Contrast  09/07/2013   CLINICAL DATA:  Crohn's disease.  Rectal pain and abscess.  EXAM: CT PELVIS WITH CONTRAST  TECHNIQUE: Multidetector CT imaging of the pelvis was performed using the standard protocol following the bolus administration of intravenous contrast.  CONTRAST:  19m OMNIPAQUE IOHEXOL 300 MG/ML  SOLN  COMPARISON:  08/04/2013  FINDINGS: A left perianal abscess is seen extending from the 2 to 6 o'clock positions, which 2.8 x 5.8 cm. There is adjacent inflammatory process extending posteriorly into the deep posterior space, which cross midline.  No evidence of intraperitoneal abscess or free fluid within the pelvis. No evidence of soft tissue masses or lymphadenopathy. No evidence of bowel obstruction.  IMPRESSION: Left perianal abscess  extending from 2-6 o'clock positions and measuring approximately 5.8 cm in maximum diameter. Adjacent inflammatory changes extend into the deep posterior space bilaterally.   Electronically Signed   By: JEarle GellM.D.   On: 09/07/2013 23:32    Anti-infectives: Anti-infectives   Start     Dose/Rate Route Frequency Ordered Stop   09/07/13 1830  piperacillin-tazobactam (ZOSYN) IVPB 3.375 g     3.375 g 12.5 mL/hr over 240 Minutes Intravenous 3 times per day 09/07/13 1736        Assessment/Plan: s/p Procedure(s): IRRIGATION AND DEBRIDEMENT PERIRECTAL ABSCESS (N/A) High intramural rectal abscess - left side Will perform examination under anesthesia with probable transrectal drainage of perirectal abscess.  The surgical procedure has been discussed with the patient.  Potential risks, benefits, alternative treatments, and expected outcomes have been explained.  All of the patient's questions at this time have been answered.  The likelihood of reaching the patient's treatment goal is good.  The patient understand the proposed surgical procedure and wishes to proceed.   LOS: 1 day    Kano Heckmann K. 09/08/2013

## 2013-09-09 ENCOUNTER — Encounter (HOSPITAL_COMMUNITY): Payer: Self-pay | Admitting: Surgery

## 2013-09-09 LAB — CBC
HCT: 28.3 % — ABNORMAL LOW (ref 39.0–52.0)
Hemoglobin: 8.8 g/dL — ABNORMAL LOW (ref 13.0–17.0)
MCH: 24.6 pg — ABNORMAL LOW (ref 26.0–34.0)
MCHC: 31.1 g/dL (ref 30.0–36.0)
MCV: 79.3 fL (ref 78.0–100.0)
Platelets: 595 10*3/uL — ABNORMAL HIGH (ref 150–400)
RBC: 3.57 MIL/uL — ABNORMAL LOW (ref 4.22–5.81)
RDW: 17.5 % — ABNORMAL HIGH (ref 11.5–15.5)
WBC: 11.8 10*3/uL — ABNORMAL HIGH (ref 4.0–10.5)

## 2013-09-09 MED ORDER — PANTOPRAZOLE SODIUM 40 MG PO TBEC
40.0000 mg | DELAYED_RELEASE_TABLET | Freq: Every day | ORAL | Status: DC
  Administered 2013-09-09: 40 mg via ORAL
  Filled 2013-09-09: qty 1

## 2013-09-09 MED ORDER — AMOXICILLIN-POT CLAVULANATE 875-125 MG PO TABS: 1.0000 | ORAL_TABLET | Freq: Two times a day (BID) | ORAL | Status: DC

## 2013-09-09 MED ORDER — HYDROCODONE-ACETAMINOPHEN 5-325 MG PO TABS: 1.0000 | ORAL_TABLET | ORAL | Status: DC | PRN

## 2013-09-09 MED ORDER — HYDROCODONE-ACETAMINOPHEN 5-325 MG PO TABS: 1.0000 | ORAL_TABLET | Freq: Four times a day (QID) | ORAL | Status: DC | PRN

## 2013-09-09 NOTE — Progress Notes (Signed)
[facility] Progress Note  1 Day Post-Op  Subjective: Pt doing okay.  Up ambulating.  Had a BM this am.  Pain well controlled currently.  Wants to eat regular food.  No N/V.  Tolerated clear breakfast.    Objective: Vital signs in last 24 hours: Temp:  [97.8 F (36.6 C)-99.4 F (37.4 C)] 98.4 F (36.9 C) (07/08 0512) Pulse Rate:  [96-133] 96 (07/08 0512) Resp:  [13-25] 19 (07/08 0512) BP: (109-155)/(70-90) 127/76 mmHg (07/08 0512) SpO2:  [91 %-97 %] 97 % (07/08 0512) Last BM Date: 09/08/13  Intake/Output from previous day: 07/07 0701 - 07/08 0700 In: 1135 [P.O.:460; I.V.:675] Out: 2400 [Urine:2400] Intake/Output this shift:    PE: Gen:  Alert, NAD, pleasant Rectum: Incision not visualized due to being in the rectum, noted sanguinous purulent drainage on buttock and bed pad/sheets.  Lab Results:   Recent Labs  09/07/13 1850 09/09/13 0040  WBC 11.7* 11.8*  HGB 9.9* 8.8*  HCT 31.6* 28.3*  PLT 657* 595*   BMET  Recent Labs  09/07/13 1850  NA 135*  K 4.0  CL 97  CO2 23  GLUCOSE 86  BUN 15  CREATININE 1.09  CALCIUM 8.6   PT/INR No results found for this basename: LABPROT, INR,  in the last 72 hours CMP     Component Value Date/Time   NA 135* 09/07/2013 1850   K 4.0 09/07/2013 1850   CL 97 09/07/2013 1850   CO2 23 09/07/2013 1850   GLUCOSE 86 09/07/2013 1850   BUN 15 09/07/2013 1850   CREATININE 1.09 09/07/2013 1850   CREATININE 1.32 04/14/2013 1437   CALCIUM 8.6 09/07/2013 1850   PROT 7.2 09/07/2013 1850   ALBUMIN 2.9* 09/07/2013 1850   AST 15 09/07/2013 1850   ALT 13 09/07/2013 1850   ALKPHOS 112 09/07/2013 1850   BILITOT 0.2* 09/07/2013 1850   GFRNONAA 70* 09/07/2013 1850   GFRAA 81* 09/07/2013 1850   Lipase     Component Value Date/Time   LIPASE 47 08/03/2013 2258       Studies/Results: Ct Pelvis W Contrast  09/07/2013   CLINICAL DATA:  Crohn's disease.  Rectal pain and abscess.  EXAM: CT PELVIS WITH CONTRAST  TECHNIQUE: Multidetector CT imaging of the  pelvis was performed using the standard protocol following the bolus administration of intravenous contrast.  CONTRAST:  177m OMNIPAQUE IOHEXOL 300 MG/ML  SOLN  COMPARISON:  08/04/2013  FINDINGS: A left perianal abscess is seen extending from the 2 to 6 o'clock positions, which 2.8 x 5.8 cm. There is adjacent inflammatory process extending posteriorly into the deep posterior space, which cross midline.  No evidence of intraperitoneal abscess or free fluid within the pelvis. No evidence of soft tissue masses or lymphadenopathy. No evidence of bowel obstruction.  IMPRESSION: Left perianal abscess extending from 2-6 o'clock positions and measuring approximately 5.8 cm in maximum diameter. Adjacent inflammatory changes extend into the deep posterior space bilaterally.   Electronically Signed   By: JEarle GellM.D.   On: 09/07/2013 23:32    Anti-infectives: Anti-infectives   Start     Dose/Rate Route Frequency Ordered Stop   09/07/13 1830  piperacillin-tazobactam (ZOSYN) IVPB 3.375 g     3.375 g 12.5 mL/hr over 240 Minutes Intravenous 3 times per day 09/07/13 1736         Assessment/Plan High left perianal abscess POD #1 s/p transrectal drainage of left perirectal abscess Leukocytosis - 11.8  Plan: 1.  Sitz baths, showers, dressing  over rectum to collect drainage 2.  PO pain meds, advance diet 3.  Ambulate and IS 4.  SCD's 5.  May be able to d/c today on oral abx (Augmentin)     LOS: 2 days    Coralie Keens 09/09/2013, 7:24 AM Pager: [phone]

## 2013-09-09 NOTE — Progress Notes (Signed)
Came to visit patient on behalf of [facility] to [facility] Care Management program for [facility] employees/dependents with [facility] insurance. Explained program to patient and wife Engineer, site employee). Consents signed. Will follow up with patient for post hospital discharge call and [facility] to Wellness staff will call for appointment for [employer] to Wellness program for DM, HTN management.  [facility] [phone]

## 2013-09-09 NOTE — Discharge Summary (Signed)
Imogene Burn. Georgette Dover, MD, [facility] Surgery  General/ Trauma Surgery  09/09/2013 4:34 PM

## 2013-09-09 NOTE — Discharge Summary (Signed)
[facility] Discharge Summary   Patient ID: Bruce [name] MRN: [id number] DOB/AGE: Feb 25, 1951 63 y.o.  Admit date: 09/07/2013 Discharge date: 09/09/2013  Admitting Diagnosis: High perianal abscess  Discharge Diagnosis Patient Active Problem List   Diagnosis Date Noted  . Rectal abscess 09/07/2013  . Unspecified deficiency anemia 09/03/2013  . Ingrown right big toenail 09/03/2013  . Iron deficiency anemia 09/03/2013  . B12 deficiency anemia 09/03/2013  . Type II or unspecified type diabetes mellitus without mention of complication, not stated as uncontrolled 08/17/2013  . CKD (chronic kidney disease), stage III 08/04/2013  . Unspecified constipation 05/20/2012  . HTN (hypertension) 03/17/2012  . Sleep apnea 03/17/2012  . Hypothyroidism 03/17/2012  . Crohn's ileitis 03/17/2012  . Obesity 03/17/2012  . BPH (benign prostatic hypertrophy) 03/17/2012    Consultants None  Imaging: Ct Pelvis W Contrast  09/07/2013   CLINICAL DATA:  Crohn's disease.  Rectal pain and abscess.  EXAM: CT PELVIS WITH CONTRAST  TECHNIQUE: Multidetector CT imaging of the pelvis was performed using the standard protocol following the bolus administration of intravenous contrast.  CONTRAST:  1101m OMNIPAQUE IOHEXOL 300 MG/ML  SOLN  COMPARISON:  08/04/2013  FINDINGS: A left perianal abscess is seen extending from the 2 to 6 o'clock positions, which 2.8 x 5.8 cm. There is adjacent inflammatory process extending posteriorly into the deep posterior space, which cross midline.  No evidence of intraperitoneal abscess or free fluid within the pelvis. No evidence of soft tissue masses or lymphadenopathy. No evidence of bowel obstruction.  IMPRESSION: Left perianal abscess extending from 2-6 o'clock positions and measuring approximately 5.8 cm in maximum diameter. Adjacent inflammatory changes extend into the deep posterior space bilaterally.   Electronically Signed   By: JEarle GellM.D.   On: 09/07/2013 23:32     Procedures Dr. TGeorgette Dover(09/08/13) - Transrectal drainage of left perirectal abscess   Hospital Course:  [age]year old Caucasian gentleman who was recently discharged from the hospital following ileocecal resection for Crohn's disease with obstruction. Dr. CBrantley Stageperform this operation. He seems to be recovering without any major problem. They were to eat a low appetite is not great. His bowels have been moving. There were no wound problems.  He is here today with his wife in the urgent office stating he has a three-day history of rectal pain. She's never had pain like this before although he has had a pilonidal abscess in the past. He hasn't seen any blood or. Labs. No fever or chills.  Workup showed high perianal abscess.  Patient was admitted and underwent procedure listed above.  Tolerated procedure well and was transferred to the floor.  Diet was advanced as tolerated.  On POD #1, the patient was voiding well, tolerating diet, ambulating well, pain well controlled, vital signs stable, incisions c/d/i and felt stable for discharge home.  Patient will follow up in our office in 2-3 weeks and knows to call with questions or concerns.      Medication List         acetaminophen 500 MG tablet  Commonly known as:  TYLENOL  Take 1,000 mg by mouth every 8 (eight) hours as needed for moderate pain.     amoxicillin-clavulanate 875-125 MG per tablet  Commonly known as:  AUGMENTIN  Take 1 tablet by mouth 2 (two) times daily.     aspirin 81 MG tablet  Take 81 mg by mouth daily.     AVODART 0.5 MG capsule  Generic drug:  dutasteride  Take 0.5  mg by mouth daily.     Cyanocobalamin-Salcaprozate 1000-100 MCG-MG Tabs  Commonly known as:  ELIGEN B12  Take 1 tablet by mouth daily.     FERRALET 90 90-1 MG Tabs  Take 1 tablet by mouth daily.     HYDROcodone-acetaminophen 5-325 MG per tablet  Commonly known as:  NORCO/VICODIN  Take 1-2 tablets by mouth every 6 (six) hours as needed for moderate  pain or severe pain.     levothyroxine 175 MCG tablet  Commonly known as:  SYNTHROID, LEVOTHROID  Take 1 tablet (175 mcg total) by mouth daily.     polyethylene glycol packet  Commonly known as:  MIRALAX / GLYCOLAX  Take 17 g by mouth daily.         Follow-up Information   Follow up with Maia Petties., MD On 09/24/2013. (For post-operation check.  Please arrive by 8:20am for a 8:40am appointment to check in and fill out paperwork.)    Specialty:  General Surgery   Contact information:   [address] [place] [address] [phone]       Signed: Excell Seltzer [facility] Surgery [phone]  09/09/2013, 4:28 PM

## 2013-09-09 NOTE — Progress Notes (Signed)
Discussed discharge summary with patient. Reviewed all medications with patient. Pt received Rx. Patient ready for discharge.

## 2013-09-09 NOTE — Discharge Instructions (Signed)
Peri-Rectal Abscess Your caregiver has diagnosed you as having a peri-rectal abscess. This is an infected area near the rectum that is filled with pus. If the abscess is near the surface of the skin, your caregiver may open (incise) the area and drain the pus. HOME CARE INSTRUCTIONS   If your abscess was opened up and drained. A small piece of gauze may be placed in the opening so that it can drain. Do not remove the gauze unless directed by your caregiver.  A loose dressing may be placed over the abscess site. Change the dressing as often as necessary to keep it clean and dry.  After the drain is removed, the area may be washed with a gentle antiseptic (soap) four times per day.  A warm sitz bath, warm packs or heating pad may be used for pain relief, taking care not to burn yourself.  Return for a wound check in 1 day or as directed.  An "inflatable doughnut" may be used for sitting with added comfort. These can be purchased at a drugstore or medical supply house.  To reduce pain and straining with bowel movements, eat a high fiber diet with plenty of fruits and vegetables. Use stool softeners as recommended by your caregiver. This is especially important if narcotic type pain medications were prescribed as these may cause marked constipation.  Only take over-the-counter or prescription medicines for pain, discomfort, or fever as directed by your caregiver. SEEK IMMEDIATE MEDICAL CARE IF:   You have increasing pain that is not controlled by medication.  There is increased inflammation (redness), swelling, bleeding, or drainage from the area.  An oral temperature above 102 F (38.9 C) develops.  You develop chills or generalized malaise (feel lethargic or feel "washed out").  You develop any new symptoms (problems) you feel may be related to your present problem. Document Released: 02/17/2000 Document Revised: 05/14/2011 Document Reviewed: 02/17/2008 [facility] Patient Information  2015 [place], [place]. This information is not intended to replace advice given to you by your health care provider. Make sure you discuss any questions you have with your health care provider.  Sitz Bath A sitz bath is a warm water bath taken in the sitting position that covers only the hips and buttocks. It may be used for either healing or hygiene purposes. Sitz baths are also used to relieve pain, itching, or muscle spasms. The water may contain medicine. Moist heat will help you heal and relax.  HOME CARE INSTRUCTIONS  Take 3 to 4 sitz baths a day. 1. Fill the bathtub half full with warm water. 2. Sit in the water and open the drain a little. 3. Turn on the warm water to keep the tub half full. Keep the water running constantly. 4. Soak in the water for 15 to 20 minutes. 5. After the sitz bath, pat the affected area dry first. SEEK MEDICAL CARE IF:  You get worse instead of better. Stop the sitz baths if you get worse. MAKE SURE YOU:  Understand these instructions.  Will watch your condition.  Will get help right away if you are not doing well or get worse. Document Released: 11/12/2003 Document Revised: 11/14/2011 Document Reviewed: 05/19/2010 [facility] Patient Information 2015 [place], [place]. This information is not intended to replace advice given to you by your health care provider. Make sure you discuss any questions you have with your health care provider.

## 2013-09-09 NOTE — Progress Notes (Signed)
Patient still sore but feeling better.  Wife at bedside.  Having bowel movements.  Some blood.  Not severe.  Tolerating solid food.  Wants go home.  I think that is reasonable.  Discussed use of bowel regimen and good pain control.  Due to follow up with Dr. Brantley Stage for ileoceectomy needed last month.

## 2013-09-10 ENCOUNTER — Telehealth: Payer: Self-pay

## 2013-09-10 NOTE — Telephone Encounter (Signed)
Diabetic Bundle- Called and spoke with pt wife stating that pt needs to make a follow up appointment and pt wife stated that they will call and make follow up appointment once pt finishes up with his surgery appointments

## 2013-09-11 ENCOUNTER — Ambulatory Visit (INDEPENDENT_AMBULATORY_CARE_PROVIDER_SITE_OTHER): Payer: Commercial Managed Care - PPO | Admitting: Surgery

## 2013-09-11 ENCOUNTER — Encounter (INDEPENDENT_AMBULATORY_CARE_PROVIDER_SITE_OTHER): Payer: Self-pay | Admitting: Surgery

## 2013-09-11 VITALS — BP 132/90 | HR 68 | Temp 98.9°F | Resp 15 | Ht 72.0 in | Wt 235.6 lb

## 2013-09-11 DIAGNOSIS — Z9889 Other specified postprocedural states: Secondary | ICD-10-CM

## 2013-09-11 LAB — CULTURE, ROUTINE-ABSCESS

## 2013-09-11 NOTE — Progress Notes (Signed)
Patient is here for followup one month after ileocecectomy for what was believed to be Crohn's disease causing bowel obstruction. Pathology showed smooth muscle hyperplasia of the terminal ileum without Crohn's. Seen in the office on Monday with a perianal abscess and admitted to  for drainage on Tuesday. He is following up today for his abdominal surgery. He is doing well after both procedures. He has minimal complaints.  Exam: Abdomen soft nontender. Midline scar clean dry and intact. Rectal exam shows open wound draining which is clean with no residual abscess. Drainage is minimal and serosanguineous.  Impression: #1 four-week status post ileocecectomy  #2      3 days status post incision and drainage of perianal abscess  Plan: Return in one month for one final check of both problems. No restrictions at this point a diuretic.

## 2013-09-11 NOTE — Patient Instructions (Signed)
Return 1 month

## 2013-09-15 LAB — ANAEROBIC CULTURE

## 2013-09-24 ENCOUNTER — Ambulatory Visit (INDEPENDENT_AMBULATORY_CARE_PROVIDER_SITE_OTHER): Payer: 59 | Admitting: Podiatry

## 2013-09-24 ENCOUNTER — Encounter: Payer: Self-pay | Admitting: Podiatry

## 2013-09-24 ENCOUNTER — Encounter (INDEPENDENT_AMBULATORY_CARE_PROVIDER_SITE_OTHER): Payer: Commercial Managed Care - PPO | Admitting: Surgery

## 2013-09-24 VITALS — BP 135/95 | HR 112 | Resp 15 | Ht 72.0 in | Wt 240.0 lb

## 2013-09-24 DIAGNOSIS — L600 Ingrowing nail: Secondary | ICD-10-CM

## 2013-09-24 NOTE — Progress Notes (Signed)
   Subjective:    Patient ID: Bruce [name], male    DOB: [birth year], 63 y.o.   MRN: [id number]  HPI Comments: Pt states he's been dealing with the right 1st toenail lateral border pain for 2 years, and the podiatrist across from [facility] has not helped.     Review of Systems  Gastrointestinal:       Stomach surgery in June 2015.  All other systems reviewed and are negative.      Objective:   Physical Exam        Assessment & Plan:

## 2013-09-24 NOTE — Patient Instructions (Signed)

## 2013-09-25 ENCOUNTER — Encounter: Payer: Self-pay | Admitting: Internal Medicine

## 2013-09-26 NOTE — Progress Notes (Signed)
Subjective:     Patient ID: Bruce [name], male   DOB: June 24, 1950, 63 y.o.   MRN: [id number]  HPI patient presents with incurvated painful ingrown toenail right hallux lateral border that he cannot take care of and he needs to be fixed   Review of Systems  All other systems reviewed and are negative.      Objective:   Physical Exam  Nursing note and vitals reviewed. Constitutional: He is oriented to person, place, and time.  Cardiovascular: Intact distal pulses.   Musculoskeletal: Normal range of motion.  Neurological: He is oriented to person, place, and time.  Skin: Skin is warm and dry.   neurovascular status intact with muscle strength adequate and range of motion of the subtalar midtarsal joint within normal limits. 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 I recommended removal of the corner and explained procedure and today I infiltrated 60 mg Xylocaine Marcaine mixture and remove the corner exposed matrix and apply chemical phenol 3 applications 30 seconds followed by alcohol lavaged and sterile dressing. Instructed on soaks

## 2013-10-02 ENCOUNTER — Encounter: Payer: Self-pay | Admitting: Internal Medicine

## 2013-10-07 ENCOUNTER — Encounter (INDEPENDENT_AMBULATORY_CARE_PROVIDER_SITE_OTHER): Payer: Self-pay | Admitting: *Deleted

## 2013-10-12 ENCOUNTER — Encounter (INDEPENDENT_AMBULATORY_CARE_PROVIDER_SITE_OTHER): Payer: Commercial Managed Care - PPO | Admitting: Surgery

## 2013-10-12 ENCOUNTER — Encounter (INDEPENDENT_AMBULATORY_CARE_PROVIDER_SITE_OTHER): Payer: Self-pay | Admitting: Surgery

## 2013-10-12 ENCOUNTER — Ambulatory Visit (INDEPENDENT_AMBULATORY_CARE_PROVIDER_SITE_OTHER): Payer: Commercial Managed Care - PPO | Admitting: Surgery

## 2013-10-12 VITALS — BP 130/86 | HR 84 | Resp 16 | Ht 72.0 in | Wt 241.6 lb

## 2013-10-12 DIAGNOSIS — Z9889 Other specified postprocedural states: Secondary | ICD-10-CM

## 2013-10-12 NOTE — Patient Instructions (Signed)
Return as needed

## 2013-10-12 NOTE — Progress Notes (Signed)
Patient is here for followup one month after ileocecectomy for what was believed to be Crohn's disease causing bowel obstruction. Pathology showed smooth muscle hyperplasia of the terminal ileum without Crohn's. Seen in the office on Monday with a perianal abscess and admitted to [facility] for drainage on Tuesday. He is following up today for his abdominal surgery. He is doing well after both procedures. He has minimal complaints.  Exam: Abdomen soft nontender. Midline scar clean dry and intact. Rectal exam healed anus   Impression: #8-weeks  status post ileocecectomy  #2     34  days status post incision and drainage of perianal abscess  Plan: RTC prn. No restrictions .

## 2013-10-14 ENCOUNTER — Ambulatory Visit (INDEPENDENT_AMBULATORY_CARE_PROVIDER_SITE_OTHER): Payer: 59 | Admitting: Internal Medicine

## 2013-10-18 ENCOUNTER — Encounter: Payer: Self-pay | Admitting: Internal Medicine

## 2013-10-19 ENCOUNTER — Telehealth: Payer: Self-pay | Admitting: Internal Medicine

## 2013-10-19 DIAGNOSIS — K50018 Crohn's disease of small intestine with other complication: Secondary | ICD-10-CM

## 2013-10-19 NOTE — Telephone Encounter (Signed)
Pt's spouse is requesting a new GI referral for [facility]. Pt currently sees Dr. [name], but would like to see someone at [facility]. Please enter new referral.

## 2013-10-20 ENCOUNTER — Ambulatory Visit (INDEPENDENT_AMBULATORY_CARE_PROVIDER_SITE_OTHER)
Admission: RE | Admit: 2013-10-20 | Discharge: 2013-10-20 | Disposition: A | Discharge status: Home / Self Care | Payer: 59 | Source: Ambulatory Visit | Attending: Internal Medicine | Admitting: Internal Medicine

## 2013-10-20 ENCOUNTER — Ambulatory Visit (INDEPENDENT_AMBULATORY_CARE_PROVIDER_SITE_OTHER): Payer: 59 | Admitting: Internal Medicine

## 2013-10-20 ENCOUNTER — Ambulatory Visit (INDEPENDENT_AMBULATORY_CARE_PROVIDER_SITE_OTHER): Payer: 59

## 2013-10-20 ENCOUNTER — Encounter: Payer: Self-pay | Admitting: Internal Medicine

## 2013-10-20 VITALS — BP 124/88 | HR 113 | Temp 98.2°F | Wt 245.0 lb

## 2013-10-20 DIAGNOSIS — R071 Chest pain on breathing: Secondary | ICD-10-CM

## 2013-10-20 DIAGNOSIS — R350 Frequency of micturition: Secondary | ICD-10-CM

## 2013-10-20 DIAGNOSIS — R0789 Other chest pain: Secondary | ICD-10-CM

## 2013-10-20 DIAGNOSIS — K50019 Crohn's disease of small intestine with unspecified complications: Secondary | ICD-10-CM

## 2013-10-20 DIAGNOSIS — R351 Nocturia: Secondary | ICD-10-CM

## 2013-10-20 DIAGNOSIS — K5000 Crohn's disease of small intestine without complications: Secondary | ICD-10-CM

## 2013-10-20 LAB — CBC WITH DIFFERENTIAL/PLATELET
Basophils Absolute: 0 10*3/uL (ref 0.0–0.1)
Basophils Relative: 0.3 % (ref 0.0–3.0)
Eosinophils Absolute: 0 10*3/uL (ref 0.0–0.7)
Eosinophils Relative: 0.5 % (ref 0.0–5.0)
HCT: 37.7 % — ABNORMAL LOW (ref 39.0–52.0)
Hemoglobin: 12.1 g/dL — ABNORMAL LOW (ref 13.0–17.0)
Lymphocytes Relative: 23.3 % (ref 12.0–46.0)
Lymphs Abs: 1.9 10*3/uL (ref 0.7–4.0)
MCHC: 31.9 g/dL (ref 30.0–36.0)
MCV: 78.4 fl (ref 78.0–100.0)
Monocytes Absolute: 1.1 10*3/uL — ABNORMAL HIGH (ref 0.1–1.0)
Monocytes Relative: 13.3 % — ABNORMAL HIGH (ref 3.0–12.0)
Neutro Abs: 5.1 10*3/uL (ref 1.4–7.7)
Neutrophils Relative %: 62.6 % (ref 43.0–77.0)
Platelets: 482 10*3/uL — ABNORMAL HIGH (ref 150.0–400.0)
RBC: 4.81 Mil/uL (ref 4.22–5.81)
RDW: 20.1 % — ABNORMAL HIGH (ref 11.5–15.5)
WBC: 8.2 10*3/uL (ref 4.0–10.5)

## 2013-10-20 LAB — URINALYSIS
Bilirubin Urine: NEGATIVE
Hgb urine dipstick: NEGATIVE
Ketones, ur: NEGATIVE
Leukocytes, UA: NEGATIVE
Nitrite: NEGATIVE
Specific Gravity, Urine: 1.02 (ref 1.000–1.030)
Urine Glucose: NEGATIVE
Urobilinogen, UA: 0.2 (ref 0.0–1.0)
pH: 6 (ref 5.0–8.0)

## 2013-10-20 MED ORDER — TRAMADOL HCL 50 MG PO TABS
50.0000 mg | ORAL_TABLET | Freq: Four times a day (QID) | ORAL | Status: DC | PRN
Start: 2013-10-20 — End: 2013-12-07

## 2013-10-20 NOTE — Progress Notes (Signed)
   Subjective:    Patient ID: Bruce [name], male    DOB: Mar 03, 1951, 63 y.o.   MRN: [id number]  HPI   He decscribes pain which is been present for at least 3 weeks @ the right inferior thoracic cage in the posterior axillary line. It has progressed since that time. It is worse with lifting with right upper extremity or twisting thorax. Direct pressure does make it worse. Tylenol has not been of benefit  There was no trigger or injury for this 3 weeks ago.  It is worse at night lasting up to 1-2 hours.  He has had fever and sweats only at night.  He also describes frequency and nocturia every 2 hours at night.      Review of Systems  Extrinsic symptoms of itchy, watery eyes, sneezing, or angioedema are denied. There is no significant cough, sputum production, wheezing,or  paroxysmal nocturnal dyspnea.  He's had loose stools in the context of Crohn's disease. He had partial bowel resection in June of this year.  He denies dysphagia, dyspepsia, anorexia, hematemesis, weight change, dysuria, pyuria, or hematuria.  He has no pain which radiates from the spine anteriorly.       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 There are operative changes of the right eye. He is unable to close eye completely and has decreased blinking on that side. There is asymmetry of smile , decreased on the right. With smiling or extension the tongue mandible moves to the left He has decreased range of motion of the cervical spine There is marked decreased hearing especially on the left He has pain over the right inferior rib cage at the posterior axillary line to palpation.  Abdomen is protuberant. There is some tenderness of the area the operative scar which is well-healed.  General appearance :adequately nourished; in no distress. Eyes: No conjunctival inflammation or scleral icterus is present. Oral exam: Dental  hygiene is good. Lips and gums are healthy appearing.There is no oropharyngeal erythema or exudate noted.  Heart:  Normal rate and regular rhythm. S1 and S2 normal without gallop, murmur, click, rub or other extra sounds   Lungs:Chest clear to auscultation; no wheezes, rhonchi,rales ,or rubs present.No increased work of breathing.  Abdomen: bowel sounds normal, soft  without masses, organomegaly or hernias noted.  No guarding or rebound. No flank tenderness to percussion. Skin:Warm & dry.  Intact without suspicious lesions or rashes ; no jaundice or tenting Lymphatic: No lymphadenopathy is noted about the head, neck, axilla              Assessment & Plan:  #1 chest pain right inferior thorax, posterior axillary line. R/O underlying pulmonary process #2 nocturia& frequency  Plan: See orders and recommendations

## 2013-10-20 NOTE — Patient Instructions (Signed)
Your next office appointment will be determined based upon review of your pending labs & x-rays. Those instructions will be transmitted to you through My Chart .

## 2013-10-20 NOTE — Progress Notes (Signed)
Pre visit review using our clinic review tool, if applicable. No additional management support is needed unless otherwise documented below in the visit note. 

## 2013-10-21 ENCOUNTER — Ambulatory Visit: Payer: 59 | Admitting: Internal Medicine

## 2013-10-21 LAB — D-DIMER, QUANTITATIVE

## 2013-10-22 LAB — D-DIMER, QUANTITATIVE: D-Dimer, Quant: 1.09 ug/mL-FEU — ABNORMAL HIGH (ref 0.00–0.48)

## 2013-10-28 ENCOUNTER — Encounter: Payer: Self-pay | Admitting: Internal Medicine

## 2013-10-29 NOTE — Telephone Encounter (Signed)
Dr hopper to see above

## 2013-10-30 ENCOUNTER — Telehealth: Payer: Self-pay

## 2013-10-30 ENCOUNTER — Encounter: Payer: Self-pay | Admitting: Internal Medicine

## 2013-10-30 ENCOUNTER — Other Ambulatory Visit (INDEPENDENT_AMBULATORY_CARE_PROVIDER_SITE_OTHER): Payer: 59

## 2013-10-30 ENCOUNTER — Ambulatory Visit (INDEPENDENT_AMBULATORY_CARE_PROVIDER_SITE_OTHER): Payer: 59 | Admitting: Internal Medicine

## 2013-10-30 ENCOUNTER — Other Ambulatory Visit: Payer: Self-pay | Admitting: Internal Medicine

## 2013-10-30 VITALS — BP 120/84 | HR 114 | Temp 97.8°F | Wt 239.5 lb

## 2013-10-30 DIAGNOSIS — R079 Chest pain, unspecified: Secondary | ICD-10-CM

## 2013-10-30 DIAGNOSIS — R519 Headache, unspecified: Secondary | ICD-10-CM

## 2013-10-30 DIAGNOSIS — R51 Headache: Secondary | ICD-10-CM

## 2013-10-30 LAB — SEDIMENTATION RATE: Sed Rate: 36 mm/hr — ABNORMAL HIGH (ref 0–22)
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
## 2013-10-30 NOTE — Telephone Encounter (Signed)
Called left message to call back 

## 2013-10-30 NOTE — Telephone Encounter (Signed)
Message copied by Jamesetta Orleans on Fri Oct 30, 2013  8:33 AM ------      Message from: Hendricks Limes      Created: Fri Oct 30, 2013  7:14 AM       If chest pain &/or calf pain present we need to repeat D dimer as prelude to possible CT angiogram of lungs or Venous Doppler. He can pick up Xarelto to take over weekend but would have to avoid ASA & NSAIDS ------

## 2013-10-30 NOTE — Progress Notes (Signed)
Pre visit review using our clinic review tool, if applicable. No additional management support is needed unless otherwise documented below in the visit note. 

## 2013-10-30 NOTE — Patient Instructions (Addendum)
Your next office appointment will be determined based upon review of your pending labs . Those instructions will be transmitted to you through My Chart. Followup as needed for your acute issue. Please report any significant change in your symptoms.  Please keep a diary of your headaches . Document  each occurrence on the calendar with notation of : #1 any prodrome ( any non headache symptom such as marked fatigue,visual changes, ,etc ) which precedes actual headache ; #2) severity on 1-10 scale; #3) any triggers ( food/ drink,enviromenntal or weather changes ,physical or emotional stress) in 8-12 hour period prior to the headache; & #4) response to any medications or other intervention. Please review "Headache" @ [facility] for additional information.

## 2013-10-30 NOTE — Telephone Encounter (Signed)
Called the patient did speak to his wife as patient was still sleeping that if he is having any chest/calf pain to call back and schedule with Dr. Linna Darner today to discuss results.   She did agree to do so.

## 2013-10-31 ENCOUNTER — Encounter: Payer: Self-pay | Admitting: Internal Medicine

## 2013-10-31 LAB — TROPONIN I: Troponin I: <0.01 ng/mL (ref <0.06)

## 2013-10-31 LAB — D-DIMER, QUANTITATIVE: D-Dimer, Quant: 0.98 ug/mL-FEU — ABNORMAL HIGH (ref 0.00–0.48)

## 2013-11-02 ENCOUNTER — Other Ambulatory Visit: Payer: Self-pay | Admitting: Internal Medicine

## 2013-11-02 DIAGNOSIS — R413 Other amnesia: Secondary | ICD-10-CM

## 2013-11-02 DIAGNOSIS — R51 Headache: Principal | ICD-10-CM

## 2013-11-02 DIAGNOSIS — R519 Headache, unspecified: Secondary | ICD-10-CM

## 2013-11-03 ENCOUNTER — Telehealth: Payer: Self-pay | Admitting: Gastroenterology

## 2013-11-03 NOTE — Telephone Encounter (Signed)
Dr. Hilarie Fredrickson you are the next MD of the day .  Will you accept?

## 2013-11-03 NOTE — Telephone Encounter (Signed)
Ok for new patient slot, next available Need all previous GI records if not in the chart

## 2013-11-03 NOTE — Telephone Encounter (Signed)
I am not able to accomadate his transfer. Perhaps someone else can and.

## 2013-11-03 NOTE — Telephone Encounter (Signed)
Dr. Fuller Plan please review records from Dr. [name]. He would like to transfer care to you to keep his care in [place].  Records are in the chart.  Please advise

## 2013-11-04 NOTE — Telephone Encounter (Signed)
Left Message for Patient to call back & schedule.

## 2013-11-10 ENCOUNTER — Encounter: Payer: 59 | Attending: Internal Medicine

## 2013-11-10 VITALS — Ht 74.0 in | Wt 247.0 lb

## 2013-11-10 DIAGNOSIS — E119 Type 2 diabetes mellitus without complications: Secondary | ICD-10-CM | POA: Insufficient documentation

## 2013-11-10 DIAGNOSIS — Z713 Dietary counseling and surveillance: Secondary | ICD-10-CM | POA: Insufficient documentation

## 2013-11-10 NOTE — Progress Notes (Signed)
  Patient was seen on 11-10-13 for the first of a series of three diabetes self-management courses at [facility].  Patient Education Plan per assessed needs and concerns is to attend four course education program for Diabetes Self Management Education.  Current HbA1c: 7.3  The following learning objectives were met by the patient during this class:  Describe diabetes  State some common risk factors for diabetes  Defines the role of glucose and insulin  Identifies type of diabetes and pathophysiology  Describe the relationship between diabetes and cardiovascular risk  State the members of the Healthcare Team  States the rationale for glucose monitoring  State when to test glucose  State their individual Target Range  State the importance of logging glucose readings  Describe how to interpret glucose readings  Identifies A1C target  Explain the correlation between A1c and eAG values  State symptoms and treatment of high blood glucose  State symptoms and treatment of low blood glucose  Explain proper technique for glucose testing  Identifies proper sharps disposal  Handouts given during class include:  Living Well with Diabetes book  Carb Counting and Meal Planning book  Meal Plan Card  Carbohydrate guide  Meal planning worksheet  Low Sodium Flavoring Tips  The diabetes portion plate  I9B to eAG Conversion Chart  Diabetes Medications  Diabetes Recommended Care Schedule  Support Group  Diabetes Success Plan  Core Class Satisfaction Survey  Follow-Up Plan:  Attend core 2

## 2013-11-13 ENCOUNTER — Encounter: Payer: Self-pay | Admitting: Internal Medicine

## 2013-11-26 DIAGNOSIS — E119 Type 2 diabetes mellitus without complications: Secondary | ICD-10-CM | POA: Diagnosis not present

## 2013-11-27 NOTE — Progress Notes (Signed)

## 2013-12-03 ENCOUNTER — Encounter: Payer: 59 | Attending: Internal Medicine

## 2013-12-03 DIAGNOSIS — E119 Type 2 diabetes mellitus without complications: Secondary | ICD-10-CM | POA: Insufficient documentation

## 2013-12-03 DIAGNOSIS — Z713 Dietary counseling and surveillance: Secondary | ICD-10-CM | POA: Insufficient documentation

## 2013-12-07 ENCOUNTER — Ambulatory Visit (INDEPENDENT_AMBULATORY_CARE_PROVIDER_SITE_OTHER): Payer: 59 | Admitting: Neurology

## 2013-12-07 ENCOUNTER — Encounter: Payer: Self-pay | Admitting: Neurology

## 2013-12-07 VITALS — BP 134/84 | HR 97 | Ht 72.0 in | Wt 246.0 lb

## 2013-12-07 DIAGNOSIS — R413 Other amnesia: Secondary | ICD-10-CM

## 2013-12-07 DIAGNOSIS — R519 Headache, unspecified: Secondary | ICD-10-CM

## 2013-12-07 DIAGNOSIS — R51 Headache: Secondary | ICD-10-CM

## 2013-12-07 LAB — CREATININE, SERUM: Creat: 1.11 mg/dL (ref 0.50–1.35)

## 2013-12-07 LAB — BUN: BUN: 14 mg/dL (ref 6–23)
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
## 2013-12-08 ENCOUNTER — Ambulatory Visit (HOSPITAL_COMMUNITY): Admission: RE | Admit: 2013-12-08 | Payer: 59 | Source: Ambulatory Visit

## 2013-12-09 ENCOUNTER — Encounter: Payer: Self-pay | Admitting: Neurology

## 2013-12-09 DIAGNOSIS — R519 Headache, unspecified: Secondary | ICD-10-CM | POA: Insufficient documentation

## 2013-12-09 DIAGNOSIS — R51 Headache: Secondary | ICD-10-CM

## 2013-12-09 DIAGNOSIS — R413 Other amnesia: Secondary | ICD-10-CM | POA: Insufficient documentation

## 2013-12-15 ENCOUNTER — Other Ambulatory Visit: Payer: Self-pay | Admitting: Neurology

## 2013-12-15 ENCOUNTER — Ambulatory Visit (INDEPENDENT_AMBULATORY_CARE_PROVIDER_SITE_OTHER): Payer: 59 | Admitting: Neurology

## 2013-12-15 ENCOUNTER — Ambulatory Visit (HOSPITAL_COMMUNITY)
Admission: RE | Admit: 2013-12-15 | Discharge: 2013-12-15 | Disposition: A | Discharge status: Home / Self Care | Payer: 59 | Source: Ambulatory Visit | Attending: Radiology | Admitting: Radiology

## 2013-12-15 ENCOUNTER — Ambulatory Visit (HOSPITAL_COMMUNITY)
Admission: RE | Admit: 2013-12-15 | Discharge: 2013-12-15 | Disposition: A | Discharge status: Home / Self Care | Payer: 59 | Source: Ambulatory Visit | Attending: Neurology | Admitting: Neurology

## 2013-12-15 DIAGNOSIS — G8929 Other chronic pain: Secondary | ICD-10-CM

## 2013-12-15 DIAGNOSIS — R413 Other amnesia: Secondary | ICD-10-CM

## 2013-12-15 DIAGNOSIS — G44221 Chronic tension-type headache, intractable: Secondary | ICD-10-CM

## 2013-12-15 DIAGNOSIS — G319 Degenerative disease of nervous system, unspecified: Secondary | ICD-10-CM | POA: Diagnosis not present

## 2013-12-15 DIAGNOSIS — R51 Headache: Principal | ICD-10-CM

## 2013-12-15 DIAGNOSIS — G4459 Other complicated headache syndrome: Secondary | ICD-10-CM | POA: Diagnosis present

## 2013-12-15 DIAGNOSIS — R519 Headache, unspecified: Secondary | ICD-10-CM

## 2013-12-15 DIAGNOSIS — I6782 Cerebral ischemia: Secondary | ICD-10-CM | POA: Insufficient documentation

## 2013-12-15 MED ORDER — GADOBENATE DIMEGLUMINE 529 MG/ML IV SOLN
20.0000 mL | Freq: Once | INTRAVENOUS | Status: AC | PRN
  Administered 2013-12-15: 20 mL via INTRAVENOUS

## 2013-12-16 ENCOUNTER — Inpatient Hospital Stay (HOSPITAL_COMMUNITY): Admission: RE | Admit: 2013-12-16 | Payer: 59 | Source: Ambulatory Visit

## 2013-12-16 NOTE — Progress Notes (Signed)
Patient was seen on 03/05/13 for the third of a series of three diabetes self-management courses at the [facility]. The following learning objectives were met by the patient during this class:    State the amount of activity recommended for healthy living   Describe activities suitable for individual needs   Identify ways to regularly incorporate activity into daily life   Identify barriers to activity and ways to over come these barriers  Identify diabetes medications being personally used and their primary action for lowering glucose and possible side effects   Describe role of stress on blood glucose and develop strategies to address psychosocial issues   Identify diabetes complications and ways to prevent them  Explain how to manage diabetes during illness   Evaluate success in meeting personal goal   Establish 2-3 goals that they will plan to diligently work on until they return for the  35-monthfollow-up visit  Goals:   I will be active 60 minutes or more 4 times a week  I will take my diabetes medications as scheduled  I will eat less unhealthy fats by eating less "bread"  I will test my glucose at least 2 times a day, 4 days a week  I will look at patterns in my record book at least 5 days a month  To help manage stress I will  Take naps at least 5 times a week  Your patient has identified these potential barriers to change:  Motivation Stress  Your patient has identified their diabetes self-care support plan as  Family Support On-line Resources Plan:  Attend Core 4 in 4 months

## 2013-12-17 NOTE — Procedures (Signed)
ELECTROENCEPHALOGRAM REPORT  Date of Study: 12/15/2013  Patient's Name: Bruce [name] MRN: [id number] Date of Birth: [birth year]  Referring Provider: Dr. Ellouise Newer  Clinical History: This is a 63 year old man with recurrent episodes of headaches followed by transient memory loss  Medications: Synthroid, Avodart  Technical Summary: A multichannel digital EEG recording measured by the international 10-20 system with electrodes applied with paste and impedances below 5000 ohms performed in our laboratory with EKG monitoring in an awake and asleep patient.  Hyperventilation and photic stimulation were performed.  The digital EEG was referentially recorded, reformatted, and digitally filtered in a variety of bipolar and referential montages for optimal display.    Description: The patient is awake and asleep during the recording.  During maximal wakefulness, there is a symmetric, medium voltage 8.5-9 Hz posterior dominant rhythm that attenuates with eye opening.  The record is symmetric.  During drowsiness and sleep, there is an increase in theta slowing of the background.  Vertex waves and symmetric sleep spindles were seen.  Hyperventilation and photic stimulation did not elicit any abnormalities.  There were no epileptiform discharges or electrographic seizures seen.    EKG lead was unremarkable.  Impression: This awake and asleep EEG is normal.    Clinical Correlation: A normal EEG does not exclude a clinical diagnosis of epilepsy.  If further clinical questions remain, prolonged EEG may be helpful.  Clinical correlation is advised.   Ellouise Newer, M.D.

## 2013-12-18 ENCOUNTER — Other Ambulatory Visit: Payer: Self-pay

## 2013-12-18 NOTE — Progress Notes (Signed)
Unable to reach patient will try again later

## 2013-12-25 ENCOUNTER — Telehealth: Payer: Self-pay | Admitting: Neurology

## 2013-12-25 NOTE — Telephone Encounter (Signed)
Spoke with patient about rescheduling is f/u appt until after he has 24 hour eeg in Dec. Appt was rescheduled.

## 2013-12-25 NOTE — Telephone Encounter (Signed)
Pt called/returning your call at 3:20PM. C/B [phone]

## 2013-12-30 ENCOUNTER — Encounter: Payer: Self-pay | Admitting: Internal Medicine

## 2014-01-04 ENCOUNTER — Ambulatory Visit: Payer: 59 | Admitting: Neurology

## 2014-01-06 ENCOUNTER — Ambulatory Visit (INDEPENDENT_AMBULATORY_CARE_PROVIDER_SITE_OTHER): Payer: 59 | Admitting: Geriatric Medicine

## 2014-01-06 ENCOUNTER — Ambulatory Visit: Payer: 59

## 2014-01-06 DIAGNOSIS — Z23 Encounter for immunization: Secondary | ICD-10-CM

## 2014-01-18 ENCOUNTER — Encounter: Payer: Self-pay | Admitting: Internal Medicine

## 2014-01-20 ENCOUNTER — Ambulatory Visit: Payer: 59

## 2014-01-21 ENCOUNTER — Ambulatory Visit (INDEPENDENT_AMBULATORY_CARE_PROVIDER_SITE_OTHER): Payer: 59 | Admitting: Internal Medicine

## 2014-01-21 ENCOUNTER — Encounter: Payer: Self-pay | Admitting: Internal Medicine

## 2014-01-21 VITALS — BP 130/90 | HR 80 | Temp 97.2°F | Resp 16 | Ht 72.0 in | Wt 244.0 lb

## 2014-01-21 DIAGNOSIS — D519 Vitamin B12 deficiency anemia, unspecified: Secondary | ICD-10-CM

## 2014-01-21 DIAGNOSIS — Z0000 Encounter for general adult medical examination without abnormal findings: Secondary | ICD-10-CM

## 2014-01-21 DIAGNOSIS — E038 Other specified hypothyroidism: Secondary | ICD-10-CM

## 2014-01-21 DIAGNOSIS — N183 Chronic kidney disease, stage 3 unspecified: Secondary | ICD-10-CM

## 2014-01-21 DIAGNOSIS — E118 Type 2 diabetes mellitus with unspecified complications: Secondary | ICD-10-CM

## 2014-01-21 DIAGNOSIS — D509 Iron deficiency anemia, unspecified: Secondary | ICD-10-CM

## 2014-01-21 DIAGNOSIS — Z23 Encounter for immunization: Secondary | ICD-10-CM

## 2014-01-21 DIAGNOSIS — I10 Essential (primary) hypertension: Secondary | ICD-10-CM

## 2014-01-21 MED ORDER — AZILSARTAN MEDOXOMIL 40 MG PO TABS
1.0000 | ORAL_TABLET | Freq: Every day | ORAL | Status: DC
Start: 2014-01-21 — End: 2015-03-02

## 2014-01-21 NOTE — Progress Notes (Signed)
Pre visit review using our clinic review tool, if applicable. No additional management support is needed unless otherwise documented below in the visit note. 

## 2014-01-21 NOTE — Progress Notes (Signed)
Subjective:    Patient ID: Bruce [name], male    DOB: 04-10-1950, 63 y.o.   MRN: [id number]  Hypertension This is a chronic problem. The current episode started more than 1 year ago. The problem has been gradually worsening since onset. The problem is uncontrolled. Pertinent negatives include no anxiety, chest pain, malaise/fatigue, neck pain, orthopnea, palpitations, peripheral edema, PND or shortness of breath. Past treatments include nothing. Compliance problems include exercise and diet.  Hypertensive end-organ damage includes kidney disease. Identifiable causes of hypertension include chronic renal disease.      Review of Systems  Constitutional: Negative.  Negative for fever, chills, malaise/fatigue, diaphoresis, appetite change and fatigue.  HENT: Negative.   Eyes: Negative.   Respiratory: Negative.  Negative for apnea, cough, choking, chest tightness, shortness of breath and stridor.   Cardiovascular: Negative.  Negative for chest pain, palpitations, orthopnea, leg swelling and PND.  Gastrointestinal: Negative.  Negative for nausea, vomiting, abdominal pain, diarrhea, constipation and blood in stool.  Endocrine: Negative.  Negative for polydipsia, polyphagia and polyuria.  Genitourinary: Negative.  Negative for difficulty urinating.  Musculoskeletal: Negative.  Negative for neck pain.  Skin: Negative.   Allergic/Immunologic: Negative.   Neurological: Negative.   Hematological: Negative.  Negative for adenopathy. Does not bruise/bleed easily.  Psychiatric/Behavioral: Negative.        Objective:   Physical Exam  Constitutional: He is oriented to person, place, and time. He appears well-developed and well-nourished. No distress.  HENT:  Head: Normocephalic and atraumatic.  Mouth/Throat: Oropharynx is clear and moist. No oropharyngeal exudate.  Eyes: Conjunctivae are normal. Right eye exhibits no discharge. Left eye exhibits no discharge. No scleral icterus.  Neck: Normal  range of motion. Neck supple. No JVD present. No tracheal deviation present. No thyromegaly present.  Cardiovascular: Normal rate, regular rhythm, normal heart sounds and intact distal pulses.  Exam reveals no gallop and no friction rub.   No murmur heard. Pulmonary/Chest: Effort normal and breath sounds normal. No stridor. No respiratory distress. He has no wheezes. He has no rales. He exhibits no tenderness.  Abdominal: Soft. Bowel sounds are normal. He exhibits no distension and no mass. There is no tenderness. There is no rebound and no guarding. Hernia confirmed negative in the right inguinal area and confirmed negative in the left inguinal area.  Genitourinary: Rectum normal, prostate normal, testes normal and penis normal. Rectal exam shows no external hemorrhoid, no internal hemorrhoid, no fissure, no mass, no tenderness and anal tone normal. Guaiac negative stool. Prostate is not enlarged and not tender. Right testis shows no mass, no swelling and no tenderness. Right testis is descended. Left testis shows no mass, no swelling and no tenderness. Left testis is descended. Circumcised. No penile erythema or penile tenderness. No discharge found.  Musculoskeletal: Normal range of motion. He exhibits no edema or tenderness.  Lymphadenopathy:    He has no cervical adenopathy.       Right: No inguinal adenopathy present.       Left: No inguinal adenopathy present.  Neurological: He is oriented to person, place, and time.  Skin: Skin is warm and dry. No rash noted. He is not diaphoretic. No erythema. No pallor.  Psychiatric: He has a normal mood and affect. His behavior is normal. Judgment and thought content normal.  Vitals reviewed.    Lab Results  Component Value Date   WBC 8.2 10/20/2013   HGB 12.1* 10/20/2013   HCT 37.7* 10/20/2013   PLT 482.0* 10/20/2013   GLUCOSE  86 09/07/2013   CHOL 171 12/17/2012   TRIG 126 08/17/2013   HDL 34.90* 12/17/2012   LDLDIRECT 102.6 12/17/2012    LDLCALC 95 04/03/2012   ALT 13 09/07/2013   AST 15 09/07/2013   NA 135* 09/07/2013   K 4.0 09/07/2013   CL 97 09/07/2013   CREATININE 1.11 12/07/2013   BUN 14 12/07/2013   CO2 23 09/07/2013   TSH 2.400 09/07/2013   PSA 1.84 12/17/2012   INR 1.05 08/07/2013   HGBA1C 7.3* 08/17/2013       Assessment & Plan:

## 2014-01-21 NOTE — Patient Instructions (Signed)
Hypertension Hypertension, commonly called high blood pressure, is when the force of blood pumping through your arteries is too strong. Your arteries are the blood vessels that carry blood from your heart throughout your body. A blood pressure reading consists of a higher number over a lower number, such as 110/72. The higher number (systolic) is the pressure inside your arteries when your heart pumps. The lower number (diastolic) is the pressure inside your arteries when your heart relaxes. Ideally you want your blood pressure below 120/80. Hypertension forces your heart to work harder to pump blood. Your arteries may become narrow or stiff. Having hypertension puts you at risk for heart disease, stroke, and other problems.  RISK FACTORS Some risk factors for high blood pressure are controllable. Others are not.  Risk factors you cannot control include:   Race. You may be at higher risk if you are African American.  Age. Risk increases with age.  Gender. Men are at higher risk than women before age 45 years. After age 65, women are at higher risk than men. Risk factors you can control include:  Not getting enough exercise or physical activity.  Being overweight.  Getting too much fat, sugar, calories, or salt in your diet.  Drinking too much alcohol. SIGNS AND SYMPTOMS Hypertension does not usually cause signs or symptoms. Extremely high blood pressure (hypertensive crisis) may cause headache, anxiety, shortness of breath, and nosebleed. DIAGNOSIS  To check if you have hypertension, your health care provider will measure your blood pressure while you are seated, with your arm held at the level of your heart. It should be measured at least twice using the same arm. Certain conditions can cause a difference in blood pressure between your right and left arms. A blood pressure reading that is higher than normal on one occasion does not mean that you need treatment. If one blood pressure reading  is high, ask your health care provider about having it checked again. TREATMENT  Treating high blood pressure includes making lifestyle changes and possibly taking medicine. Living a healthy lifestyle can help lower high blood pressure. You may need to change some of your habits. Lifestyle changes may include:  Following the DASH diet. This diet is high in fruits, vegetables, and whole grains. It is low in salt, red meat, and added sugars.  Getting at least 2 hours of brisk physical activity every week.  Losing weight if necessary.  Not smoking.  Limiting alcoholic beverages.  Learning ways to reduce stress. If lifestyle changes are not enough to get your blood pressure under control, your health care provider may prescribe medicine. You may need to take more than one. Work closely with your health care provider to understand the risks and benefits. HOME CARE INSTRUCTIONS  Have your blood pressure rechecked as directed by your health care provider.   Take medicines only as directed by your health care provider. Follow the directions carefully. Blood pressure medicines must be taken as prescribed. The medicine does not work as well when you skip doses. Skipping doses also puts you at risk for problems.   Do not smoke.   Monitor your blood pressure at home as directed by your health care provider. SEEK MEDICAL CARE IF:   You think you are having a reaction to medicines taken.  You have recurrent headaches or feel dizzy.  You have swelling in your ankles.  You have trouble with your vision. SEEK IMMEDIATE MEDICAL CARE IF:  You develop a severe headache or confusion.    You have unusual weakness, numbness, or feel faint.  You have severe chest or abdominal pain.  You vomit repeatedly.  You have trouble breathing. MAKE SURE YOU:   Understand these instructions.  Will watch your condition.  Will get help right away if you are not doing well or get worse. Document  Released: 02/19/2005 Document Revised: 07/06/2013 Document Reviewed: 12/12/2012 ExitCare Patient Information 2015 [employer]. This information is not intended to replace advice given to you by your health care provider. Make sure you discuss any questions you have with your health care provider. Health Maintenance A healthy lifestyle and preventative care can promote health and wellness.  Maintain regular health, dental, and eye exams.  Eat a healthy diet. Foods like vegetables, fruits, whole grains, low-fat dairy products, and lean protein foods contain the nutrients you need and are low in calories. Decrease your intake of foods high in solid fats, added sugars, and salt. Get information about a proper diet from your health care provider, if necessary.  Regular physical exercise is one of the most important things you can do for your health. Most adults should get at least 150 minutes of moderate-intensity exercise (any activity that increases your heart rate and causes you to sweat) each week. In addition, most adults need muscle-strengthening exercises on 2 or more days a week.   Maintain a healthy weight. The body mass index (BMI) is a screening tool to identify possible weight problems. It provides an estimate of body fat based on height and weight. Your health care provider can find your BMI and can help you achieve or maintain a healthy weight. For males 20 years and older:  A BMI below 18.5 is considered underweight.  A BMI of 18.5 to 24.9 is normal.  A BMI of 25 to 29.9 is considered overweight.  A BMI of 30 and above is considered obese.  Maintain normal blood lipids and cholesterol by exercising and minimizing your intake of saturated fat. Eat a balanced diet with plenty of fruits and vegetables. Blood tests for lipids and cholesterol should begin at age 20 and be repeated every 5 years. If your lipid or cholesterol levels are high, you are over age 50, or you are at high risk  for heart disease, you may need your cholesterol levels checked more frequently.Ongoing high lipid and cholesterol levels should be treated with medicines if diet and exercise are not working.  If you smoke, find out from your health care provider how to quit. If you do not use tobacco, do not start.  Lung cancer screening is recommended for adults aged 55-80 years who are at high risk for developing lung cancer because of a history of smoking. A yearly low-dose CT scan of the lungs is recommended for people who have at least a 30-pack-year history of smoking and are current smokers or have quit within the past 15 years. A pack year of smoking is smoking an average of 1 pack of cigarettes a day for 1 year (for example, a 30-pack-year history of smoking could mean smoking 1 pack a day for 30 years or 2 packs a day for 15 years). Yearly screening should continue until the smoker has stopped smoking for at least 15 years. Yearly screening should be stopped for people who develop a health problem that would prevent them from having lung cancer treatment.  If you choose to drink alcohol, do not have more than 2 drinks per day. One drink is considered to be 12 oz (  360 mL) of beer, 5 oz (150 mL) of wine, or 1.5 oz (45 mL) of liquor.  Avoid the use of street drugs. Do not share needles with anyone. Ask for help if you need support or instructions about stopping the use of drugs.  High blood pressure causes heart disease and increases the risk of stroke. Blood pressure should be checked at least every 1-2 years. Ongoing high blood pressure should be treated with medicines if weight loss and exercise are not effective.  If you are 45-79 years old, ask your health care provider if you should take aspirin to prevent heart disease.  Diabetes screening involves taking a blood sample to check your fasting blood sugar level. This should be done once every 3 years after age 45 if you are at a normal weight and without  risk factors for diabetes. Testing should be considered at a younger age or be carried out more frequently if you are overweight and have at least 1 risk factor for diabetes.  Colorectal cancer can be detected and often prevented. Most routine colorectal cancer screening begins at the age of 50 and continues through age 75. However, your health care provider may recommend screening at an earlier age if you have risk factors for colon cancer. On a yearly basis, your health care provider may provide home test kits to check for hidden blood in the stool. A small camera at the end of a tube may be used to directly examine the colon (sigmoidoscopy or colonoscopy) to detect the earliest forms of colorectal cancer. Talk to your health care provider about this at age 50 when routine screening begins. A direct exam of the colon should be repeated every 5-10 years through age 75, unless early forms of precancerous polyps or small growths are found.  People who are at an increased risk for hepatitis B should be screened for this virus. You are considered at high risk for hepatitis B if:  You were born in a country where hepatitis B occurs often. Talk with your health care provider about which countries are considered high risk.  Your parents were born in a high-risk country and you have not received a shot to protect against hepatitis B (hepatitis B vaccine).  You have HIV or AIDS.  You use needles to inject street drugs.  You live with, or have sex with, someone who has hepatitis B.  You are a man who has sex with other men (MSM).  You get hemodialysis treatment.  You take certain medicines for conditions like cancer, organ transplantation, and autoimmune conditions.  Hepatitis C blood testing is recommended for all people born from 1945 through 1965 and any individual with known risk factors for hepatitis C.  Healthy men should no longer receive prostate-specific antigen (PSA) blood tests as part of  routine cancer screening. Talk to your health care provider about prostate cancer screening.  Testicular cancer screening is not recommended for adolescents or adult males who have no symptoms. Screening includes self-exam, a health care provider exam, and other screening tests. Consult with your health care provider about any symptoms you have or any concerns you have about testicular cancer.  Practice safe sex. Use condoms and avoid high-risk sexual practices to reduce the spread of sexually transmitted infections (STIs).  You should be screened for STIs, including gonorrhea and chlamydia if:  You are sexually active and are younger than 24 years.  You are older than 24 years, and your health care provider tells you   that you are at risk for this type of infection.  Your sexual activity has changed since you were last screened, and you are at an increased risk for chlamydia or gonorrhea. Ask your health care provider if you are at risk.  If you are at risk of being infected with HIV, it is recommended that you take a prescription medicine daily to prevent HIV infection. This is called pre-exposure prophylaxis (PrEP). You are considered at risk if:  You are a man who has sex with other men (MSM).  You are a heterosexual man who is sexually active with multiple partners.  You take drugs by injection.  You are sexually active with a partner who has HIV.  Talk with your health care provider about whether you are at high risk of being infected with HIV. If you choose to begin PrEP, you should first be tested for HIV. You should then be tested every 3 months for as long as you are taking PrEP.  Use sunscreen. Apply sunscreen liberally and repeatedly throughout the day. You should seek shade when your shadow is shorter than you. Protect yourself by wearing long sleeves, pants, a wide-brimmed hat, and sunglasses year round whenever you are outdoors.  Tell your health care provider of new moles  or changes in moles, especially if there is a change in shape or color. Also, tell your health care provider if a mole is larger than the size of a pencil eraser.  A one-time screening for abdominal aortic aneurysm (AAA) and surgical repair of large AAAs by ultrasound is recommended for men aged 65-75 years who are current or former smokers.  Stay current with your vaccines (immunizations). Document Released: 08/18/2007 Document Revised: 02/24/2013 Document Reviewed: 07/17/2010 ExitCare Patient Information 2015 [employer]. This information is not intended to replace advice given to you by your health care provider. Make sure you discuss any questions you have with your health care provider.  

## 2014-01-22 ENCOUNTER — Encounter: Payer: Self-pay | Admitting: *Deleted

## 2014-01-22 ENCOUNTER — Telehealth: Payer: Self-pay | Admitting: Internal Medicine

## 2014-01-22 NOTE — Telephone Encounter (Signed)
emmi emailed °

## 2014-01-25 ENCOUNTER — Other Ambulatory Visit (INDEPENDENT_AMBULATORY_CARE_PROVIDER_SITE_OTHER): Payer: 59

## 2014-01-25 ENCOUNTER — Encounter: Payer: Self-pay | Admitting: Internal Medicine

## 2014-01-25 ENCOUNTER — Ambulatory Visit (INDEPENDENT_AMBULATORY_CARE_PROVIDER_SITE_OTHER): Payer: 59 | Admitting: Internal Medicine

## 2014-01-25 VITALS — BP 108/80 | HR 80 | Ht 71.0 in | Wt 245.5 lb

## 2014-01-25 DIAGNOSIS — E118 Type 2 diabetes mellitus with unspecified complications: Secondary | ICD-10-CM

## 2014-01-25 DIAGNOSIS — D519 Vitamin B12 deficiency anemia, unspecified: Secondary | ICD-10-CM | POA: Diagnosis not present

## 2014-01-25 DIAGNOSIS — K429 Umbilical hernia without obstruction or gangrene: Secondary | ICD-10-CM

## 2014-01-25 DIAGNOSIS — D509 Iron deficiency anemia, unspecified: Secondary | ICD-10-CM

## 2014-01-25 DIAGNOSIS — Z0000 Encounter for general adult medical examination without abnormal findings: Secondary | ICD-10-CM

## 2014-01-25 DIAGNOSIS — Z800 Family history of malignant neoplasm of digestive organs: Secondary | ICD-10-CM

## 2014-01-25 DIAGNOSIS — K50012 Crohn's disease of small intestine with intestinal obstruction: Secondary | ICD-10-CM

## 2014-01-25 LAB — COMPREHENSIVE METABOLIC PANEL
ALT: 24 U/L (ref 0–53)
AST: 30 U/L (ref 0–37)
Albumin: 3.8 g/dL (ref 3.5–5.2)
Alkaline Phosphatase: 105 U/L (ref 39–117)
BUN: 16 mg/dL (ref 6–23)
CO2: 25 mEq/L (ref 19–32)
Calcium: 9.7 mg/dL (ref 8.4–10.5)
Chloride: 101 mEq/L (ref 96–112)
Creatinine, Ser: 1.2 mg/dL (ref 0.4–1.5)
GFR: 65.92 mL/min (ref >60.00)
Glucose, Bld: 89 mg/dL (ref 70–99)
Potassium: 5.1 mEq/L (ref 3.5–5.1)
Sodium: 139 mEq/L (ref 135–145)
Total Bilirubin: 0.6 mg/dL (ref 0.2–1.2)
Total Protein: 7.6 g/dL (ref 6.0–8.3)

## 2014-01-25 LAB — IBC PANEL
Iron: 67 ug/dL (ref 42–165)
Saturation Ratios: 17.9 % — ABNORMAL LOW (ref 20.0–50.0)
Transferrin: 266.7 mg/dL (ref 212.0–360.0)

## 2014-01-25 LAB — CBC WITH DIFFERENTIAL/PLATELET
Basophils Absolute: 0.1 10*3/uL (ref 0.0–0.1)
Basophils Relative: 0.8 % (ref 0.0–3.0)
Eosinophils Absolute: 0.4 10*3/uL (ref 0.0–0.7)
Eosinophils Relative: 5 % (ref 0.0–5.0)
HCT: 51.4 % (ref 39.0–52.0)
Hemoglobin: 16.7 g/dL (ref 13.0–17.0)
Lymphocytes Relative: 22.8 % (ref 12.0–46.0)
Lymphs Abs: 1.8 10*3/uL (ref 0.7–4.0)
MCHC: 32.5 g/dL (ref 30.0–36.0)
MCV: 85.4 fl (ref 78.0–100.0)
Monocytes Absolute: 0.8 10*3/uL (ref 0.1–1.0)
Monocytes Relative: 9.5 % (ref 3.0–12.0)
Neutro Abs: 5 10*3/uL (ref 1.4–7.7)
Neutrophils Relative %: 61.9 % (ref 43.0–77.0)
Platelets: 428 10*3/uL — ABNORMAL HIGH (ref 150.0–400.0)
RBC: 6.02 Mil/uL — ABNORMAL HIGH (ref 4.22–5.81)
RDW: 19.5 % — ABNORMAL HIGH (ref 11.5–15.5)
WBC: 8.1 10*3/uL (ref 4.0–10.5)

## 2014-01-25 LAB — LIPID PANEL
Cholesterol: 179 mg/dL (ref 0–200)
HDL: 42.8 mg/dL (ref >39.00)
LDL Cholesterol: 104 mg/dL — ABNORMAL HIGH (ref 0–99)
NonHDL: 136.2
Total CHOL/HDL Ratio: 4
Triglycerides: 161 mg/dL — ABNORMAL HIGH (ref 0.0–149.0)
VLDL: 32.2 mg/dL (ref 0.0–40.0)

## 2014-01-25 LAB — FERRITIN: Ferritin: 65.4 ng/mL (ref 22.0–322.0)

## 2014-01-25 LAB — HEMOGLOBIN A1C: Hgb A1c MFr Bld: 6.7 % — ABNORMAL HIGH (ref 4.6–6.5)

## 2014-01-25 LAB — FOLATE: Folate: >24.5 ng/mL (ref >5.9)

## 2014-01-25 LAB — URINALYSIS, ROUTINE W REFLEX MICROSCOPIC
Bilirubin Urine: NEGATIVE
Ketones, ur: NEGATIVE
Leukocytes, UA: NEGATIVE
Nitrite: NEGATIVE
Specific Gravity, Urine: 1.025 (ref 1.000–1.030)
Total Protein, Urine: 100 — AB
Urine Glucose: NEGATIVE
Urobilinogen, UA: 0.2 (ref 0.0–1.0)
WBC, UA: NONE SEEN (ref 0–?)
pH: 5.5 (ref 5.0–8.0)

## 2014-01-25 LAB — VITAMIN B12: Vitamin B-12: >1500 pg/mL — ABNORMAL HIGH (ref 211–911)

## 2014-01-25 LAB — TSH: TSH: 3 u[IU]/mL (ref 0.35–4.50)

## 2014-01-25 LAB — PSA: PSA: 3.09 ng/mL (ref 0.10–4.00)

## 2014-01-25 NOTE — Patient Instructions (Signed)
As discussed with Dr. Hilarie Fredrickson, you need to purchase an abdominal binder.  You can find them at [facility] at [address].  Dr. Vena Rua nurse will call you to schedule the capsule endoscopy.  Please follow up with Dr. Hilarie Fredrickson in 6 months.

## 2014-01-26 ENCOUNTER — Encounter: Payer: Self-pay | Admitting: Internal Medicine

## 2014-01-26 DIAGNOSIS — Z800 Family history of malignant neoplasm of digestive organs: Secondary | ICD-10-CM | POA: Insufficient documentation

## 2014-01-26 NOTE — Assessment & Plan Note (Signed)
Exam done Vaccines were reviewed and updated Labs ordered Pt ed material was given 

## 2014-01-26 NOTE — Assessment & Plan Note (Signed)
His blood sugars are well controlled 

## 2014-01-26 NOTE — Assessment & Plan Note (Signed)
Anemia has resolved and iron level is normal

## 2014-01-26 NOTE — Assessment & Plan Note (Signed)
His TSH is in the normal range He will stay on the current dose of synthroid

## 2014-01-26 NOTE — Assessment & Plan Note (Signed)
His BP is well controlled Lytes and renal function are stable 
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
## 2014-02-08 ENCOUNTER — Ambulatory Visit (INDEPENDENT_AMBULATORY_CARE_PROVIDER_SITE_OTHER): Payer: 59 | Admitting: Neurology

## 2014-02-08 DIAGNOSIS — R519 Headache, unspecified: Secondary | ICD-10-CM

## 2014-02-08 DIAGNOSIS — R413 Other amnesia: Secondary | ICD-10-CM

## 2014-02-08 DIAGNOSIS — R51 Headache: Secondary | ICD-10-CM

## 2014-02-11 NOTE — Procedures (Signed)
ELECTROENCEPHALOGRAM REPORT  Dates of Recording: 02/08/2014 to 02/09/2014  Patient's Name: Bruce [name] MRN: [id number] Date of Birth: 10/18/1950  Referring Provider: Dr. Ellouise Newer  Procedure: 24-hour ambulatory EEG  History: This is a 63 year old man with recurrent episodes of headaches followed by transient memory loss. EEG for classification  Medications: aspirin, B12, Avodart, Synthroid  Technical Summary: This is a 24-hour multichannel digital EEG recording measured by the international 10-20 system with electrodes applied with paste and impedances below 5000 ohms performed as portable with EKG monitoring.  The digital EEG was referentially recorded, reformatted, and digitally filtered in a variety of bipolar and referential montages for optimal display.    DESCRIPTION OF RECORDING: During maximal wakefulness, the background activity consisted of a symmetric 9-10 Hz posterior dominant rhythm which was reactive to eye opening.  There were no epileptiform discharges or focal slowing seen in wakefulness.  During the recording, the patient progresses through wakefulness, drowsiness, and Stage 2 sleep.  Again, there were no epileptiform discharges seen.  Events: There were no push button events. Patient did not complete diary.  There were no electrographic seizures seen.  EKG lead was unremarkable.  IMPRESSION: This 24-hour ambulatory EEG study is normal.    CLINICAL CORRELATION: A normal EEG does not exclude a clinical diagnosis of epilepsy.  Typical events were not reported. If further clinical questions remain, inpatient video EEG monitoring may be helpful.   Ellouise Newer, M.D.

## 2014-02-15 ENCOUNTER — Ambulatory Visit (INDEPENDENT_AMBULATORY_CARE_PROVIDER_SITE_OTHER): Payer: 59 | Admitting: Neurology

## 2014-02-15 ENCOUNTER — Encounter: Payer: Self-pay | Admitting: Neurology

## 2014-02-15 VITALS — BP 110/82 | HR 111 | Resp 16 | Ht 72.0 in | Wt 254.0 lb

## 2014-02-15 DIAGNOSIS — R51 Headache: Secondary | ICD-10-CM

## 2014-02-15 DIAGNOSIS — R519 Headache, unspecified: Secondary | ICD-10-CM

## 2014-02-15 DIAGNOSIS — R413 Other amnesia: Secondary | ICD-10-CM
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
## 2014-02-15 NOTE — Patient Instructions (Signed)
1. Keep a calendar of your headaches, we will review on next visit 2. Follow-up in 3 months 3. As per [place] driving laws, if you lose consciousness or awareness, one should not drive until 6 months event-free

## 2014-02-20 ENCOUNTER — Encounter: Payer: Self-pay | Admitting: Neurology

## 2014-03-08 ENCOUNTER — Other Ambulatory Visit: Payer: Self-pay | Admitting: Internal Medicine

## 2014-04-05 ENCOUNTER — Ambulatory Visit: Payer: 59

## 2014-04-26 ENCOUNTER — Encounter: Payer: Self-pay | Admitting: Internal Medicine

## 2014-04-26 ENCOUNTER — Ambulatory Visit (INDEPENDENT_AMBULATORY_CARE_PROVIDER_SITE_OTHER)
Admission: RE | Admit: 2014-04-26 | Discharge: 2014-04-26 | Disposition: A | Discharge status: Home / Self Care | Payer: 59 | Source: Ambulatory Visit | Attending: Internal Medicine | Admitting: Internal Medicine

## 2014-04-26 ENCOUNTER — Other Ambulatory Visit (INDEPENDENT_AMBULATORY_CARE_PROVIDER_SITE_OTHER): Payer: 59

## 2014-04-26 ENCOUNTER — Ambulatory Visit (INDEPENDENT_AMBULATORY_CARE_PROVIDER_SITE_OTHER): Payer: 59 | Admitting: Internal Medicine

## 2014-04-26 VITALS — BP 130/84 | HR 99 | Temp 97.9°F | Resp 16 | Ht 72.0 in | Wt 264.1 lb

## 2014-04-26 DIAGNOSIS — M5134 Other intervertebral disc degeneration, thoracic region: Secondary | ICD-10-CM

## 2014-04-26 DIAGNOSIS — E038 Other specified hypothyroidism: Secondary | ICD-10-CM

## 2014-04-26 DIAGNOSIS — E118 Type 2 diabetes mellitus with unspecified complications: Secondary | ICD-10-CM

## 2014-04-26 DIAGNOSIS — I10 Essential (primary) hypertension: Secondary | ICD-10-CM

## 2014-04-26 HISTORY — DX: Other intervertebral disc degeneration, thoracic region: M51.34

## 2014-04-26 LAB — HEMOGLOBIN A1C: Hgb A1c MFr Bld: 6.2 % (ref 4.6–6.5)

## 2014-04-26 LAB — TSH: TSH: 4.46 u[IU]/mL (ref 0.35–4.50)

## 2014-04-26 NOTE — Progress Notes (Signed)
Subjective:    Patient ID: Bruce [name], male    DOB: [birth year], 64 y.o.   MRN: [id number]  Back Pain This is a recurrent problem. The current episode started more than 1 month ago. The problem occurs intermittently. The problem has been gradually worsening since onset. The pain is present in the thoracic spine. The quality of the pain is described as aching, stabbing and shooting. The pain does not radiate. The pain is at a severity of 4/10. The pain is moderate. The pain is worse during the night. The symptoms are aggravated by position. Pertinent negatives include no abdominal pain, bladder incontinence, bowel incontinence, chest pain, dysuria, fever, headaches, leg pain, numbness, paresis, paresthesias, pelvic pain, perianal numbness, tingling, weakness or weight loss. Risk factors include lack of exercise and obesity. He has tried analgesics for the symptoms. The treatment provided moderate relief.      Review of Systems  Constitutional: Negative.  Negative for fever, chills, weight loss, diaphoresis, appetite change and fatigue.  HENT: Negative.   Eyes: Negative.   Respiratory: Negative.  Negative for cough, choking, chest tightness, shortness of breath and stridor.   Cardiovascular: Negative.  Negative for chest pain and palpitations.  Gastrointestinal: Negative.  Negative for nausea, vomiting, abdominal pain, diarrhea, constipation and bowel incontinence.  Endocrine: Negative.  Negative for polydipsia, polyphagia and polyuria.  Genitourinary: Negative.  Negative for bladder incontinence, dysuria and pelvic pain.  Musculoskeletal: Positive for back pain. Negative for arthralgias.  Skin: Negative.   Allergic/Immunologic: Negative.   Neurological: Negative.  Negative for tingling, weakness, numbness, headaches and paresthesias.  Hematological: Negative.  Negative for adenopathy. Does not bruise/bleed easily.  Psychiatric/Behavioral: Negative.        Objective:   Physical  Exam  Constitutional: He is oriented to person, place, and time. He appears well-developed and well-nourished. No distress.  HENT:  Head: Normocephalic and atraumatic.  Mouth/Throat: Oropharynx is clear and moist. No oropharyngeal exudate.  Eyes: Conjunctivae are normal. Right eye exhibits no discharge. Left eye exhibits no discharge. No scleral icterus.  Neck: Normal range of motion. Neck supple. No JVD present. No tracheal deviation present. No thyromegaly present.  Cardiovascular: Normal rate, regular rhythm, normal heart sounds and intact distal pulses.  Exam reveals no gallop and no friction rub.   No murmur heard. Pulmonary/Chest: Effort normal and breath sounds normal. No stridor. No respiratory distress. He has no wheezes. He has no rales. He exhibits no tenderness.  Abdominal: Soft. Bowel sounds are normal. He exhibits no distension and no mass. There is no tenderness. There is no rebound and no guarding.  Musculoskeletal: Normal range of motion. He exhibits no edema.       Cervical back: Normal.       Thoracic back: He exhibits tenderness and bony tenderness. He exhibits normal range of motion, no swelling, no edema, no deformity, no laceration, no pain, no spasm and normal pulse.       Lumbar back: Normal.  Lymphadenopathy:    He has no cervical adenopathy.  Neurological: He is oriented to person, place, and time. He has normal strength. He displays no atrophy, no tremor and normal reflexes. No cranial nerve deficit or sensory deficit. He exhibits normal muscle tone. He displays a negative Romberg sign. He displays no seizure activity. Coordination and gait normal.  Skin: Skin is warm and dry. No rash noted. He is not diaphoretic. No erythema. No pallor.  Psychiatric: He has a normal mood and affect. His behavior is  normal. Judgment and thought content normal.  Nursing note and vitals reviewed.    Lab Results  Component Value Date   WBC 8.1 01/25/2014   HGB 16.7 01/25/2014    HCT 51.4 01/25/2014   PLT 428.0* 01/25/2014   GLUCOSE 89 01/25/2014   CHOL 179 01/25/2014   TRIG 161.0* 01/25/2014   HDL 42.80 01/25/2014   LDLDIRECT 102.6 12/17/2012   LDLCALC 104* 01/25/2014   ALT 24 01/25/2014   AST 30 01/25/2014   NA 139 01/25/2014   K 5.1 01/25/2014   CL 101 01/25/2014   CREATININE 1.2 01/25/2014   BUN 16 01/25/2014   CO2 25 01/25/2014   TSH 3.00 01/25/2014   PSA 3.09 01/25/2014   INR 1.05 08/07/2013   HGBA1C 6.7* 01/25/2014       Assessment & Plan:

## 2014-04-26 NOTE — Progress Notes (Signed)
Pre visit review using our clinic review tool, if applicable. No additional management support is needed unless otherwise documented below in the visit note. 

## 2014-04-26 NOTE — Patient Instructions (Signed)
Back Pain, Adult Low back pain is very common. About 1 in 5 people have back pain.The cause of low back pain is rarely dangerous. The pain often gets better over time.About half of people with a sudden onset of back pain feel better in just 2 weeks. About 8 in 10 people feel better by 6 weeks.  CAUSES Some common causes of back pain include:  Strain of the muscles or ligaments supporting the spine.  Wear and tear (degeneration) of the spinal discs.  Arthritis.  Direct injury to the back. DIAGNOSIS Most of the time, the direct cause of low back pain is not known.However, back pain can be treated effectively even when the exact cause of the pain is unknown.Answering your caregiver's questions about your overall health and symptoms is one of the most accurate ways to make sure the cause of your pain is not dangerous. If your caregiver needs more information, he or she may order lab work or imaging tests (X-rays or MRIs).However, even if imaging tests show changes in your back, this usually does not require surgery. HOME CARE INSTRUCTIONS For many people, back pain returns.Since low back pain is rarely dangerous, it is often a condition that people can learn to manageon their own.   Remain active. It is stressful on the back to sit or stand in one place. Do not sit, drive, or stand in one place for more than 30 minutes at a time. Take short walks on level surfaces as soon as pain allows.Try to increase the length of time you walk each day.  Do not stay in bed.Resting more than 1 or 2 days can delay your recovery.  Do not avoid exercise or work.Your body is made to move.It is not dangerous to be active, even though your back may hurt.Your back will likely heal faster if you return to being active before your pain is gone.  Pay attention to your body when you bend and lift. Many people have less discomfortwhen lifting if they bend their knees, keep the load close to their bodies,and  avoid twisting. Often, the most comfortable positions are those that put less stress on your recovering back.  Find a comfortable position to sleep. Use a firm mattress and lie on your side with your knees slightly bent. If you lie on your back, put a pillow under your knees.  Only take over-the-counter or prescription medicines as directed by your caregiver. Over-the-counter medicines to reduce pain and inflammation are often the most helpful.Your caregiver may prescribe muscle relaxant drugs.These medicines help dull your pain so you can more quickly return to your normal activities and healthy exercise.  Put ice on the injured area.  Put ice in a plastic bag.  Place a towel between your skin and the bag.  Leave the ice on for 15-20 minutes, 03-04 times a day for the first 2 to 3 days. After that, ice and heat may be alternated to reduce pain and spasms.  Ask your caregiver about trying back exercises and gentle massage. This may be of some benefit.  Avoid feeling anxious or stressed.Stress increases muscle tension and can worsen back pain.It is important to recognize when you are anxious or stressed and learn ways to manage it.Exercise is a great option. SEEK MEDICAL CARE IF:  You have pain that is not relieved with rest or medicine.  You have pain that does not improve in 1 week.  You have new symptoms.  You are generally not feeling well. SEEK   IMMEDIATE MEDICAL CARE IF:   You have pain that radiates from your back into your legs.  You develop new bowel or bladder control problems.  You have unusual weakness or numbness in your arms or legs.  You develop nausea or vomiting.  You develop abdominal pain.  You feel faint. Document Released: 02/19/2005 Document Revised: 08/21/2011 Document Reviewed: 06/23/2013 ExitCare Patient Information 2015 [employer]. This information is not intended to replace advice given to you by your health care provider. Make sure you  discuss any questions you have with your health care provider.  

## 2014-04-27 ENCOUNTER — Encounter: Payer: Self-pay | Admitting: Internal Medicine

## 2014-04-27 LAB — BASIC METABOLIC PANEL
BUN: 14 mg/dL (ref 6–23)
CO2: 24 mEq/L (ref 19–32)
Calcium: 9.2 mg/dL (ref 8.4–10.5)
Chloride: 105 mEq/L (ref 96–112)
Creatinine, Ser: 1.15 mg/dL (ref 0.40–1.50)
GFR: 67.85 mL/min (ref >60.00)
Glucose, Bld: 131 mg/dL — ABNORMAL HIGH (ref 70–99)
Potassium: 4.4 mEq/L (ref 3.5–5.1)
Sodium: 138 mEq/L (ref 135–145)

## 2014-04-27 NOTE — Assessment & Plan Note (Signed)
His blood sugars are adequately well controlled

## 2014-04-27 NOTE — Assessment & Plan Note (Signed)
His TSH is in the normal range Will cont on the current dose

## 2014-04-27 NOTE — Assessment & Plan Note (Signed)
His BP is well controlled Lytes and renal function are stable 

## 2014-04-27 NOTE — Assessment & Plan Note (Signed)
Plain films show DDD with spurring He does not want to take any meds Will refer to pain management for further evaluation

## 2014-05-10 ENCOUNTER — Encounter: Payer: 59 | Attending: Internal Medicine

## 2014-05-10 DIAGNOSIS — Z713 Dietary counseling and surveillance: Secondary | ICD-10-CM | POA: Insufficient documentation

## 2014-05-10 DIAGNOSIS — E118 Type 2 diabetes mellitus with unspecified complications: Secondary | ICD-10-CM | POA: Insufficient documentation

## 2014-05-11 NOTE — Progress Notes (Signed)
Appt start time: 1730 end time:  1830.  Patient was seen on 05/10/2014 for a review of the series of three diabetes self-management courses at the [facility]. The following learning objectives were met by the patient during this class:  . Reviewed blood glucose monitoring and interpretation including the recommended target ranges and Hgb A1c.  . Reviewed on carb counting, importance of regularly scheduled meals/snacks, and meal planning.  . Reviewed the effects of physical activity on glucose levels and long-term glucose control.  Recommended goal of 150 minutes of physical activity/week. . Reviewed patient medications and discussed role of medication on blood glucose and possible side effects. . Discussed strategies to manage stress, psychosocial issues, and other obstacles to diabetes management. . Encouraged moderate weight reduction to improve glucose levels.   . Reviewed short-term complications: hyper- and hypo-glycemia.  Discussed causes, symptoms, and treatment options. . Reviewed prevention, detection, and treatment of long-term complications.  Discussed the role of prolonged elevated glucose levels on body systems.  Goals:  Follow Diabetes Meal Plan as instructed  Eat 3 meals and 2 snacks, every 3-5 hrs  Limit carbohydrate intake to 45-60 grams carbohydrate/meal Limit carbohydrate intake to 15 grams carbohydrate/snack Add lean protein foods to meals/snacks  Monitor glucose levels as instructed by your doctor  Aim for goal of 15-30 mins of physical activity daily as tolerated  Bring food record and glucose log to your next nutrition visit

## 2014-05-13 ENCOUNTER — Encounter: Payer: Self-pay | Admitting: Neurology

## 2014-05-17 ENCOUNTER — Ambulatory Visit: Payer: 59 | Admitting: Neurology

## 2014-05-18 ENCOUNTER — Encounter: Payer: Self-pay | Admitting: Internal Medicine

## 2014-06-21 ENCOUNTER — Telehealth: Payer: Self-pay

## 2014-06-21 ENCOUNTER — Other Ambulatory Visit: Payer: Self-pay | Admitting: Internal Medicine

## 2014-06-21 NOTE — Telephone Encounter (Signed)
Advised that dr Ronnald Ramp has called in rx for lisinopril

## 2014-07-01 ENCOUNTER — Encounter: Payer: Self-pay | Admitting: Podiatry

## 2014-07-01 ENCOUNTER — Ambulatory Visit (INDEPENDENT_AMBULATORY_CARE_PROVIDER_SITE_OTHER): Payer: 59

## 2014-07-01 ENCOUNTER — Ambulatory Visit (INDEPENDENT_AMBULATORY_CARE_PROVIDER_SITE_OTHER): Payer: 59 | Admitting: Podiatry

## 2014-07-01 VITALS — BP 113/79 | HR 100 | Resp 12

## 2014-07-01 DIAGNOSIS — M722 Plantar fascial fibromatosis: Secondary | ICD-10-CM

## 2014-07-01 MED ORDER — TRIAMCINOLONE ACETONIDE 10 MG/ML IJ SUSP
10.0000 mg | Freq: Once | INTRAMUSCULAR | Status: AC
  Administered 2014-07-01: 10 mg

## 2014-07-01 NOTE — Progress Notes (Signed)
   Subjective:    Patient ID: Bruce [name], male    DOB: [birth year], 64 y.o.   MRN: [id number]  HPI  PT STATED RT BOTTOM OF THE HEEL IS BEEN PAINFUL FOR 2 MONTHS. THE FOOT IS GETTING WORSE ESPECIALLY FIRST THING IN THE MORNING OR WALKING IN [employer]. TRIED NO TREATMENT.  Review of Systems  HENT: Positive for hearing loss.   Gastrointestinal: Positive for diarrhea, constipation and abdominal distention.  Musculoskeletal: Positive for gait problem.       Objective:   Physical Exam        Assessment & Plan:

## 2014-07-01 NOTE — Patient Instructions (Signed)
Plantar Fasciitis  Plantar fasciitis is a common condition that causes foot pain. It is soreness (inflammation) of the band of tough fibrous tissue on the bottom of the foot that runs from the heel bone (calcaneus) to the ball of the foot. The cause of this soreness may be from excessive standing, poor fitting shoes, running on hard surfaces, being overweight, having an abnormal walk, or overuse (this is common in runners) of the painful foot or feet. It is also common in aerobic exercise dancers and ballet dancers.  SYMPTOMS   Most people with plantar fasciitis complain of:   Severe pain in the morning on the bottom of their foot especially when taking the first steps out of bed. This pain recedes after a few minutes of walking.   Severe pain is experienced also during walking following a long period of inactivity.   Pain is worse when walking barefoot or up stairs  DIAGNOSIS    Your caregiver will diagnose this condition by examining and feeling your foot.   Special tests such as X-rays of your foot, are usually not needed.  PREVENTION    Consult a sports medicine professional before beginning a new exercise program.   Walking programs offer a good workout. With walking there is a lower chance of overuse injuries common to runners. There is less impact and less jarring of the joints.   Begin all new exercise programs slowly. If problems or pain develop, decrease the amount of time or distance until you are at a comfortable level.   Wear good shoes and replace them regularly.   Stretch your foot and the heel cords at the back of the ankle (Achilles tendon) both before and after exercise.   Run or exercise on even surfaces that are not hard. For example, asphalt is better than pavement.   Do not run barefoot on hard surfaces.   If using a treadmill, vary the incline.   Do not continue to workout if you have foot or joint problems. Seek professional help if they do not improve.  HOME CARE INSTRUCTIONS     Avoid activities that cause you pain until you recover.   Use ice or cold packs on the problem or painful areas after working out.   Only take over-the-counter or prescription medicines for pain, discomfort, or fever as directed by your caregiver.   Soft shoe inserts or athletic shoes with air or gel sole cushions may be helpful.   If problems continue or become more severe, consult a sports medicine caregiver or your own health care provider. Cortisone is a potent anti-inflammatory medication that may be injected into the painful area. You can discuss this treatment with your caregiver.  MAKE SURE YOU:    Understand these instructions.   Will watch your condition.   Will get help right away if you are not doing well or get worse.  Document Released: 11/14/2000 Document Revised: 05/14/2011 Document Reviewed: 01/14/2008  ExitCare Patient Information 2015 [employer]. This information is not intended to replace advice given to you by your health care provider. Make sure you discuss any questions you have with your health care provider.

## 2014-07-02 NOTE — Progress Notes (Signed)
Subjective:     Patient ID: Bruce [name], male   DOB: Jul 12, 1950, 64 y.o.   MRN: [id number]  HPI patient states the bottom of his right heel has been hurting for several months and is making ambulation difficult. Patient has had stroke and is not a good ambulator at this time and has a lot of pain in the plantar aspect of the heel when palpated   Review of Systems  All other systems reviewed and are negative.      Objective:   Physical Exam  Constitutional: He is oriented to person, place, and time.  Cardiovascular: Intact distal pulses.   Musculoskeletal: Normal range of motion.  Neurological: He is oriented to person, place, and time.  Skin: Skin is warm.  Nursing note and vitals reviewed.  neurovascular status is intact with muscle strength adequate and range of motion within normal limits. I did note moderate reduction of extension and flexion secondary to probable stroke issue and the patient is a very slow walker but does not have any other significant issues noted. Severe discomfort medial fascial band right heel at the insertional point of the tendon the calcaneus     Assessment:     Acute plantar fasciitis right    Plan:     H&P and x-rays reviewed. Injected the plantar fascia right 3 mg Kenalog 5 mg Xylocaine and applied fascial brace with instructions on usage area reappoint for [place] to recheck in one week

## 2014-07-06 ENCOUNTER — Encounter: Payer: Self-pay | Admitting: Podiatry

## 2014-07-06 ENCOUNTER — Ambulatory Visit (INDEPENDENT_AMBULATORY_CARE_PROVIDER_SITE_OTHER): Payer: 59 | Admitting: Podiatry

## 2014-07-06 VITALS — BP 103/72 | HR 108 | Resp 16

## 2014-07-06 DIAGNOSIS — M722 Plantar fascial fibromatosis: Secondary | ICD-10-CM

## 2014-07-06 MED ORDER — TRIAMCINOLONE ACETONIDE 10 MG/ML IJ SUSP
10.0000 mg | Freq: Once | INTRAMUSCULAR | Status: AC
  Administered 2014-07-06: 10 mg

## 2014-07-06 NOTE — Progress Notes (Signed)
Subjective:     Patient ID: Bruce [name], male   DOB: 17-Apr-1950, 64 y.o.   MRN: [id number]  HPI patient states I'm improved but still have pain and I need to be active do to my physical condition   Review of Systems     Objective:   Physical Exam Neurovascular status intact muscle strength adequate with diminished discomfort plantar aspect right heel but still present with inflammation and fluid around the medial band. Also noted to have depression of arch    Assessment:     Plantar fasciitis still present right with depression of arch but improvement in pain from previous visit    Plan:     Explained chronic nature of condition and at this time scanned for custom orthotic devices. Continue using brace continue physical therapy and reinjected the plantar fascia today 3 mg Kenalog 5 mg Xylocaine and reappoint when orthotics are ready

## 2014-07-29 ENCOUNTER — Encounter: Payer: Self-pay | Admitting: Podiatry

## 2014-07-29 ENCOUNTER — Ambulatory Visit (INDEPENDENT_AMBULATORY_CARE_PROVIDER_SITE_OTHER): Payer: 59 | Admitting: Podiatry

## 2014-07-29 VITALS — BP 112/72 | HR 100 | Resp 16

## 2014-07-29 DIAGNOSIS — M722 Plantar fascial fibromatosis: Secondary | ICD-10-CM | POA: Diagnosis not present

## 2014-07-29 NOTE — Patient Instructions (Signed)
tWEARING INSTRUCTIONS FOR ORTHOTICS  Don't expect to be comfortable wearing your orthotic devices for the first time.  Like eyeglasses, you may be aware of them as time passes, they will not be uncomfortable and you will enjoy wearing them.  FOLLOW THESE INSTRUCTIONS EXACTLY!  1. Wear your orthotic devices for:       Not more than 1 hour the first day.       Not more than 2 hours the second day.       Not more than 3 hours the third day and so on.        Or wear them for as long as they feel comfortable.       If you experience discomfort in your feet or legs take them out.  When feet & legs feel       better, put them back in.  You do need to be consistent and wear them a little        everyday. 2.   If at any time the orthotic devices become acutely uncomfortable before the       time for that particular day, STOP WEARING THEM. 3.   On the next day, do not increase the wearing time. 4.   Subsequently, increase the wearing time by 15-30 minutes only if comfortable to do       so. 5.   You will be seen by your doctor about 2-4 weeks after you receive your orthotic       devices, at which time you will probably be wearing your devices comfortably        for about 8 hours or more a day. 6.   Some patients occasionally report mild aches or discomfort in other parts of the of       body such as the knees, hips or back after 3 or 4 consecutive hours of wear.  If this       is the case with you, do not extend your wearing time.  Instead, cut it back an hour or       two.  In all likelihood, these symptoms will disappear in a short period of time as your       body posture realigns itself and functions more efficiently. 7.   It is possible that your orthotic device may require some small changes or adjustment       to improve their function or make them more comfortable.   This is usually not done       before one to three months have elapsed.  These adjustments are made in        accordance  with the changed position your feet are assuming as a result of       improved biomechanical function. 8.   In women's shoes, it's not unusual for your heel to slip out of the shoe, particularly if       they are step-in-shoes.  If this is the case, try other shoes or other styles.  Try to       purchase shoes which have deeper heal seats or higher heel counters. 9.   Squeaking of orthotics devices in the shoes is due to the movement of the devices       when they are functioning normally.  To eliminate squeaking, simply dust some       baby powder into your shoes before inserting the devices.  If this does not work,  apply soap or wax to the edges of the orthotic devices or put a tissue into the shoes. 10. It is important that you follow these directions explicitly.  Failure to do so will simply       prolong the adjustment period or create problems which are easily avoided.  It makes       no difference if you are wearing your orthotic devices for only a few hours after        several months, so long as you are wearing them comfortably for those hours. 11. If you have any questions or complaints, contact our office.  We have no way of       knowing about your problems unless you tell us.  If we do not hear from you, we will       assume that you are proceeding well.

## 2014-07-31 NOTE — Progress Notes (Signed)
Subjective:     Patient ID: Bruce [name], male   DOB: May 04, 1950, 64 y.o.   MRN: [id number]  HPI patient states my right heel is doing a lot better with a minimal discomfort noted and improvement with walking. Excited for orthotics   Review of Systems     Objective:   Physical Exam Neurovascular status unchanged with discomfort plantar heel of a minimal nature only upon deep palpation with minimal clinical symptoms    Assessment:     Doing well with plantar fascial procedure right and walking with minimal discomfort    Plan:     Dispensed orthotics with instructions on usage and continued physical therapy and supportive shoe gear usage. Reappoint as needed

## 2014-09-20 ENCOUNTER — Other Ambulatory Visit: Payer: Self-pay | Admitting: Internal Medicine

## 2014-11-05 ENCOUNTER — Other Ambulatory Visit: Payer: Self-pay

## 2014-11-05 NOTE — Patient Outreach (Signed)
Wife informed RNCM that member now has Medicare and he is no longer covered by [facility]. Instructed that his case will be closed as he is not eligible for the Link to Wellness program now. Voiced understanding. Case closed as member has dis-enrolled from coverage. Peter Garter RN, [facility] Care Management Coordinator-Link to [place] Management [phone]

## 2015-03-02 ENCOUNTER — Ambulatory Visit (INDEPENDENT_AMBULATORY_CARE_PROVIDER_SITE_OTHER)
Admission: RE | Admit: 2015-03-02 | Discharge: 2015-03-02 | Disposition: A | Discharge status: Home / Self Care | Payer: 59 | Source: Ambulatory Visit | Attending: Internal Medicine | Admitting: Internal Medicine

## 2015-03-02 ENCOUNTER — Encounter: Payer: Self-pay | Admitting: Internal Medicine

## 2015-03-02 ENCOUNTER — Ambulatory Visit (INDEPENDENT_AMBULATORY_CARE_PROVIDER_SITE_OTHER): Payer: 59 | Admitting: Internal Medicine

## 2015-03-02 VITALS — BP 128/80 | HR 105 | Temp 97.9°F | Resp 16 | Ht 72.0 in | Wt 257.0 lb

## 2015-03-02 DIAGNOSIS — E118 Type 2 diabetes mellitus with unspecified complications: Secondary | ICD-10-CM | POA: Diagnosis not present

## 2015-03-02 DIAGNOSIS — J189 Pneumonia, unspecified organism: Secondary | ICD-10-CM | POA: Diagnosis not present

## 2015-03-02 DIAGNOSIS — I10 Essential (primary) hypertension: Secondary | ICD-10-CM | POA: Diagnosis not present

## 2015-03-02 DIAGNOSIS — R059 Cough, unspecified: Secondary | ICD-10-CM

## 2015-03-02 DIAGNOSIS — R05 Cough: Secondary | ICD-10-CM

## 2015-03-02 HISTORY — DX: Pneumonia, unspecified organism: J18.9

## 2015-03-02 MED ORDER — AMOXICILLIN-POT CLAVULANATE 875-125 MG PO TABS: 1.0000 | ORAL_TABLET | Freq: Two times a day (BID) | ORAL | Status: AC

## 2015-03-02 MED ORDER — AZILSARTAN MEDOXOMIL 40 MG PO TABS: 1.0000 | ORAL_TABLET | Freq: Every day | ORAL | Status: DC

## 2015-03-02 MED ORDER — HYDROCOD POLST-CPM POLST ER 10-8 MG/5ML PO SUER: 5.0000 mL | Freq: Two times a day (BID) | ORAL | Status: DC | PRN

## 2015-03-02 NOTE — Patient Instructions (Signed)

## 2015-03-02 NOTE — Progress Notes (Signed)
Subjective:  Patient ID: Bruce [name], male    DOB: February 16, 1951  Age: 64 y.o. MRN: [id number]  CC: Cough   HPI Bruce [name] presents for three-week history of worsening cough productive of green phlegm with fevers, chills, sore throat, shortness of breath and night sweats.  Outpatient Prescriptions Prior to Visit  Medication Sig Dispense Refill  . acetaminophen (TYLENOL) 500 MG tablet Take 1,000 mg by mouth every 8 (eight) hours as needed for moderate pain.    Marland Kitchen aspirin 81 MG tablet Take 81 mg by mouth daily.      Marland Kitchen levothyroxine (SYNTHROID, LEVOTHROID) 175 MCG tablet TAKE 1 TABLET BY MOUTH ONCE DAILY 90 tablet 1  . polyethylene glycol (MIRALAX / GLYCOLAX) packet Take 17 g by mouth daily.      . Azilsartan Medoxomil (EDARBI) 40 MG TABS Take 1 tablet by mouth daily. 30 tablet 11  . Cyanocobalamin-Salcaprozate (ELIGEN B12) 1000-100 MCG-MG TABS Take 1 tablet by mouth daily. 30 tablet 11  . Fe Cbn-Fe Gluc-FA-B12-C-DSS (FERRALET 90) 90-1 MG TABS Take 1 tablet by mouth daily. 30 each 11  . lisinopril (PRINIVIL,ZESTRIL) 10 MG tablet TAKE 1 TABLET BY MOUTH ONCE DAILY 90 tablet 3   No facility-administered medications prior to visit.    ROS Review of Systems  Constitutional: Positive for fever and chills. Negative for diaphoresis, activity change, appetite change, fatigue and unexpected weight change.  HENT: Positive for sore throat. Negative for congestion, facial swelling, sinus pressure, trouble swallowing and voice change.   Eyes: Negative.   Respiratory: Positive for cough and shortness of breath. Negative for apnea, choking, chest tightness, wheezing and stridor.   Cardiovascular: Negative.  Negative for chest pain, palpitations and leg swelling.  Gastrointestinal: Negative.  Negative for vomiting, abdominal pain, diarrhea, constipation and blood in stool.  Endocrine: Negative.   Genitourinary: Negative.   Musculoskeletal: Negative.  Negative for myalgias, back pain,  joint swelling and arthralgias.  Skin: Negative.  Negative for color change and rash.  Allergic/Immunologic: Negative.   Neurological: Negative.   Hematological: Negative.  Negative for adenopathy. Does not bruise/bleed easily.  Psychiatric/Behavioral: Negative.     Objective:  BP 128/80 mmHg  Pulse 105  Temp(Src) 97.9 F (36.6 C) (Oral)  Resp 16  Ht 6' (1.829 m)  Wt 257 lb (116.574 kg)  BMI 34.85 kg/m2  SpO2 95%  BP Readings from Last 3 Encounters:  03/02/15 128/80  07/29/14 112/72  07/06/14 103/72    Wt Readings from Last 3 Encounters:  03/02/15 257 lb (116.574 kg)  04/26/14 264 lb 2 oz (119.806 kg)  02/15/14 254 lb (115.214 kg)    Physical Exam  Constitutional: He is oriented to person, place, and time. He appears well-developed and well-nourished.  Non-toxic appearance. He does not have a sickly appearance. He does not appear ill. No distress.  HENT:  Head: Normocephalic and atraumatic.  Mouth/Throat: Mucous membranes are normal. Mucous membranes are not cyanotic. Posterior oropharyngeal erythema present. No oropharyngeal exudate, posterior oropharyngeal edema or tonsillar abscesses.  Eyes: Conjunctivae are normal. Right eye exhibits no discharge. Left eye exhibits no discharge. No scleral icterus.  Neck: Normal range of motion. Neck supple. No JVD present. No tracheal deviation present. No thyromegaly present.  Cardiovascular: Normal rate, regular rhythm, normal heart sounds and intact distal pulses.  Exam reveals no gallop and no friction rub.   No murmur heard. Pulmonary/Chest: Effort normal and breath sounds normal. No stridor. No respiratory distress. He has no wheezes. He has  no rales. He exhibits no tenderness.  Abdominal: Soft. Bowel sounds are normal. He exhibits no distension and no mass. There is no tenderness. There is no rebound and no guarding.  Musculoskeletal: Normal range of motion. He exhibits no edema or tenderness.  Lymphadenopathy:    He has no  cervical adenopathy.  Neurological: He is oriented to person, place, and time.  Skin: Skin is warm and dry. No rash noted. He is not diaphoretic. No erythema. No pallor.  Vitals reviewed.   Lab Results  Component Value Date   WBC 8.1 01/25/2014   HGB 16.7 01/25/2014   HCT 51.4 01/25/2014   PLT 428.0* 01/25/2014   GLUCOSE 131* 04/26/2014   CHOL 179 01/25/2014   TRIG 161.0* 01/25/2014   HDL 42.80 01/25/2014   LDLDIRECT 102.6 12/17/2012   LDLCALC 104* 01/25/2014   ALT 24 01/25/2014   AST 30 01/25/2014   NA 138 04/26/2014   K 4.4 04/26/2014   CL 105 04/26/2014   CREATININE 1.15 04/26/2014   BUN 14 04/26/2014   CO2 24 04/26/2014   TSH 4.46 04/26/2014   PSA 3.09 01/25/2014   INR 1.05 08/07/2013   HGBA1C 6.2 04/26/2014    Dg Chest 2 View  03/02/2015  CLINICAL DATA:  Productive cough and fever for 3 weeks. EXAM: CHEST  2 VIEW COMPARISON:  October 20, 2013. FINDINGS: The heart size and mediastinal contours are within normal limits. Both lungs are clear. No pneumothorax or pleural effusion is noted. The visualized skeletal structures are unremarkable. IMPRESSION: No active cardiopulmonary disease. Electronically Signed   By: Marijo Conception, M.D.   On: 03/02/2015 16:26    Assessment & Plan:   [place] was seen today for cough.  Diagnoses and all orders for this visit:  Cough- his chest x-rays normal, I'm concerned that he may be developing a chronic cough due to his ACE inhibitor therapy so Avastin to discontinue the ACE inhibitor, will treat him for the infection and will offer a cough suppressant. -     DG Chest 2 View; Future -     chlorpheniramine-HYDROcodone (TUSSIONEX PENNKINETIC ER) 10-8 MG/5ML SUER; Take 5 mLs by mouth every 12 (twelve) hours as needed for cough.  Community acquired pneumonia- will treat the infection with Augmentin and control the cough with Tussionex suspension. -     DG Chest 2 View; Future -     chlorpheniramine-HYDROcodone (TUSSIONEX PENNKINETIC ER)  10-8 MG/5ML SUER; Take 5 mLs by mouth every 12 (twelve) hours as needed for cough. -     amoxicillin-clavulanate (AUGMENTIN) 875-125 MG tablet; Take 1 tablet by mouth 2 (two) times daily.  Essential hypertension- his blood pressure is well controlled, I am concerned that he has developed a cough secondary to the ACE inhibitor therapy, I have asked him to change back to an ARB. -     Azilsartan Medoxomil (EDARBI) 40 MG TABS; Take 1 tablet by mouth daily.  Type 2 diabetes mellitus with complication, without long-term current use of insulin (HCC) -     Azilsartan Medoxomil (EDARBI) 40 MG TABS; Take 1 tablet by mouth daily.   I have discontinued Mr. Les Pou FERRALET 90, Cyanocobalamin-Salcaprozate, and lisinopril. I am also having him start on chlorpheniramine-HYDROcodone and amoxicillin-clavulanate. Additionally, I am having him maintain his aspirin, polyethylene glycol, acetaminophen, levothyroxine, and Azilsartan Medoxomil.  Meds ordered this encounter  Medications  . chlorpheniramine-HYDROcodone (TUSSIONEX PENNKINETIC ER) 10-8 MG/5ML SUER    Sig: Take 5 mLs by mouth every 12 (twelve) hours as  needed for cough.    Dispense:  140 mL    Refill:  0  . amoxicillin-clavulanate (AUGMENTIN) 875-125 MG tablet    Sig: Take 1 tablet by mouth 2 (two) times daily.    Dispense:  20 tablet    Refill:  0  . Azilsartan Medoxomil (EDARBI) 40 MG TABS    Sig: Take 1 tablet by mouth daily.    Dispense:  30 tablet    Refill:  11     Follow-up: Return in about 3 weeks (around 03/23/2015).  [name], MD

## 2015-03-02 NOTE — Progress Notes (Signed)
Pre visit review using our clinic review tool, if applicable. No additional management support is needed unless otherwise documented below in the visit note. 

## 2015-03-31 ENCOUNTER — Other Ambulatory Visit: Payer: Self-pay | Admitting: Internal Medicine

## 2015-05-14 DIAGNOSIS — H5203 Hypermetropia, bilateral: Secondary | ICD-10-CM | POA: Diagnosis not present

## 2015-05-15 ENCOUNTER — Encounter: Payer: Self-pay | Admitting: Internal Medicine

## 2015-05-16 ENCOUNTER — Other Ambulatory Visit: Payer: Self-pay | Admitting: Internal Medicine

## 2015-05-16 DIAGNOSIS — Z794 Long term (current) use of insulin: Secondary | ICD-10-CM

## 2015-05-16 DIAGNOSIS — E118 Type 2 diabetes mellitus with unspecified complications: Secondary | ICD-10-CM

## 2015-05-16 DIAGNOSIS — I10 Essential (primary) hypertension: Secondary | ICD-10-CM

## 2015-05-16 MED ORDER — TELMISARTAN 40 MG PO TABS: 40.0000 mg | ORAL_TABLET | Freq: Every day | ORAL | Status: DC

## 2015-09-29 ENCOUNTER — Other Ambulatory Visit: Payer: Self-pay | Admitting: Internal Medicine

## 2015-11-17 ENCOUNTER — Telehealth: Payer: Self-pay | Admitting: Internal Medicine

## 2015-11-17 NOTE — Telephone Encounter (Signed)
Pt would like switch to dr hunter due to our office is closer. Can I sch?

## 2015-11-17 NOTE — Telephone Encounter (Signed)
That's fine. You can schedule him with Dr. Lemmie Evens

## 2015-11-17 NOTE — Telephone Encounter (Signed)
Yes, okay with me

## 2015-11-22 NOTE — Telephone Encounter (Signed)
lmom wife cell phone

## 2015-11-24 NOTE — Telephone Encounter (Signed)
lmom for pt to callback to sch

## 2015-11-25 NOTE — Telephone Encounter (Signed)
Pt scheduled  

## 2015-12-02 ENCOUNTER — Ambulatory Visit (INDEPENDENT_AMBULATORY_CARE_PROVIDER_SITE_OTHER): Payer: Medicare Other | Admitting: Family Medicine

## 2015-12-02 ENCOUNTER — Encounter: Payer: Self-pay | Admitting: Family Medicine

## 2015-12-02 VITALS — BP 118/72 | HR 101 | Temp 97.9°F | Wt 263.2 lb

## 2015-12-02 DIAGNOSIS — I10 Essential (primary) hypertension: Secondary | ICD-10-CM | POA: Diagnosis not present

## 2015-12-02 DIAGNOSIS — B351 Tinea unguium: Secondary | ICD-10-CM

## 2015-12-02 DIAGNOSIS — K50012 Crohn's disease of small intestine with intestinal obstruction: Secondary | ICD-10-CM | POA: Diagnosis not present

## 2015-12-02 DIAGNOSIS — L570 Actinic keratosis: Secondary | ICD-10-CM

## 2015-12-02 DIAGNOSIS — Z87891 Personal history of nicotine dependence: Secondary | ICD-10-CM | POA: Insufficient documentation

## 2015-12-02 DIAGNOSIS — E038 Other specified hypothyroidism: Secondary | ICD-10-CM

## 2015-12-02 DIAGNOSIS — E118 Type 2 diabetes mellitus with unspecified complications: Secondary | ICD-10-CM

## 2015-12-02 DIAGNOSIS — D509 Iron deficiency anemia, unspecified: Secondary | ICD-10-CM

## 2015-12-02 DIAGNOSIS — Z23 Encounter for immunization: Secondary | ICD-10-CM

## 2015-12-02 MED ORDER — LEVOTHYROXINE SODIUM 175 MCG PO TABS: 175.0000 ug | ORAL_TABLET | Freq: Every day | ORAL | 3 refills | Status: DC

## 2015-12-02 NOTE — Progress Notes (Signed)
Pre visit review using our clinic review tool, if applicable. No additional management support is needed unless otherwise documented below in the visit note. 

## 2015-12-02 NOTE — Assessment & Plan Note (Signed)
S: prior office would not refill until visit and out for 2 months now. Still constipated but unchanged. Has gained weight Lab Results  Component Value Date   TSH 4.46 04/26/2014   On thyroid medication-levothyroxine 175 mcg A/P: refill meds then tsh in 6 weeks

## 2015-12-02 NOTE — Assessment & Plan Note (Signed)
Check cbc and ferritin

## 2015-12-02 NOTE — Assessment & Plan Note (Signed)
crohns- not on medicine- no GI recently- refer back. Stricture in past. Intermittent blood in stool. Also get CBC with next set of labs and ferritin with hx iron deficiency anemia.

## 2015-12-02 NOTE — Progress Notes (Signed)
Subjective:  Bruce [name] is a 65 y.o. year old very pleasant male patient who presents for/with See problem oriented charting ROS- weight gain noted. No chest pain or shortness of breath. No headache or blurry vision. .see any ROS included in HPI as well.   Past Medical History-  Patient Active Problem List   Diagnosis Date Noted  . Type II diabetes mellitus with manifestations ([place]) 08/17/2013    Priority: High  . Crohn's ileitis ([place]) 03/17/2012    Priority: High  . Iron deficiency anemia 09/03/2013    Priority: Medium  . B12 deficiency anemia 09/03/2013    Priority: Medium  . HTN (hypertension) 03/17/2012    Priority: Medium  . Sleep apnea 03/17/2012    Priority: Medium  . Hypothyroidism 03/17/2012    Priority: Medium  . BPH (benign prostatic hypertrophy) 03/17/2012    Priority: Medium  . 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 Family history of colon cancer 01/26/2014    Priority: Low  . Constipation 05/20/2012    Priority: Low  . Obesity 03/17/2012    Priority: Low  . Former smoker 12/02/2015    Medications- reviewed and updated Current Outpatient Prescriptions  Medication Sig Dispense Refill  . aspirin 81 MG tablet Take 81 mg by mouth daily.      . polyethylene glycol (MIRALAX / GLYCOLAX) packet Take 17 g by mouth daily.      Marland Kitchen telmisartan (MICARDIS) 40 MG tablet Take 1 tablet (40 mg total) by mouth daily. 30 tablet 11  . acetaminophen (TYLENOL) 500 MG tablet Take 1,000 mg by mouth every 8 (eight) hours as needed for moderate pain.    Marland Kitchen levothyroxine (SYNTHROID, LEVOTHROID) 175 MCG tablet Take 1 tablet (175 mcg total) by mouth daily. 90 tablet 3   No current facility-administered medications for this visit.     Objective: BP 118/72   Pulse (!) 101   Temp 97.9 F (36.6 C) (Oral)   Wt 263 lb 3.2 oz (119.4 kg)   SpO2 96%   BMI 35.70 kg/m   HR in 80s on my exam Gen: NAD, resting comfortably CV: RRR no murmurs rubs or  gallops Lungs: CTAB no crackles, wheeze, rhonchi Abdomen: soft/nontender/nondistended/normal bowel sounds. No rebound or guarding.  Ext: trace edema, yellow toenails but thick also on right great toe, no left great toenail Skin: warm, dry, erythema with fine scale  Neuro: grossly normal, moves all extremities  Assessment/Plan:  onychomycosis  S: right great toe- would like referral A/P: referral to podiatry placed- wants ongoing care as well  Actinic keratosis S: left cheek near nose for months A/P: cryotherapy completed with verbal aftercare instructions given to wife who is a nurse at [employer] long  HTN (hypertension) S: controlled on micardis 65m BP Readings from Last 3 Encounters:  12/02/15 118/72  03/02/15 128/80  07/29/14 112/72  A/P:Continue current meds:  Doing well  Hypothyroidism S: prior office would not refill until visit and out for 2 months now. Still constipated but unchanged. Has gained weight Lab Results  Component Value Date   TSH 4.46 04/26/2014   On thyroid medication-levothyroxine 175 mcg A/P: refill meds then tsh in 6 weeks  Type II diabetes mellitus with manifestations ([place] S: diet controlled. On no medication.  CBGs- does not check Exercise and diet- low motivation to lose- counseling attempted Lab Results  Component Value Date   HGBA1C 6.2 04/26/2014   HGBA1C 6.7 (H) 01/25/2014   HGBA1C 7.3 (  H) 08/17/2013   A/P: well controlled in past- a1c in 6 weeks with labs- hopeful remains controlled  Former smoker Quit 2005. 160 pack years (4 packs a day for 40 years). Refer lung cancer screening  Crohn's ileitis ([place]) crohns- not on medicine- no GI recently- refer back. Stricture in past. Intermittent blood in stool. Also get CBC with next set of labs and ferritin with hx iron deficiency anemia.  Iron deficiency anemia Check cbc and ferritin  3-6 months but labs in 6 weeks  Orders Placed This Encounter  Procedures  . Flu vaccine HIGH DOSE PF  .  Hemoglobin A1c    Iron Belt    Standing Status:   Future    Standing Expiration Date:   12/01/2016  . CBC    Standing Status:   Future    Standing Expiration Date:   12/01/2016  . Comprehensive metabolic panel    [place]    Standing Status:   Future    Standing Expiration Date:   12/01/2016  . Ferritin    Standing Status:   Future    Standing Expiration Date:   12/01/2016  . Ambulatory referral to Gastroenterology    Referral Priority:   Routine    Referral Type:   Consultation    Referral Reason:   Specialty Services Required    Number of Visits Requested:   1  . Ambulatory Referral for Lung Cancer Scre    Referral Priority:   Routine    Referral Type:   Consultation    Referral Reason:   Specialty Services Required    Number of Visits Requested:   1  . Ambulatory referral to Podiatry    Referral Priority:   Routine    Referral Type:   Consultation    Referral Reason:   Specialty Services Required    Requested Specialty:   Podiatry    Number of Visits Requested:   1    Meds ordered this encounter  Medications  . levothyroxine (SYNTHROID, LEVOTHROID) 175 MCG tablet    Sig: Take 1 tablet (175 mcg total) by mouth daily.    Dispense:  90 tablet    Refill:  3   The duration of face-to-face time during this visit was greater than 45 minutes. Greater than 50% of this time was spent in counseling on options for lung cancer screening, fact patient does have diabetes, BP goals, crohns follow up and referral, discussion of AKs and follow upa nd return precautions, explanation of diagnosis, planning of further management, and/or coordination of care including review of record from prior treatment.    Return precautions advised.  Garret Reddish, MD       . [phone]

## 2015-12-02 NOTE — Assessment & Plan Note (Signed)
S: controlled on micardis 76m BP Readings from Last 3 Encounters:  12/02/15 118/72  03/02/15 128/80  07/29/14 112/72  A/P:Continue current meds:  Doing well

## 2015-12-02 NOTE — Patient Instructions (Addendum)
Sign release of information at the check out desk for eye doctor diabetes report  We will call you within a week about your referral to lung cancer screening program, GI, podiatry podiatry. If you do not hear within 2 weeks, give [place] a call.   Schedule lab visit for 6 weeks  Lets check in 3-6 months

## 2015-12-02 NOTE — Assessment & Plan Note (Signed)
Quit 2005. 160 pack years (4 packs a day for 40 years). Refer lung cancer screening

## 2015-12-02 NOTE — Assessment & Plan Note (Signed)
S: diet controlled. On no medication.  CBGs- does not check Exercise and diet- low motivation to lose- counseling attempted Lab Results  Component Value Date   HGBA1C 6.2 04/26/2014   HGBA1C 6.7 (H) 01/25/2014   HGBA1C 7.3 (H) 08/17/2013   A/P: well controlled in past- a1c in 6 weeks with labs- hopeful remains controlled

## 2016-01-13 ENCOUNTER — Other Ambulatory Visit (INDEPENDENT_AMBULATORY_CARE_PROVIDER_SITE_OTHER): Payer: Medicare Other

## 2016-01-13 DIAGNOSIS — E118 Type 2 diabetes mellitus with unspecified complications: Secondary | ICD-10-CM

## 2016-01-13 DIAGNOSIS — D509 Iron deficiency anemia, unspecified: Secondary | ICD-10-CM

## 2016-01-13 LAB — CBC
HCT: 45.2 % (ref 39.0–52.0)
Hemoglobin: 14.9 g/dL (ref 13.0–17.0)
MCHC: 32.9 g/dL (ref 30.0–36.0)
MCV: 92.5 fl (ref 78.0–100.0)
Platelets: 402 10*3/uL — ABNORMAL HIGH (ref 150.0–400.0)
RBC: 4.89 Mil/uL (ref 4.22–5.81)
RDW: 15.3 % (ref 11.5–15.5)
WBC: 8 10*3/uL (ref 4.0–10.5)

## 2016-01-13 LAB — FERRITIN: Ferritin: 255.8 ng/mL (ref 22.0–322.0)

## 2016-01-13 LAB — COMPREHENSIVE METABOLIC PANEL WITH GFR
ALT: 26 U/L (ref 0–53)
AST: 32 U/L (ref 0–37)
Albumin: 3.8 g/dL (ref 3.5–5.2)
Alkaline Phosphatase: 91 U/L (ref 39–117)
BUN: 13 mg/dL (ref 6–23)
CO2: 27 meq/L (ref 19–32)
Calcium: 9.5 mg/dL (ref 8.4–10.5)
Chloride: 103 meq/L (ref 96–112)
Creatinine, Ser: 1.1 mg/dL (ref 0.40–1.50)
GFR: 71.04 mL/min
Glucose, Bld: 178 mg/dL — ABNORMAL HIGH (ref 70–99)
Potassium: 4.5 meq/L (ref 3.5–5.1)
Sodium: 139 meq/L (ref 135–145)
Total Bilirubin: 0.6 mg/dL (ref 0.2–1.2)
Total Protein: 6.8 g/dL (ref 6.0–8.3)

## 2016-01-13 LAB — HEMOGLOBIN A1C: Hgb A1c MFr Bld: 6.9 % — ABNORMAL HIGH (ref 4.6–6.5)

## 2016-01-18 ENCOUNTER — Encounter: Payer: Self-pay | Admitting: *Deleted

## 2016-02-08 IMAGING — CR DG ABD PORTABLE 1V
1 series · 1 of 1 positions shown · non-contrast
Comparison: DG ABD ACUTE W/CHEST dated 06/28/2013

CLINICAL DATA: Assess NG tube placement

EXAM:
PORTABLE ABDOMEN - 1 VIEW

[AP]
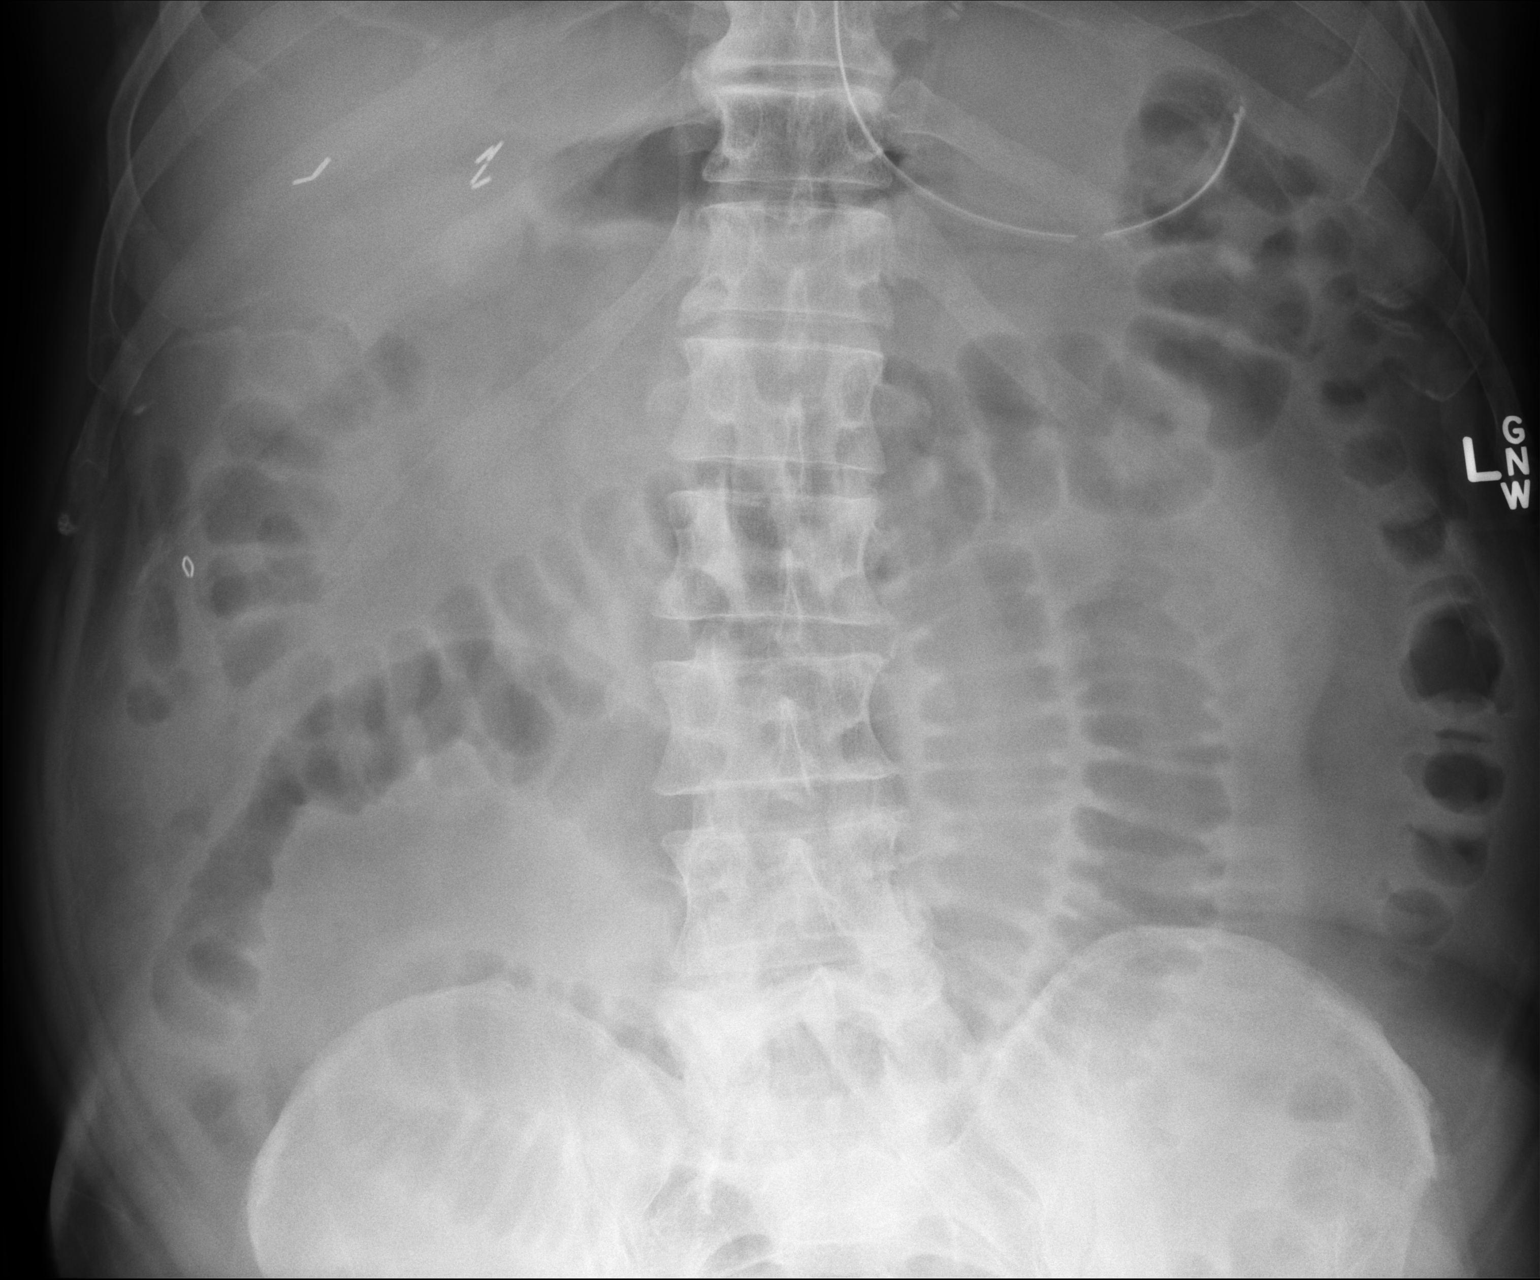

[1 of 1 positions shown; findings below may reference images not displayed]

FINDINGS: The tip and proximal port of the nasogastric tube lie below the
expected location of the GE junction. There remain loops of mildly
distended gas-filled small bowel. There are loops of normal
calibered, gas-filled large bowel present. There are surgical clips
in the gallbladder fossa. A surgical clip is noted in the right mid
abdomen.
IMPRESSION: The patient has undergone placement of a nasogastric tube with
adequate positioning radiographically.

## 2016-02-09 IMAGING — CR DG ABDOMEN 2V
2 series · 2 of 2 positions shown · non-contrast
Comparison: Abdominal radiograph 06/28/2013.

CLINICAL DATA: History of small bowel obstruction. Abdominal
distention. Weakness.

EXAM:
ABDOMEN - 2 VIEW

[w abdomen upright]
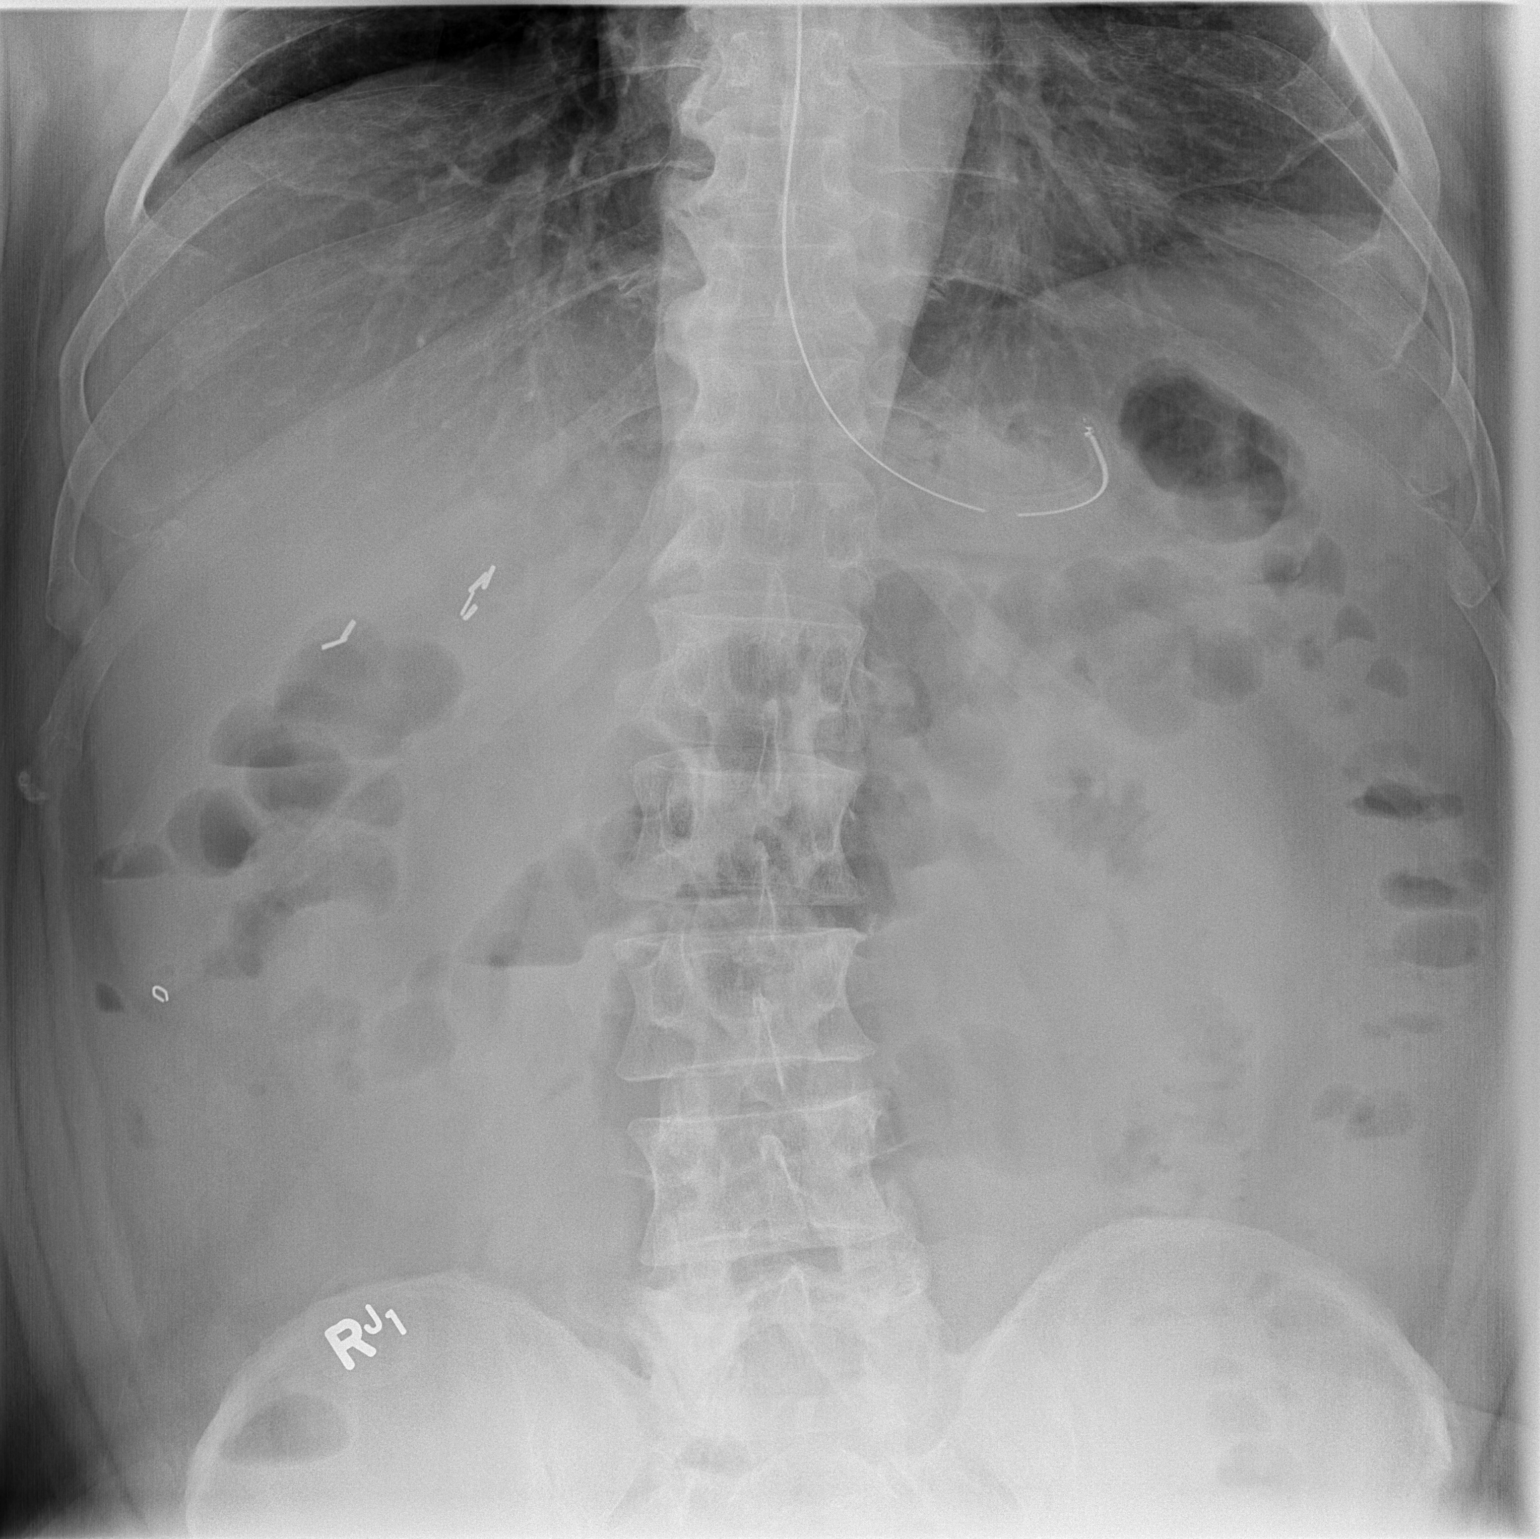

[t abdomen supine]
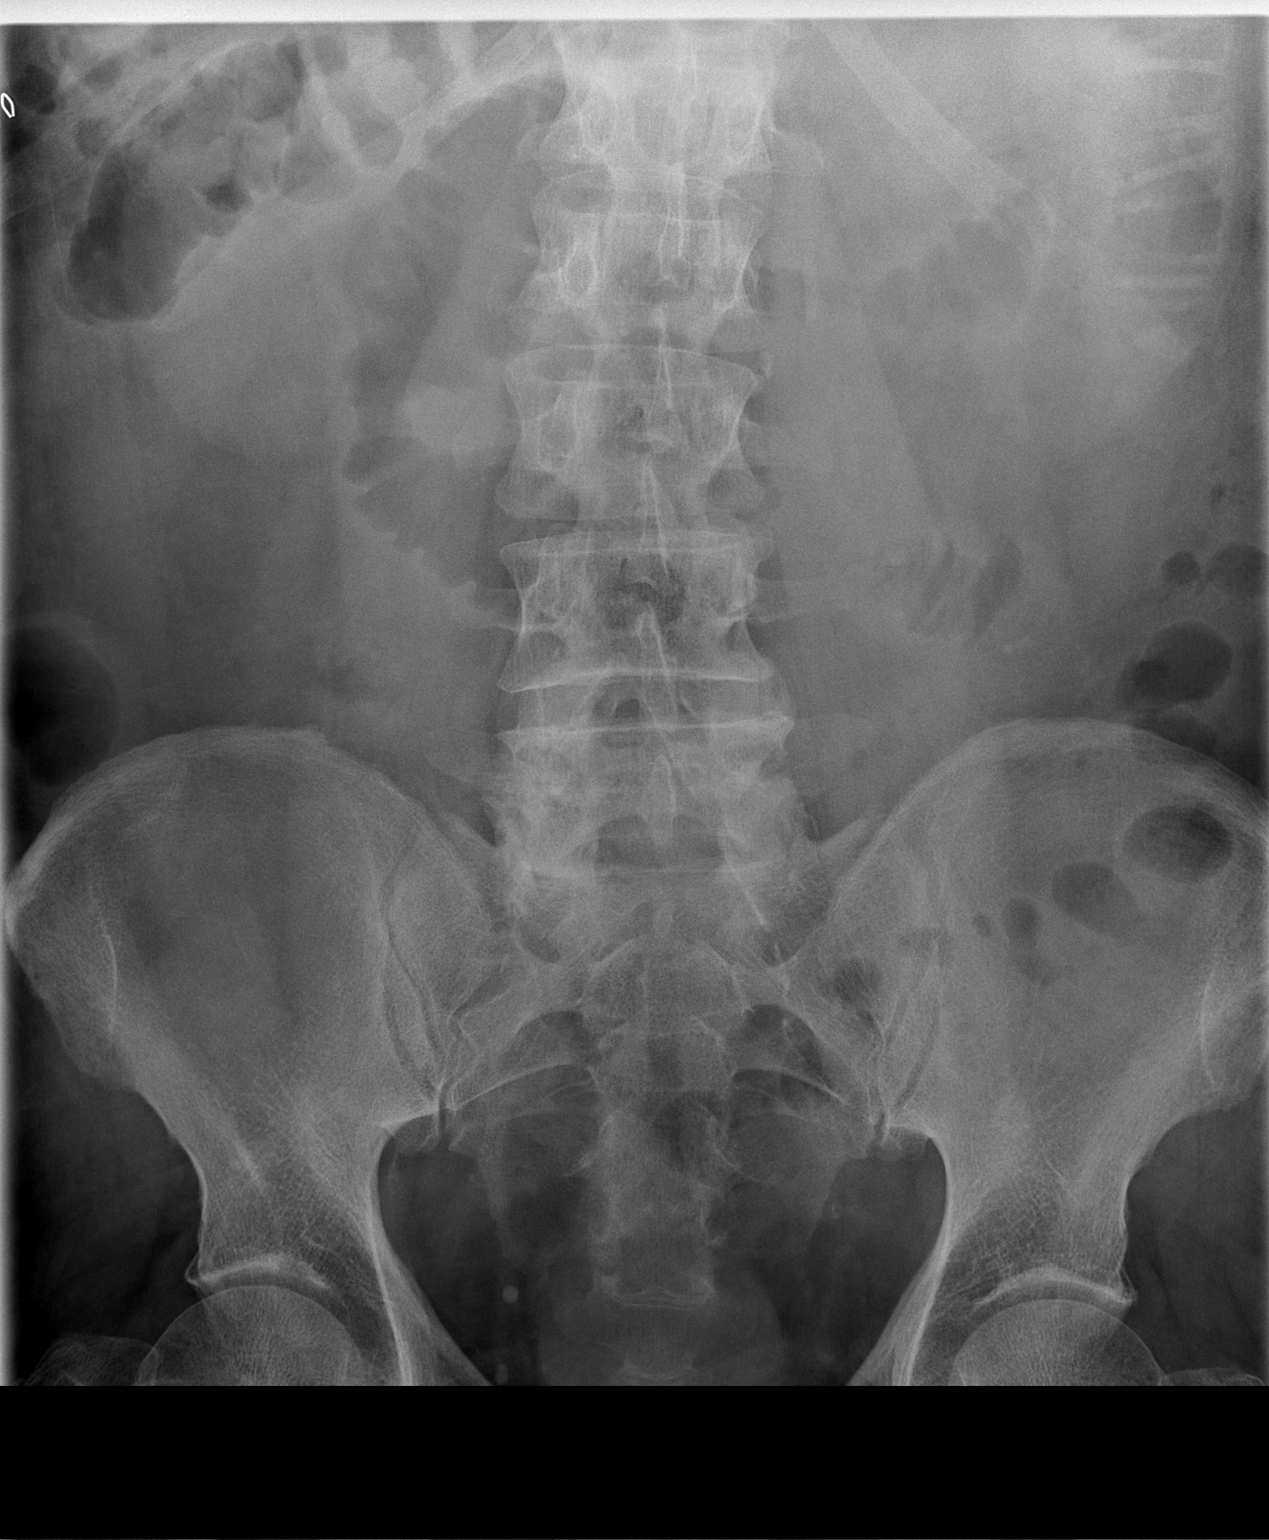

[2 of 2 positions shown; findings below may reference images not displayed]

FINDINGS: A nasogastric tube is seen in the proximal stomach, with side port
just distal to the gastroesophageal junction. Gas is noted
throughout the colon to the level of the proximal rectum. No
pathologic distention of small bowel. There are some prominent
gas-filled loops of small bowel in the central abdomen and left
upper quadrant measuring up to 2.9 cm in diameter. A few small
air-fluid levels are noted on the upright projection. No
pneumoperitoneum. Surgical clips project over the right upper
quadrant of the abdomen, compatible with prior cholecystectomy.
IMPRESSION: 1. Nonspecific, nonobstructive bowel gas pattern, as above.
2. Postoperative changes and support apparatus, as above.

## 2016-03-06 ENCOUNTER — Ambulatory Visit: Payer: Medicare Other | Admitting: Internal Medicine

## 2016-03-18 IMAGING — CR DG ABDOMEN 2V
3 series · 3 of 3 positions shown · non-contrast
Comparison: 08/04/2013 and 08/03/2013

CLINICAL DATA: Evaluate for small bowel obstruction.

EXAM:
ABDOMEN - 2 VIEW

[w abdomen upright]
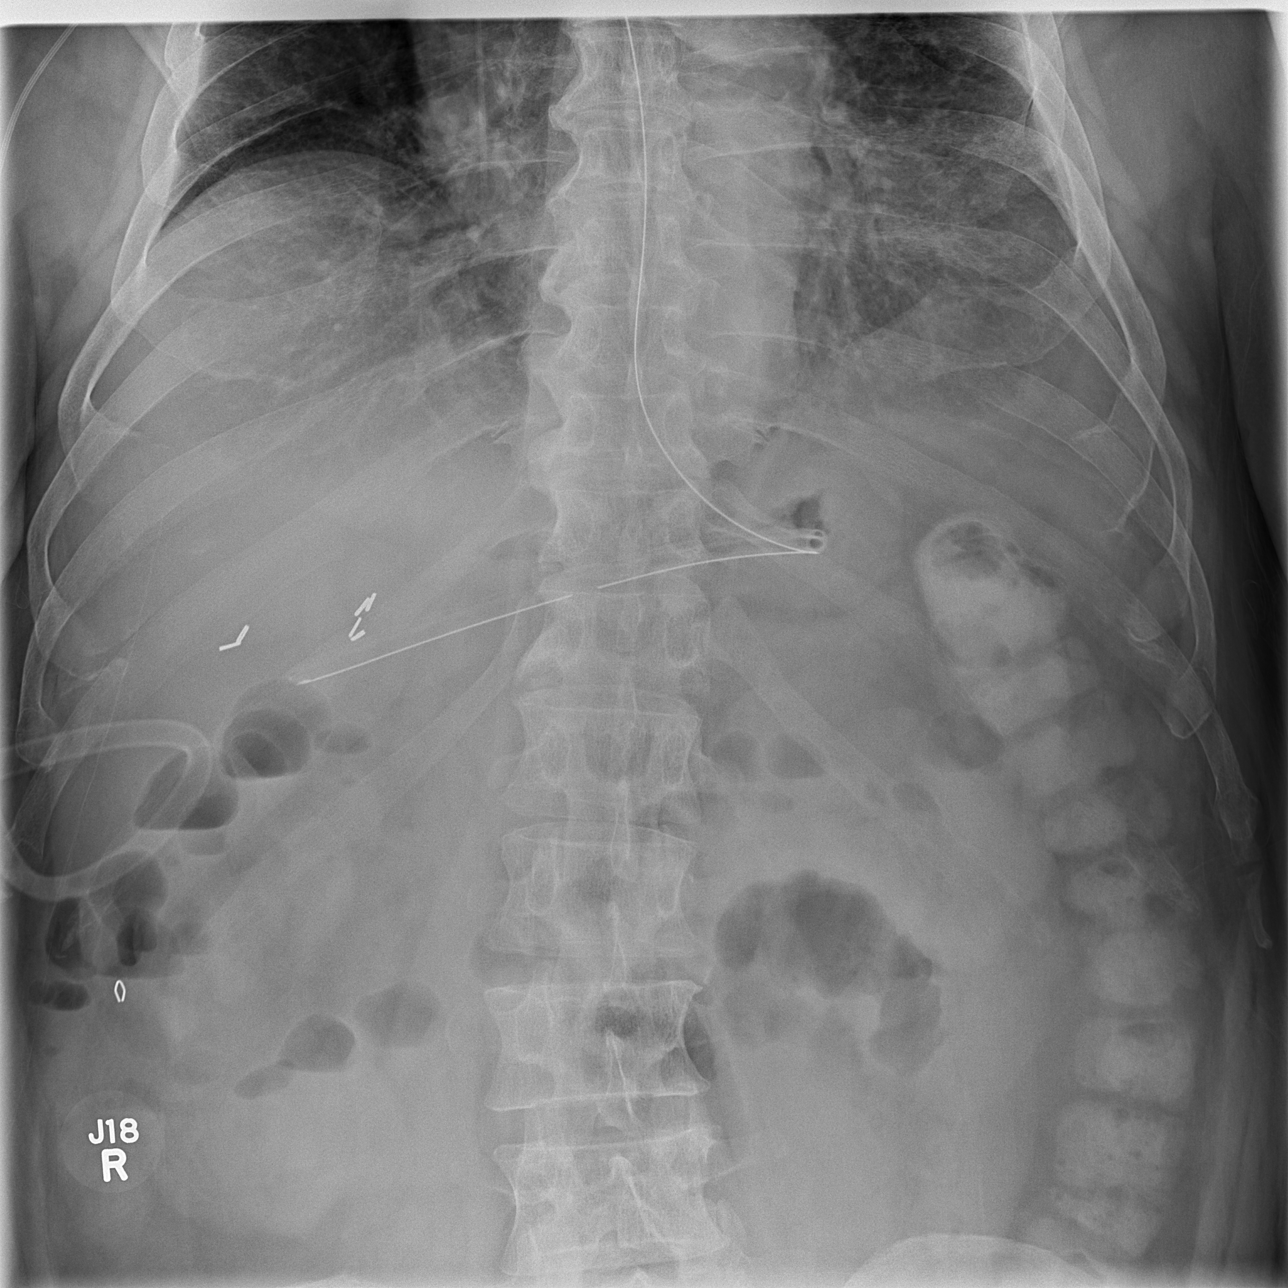

[t abdomen supine (1 of 2)]
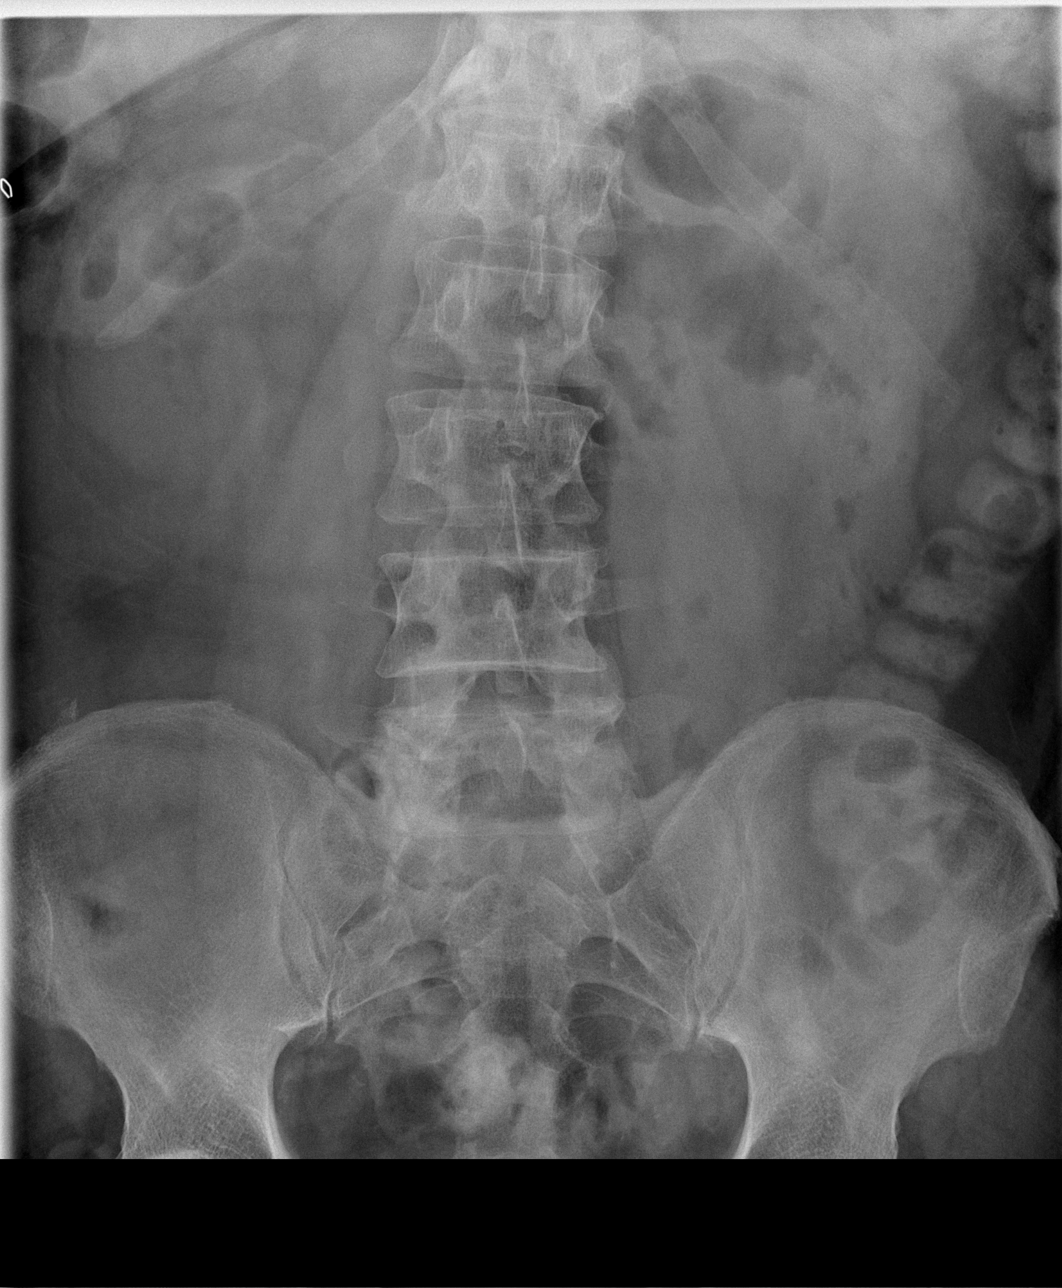

[t abdomen supine (2 of 2)]
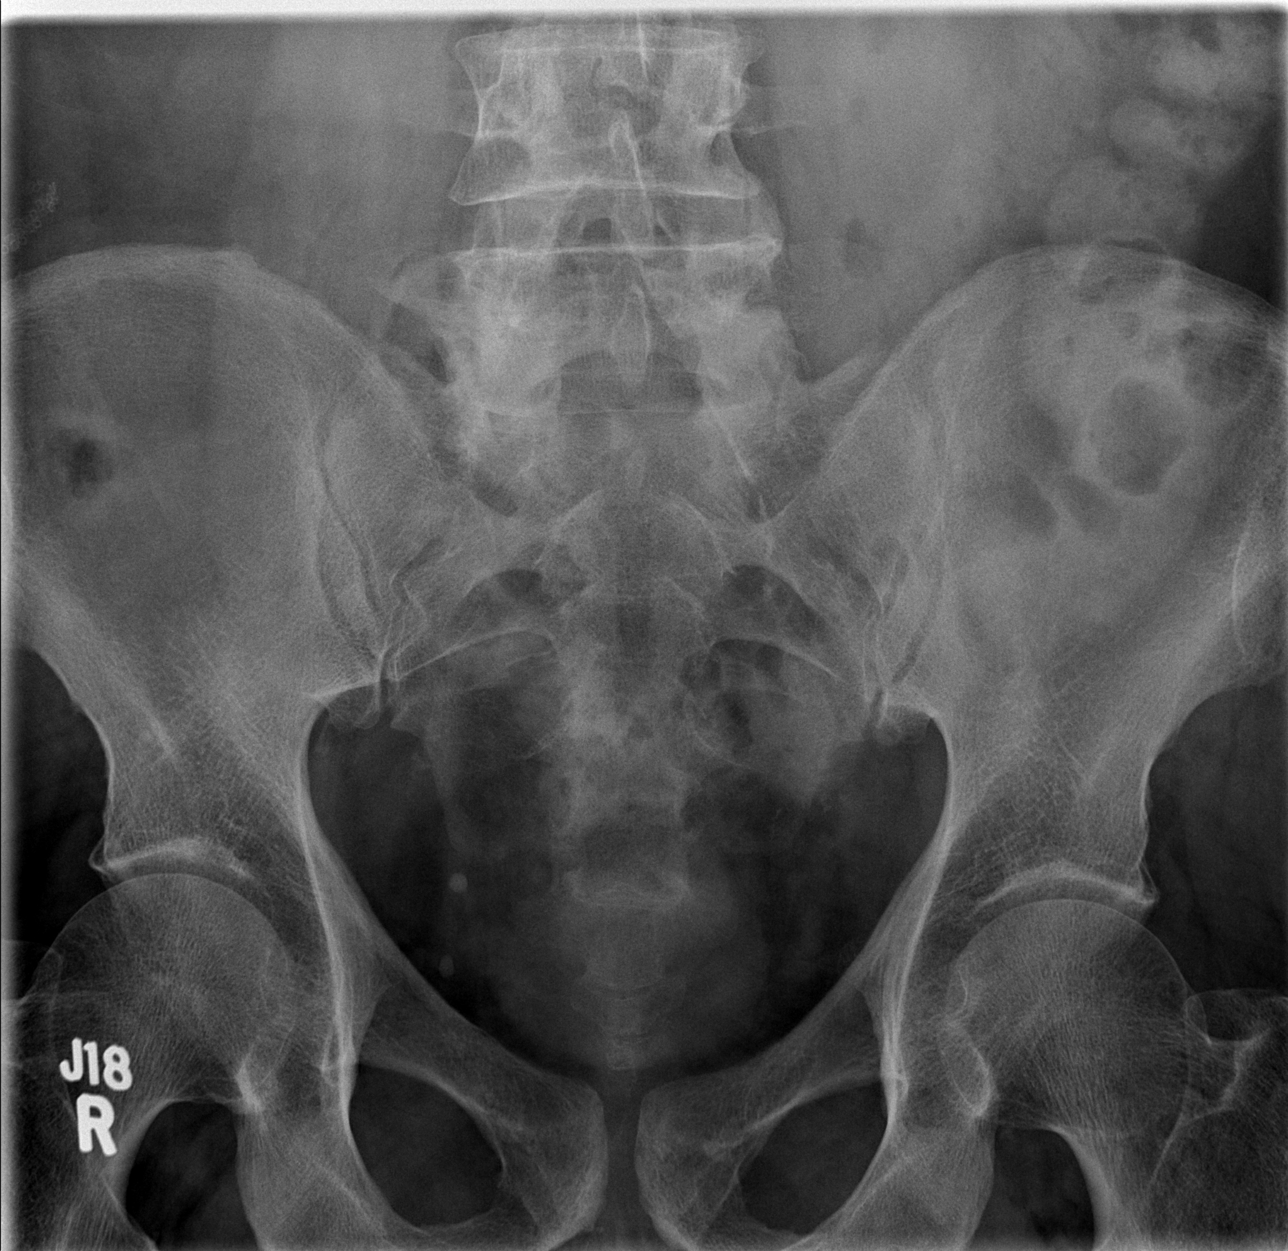

[3 of 3 positions shown; findings below may reference images not displayed]

FINDINGS: Nasogastric tube tip is near the duodenum bulb region. There is oral
contrast in the left colon. Few gas-filled loops of bowel in the mid
abdomen. Few air-fluid levels in the right upper abdomen. No
evidence for free air.
IMPRESSION: The degree of small bowel obstruction has decreased since
08/03/2013. Oral contrast has advanced into the left colon. There
continues to be a few gas-filled loops of small bowel and findings
probably represent a partial small bowel obstruction.

## 2016-04-27 ENCOUNTER — Telehealth: Payer: Self-pay | Admitting: Acute Care

## 2016-04-27 NOTE — Telephone Encounter (Signed)
[facility] x 1 for pt - Needs [facility] and CT - Referral no longer in workque - Paper referral in [place] file

## 2016-05-10 DIAGNOSIS — H2513 Age-related nuclear cataract, bilateral: Secondary | ICD-10-CM | POA: Diagnosis not present

## 2016-05-10 DIAGNOSIS — H40033 Anatomical narrow angle, bilateral: Secondary | ICD-10-CM | POA: Diagnosis not present

## 2016-05-10 LAB — HM DIABETES EYE EXAM

## 2016-05-17 NOTE — Telephone Encounter (Signed)
Spoke with pt's daughter .  She states that pt does not want to be seen in the lung screening program.  Letter sent to Dr Yong Channel to make him aware.

## 2016-05-25 ENCOUNTER — Telehealth: Payer: Self-pay | Admitting: Family Medicine

## 2016-05-25 ENCOUNTER — Other Ambulatory Visit: Payer: Self-pay

## 2016-05-25 DIAGNOSIS — I10 Essential (primary) hypertension: Secondary | ICD-10-CM

## 2016-05-25 DIAGNOSIS — Z794 Long term (current) use of insulin: Secondary | ICD-10-CM

## 2016-05-25 DIAGNOSIS — E118 Type 2 diabetes mellitus with unspecified complications: Secondary | ICD-10-CM

## 2016-05-25 MED ORDER — TELMISARTAN 40 MG PO TABS: 40.0000 mg | ORAL_TABLET | Freq: Every day | ORAL | 3 refills | Status: DC

## 2016-05-25 NOTE — Telephone Encounter (Signed)
° °  Pt is out of the below med and is requesting that it be sent to [facility]    Pt request refill of the following:   telmisartan (MICARDIS) 40 MG tablet    Phamacy:   [facility]

## 2016-05-28 MED ORDER — TELMISARTAN 40 MG PO TABS
40.0000 mg | ORAL_TABLET | Freq: Every day | ORAL | 3 refills | Status: DC
Start: 2016-05-28 — End: 2017-03-29

## 2016-05-28 NOTE — Telephone Encounter (Signed)
Medication was refilled.

## 2016-06-21 ENCOUNTER — Encounter: Payer: Self-pay | Admitting: Family Medicine

## 2016-06-21 ENCOUNTER — Ambulatory Visit (INDEPENDENT_AMBULATORY_CARE_PROVIDER_SITE_OTHER): Payer: Medicare Other | Admitting: Family Medicine

## 2016-06-21 VITALS — BP 110/70 | HR 101 | Temp 97.9°F | Ht 72.0 in | Wt 252.2 lb

## 2016-06-21 DIAGNOSIS — E038 Other specified hypothyroidism: Secondary | ICD-10-CM | POA: Diagnosis not present

## 2016-06-21 DIAGNOSIS — B351 Tinea unguium: Secondary | ICD-10-CM

## 2016-06-21 DIAGNOSIS — M1712 Unilateral primary osteoarthritis, left knee: Secondary | ICD-10-CM

## 2016-06-21 DIAGNOSIS — Z7289 Other problems related to lifestyle: Secondary | ICD-10-CM

## 2016-06-21 DIAGNOSIS — Z1159 Encounter for screening for other viral diseases: Secondary | ICD-10-CM

## 2016-06-21 DIAGNOSIS — Z87891 Personal history of nicotine dependence: Secondary | ICD-10-CM

## 2016-06-21 DIAGNOSIS — E118 Type 2 diabetes mellitus with unspecified complications: Secondary | ICD-10-CM

## 2016-06-21 DIAGNOSIS — M25562 Pain in left knee: Secondary | ICD-10-CM | POA: Diagnosis not present

## 2016-06-21 DIAGNOSIS — G8929 Other chronic pain: Secondary | ICD-10-CM | POA: Diagnosis not present

## 2016-06-21 HISTORY — DX: Tinea unguium: B35.1

## 2016-06-21 HISTORY — DX: Unilateral primary osteoarthritis, left knee: M17.12

## 2016-06-21 MED ORDER — METHYLPREDNISOLONE ACETATE 80 MG/ML IJ SUSP
80.0000 mg | Freq: Once | INTRAMUSCULAR | Status: AC
  Administered 2016-06-21: 80 mg via INTRA_ARTICULAR

## 2016-06-21 NOTE — Assessment & Plan Note (Signed)
S: left knee pain for about a month. No falls or injury. Started all of a sudden. History of that knee bothering him- last time was 4-5 years ago. Has been told by prior docs that has OA in that knee. Cortisone injections have helped in the past. With IBD history to avoid nsaids. Tylenol not helpful.  O: medial joint line tenderness as well as lateral but less severe joint line tenderness. Some crepitus. Normal ligamentous strength  Consent obtained and verified for Left Knee injection Sterile betadine prep. Furthur cleansed with alcohol. Topical analgesic spray: Ethyl chloride. Joint: left knee Approached in typical fashion with: anterio medial approach Completed without difficulty Meds: 4 cc lidocaine without epinephrine and 1 cc of depo medrol 80 mg/cc Needle:1 1/2 inch 25 gauge needle Aftercare instructions and Red flags advised.  A/P: Suspected OA- patient specifically requests injection today which was provided. Immediate improvement noted. Would not repeat sooner than 4 months likely if needed but hopeful for longer term relief like last injection. We discussed some risk for worsening of diabetes but hopeful with recent lost weight in addition to last a1c under 7 will remain below 7

## 2016-06-21 NOTE — Progress Notes (Signed)
Pre visit review using our clinic review tool, if applicable. No additional management support is needed unless otherwise documented below in the visit note. 

## 2016-06-21 NOTE — Assessment & Plan Note (Signed)
Right great toenail thickened, raised, yellow. Has already lost left great toenail. Prefers not to go back to podiatry for unclear reason. Patient would prefer primary care to do due to copay for specialist. We considered Dr. Doran Durand- who daughter had worked with in past when she worked at [employer]. Discussed case with Dr. Juleen China at [facility] site who is experienced in nail removal- will have them set up a visit with Dr. Juleen China in coming weeks

## 2016-06-21 NOTE — Assessment & Plan Note (Signed)
S: suspect controlled. On no rx. Working on weight loss.  CBGs- no meter/not checking Exercise and diet- has lost 11 lbs after finding out about diabetes diagnosis Lab Results  Component Value Date   HGBA1C 6.9 (H) 01/13/2016   HGBA1C 6.2 04/26/2014   HGBA1C 6.7 (H) 01/25/2014   A/P: continue without meds for now- update a1c

## 2016-06-21 NOTE — Patient Instructions (Addendum)
Please stop by lab before you go  [place] job on weight loss! Hopefully a1c below 7 and can stay off medication  Will let you know about Dr. Doran Durand vs. Dr. Juleen China- likely send mychart message with plan  Knee may get worse tonight. Ice/ace wrap are your friend. Far worsening pain or expanding redness/swelling around knee or fever are reasons to follow up immediately

## 2016-06-21 NOTE — Progress Notes (Signed)
Subjective:  Bruce [name] is a 66 y.o. year old very pleasant male patient who presents for/with See problem oriented charting ROS- thickened right great toenail that is painful at times. No chest pain or shortness of breath. No hypoglycemia.    Past Medical History-  Patient Active Problem List   Diagnosis Date Noted  . Type II diabetes mellitus with manifestations ([place]) 08/17/2013    Priority: High  . Crohn's ileitis ([place]) 03/17/2012    Priority: High  . Osteoarthritis of left knee 06/21/2016    Priority: Medium  . Former smoker 12/02/2015    Priority: Medium  . Iron deficiency anemia 09/03/2013    Priority: Medium  . B12 deficiency anemia 09/03/2013    Priority: Medium  . HTN (hypertension) 03/17/2012    Priority: Medium  . Sleep apnea 03/17/2012    Priority: Medium  . Hypothyroidism 03/17/2012    Priority: Medium  . BPH (benign prostatic hypertrophy) 03/17/2012    Priority: Medium  . 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 Family history of colon cancer 01/26/2014    Priority: Low  . Constipation 05/20/2012    Priority: Low  . Obesity 03/17/2012    Priority: Low    Medications- reviewed and updated Current Outpatient Prescriptions  Medication Sig Dispense Refill  . acetaminophen (TYLENOL) 500 MG tablet Take 1,000 mg by mouth every 8 (eight) hours as needed for moderate pain.    Marland Kitchen aspirin 81 MG tablet Take 81 mg by mouth daily.      . Cyanocobalamin (B-12) 2500 MCG TABS Take 1 tablet by mouth daily.    . IRON PO Take 65 mg by mouth daily.    Marland Kitchen levothyroxine (SYNTHROID, LEVOTHROID) 175 MCG tablet Take 1 tablet (175 mcg total) by mouth daily. 90 tablet 3  . polyethylene glycol (MIRALAX / GLYCOLAX) packet Take 17 g by mouth daily.      Marland Kitchen telmisartan (MICARDIS) 40 MG tablet Take 1 tablet (40 mg total) by mouth daily. 90 tablet 3   No current facility-administered medications for this visit.     Objective: BP 110/70 (BP  Location: Left Arm, Patient Position: Sitting, Cuff Size: Large)   Pulse (!) 101   Temp 97.9 F (36.6 C) (Oral)   Ht 6' (1.829 m)   Wt 252 lb 3.2 oz (114.4 kg)   SpO2 95%   BMI 34.20 kg/m  Gen: NAD, resting comfortably Very hard of hearing CV: RRR no murmurs rubs or gallops Lungs: CTAB no crackles, wheeze, rhonchi Abdomen: soft/nontender/nondistended Ext: no edema Skin: warm, dry, see foot exam below See msk exam below  Diabetic Foot Exam - Simple   Simple Foot Form Diabetic Foot exam was performed with the following findings:  Yes 06/21/2016  9:29 PM  Visual Inspection No deformities, no ulcerations, no other skin breakdown bilaterally:  Yes Sensation Testing Intact to touch and monofilament testing bilaterally:  Yes Pulse Check Posterior Tibialis and Dorsalis pulse intact bilaterally:  Yes Comments No great toenail left foot. On right foot there is raised, thickened, yellow nail with some mild erythema at base of nail.     Assessment/Plan:  Osteoarthritis of left knee S: left knee pain for about a month. No falls or injury. Started all of a sudden. History of that knee bothering him- last time was 4-5 years ago. Has been told by prior docs that has OA in that knee. Cortisone injections have helped in the past. With IBD history to  avoid nsaids. Tylenol not helpful.  O: medial joint line tenderness as well as lateral but less severe joint line tenderness. Some crepitus. Normal ligamentous strength  Consent obtained and verified for Left Knee injection Sterile betadine prep. Furthur cleansed with alcohol. Topical analgesic spray: Ethyl chloride. Joint: left knee Approached in typical fashion with: anterio medial approach Completed without difficulty Meds: 4 cc lidocaine without epinephrine and 1 cc of depo medrol 80 mg/cc Needle:1 1/2 inch 25 gauge needle Aftercare instructions and Red flags advised.  A/P: Suspected OA- patient specifically requests injection today which  was provided. Immediate improvement noted. Would not repeat sooner than 4 months likely if needed but hopeful for longer term relief like last injection. We discussed some risk for worsening of diabetes but hopeful with recent lost weight in addition to last a1c under 7 will remain below 7  Type II diabetes mellitus with manifestations ([place]) S: suspect controlled. On no rx. Working on weight loss.  CBGs- no meter/not checking Exercise and diet- has lost 11 lbs after finding out about diabetes diagnosis Lab Results  Component Value Date   HGBA1C 6.9 (H) 01/13/2016   HGBA1C 6.2 04/26/2014   HGBA1C 6.7 (H) 01/25/2014   A/P: continue without meds for now- update a1c   Hypothyroidism S: Lab Results  Component Value Date   TSH 4.46 04/26/2014   On thyroid medication-levothyroxine 175 mcg A/P: update tsh today   Onychomycosis Right great toenail thickened, raised, yellow. Has already lost left great toenail. Prefers not to go back to podiatry for unclear reason. Patient would prefer primary care to do due to copay for specialist. We considered Dr. Doran Durand- who daughter had worked with in past when she worked at [employer]. Discussed case with Dr. Juleen China at [facility] site who is experienced in nail removal- will have them set up a visit with Dr. Juleen China in coming weeks  3-4 months reasonable with diabetes though not specifically discussed  Orders Placed This Encounter  Procedures  . Hemoglobin A1c    [place]  . TSH    Omega  . Hepatitis C Antibody    Meds ordered this encounter  Medications  . IRON PO    Sig: Take 65 mg by mouth daily.  . Cyanocobalamin (B-12) 2500 MCG TABS    Sig: Take 1 tablet by mouth daily.  . methylPREDNISolone acetate (DEPO-MEDROL) injection 80 mg   Return precautions advised.  Garret Reddish, MD

## 2016-06-21 NOTE — Assessment & Plan Note (Signed)
S: Lab Results  Component Value Date   TSH 4.46 04/26/2014   On thyroid medication-levothyroxine 175 mcg A/P: update tsh today

## 2016-06-22 ENCOUNTER — Encounter: Payer: Self-pay | Admitting: Family Medicine

## 2016-06-22 LAB — HEMOGLOBIN A1C: Hgb A1c MFr Bld: 6.7 % — ABNORMAL HIGH (ref 4.6–6.5)

## 2016-06-22 LAB — HEPATITIS C ANTIBODY: HCV Ab: NEGATIVE

## 2016-06-22 LAB — TSH: TSH: 4.33 u[IU]/mL (ref 0.35–4.50)

## 2016-06-22 NOTE — Progress Notes (Signed)
Called and left voicemail to schedule

## 2016-06-22 NOTE — Progress Notes (Signed)
Please schedule him and call

## 2016-06-26 ENCOUNTER — Ambulatory Visit: Payer: Medicare Other | Admitting: Family Medicine

## 2016-06-26 ENCOUNTER — Telehealth: Payer: Self-pay

## 2016-06-26 NOTE — Telephone Encounter (Signed)
LM for pt to call office back. He is currently coming to see Dr. Juleen China at 4:00 for a toe nail removal but we are unable to do the procedure bc we dont have the proper medical supplies. Pt's appointment needs to be cancelled and need to find out what podiatrist he would like to be referred too.

## 2016-06-26 NOTE — Telephone Encounter (Signed)
Tried calling pt back on home phone with no answer.

## 2016-06-27 NOTE — Telephone Encounter (Signed)
Referral has been entered for patient.

## 2016-10-18 ENCOUNTER — Encounter: Payer: Self-pay | Admitting: Family Medicine

## 2016-10-18 ENCOUNTER — Ambulatory Visit (INDEPENDENT_AMBULATORY_CARE_PROVIDER_SITE_OTHER): Payer: Medicare Other | Admitting: Family Medicine

## 2016-10-18 VITALS — BP 98/70 | HR 91 | Temp 97.9°F | Ht 72.0 in | Wt 247.8 lb

## 2016-10-18 DIAGNOSIS — M1712 Unilateral primary osteoarthritis, left knee: Secondary | ICD-10-CM

## 2016-10-18 DIAGNOSIS — I10 Essential (primary) hypertension: Secondary | ICD-10-CM

## 2016-10-18 DIAGNOSIS — M25562 Pain in left knee: Secondary | ICD-10-CM

## 2016-10-18 DIAGNOSIS — E118 Type 2 diabetes mellitus with unspecified complications: Secondary | ICD-10-CM

## 2016-10-18 LAB — POCT GLYCOSYLATED HEMOGLOBIN (HGB A1C): Hemoglobin A1C: 5.9

## 2016-10-18 MED ORDER — METHYLPREDNISOLONE ACETATE 80 MG/ML IJ SUSP
80.0000 mg | Freq: Once | INTRAMUSCULAR | Status: AC
  Administered 2016-10-18: 80 mg via INTRA_ARTICULAR

## 2016-10-18 NOTE — Patient Instructions (Signed)
Once again, Knee may get worse tonight. Ice/ace wrap are your friend. Far worsening pain or expanding redness/swelling around knee or fever are reasons to follow up immediately. Lets plan on repeating 4 months at the earliest. If the pain starts to worsen despite injection- lets get you into ortho. If lasts 3-4 months then comes back- consider trying your topical before you go out and are active.   If you have had your eye exam within the last year, please sign release of information at check out desk. If you have not had an eye exam within a year, please get one at this time as this is important for your diabetes care   Lab Results  Component Value Date   HGBA1C 5.9 10/18/2016  great job on the healthy eating- a1c continues to improve.

## 2016-10-18 NOTE — Assessment & Plan Note (Signed)
S: controlled on micardis 9m. no lightheadedness or orthostatic symptoms BP Readings from Last 3 Encounters:  10/18/16 98/70  06/21/16 110/70  12/02/15 118/72  A/P: We discussed blood pressure goal of <140/90. Continue current meds:  If he continues to lose weight, we discussed we may have to reduce his medications.

## 2016-10-18 NOTE — Assessment & Plan Note (Signed)
S: No current prescription. Has been losing weight with healthy eating Lab Results  Component Value Date   HGBA1C 5.9 10/18/2016  A/P: Controlled with A1c improvement. Continue weight loss efforts. Has had eye exam and asked him to sign a release of information.

## 2016-10-18 NOTE — Assessment & Plan Note (Signed)
S: Steroid injection completed 4 months ago and in the left knee and it gave him at least 3-4 months of relief. Last injection had been 4-5 years ago when prior physician told him he had arthritis. He noted over the last week or 2 that the pain started to come back. The pain is not all the time but primarily when he is out working at his house. [place]. If he is in house he can use a topical and it helps but unable to do that when out working. He states the prior injection really helped and he requested repeat today. Once again with IBD history-no NSAIDs. We discussed topical NSAIDs but he feels like his topicals already work for him but they do not help when he is out and about. Tylenol is not helpful once again.denies fall or injury. The pain can be severe when he is out and states level of pain makes him feel like he may need to fall O: Once again some medial joint line tenderness. No lateral joint line tenderness. Crepitus noted. Normal ligamentous strength  Verbal consent obtained and verified for left Knee injection Sterile betadine prep. Furthur cleansed with alcohol. Topical analgesic spray: Ethyl chloride. Joint: left knee Approached in typical fashion with: anteromedial approach Completed without difficulty Meds: 4 cc lidocaine without epinephrine and 1 cc of depo medrol 80 mg/cc Needle:1 1/2 inch 25 gauge needle Aftercare instructions and Red flags advised. A/P: Left knee osteoarthritis. Completed injection today. Hopeful for at least another 4 months of relief. Also see after visit summary. We checked a point-of-care A1c prior to injection to make sure not too big of a risk for his diabetes but a1c looks great less than 6.

## 2016-10-18 NOTE — Progress Notes (Signed)
Subjective:  Bruce [name] is a 66 y.o. year old very pleasant male patient who presents for/with See problem oriented charting ROS- denies redness around knee. Denies leg edema worsening. Minimal swelling in the knee. Does complain of knee pain. No instability.  Past Medical History-  Patient Active Problem List   Diagnosis Date Noted  . Type II diabetes mellitus with manifestations ([place]) 08/17/2013    Priority: High  . Crohn's ileitis ([place]) 03/17/2012    Priority: High  . Osteoarthritis of left knee 06/21/2016    Priority: Medium  . Former smoker 12/02/2015    Priority: Medium  . Iron deficiency anemia 09/03/2013    Priority: Medium  . B12 deficiency anemia 09/03/2013    Priority: Medium  . HTN (hypertension) 03/17/2012    Priority: Medium  . Sleep apnea 03/17/2012    Priority: Medium  . Hypothyroidism 03/17/2012    Priority: Medium  . BPH (benign prostatic hypertrophy) 03/17/2012    Priority: Medium  . 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 Family history of colon cancer 01/26/2014    Priority: Low  . Constipation 05/20/2012    Priority: Low  . Obesity 03/17/2012    Priority: Low  . Onychomycosis 06/21/2016    Medications- reviewed and updated Current Outpatient Prescriptions  Medication Sig Dispense Refill  . acetaminophen (TYLENOL) 500 MG tablet Take 1,000 mg by mouth every 8 (eight) hours as needed for moderate pain.    Marland Kitchen aspirin 81 MG tablet Take 81 mg by mouth daily.      . Cyanocobalamin (B-12) 2500 MCG TABS Take 1 tablet by mouth daily.    . Garlic 4696 MG CAPS Take 1 capsule by mouth daily.    . IRON PO Take 65 mg by mouth daily.    Marland Kitchen levothyroxine (SYNTHROID, LEVOTHROID) 175 MCG tablet Take 1 tablet (175 mcg total) by mouth daily. 90 tablet 3  . polyethylene glycol (MIRALAX / GLYCOLAX) packet Take 17 g by mouth daily.      Marland Kitchen telmisartan (MICARDIS) 40 MG tablet Take 1 tablet (40 mg total) by mouth daily. 90 tablet 3    No current facility-administered medications for this visit.     Objective: BP 98/70 (BP Location: Left Arm, Patient Position: Sitting, Cuff Size: Large)   Pulse 91   Temp 97.9 F (36.6 C) (Oral)   Ht 6' (1.829 m)   Wt 247 lb 12.8 oz (112.4 kg)   SpO2 93%   BMI 33.61 kg/m  Gen: NAD, resting comfortably CV: RRR no murmurs rubs or gallops Lungs: CTAB no crackles, wheeze, rhonchi Abdomen: Obese Ext: no edema Skin: warm, dry, No rash Musculoskeletal-see below  Assessment/Plan:  Osteoarthritis of left knee S: Steroid injection completed 4 months ago and in the left knee and it gave him at least 3-4 months of relief. Last injection had been 4-5 years ago when prior physician told him he had arthritis. He noted over the last week or 2 that the pain started to come back. The pain is not all the time but primarily when he is out working at his house. [place] land. If he is in house he can use a topical and it helps but unable to do that when out working. He states the prior injection really helped and he requested repeat today. Once again with IBD history-no NSAIDs. We discussed topical NSAIDs but he feels like his topicals already work for him but they do  not help when he is out and about. Tylenol is not helpful once again.denies fall or injury. The pain can be severe when he is out and states level of pain makes him feel like he may need to fall O: Once again some medial joint line tenderness. No lateral joint line tenderness. Crepitus noted. Normal ligamentous strength  Verbal consent obtained and verified for left Knee injection Sterile betadine prep. Furthur cleansed with alcohol. Topical analgesic spray: Ethyl chloride. Joint: left knee Approached in typical fashion with: anteromedial approach Completed without difficulty Meds: 4 cc lidocaine without epinephrine and 1 cc of depo medrol 80 mg/cc Needle:1 1/2 inch 25 gauge needle Aftercare instructions and Red flags  advised. A/P: Left knee osteoarthritis. Completed injection today. Hopeful for at least another 4 months of relief. Also see after visit summary. We checked a point-of-care A1c prior to injection to make sure not too big of a risk for his diabetes but a1c looks great less than 6.  HTN (hypertension) S: controlled on micardis 17m. no lightheadedness or orthostatic symptoms BP Readings from Last 3 Encounters:  10/18/16 98/70  06/21/16 110/70  12/02/15 118/72  A/P: We discussed blood pressure goal of <140/90. Continue current meds:  If he continues to lose weight, we discussed we may have to reduce his medications.   [place]: No current prescription. Has been losing weight with healthy eating Lab Results  Component Value Date   HGBA1C 5.9 10/18/2016  A/P: Controlled with A1c improvement. Continue weight loss efforts. Has had eye exam and asked him to sign a release of information.  Did not write on a AVS. Four-month follow-up reasonable.  Orders Placed This Encounter  Procedures  . POCT glycosylated hemoglobin (Hb A1C)   Meds ordered this encounter  Medications  . Garlic 17289MG CAPS    Sig: Take 1 capsule by mouth daily.  . methylPREDNISolone acetate (DEPO-MEDROL) injection 80 mg    Return precautions advised.  SGarret Reddish MD

## 2016-10-19 ENCOUNTER — Encounter: Payer: Self-pay | Admitting: Family Medicine

## 2016-11-01 ENCOUNTER — Encounter: Payer: Self-pay | Admitting: Family Medicine

## 2016-11-02 ENCOUNTER — Ambulatory Visit (INDEPENDENT_AMBULATORY_CARE_PROVIDER_SITE_OTHER): Payer: Medicare Other | Admitting: Family Medicine

## 2016-11-02 ENCOUNTER — Encounter: Payer: Self-pay | Admitting: Family Medicine

## 2016-11-02 VITALS — BP 100/62 | HR 90 | Temp 97.9°F | Ht 72.25 in | Wt 245.2 lb

## 2016-11-02 DIAGNOSIS — L989 Disorder of the skin and subcutaneous tissue, unspecified: Secondary | ICD-10-CM | POA: Diagnosis not present

## 2016-11-02 DIAGNOSIS — M1712 Unilateral primary osteoarthritis, left knee: Secondary | ICD-10-CM | POA: Diagnosis not present

## 2016-11-02 DIAGNOSIS — Z0000 Encounter for general adult medical examination without abnormal findings: Secondary | ICD-10-CM

## 2016-11-02 DIAGNOSIS — E038 Other specified hypothyroidism: Secondary | ICD-10-CM

## 2016-11-02 DIAGNOSIS — K50012 Crohn's disease of small intestine with intestinal obstruction: Secondary | ICD-10-CM

## 2016-11-02 DIAGNOSIS — N401 Enlarged prostate with lower urinary tract symptoms: Secondary | ICD-10-CM | POA: Diagnosis not present

## 2016-11-02 DIAGNOSIS — Z87891 Personal history of nicotine dependence: Secondary | ICD-10-CM

## 2016-11-02 DIAGNOSIS — Z23 Encounter for immunization: Secondary | ICD-10-CM | POA: Diagnosis not present

## 2016-11-02 DIAGNOSIS — E118 Type 2 diabetes mellitus with unspecified complications: Secondary | ICD-10-CM | POA: Diagnosis not present

## 2016-11-02 DIAGNOSIS — D519 Vitamin B12 deficiency anemia, unspecified: Secondary | ICD-10-CM | POA: Diagnosis not present

## 2016-11-02 DIAGNOSIS — D509 Iron deficiency anemia, unspecified: Secondary | ICD-10-CM

## 2016-11-02 DIAGNOSIS — I10 Essential (primary) hypertension: Secondary | ICD-10-CM | POA: Diagnosis not present

## 2016-11-02 DIAGNOSIS — R351 Nocturia: Secondary | ICD-10-CM

## 2016-11-02 LAB — LDL CHOLESTEROL, DIRECT: Direct LDL: 82 mg/dL

## 2016-11-02 LAB — CBC
HCT: 46.4 % (ref 39.0–52.0)
Hemoglobin: 15.4 g/dL (ref 13.0–17.0)
MCHC: 33.3 g/dL (ref 30.0–36.0)
MCV: 92 fl (ref 78.0–100.0)
Platelets: 416 10*3/uL — ABNORMAL HIGH (ref 150.0–400.0)
RBC: 5.04 Mil/uL (ref 4.22–5.81)
RDW: 14.5 % (ref 11.5–15.5)
WBC: 8.7 10*3/uL (ref 4.0–10.5)

## 2016-11-02 LAB — COMPREHENSIVE METABOLIC PANEL
ALT: 17 U/L (ref 0–53)
AST: 21 U/L (ref 0–37)
Albumin: 4 g/dL (ref 3.5–5.2)
Alkaline Phosphatase: 92 U/L (ref 39–117)
BUN: 13 mg/dL (ref 6–23)
CO2: 30 mEq/L (ref 19–32)
Calcium: 9.8 mg/dL (ref 8.4–10.5)
Chloride: 103 mEq/L (ref 96–112)
Creatinine, Ser: 1.11 mg/dL (ref 0.40–1.50)
GFR: 70.14 mL/min (ref >60.00)
Glucose, Bld: 91 mg/dL (ref 70–99)
Potassium: 4.8 mEq/L (ref 3.5–5.1)
Sodium: 139 mEq/L (ref 135–145)
Total Bilirubin: 0.6 mg/dL (ref 0.2–1.2)
Total Protein: 6.7 g/dL (ref 6.0–8.3)

## 2016-11-02 LAB — PSA
PSA: 6.74
PSA: 6.74 ng/mL — ABNORMAL HIGH (ref 0.10–4.00)

## 2016-11-02 LAB — LIPID PANEL
Cholesterol: 152 mg/dL (ref 0–200)
HDL: 36.6 mg/dL — ABNORMAL LOW (ref >39.00)
NonHDL: 115.02
Total CHOL/HDL Ratio: 4
Triglycerides: 201 mg/dL — ABNORMAL HIGH (ref 0.0–149.0)
VLDL: 40.2 mg/dL — ABNORMAL HIGH (ref 0.0–40.0)

## 2016-11-02 LAB — FERRITIN: Ferritin: 317.5 ng/mL (ref 22.0–322.0)

## 2016-11-02 LAB — VITAMIN B12: Vitamin B-12: 986 pg/mL — ABNORMAL HIGH (ref 211–911)

## 2016-11-02 LAB — TSH: TSH: 3.26 u[IU]/mL (ref 0.35–4.50)

## 2016-11-02 NOTE — Progress Notes (Signed)
Phone: [phone]  Subjective:  Patient presents today for their annual physical. Chief complaint-noted.   See problem oriented charting- ROS- full  review of systems was completed and negative including No chest pain or shortness of breath. No headache or blurry vision.  No low blood sugar.   The following were reviewed and entered/updated in epic: Past Medical History:  Diagnosis Date  . Anemia   . Bowel obstruction ([place])   . Chronic headaches   . Colon polyps   . Community acquired pneumonia 03/02/2015  . Crohn's disease ([place])   . Diverticulosis   . DM (diabetes mellitus) ([place])   . Gallstones   . Hemorrhoids   . Hypertension   . Hypothyroidism   . Insomnia   . Obesity   . Prostate enlargement   . Sleep apnea    Patient Active Problem List   Diagnosis Date Noted  . Type II diabetes mellitus with manifestations ([place]) 08/17/2013    Priority: High  . Crohn's ileitis ([place]) 03/17/2012    Priority: High  . Osteoarthritis of left knee 06/21/2016    Priority: Medium  . Former smoker 12/02/2015    Priority: Medium  . Iron deficiency anemia 09/03/2013    Priority: Medium  . B12 deficiency anemia 09/03/2013    Priority: Medium  . HTN (hypertension) 03/17/2012    Priority: Medium  . Sleep apnea 03/17/2012    Priority: Medium  . Hypothyroidism 03/17/2012    Priority: Medium  . BPH associated with nocturia 03/17/2012    Priority: Medium  . 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 Family history of colon cancer 01/26/2014    Priority: Low  . Constipation 05/20/2012    Priority: Low  . Obesity 03/17/2012    Priority: Low  . Onychomycosis 06/21/2016   Past Surgical History:  Procedure Laterality Date  . BOWEL RESECTION N/A 08/11/2013   x2- one at [place] one in 2015  . CHOLECYSTECTOMY    . COLONOSCOPY  01/19/2011   Procedure: COLONOSCOPY;  Surgeon: Rogene Houston, MD;  [facility];  Service: Endoscopy;  Laterality:  N/A;  9:30 am  . INCISION AND DRAINAGE PERIRECTAL ABSCESS N/A 09/08/2013   Procedure: IRRIGATION AND DEBRIDEMENT PERIRECTAL ABSCESS;  Surgeon: Imogene Burn. Georgette Dover, MD;  Location: [facility];  Service: General;  Laterality: N/A;  . LAPAROTOMY N/A 08/11/2013   Procedure: EXPLORATORY LAPAROTOMY ;  Surgeon: Joyice Faster. Cornett, MD;  Location: [place] [facility];  Service: General;  Laterality: N/A;  . NASAL SINUS SURGERY    . seventh nerve face    . spleenectomy     Punctured after a fall in 1963  . TONSILLECTOMY AND ADENOIDECTOMY      Family History  Problem Relation Age of Onset  . Colon cancer Mother   . Uterine cancer Mother   . Colon cancer Sister   . Healthy Sister   . Healthy Sister   . Crohn's disease Daughter   . Arthritis Daughter   . Healthy Son   . Brain cancer Father   . Other Daughter        accidental death  . Heart disease Maternal Grandmother   . Bladder Cancer Paternal Grandfather   . Irritable bowel syndrome Sister     Medications- reviewed and updated Current Outpatient Prescriptions  Medication Sig Dispense Refill  . acetaminophen (TYLENOL) 500 MG tablet Take 1,000 mg by mouth every 8 (eight) hours as needed for moderate pain.    Marland Kitchen aspirin  81 MG tablet Take 81 mg by mouth daily.      . Cyanocobalamin (B-12) 2500 MCG TABS Take 1 tablet by mouth daily.    . Garlic 6720 MG CAPS Take 1 capsule by mouth daily.    . IRON PO Take 65 mg by mouth daily.    Marland Kitchen levothyroxine (SYNTHROID, LEVOTHROID) 175 MCG tablet Take 1 tablet (175 mcg total) by mouth daily. 90 tablet 3  . polyethylene glycol (MIRALAX / GLYCOLAX) packet Take 17 g by mouth daily.      Marland Kitchen telmisartan (MICARDIS) 40 MG tablet Take 1 tablet (40 mg total) by mouth daily. 90 tablet 3   No current facility-administered medications for this visit.     Allergies-reviewed and updated Allergies  Allergen Reactions  . Lisinopril Cough  . Coconut Flavor Other (See Comments)    Does not eat because of crohn's     Social History    Social History  . Marital status: Married    Spouse name: N/A  . Number of children: 3  . Years of education: N/A   Occupational History  . retired Retired   Social History Main Topics  . Smoking status: Former Smoker    Packs/day: 4.00    Years: 40.00    Types: Cigarettes    Quit date: 12/24/2005  . Smokeless tobacco: Never Used  . Alcohol use No  . Drug use: No  . Sexual activity: Yes   Other Topics Concern  . None   Social History Narrative   Married. 2 living children. 1 deceased daughter car wreck. 2 grandkids      Retired from [employer].       H[place]ies: [place] and spends time    Objective: BP 100/62 (BP Location: Left Arm, Patient Position: Sitting, Cuff Size: Large)   Pulse 90   Temp 97.9 F (36.6 C) (Oral)   Ht 6' 0.25" (1.835 m)   Wt 245 lb 3.2 oz (111.2 kg)   SpO2 94%   BMI 33.03 kg/m  Gen: NAD, resting comfortably HEENT: Mucous membranes are moist. Oropharynx normal Neck: no thyromegaly CV: RRR no murmurs rubs or gallops Lungs: CTAB no crackles, wheeze, rhonchi Abdomen: soft/nontender/nondistended/normal bowel sounds. No rebound or guarding.  Ext: no edema Skin: warm, dry, scalp with brownish lesion with darker portion more toward center of scalp Neuro: grossly normal, moves all extremities, PERRLA Rectal: normal tone, diffusely enlarged prostate, no masses or tenderness  Assessment/Plan:  66 y.o. male presenting for annual physical.  Health Maintenance counseling: 1. Anticipatory guidance: Patient counseled regarding regular dental exams - q6 months advised, eye exams - 05/10/16 with 1 year follow up, wearing seatbelts.  2. Risk factor reduction:  Advised patient of need for regular exercise and diet rich and fruits and vegetables to reduce risk of heart attack and stroke. Exercise- some walking- advised to be more regular. Diet-advised weight loss and healthier eating- has a tleast cut down on  sweets/breads. Drinking more water.  Wt Readings from Last 3 Encounters:  11/02/16 245 lb 3.2 oz (111.2 kg)  10/18/16 247 lb 12.8 oz (112.4 kg)  06/21/16 252 lb 3.2 oz (114.4 kg)  3. Immunizations/screenings/ancillary studies- advised fall flu shot (today) and shingrix at pharmacy Immunization History  Administered Date(s) Administered  . Influenza, High Dose Seasonal PF 12/02/2015, 11/02/2016  . Influenza,inj,Quad PF,6+ Mos 01/06/2014  . Pneumococcal Conjugate-13 01/21/2014  . Pneumococcal Polysaccharide-23 04/03/2012  . Tdap 04/03/2012   4. Prostate  cancer screening- saw urology in the past 4-5 years ago- was on flomax in the past but had incontinence. PSA jumped up in 2015- will repeat today.  BPH noted in history.  Lab Results  Component Value Date   PSA 6.74 (H) 11/02/2016   PSA 3.09 01/25/2014   PSA 1.84 12/17/2012   5. Colon cancer screening - 01/22/2011 abstracted Dr. [name] - will try to get records to find out follow up 6. Skin cancer screening- advised regular sunscreen use. Lesion on his scalp is growing and bleeds at times- refer to dermatology  Status of chronic or acute concerns   Crohn's ileitis [facility]) Dr. [name]- history of crohn's disease and colitiis- last colonoscopy 2012. Last saw Dr. Hilarie Fredrickson in 2015- advised 6 month follow up- will refer back. Still using miralax daily for constipation. Denies abdominal pain or rectal bleeding  Type II diabetes mellitus with manifestations ([place]) Lab Results  Component Value Date   HGBA1C 5.9 10/18/2016  controlled without medication once again today.   Hypothyroidism Thyroid has been controlled on levothyroxine 175 mcg  Lab Results  Component Value Date   TSH 3.26 11/02/2016    Former smoker Quit 2005. 160 pack years. Declines lung cancer screening previously- now opts in.   HTN (hypertension) HTN- controlled on micardis  Osteoarthritis of left knee Knee injection 2 weeks ago very helpful to him   B12 deficiency  anemia Lab Results  Component Value Date   [id number] 403 (H) 11/02/2016  continue oral supplement  Iron deficiency anemia Ferritin normal today   Future Appointments Date Time Provider [place]  03/07/2017 2:15 PM Marin Olp, MD [facility] None  follow up  Orders Placed This Encounter  Procedures  . Flu vaccine HIGH DOSE PF  . CBC    [place]  . Comprehensive metabolic panel    [place]    Order Specific Question:   Has the patient fasted?    Answer:   No  . Lipid panel    [place]    Order Specific Question:   Has the patient fasted?    Answer:   No  . TSH    [place]  . PSA  . Ferritin  . Vitamin B12  . Urinalysis    Standing Status:   Future    Standing Expiration Date:   11/02/2017  . LDL cholesterol, direct  . Ambulatory referral to Dermatology    Referral Priority:   Routine    Referral Type:   Consultation    Referral Reason:   Specialty Services Required    Requested Specialty:   Dermatology    Number of Visits Requested:   1  . Ambulatory Referral for Lung Cancer Scre    Referral Priority:   Routine    Referral Type:   Consultation    Referral Reason:   Specialty Services Required    Number of Visits Requested:   1  . Ambulatory referral to Gastroenterology    Referral Priority:   Routine    Referral Type:   Consultation    Referral Reason:   Specialty Services Required    Number of Visits Requested:   1   Return precautions advised.  Garret Reddish, MD

## 2016-11-02 NOTE — Patient Instructions (Addendum)
Thank you for getting your flu shot today   Please stop by lab before you go  Several referrals below. Call us if you have not heard from [place] about these within 2 weeks.   No changes in meds today

## 2016-11-03 NOTE — Assessment & Plan Note (Signed)
Quit 2005. 160 pack years. Declines lung cancer screening previously- now opts in.

## 2016-11-03 NOTE — Assessment & Plan Note (Signed)
Lab Results  Component Value Date   HGBA1C 5.9 10/18/2016  controlled without medication once again today.

## 2016-11-03 NOTE — Assessment & Plan Note (Signed)
Ferritin normal today

## 2016-11-03 NOTE — Assessment & Plan Note (Signed)
HTN- controlled on micardis

## 2016-11-03 NOTE — Assessment & Plan Note (Signed)
Thyroid has been controlled on levothyroxine 175 mcg  Lab Results  Component Value Date   TSH 3.26 11/02/2016

## 2016-11-03 NOTE — Assessment & Plan Note (Signed)
Lab Results  Component Value Date   [id number] 532 (H) 11/02/2016  continue oral supplement

## 2016-11-03 NOTE — Assessment & Plan Note (Signed)
Knee injection 2 weeks ago very helpful to him

## 2016-11-03 NOTE — Assessment & Plan Note (Signed)
Dr. [name]- history of crohn's disease and colitiis- last colonoscopy 2012. Last saw Dr. Hilarie Fredrickson in 2015- advised 6 month follow up- will refer back. Still using miralax daily for constipation. Denies abdominal pain or rectal bleeding

## 2016-11-06 ENCOUNTER — Other Ambulatory Visit: Payer: Self-pay

## 2016-11-06 DIAGNOSIS — R972 Elevated prostate specific antigen [PSA]: Secondary | ICD-10-CM

## 2016-11-08 ENCOUNTER — Telehealth: Payer: Self-pay | Admitting: Acute Care

## 2016-11-09 ENCOUNTER — Other Ambulatory Visit: Payer: Self-pay | Admitting: Acute Care

## 2016-11-09 DIAGNOSIS — Z122 Encounter for screening for malignant neoplasm of respiratory organs: Secondary | ICD-10-CM

## 2016-11-09 DIAGNOSIS — Z87891 Personal history of nicotine dependence: Secondary | ICD-10-CM

## 2016-11-09 NOTE — Telephone Encounter (Signed)
Will forward to the lung nodule pool 

## 2016-11-09 NOTE — Telephone Encounter (Signed)
Spoke with pt and scheduled for [facility] 11/12/16 2:30 CT ordered Nothing further needed

## 2016-11-12 ENCOUNTER — Encounter: Payer: Self-pay | Admitting: Acute Care

## 2016-11-12 ENCOUNTER — Ambulatory Visit (INDEPENDENT_AMBULATORY_CARE_PROVIDER_SITE_OTHER): Payer: Medicare Other | Admitting: Acute Care

## 2016-11-12 ENCOUNTER — Ambulatory Visit (INDEPENDENT_AMBULATORY_CARE_PROVIDER_SITE_OTHER)
Admission: RE | Admit: 2016-11-12 | Discharge: 2016-11-12 | Disposition: A | Discharge status: Home / Self Care | Payer: Medicare Other | Source: Ambulatory Visit | Attending: Acute Care | Admitting: Acute Care

## 2016-11-12 DIAGNOSIS — Z87891 Personal history of nicotine dependence: Secondary | ICD-10-CM

## 2016-11-12 DIAGNOSIS — Z122 Encounter for screening for malignant neoplasm of respiratory organs: Secondary | ICD-10-CM

## 2016-11-12 NOTE — Progress Notes (Signed)
Shared Decision Making Visit Lung Cancer Screening Program [phone])   Eligibility:  Age 66 y.o.  Pack Years Smoking History Calculation 160-pack-year smoking history (# packs/per year x # years smoked)  Recent History of coughing up blood  no  Unexplained weight loss? no ( >Than 15 pounds within the last 6 months )  Prior History Lung / other cancer no (Diagnosis within the last 5 years already requiring surveillance chest CT Scans).  Smoking Status Former Smoker  Former Smokers: Years since quit: 10 years, 11 months  Quit Date: 12/24/2005  Visit Components:  Discussion included one or more decision making aids. Yes  Discussion included risk/benefits of screening. yes  Discussion included potential follow up diagnostic testing for abnormal scans. yes  Discussion included meaning and risk of over diagnosis. yes  Discussion included meaning and risk of False Positives. yes  Discussion included meaning of total radiation exposure. yes  Counseling Included:  Importance of adherence to annual lung cancer LDCT screening. yes  Impact of comorbidities on ability to participate in the program. yes  Ability and willingness to under diagnostic treatment. yes  Smoking Cessation Counseling:  Current Smokers:   Discussed importance of smoking cessation. NA  Information about tobacco cessation classes and interventions provided to patient. yes  Patient provided with "ticket" for LDCT Scan. yes  Symptomatic Patient. no  Counseling  Diagnosis Code: Tobacco Use Z72.0  Asymptomatic Patient yes  Counseling (Intermediate counseling: > three minutes counseling) Y6599  Former Smokers:   Discussed the importance of maintaining cigarette abstinence. yes  Diagnosis Code: Personal History of Nicotine Dependence. J57.017  Information about tobacco cessation classes and interventions provided to patient. Yes  Patient provided with "ticket" for LDCT Scan. yes  Written Order  for Lung Cancer Screening with LDCT placed in Epic. Yes (CT Chest Lung Cancer Screening Low Dose W/O CM) [id number] Z12.2-Screening of respiratory organs Z87.891-Personal history of nicotine dependence  I spent 25 minutes of face to face time with [name] discussing the risks and benefits of lung cancer screening. We viewed a power point together that explained in detail the above noted topics. We took the time to pause the power point at intervals to allow for questions to be asked and answered to ensure understanding. We discussed that he had taken the single most powerful action possible to decrease his risk of developing lung cancer when he quit smoking. I counseled [name] to remain smoke free, and to contact me if he ever had the desire to smoke again so that I can provide resources and tools to help support the effort to remain smoke free. We discussed the time and location of the scan, and that either  Doroteo Glassman RN or I will call with the results within  24-48 hours of receiving them. [name] has my card and contact information in the event he needs to speak with me, in addition to a copy of the power point we reviewed as a resource. He verbalized understanding of all of the above and had no further questions upon leaving the office.    I explained to the patient that there has been a high incidence of coronary artery disease noted on these exams. I explained that this is a non-gated exam therefore degree or severity cannot be determined. This patient is not on statin therapy. I have asked the patient to follow-up with their PCP regarding any incidental finding of coronary artery disease and management with diet or medication as they feel is  clinically indicated. The patient verbalized understanding of the above and had no further questions.     Magdalen Spatz, NP 11/12/2016

## 2016-11-29 ENCOUNTER — Other Ambulatory Visit: Payer: Self-pay | Admitting: Acute Care

## 2016-11-29 ENCOUNTER — Encounter: Payer: Self-pay | Admitting: *Deleted

## 2016-11-29 DIAGNOSIS — Z87891 Personal history of nicotine dependence: Secondary | ICD-10-CM

## 2016-11-29 DIAGNOSIS — Z122 Encounter for screening for malignant neoplasm of respiratory organs: Secondary | ICD-10-CM

## 2016-11-30 ENCOUNTER — Encounter: Payer: Self-pay | Admitting: Family Medicine

## 2016-12-04 ENCOUNTER — Other Ambulatory Visit: Payer: Self-pay

## 2016-12-04 DIAGNOSIS — N401 Enlarged prostate with lower urinary tract symptoms: Secondary | ICD-10-CM

## 2016-12-04 DIAGNOSIS — D229 Melanocytic nevi, unspecified: Secondary | ICD-10-CM

## 2016-12-04 DIAGNOSIS — R351 Nocturia: Principal | ICD-10-CM

## 2016-12-27 ENCOUNTER — Other Ambulatory Visit: Payer: Self-pay

## 2016-12-27 DIAGNOSIS — E039 Hypothyroidism, unspecified: Secondary | ICD-10-CM

## 2016-12-27 MED ORDER — LEVOTHYROXINE SODIUM 175 MCG PO TABS: 175.0000 ug | ORAL_TABLET | Freq: Every day | ORAL | 3 refills | Status: DC

## 2017-02-05 ENCOUNTER — Encounter: Payer: Self-pay | Admitting: Internal Medicine

## 2017-02-05 ENCOUNTER — Ambulatory Visit: Payer: Medicare Other | Admitting: Internal Medicine

## 2017-02-05 VITALS — BP 108/58 | HR 70 | Ht 72.0 in | Wt 245.0 lb

## 2017-02-05 DIAGNOSIS — Z8719 Personal history of other diseases of the digestive system: Secondary | ICD-10-CM | POA: Diagnosis not present

## 2017-02-05 DIAGNOSIS — Z800 Family history of malignant neoplasm of digestive organs: Secondary | ICD-10-CM | POA: Diagnosis not present

## 2017-02-05 MED ORDER — SUPREP BOWEL PREP KIT 17.5-3.13-1.6 GM/177ML PO SOLN: 1.0000 | ORAL | 0 refills | Status: DC

## 2017-02-05 NOTE — Patient Instructions (Addendum)
You have been scheduled for a colonoscopy. Please follow written instructions given to you at your visit today.  Please pick up your prep supplies at the pharmacy within the next 1-3 days. If you use inhalers (even only as needed), please bring them with you on the day of your procedure. Your physician has requested that you go to www.startemmi.com and enter the access code given to you at your visit today. This web site gives a general overview about your procedure. However, you should still follow specific instructions given to you by our office regarding your preparation for the procedure.  Discontinue Iron.  Continue your B12.  Continue Miralax.  If you are age 78 or older, your body mass index should be between 23-30. Your Body mass index is 33.23 kg/m. If this is out of the aforementioned range listed, please consider follow up with your Primary Care Provider.  If you are age 46 or younger, your body mass index should be between 19-25. Your Body mass index is 33.23 kg/m. If this is out of the aformentioned range listed, please consider follow up with your Primary Care Provider.

## 2017-02-05 NOTE — Progress Notes (Signed)
Subjective:    Patient ID: Bruce [name], male    DOB: 04/20/1950, 66 y.o.   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  He has not been seen in the last 3 years.  He has not been on medication for his Crohn's disease.  He also has a daughter with Crohn's disease and a family history of colon cancer in his sister.  He reports that he does very well and denies abdominal pain or issues with diarrhea or constipation unless he "eats something I should not".  He states that if he eats [place] or [place] food he often feels mid abdominal pain.  He also feels abdominal discomfort with constipation if he eats rice, peanut butter and cheese and so he avoids these foods.  With dietary avoidance he denies abdominal pain.  He will have 3-4 loose stools per day but he uses MiraLAX.  He uses MiraLAX 17 g daily to avoid constipation.  He denies blood in his stool and melena.  Denies upper GI complaints including heartburn, dysphagia and odynophagia.  He has continued oral iron which he states he has been on since his surgery in 2015.  She is taking over-the-counter iron at 65 mg daily.  He also continues a baby aspirin, his levothyroxine and telmisartan.  His last colonoscopy was in 2012.  Review of Systems As per HPI, otherwise negative  Current Medications, Allergies, Past Medical History, Past Surgical History, Family History and Social History were reviewed in [employer] record.     Objective:   Physical Exam BP (!) 108/58   Pulse 70   Ht 6' (1.829 m)   Wt 245 lb (111.1 kg)   BMI 33.23 kg/m  Constitutional: Well-developed and well-nourished. No distress. HEENT: Normocephalic and atraumatic. Oropharynx is clear and moist. Conjunctivae are normal.  No scleral icterus. Neck:  Neck supple. Trachea midline. Cardiovascular: Normal rate, regular rhythm and intact distal pulses. No M/R/G Pulmonary/chest: Effort normal and breath sounds normal. No wheezing, rales or rhonchi. Abdominal: Soft, obese, well-healed transverse abdominal scar with umbilical hernia, abdomen is tender in the right mid and lower quadrant, nondistended. Bowel sounds active throughout.  Extremities: no clubbing, cyanosis, or edema Skin: Skin is warm and dry. Psychiatric: Normal mood and affect. Behavior is normal.  CBC    Component Value Date/Time   WBC 8.7 11/02/2016 1511   RBC 5.04 11/02/2016 1511   HGB 15.4 11/02/2016 1511   HCT 46.4 11/02/2016 1511   PLT 416.0 (H) 11/02/2016 1511   MCV 92.0 11/02/2016 1511   MCH 24.6 (L) 09/09/2013 0040   MCHC 33.3 11/02/2016 1511   RDW 14.5 11/02/2016 1511   LYMPHSABS 1.8 01/25/2014 1323   MONOABS 0.8 01/25/2014 1323   EOSABS 0.4 01/25/2014 1323   BASOSABS 0.1 01/25/2014 1323   CMP     Component Value Date/Time   NA 139 11/02/2016 1511   K 4.8 11/02/2016 1511   CL 103 11/02/2016 1511   CO2 30 11/02/2016 1511   GLUCOSE 91 11/02/2016 1511   BUN 13 11/02/2016 1511   CREATININE 1.11 11/02/2016 1511   CREATININE 1.11 12/07/2013 1533   CALCIUM 9.8 11/02/2016 1511   PROT 6.7 11/02/2016 1511   ALBUMIN 4.0 11/02/2016 1511   AST 21 11/02/2016 1511   ALT 17 11/02/2016  5436   GOVPCHE 03 11/02/2016 1511   BILITOT 0.6 11/02/2016 1511   GFRNONAA 70 (L) 09/07/2013 1850   GFRAA 81 (L) 09/07/2013 1850   Iron/TIBC/Ferritin/ %Sat    Component Value Date/Time   IRON 67 01/25/2014 1323   FERRITIN 317.5 11/02/2016 1511   IRONPCTSAT 17.9 (L) 01/25/2014 1323        Assessment & Plan:  66 year old male with a history of ileal Crohn's disease diagnosed as ileitis in 2009 with ileal stricture resulting in bowel obstruction status post ileocecectomy in June 2015 who is here for follow-up.  1.  Ileal Crohn's disease status post resection --no known  recurrence of his active ileitis since his surgery in 2015.  However he has not had repeat colonoscopy since this time.  He will be having colonoscopy, see #2 at which point we will evaluate the neoterminal ileum.  For now he will remain off of Crohn's therapy and continue a relatively low residue diet.  He will also continue MiraLAX to avoid constipation. --I recommended that he continue lifelong oral B12 given his prior ileal resection  2.  Family history of colon cancer --he is due colonoscopy for screening at this time.  His last exam was in 2012.  We discussed the risks, benefits and alternatives and he is agreeable and wishes to proceed.  3.  Umbilical hernia --he does not wish for further surgery.  This is not symptomatic at this time.  4.  History of iron deficiency --postsurgical only and ferritin earlier this year was elevated at 317.  I recommended he stop oral iron at this time.  25 minutes spent with the patient today. Greater than 50% was spent in counseling and coordination of care with the patient

## 2017-03-06 ENCOUNTER — Encounter: Payer: Self-pay | Admitting: Internal Medicine

## 2017-03-07 ENCOUNTER — Ambulatory Visit (INDEPENDENT_AMBULATORY_CARE_PROVIDER_SITE_OTHER): Payer: Medicare Other | Admitting: Family Medicine

## 2017-03-07 ENCOUNTER — Encounter: Payer: Self-pay | Admitting: Family Medicine

## 2017-03-07 VITALS — BP 92/70 | HR 100 | Temp 97.7°F | Ht 72.0 in | Wt 242.6 lb

## 2017-03-07 DIAGNOSIS — K50012 Crohn's disease of small intestine with intestinal obstruction: Secondary | ICD-10-CM | POA: Diagnosis not present

## 2017-03-07 DIAGNOSIS — E118 Type 2 diabetes mellitus with unspecified complications: Secondary | ICD-10-CM

## 2017-03-07 DIAGNOSIS — E039 Hypothyroidism, unspecified: Secondary | ICD-10-CM | POA: Diagnosis not present

## 2017-03-07 DIAGNOSIS — I10 Essential (primary) hypertension: Secondary | ICD-10-CM

## 2017-03-07 NOTE — Assessment & Plan Note (Signed)
S: Crohn's ileitis. Last saw Dr. Hilarie Fredrickson 2015 and 6 month follow up advised and referred back last visit- colonoscopy is planned later this month. No rectal bleeding or abdominal cramping A/P: continue GI follow up- currently on no rx

## 2017-03-07 NOTE — Assessment & Plan Note (Signed)
Update tsh. Has been controlled on Levothyroxine 175 mcg Lab Results  Component Value Date   TSH 3.26 11/02/2016

## 2017-03-07 NOTE — Patient Instructions (Addendum)
Try to cut the telmisartan in half. If you can- go ahead and cut in half and see me back in 2-3 weeks. If you cannot cut in half, just stop it and see me back in 2-3 weeks. Use a pill cutter.   Please stop by lab before you go

## 2017-03-07 NOTE — Assessment & Plan Note (Signed)
S:  controlled. On no rx. Down 3 lbs since last visit. Sees eye doctor next week Lab Results  Component Value Date   HGBA1C 5.9 10/18/2016   HGBA1C 6.7 (H) 06/21/2016   HGBA1C 6.9 (H) 01/13/2016   A/P: will update a1c- has continued to eat better and weight improving. Hopeful a1c remains below 6.5

## 2017-03-07 NOTE — Assessment & Plan Note (Signed)
S: controlled on micardis alone.  BP Readings from Last 3 Encounters:  03/07/17 92/70  02/05/17 (!) 108/58  11/02/16 100/62  A/P: We discussed blood pressure goal of <140/90. With how low he has been trending we discussed either trying half dose at 67m or just stop medicine if he cannot split with 2-3 week follow up.

## 2017-03-07 NOTE — Progress Notes (Signed)
Subjective:  Bruce [name] is a 67 y.o. year old very pleasant male patient who presents for/with See problem oriented charting ROS- no chest pain. No hypoglycemia. No rectal bleeding. No shortness of breath. No edema.    Past Medical History-  Patient Active Problem List   Diagnosis Date Noted  . Type II diabetes mellitus with manifestations ([place]) 08/17/2013    Priority: High  . Crohn's ileitis ([place]) 03/17/2012    Priority: High  . Osteoarthritis of left knee 06/21/2016    Priority: Medium  . Former smoker 12/02/2015    Priority: Medium  . Iron deficiency anemia 09/03/2013    Priority: Medium  . B12 deficiency anemia 09/03/2013    Priority: Medium  . HTN (hypertension) 03/17/2012    Priority: Medium  . Sleep apnea 03/17/2012    Priority: Medium  . Hypothyroidism 03/17/2012    Priority: Medium  . BPH associated with nocturia 03/17/2012    Priority: Medium  . 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 Family history of colon cancer 01/26/2014    Priority: Low  . Constipation 05/20/2012    Priority: Low  . Obesity 03/17/2012    Priority: Low  . Onychomycosis 06/21/2016    Medications- reviewed and updated Current Outpatient Medications  Medication Sig Dispense Refill  . acetaminophen (TYLENOL) 500 MG tablet Take 1,000 mg by mouth every 8 (eight) hours as needed for moderate pain.    Marland Kitchen aspirin 81 MG tablet Take 81 mg by mouth daily.      Marland Kitchen levothyroxine (SYNTHROID, LEVOTHROID) 175 MCG tablet Take 1 tablet (175 mcg total) by mouth daily. 90 tablet 3  . polyethylene glycol (MIRALAX / GLYCOLAX) packet Take 17 g by mouth daily.      Manus Gunning BOWEL PREP KIT 17.5-3.13-1.6 GM/177ML SOLN Take 1 kit by mouth as directed. 354 mL 0  . telmisartan (MICARDIS) 40 MG tablet Take 1 tablet (40 mg total) by mouth daily. 90 tablet 3   No current facility-administered medications for this visit.     Objective: BP 92/70 (BP Location: Left Arm, Patient  Position: Sitting, Cuff Size: Large)   Pulse 100   Temp 97.7 F (36.5 C) (Oral)   Ht 6' (1.829 m)   Wt 242 lb 9.6 oz (110 kg)   SpO2 94%   BMI 32.90 kg/m  BP remained low on repeat at 96/62 Gen: NAD, resting comfortably CV: RRR no murmurs rubs or gallops Lungs: CTAB no crackles, wheeze, rhonchi Ext: no edema Skin: warm, dry  Assessment/Plan:  Other issues 1. Refer to dermatology- will see march 3rd 2. PSA was up- has not heard from urolgoy yet- will check with scheduling to get this scheduled 3. Referred to lung cancer screening and done on 11/12/16 with 1 year follow up. DID note aortic atherosclerosis.  4. Knee injection 10/2016. He did not mention knee pain today.    HTN (hypertension) S: controlled on micardis alone.  BP Readings from Last 3 Encounters:  03/07/17 92/70  02/05/17 (!) 108/58  11/02/16 100/62  A/P: We discussed blood pressure goal of <140/90. With how low he has been trending we discussed either trying half dose at 32m or just stop medicine if he cannot split with 2-3 week follow up.   Type II diabetes mellitus with manifestations ([place] S:  controlled. On no rx. Down 3 lbs since last visit. Sees eye doctor next week Lab Results  Component Value Date   HGBA1C 5.9 10/18/2016  HGBA1C 6.7 (H) 06/21/2016   HGBA1C 6.9 (H) 01/13/2016   A/P: will update a1c- has continued to eat better and weight improving. Hopeful a1c remains below 6.5  Crohn's ileitis ([place]) S: Crohn's ileitis. Last saw Dr. Hilarie Fredrickson 2015 and 6 month follow up advised and referred back last visit- colonoscopy is planned later this month. No rectal bleeding or abdominal cramping A/P: continue GI follow up- currently on no rx   Hypothyroidism Update tsh. Has been controlled on Levothyroxine 175 mcg Lab Results  Component Value Date   TSH 3.26 11/02/2016      Future Appointments  Date Time Provider [place]  03/18/2017  2:30 PM Pyrtle, Lajuan Lines, MD [facility]  03/29/2017   1:45 PM Yong Channel Brayton Mars, MD [name]   Likely 4 months after next visit  Type 2 diabetes mellitus with complication, without long-term current use of insulin ([place]) - Plan: Comprehensive metabolic panel, Hemoglobin A1c  Hypothyroidism, unspecified type - Plan: TSH  Essential hypertension  Crohn's disease of ileum with intestinal obstruction ([place])  Return precautions advised.  Garret Reddish, MD

## 2017-03-08 LAB — COMPREHENSIVE METABOLIC PANEL
ALT: 13 U/L (ref 0–53)
AST: 19 U/L (ref 0–37)
Albumin: 4 g/dL (ref 3.5–5.2)
Alkaline Phosphatase: 91 U/L (ref 39–117)
BUN: 14 mg/dL (ref 6–23)
CO2: 24 mEq/L (ref 19–32)
Calcium: 9.2 mg/dL (ref 8.4–10.5)
Chloride: 103 mEq/L (ref 96–112)
Creatinine, Ser: 1.18 mg/dL (ref 0.40–1.50)
GFR: 65.29 mL/min (ref >60.00)
Glucose, Bld: 115 mg/dL — ABNORMAL HIGH (ref 70–99)
Potassium: 4.6 mEq/L (ref 3.5–5.1)
Sodium: 138 mEq/L (ref 135–145)
Total Bilirubin: 0.6 mg/dL (ref 0.2–1.2)
Total Protein: 6.7 g/dL (ref 6.0–8.3)

## 2017-03-08 LAB — HEMOGLOBIN A1C: Hgb A1c MFr Bld: 6.3 % (ref 4.6–6.5)

## 2017-03-08 LAB — TSH: TSH: 2.89 u[IU]/mL (ref 0.35–4.50)

## 2017-03-15 ENCOUNTER — Telehealth: Payer: Self-pay | Admitting: Internal Medicine

## 2017-03-15 MED ORDER — SUPREP BOWEL PREP KIT 17.5-3.13-1.6 GM/177ML PO SOLN: 1.0000 | ORAL | 0 refills | Status: DC

## 2017-03-15 NOTE — Telephone Encounter (Signed)
Rx sent 

## 2017-03-18 ENCOUNTER — Ambulatory Visit (AMBULATORY_SURGERY_CENTER): Payer: Medicare Other | Admitting: Internal Medicine

## 2017-03-18 ENCOUNTER — Encounter: Payer: Self-pay | Admitting: Internal Medicine

## 2017-03-18 ENCOUNTER — Other Ambulatory Visit: Payer: Self-pay

## 2017-03-18 VITALS — BP 101/75 | HR 86 | Temp 99.8°F | Resp 21 | Ht 74.0 in | Wt 242.0 lb

## 2017-03-18 DIAGNOSIS — K5010 Crohn's disease of large intestine without complications: Secondary | ICD-10-CM | POA: Diagnosis not present

## 2017-03-18 DIAGNOSIS — Z800 Family history of malignant neoplasm of digestive organs: Secondary | ICD-10-CM | POA: Diagnosis present

## 2017-03-18 DIAGNOSIS — K633 Ulcer of intestine: Secondary | ICD-10-CM | POA: Diagnosis not present

## 2017-03-18 DIAGNOSIS — I10 Essential (primary) hypertension: Secondary | ICD-10-CM | POA: Diagnosis not present

## 2017-03-18 MED ORDER — SODIUM CHLORIDE 0.9 % IV SOLN: 500.0000 mL | Freq: Once | INTRAVENOUS | Status: DC

## 2017-03-18 NOTE — Progress Notes (Signed)
States no allergy to soy or eggs.

## 2017-03-18 NOTE — Op Note (Signed)
[place] Patient Name: Bruce [name] Procedure Date: 03/18/2017 2:38 PM MRN: [id number] Endoscopist: Jerene Bears , MD Age: 67 Referring MD:  Date of Birth: Apr 10, 1950 Gender: Male Account #: [id number] Procedure:                Colonoscopy Indications:              Screening in patient at increased risk: Family                            history of 1st-degree relative with colorectal                            cancer, Last colonoscopy: 2012, Personal history of                            Crohn's disease Medicines:                Monitored Anesthesia Care Procedure:                Pre-Anesthesia Assessment:                           - Prior to the procedure, a History and Physical                            was performed, and patient medications and                            allergies were reviewed. The patient's tolerance of                            previous anesthesia was also reviewed. The risks                            and benefits of the procedure and the sedation                            options and risks were discussed with the patient.                            All questions were answered, and informed consent                            was obtained. Prior Anticoagulants: The patient has                            taken no previous anticoagulant or antiplatelet                            agents. ASA Grade Assessment: III - A patient with                            severe systemic disease. After reviewing the risks  and benefits, the patient was deemed in                            satisfactory condition to undergo the procedure.                           After obtaining informed consent, the colonoscope                            was passed under direct vision. Throughout the                            procedure, the patient's blood pressure, pulse, and                            oxygen saturations were monitored continuously. The                             Colonoscope was introduced through the anus and                            advanced to the the terminal ileum. The colonoscopy                            was performed without difficulty. The patient                            tolerated the procedure well. The quality of the                            bowel preparation was good. The terminal ileum,                            ileocecal valve, appendiceal orifice, and rectum                            were photographed. Scope In: 2:45:57 PM Scope Out: 3:02:27 PM Scope Withdrawal Time: 0 hours 12 minutes 40 seconds  Total Procedure Duration: 0 hours 16 minutes 30 seconds  Findings:                 The digital rectal exam findings mild anal stenosis.                           Inflammation, mild to moderate in severity and                            characterized by erosions, erythema and shallow                            ulcerations was found in the neo-terminal Ileum.                            Biopsies were taken with a cold forceps for  histology.                           There was evidence of a prior end-to-side                            ileo-colonic anastomosis at the ileocecal valve.                            This was patent. The anastomosis was traversed. The                            cecum is spared and intact.                           Normal mucosa was found in the entire colon.                           Scattered small-mouthed diverticula were found in                            the sigmoid colon, descending colon and ascending                            colon.                           Internal hemorrhoids were found during                            retroflexion. The hemorrhoids were small. Complications:            No immediate complications. Estimated Blood Loss:     Estimated blood loss was minimal. Impression:               - Crohn's disease with ileitis. Biopsied.                            - Patent end-to-side ileo-colonic anastomosis                            (cecum-sparing ileal resection).                           - Normal mucosa in the entire examined colon.                           - Mild diverticulosis in the sigmoid colon, in the                            descending colon and in the ascending colon.                           - Small internal hemorrhoids. Recommendation:           - Patient has a contact number available for  emergencies. The signs and symptoms of potential                            delayed complications were discussed with the                            patient. Return to normal activities tomorrow.                            Written discharge instructions were provided to the                            patient.                           - Resume previous diet.                           - Continue present medications.                           - Await pathology results.                           - Repeat colonoscopy in 5 years for screening                            purposes.                           - Return to my office to discuss ileal Crohn's                            disease at the next available appointment. Jerene Bears, MD 03/18/2017 3:14:18 PM This report has been signed electronically.

## 2017-03-18 NOTE — Progress Notes (Signed)
Report to PACU, RN, vss, BBS= Clear.  

## 2017-03-18 NOTE — Patient Instructions (Signed)
Information on Crohns given.  YOU HAD AN ENDOSCOPIC PROCEDURE TODAY AT [facility]:   Refer to the procedure report that was given to you for any specific questions about what was found during the examination.  If the procedure report does not answer your questions, please call your gastroenterologist to clarify.  If you requested that your care partner not be given the details of your procedure findings, then the procedure report has been included in a sealed envelope for you to review at your convenience later.  YOU SHOULD EXPECT: Some feelings of bloating in the abdomen. Passage of more gas than usual.  Walking can help get rid of the air that was put into your GI tract during the procedure and reduce the bloating. If you had a lower endoscopy (such as a colonoscopy or flexible sigmoidoscopy) you may notice spotting of blood in your stool or on the toilet paper. If you underwent a bowel prep for your procedure, you may not have a normal bowel movement for a few days.  Please Note:  You might notice some irritation and congestion in your nose or some drainage.  This is from the oxygen used during your procedure.  There is no need for concern and it should clear up in a day or so.  SYMPTOMS TO REPORT IMMEDIATELY:   Following lower endoscopy (colonoscopy or flexible sigmoidoscopy):  Excessive amounts of blood in the stool  Significant tenderness or worsening of abdominal pains  Swelling of the abdomen that is new, acute  Fever of 100F or higher  FFor urgent or emergent issues, a gastroenterologist can be reached at any hour by calling [phone].   DIET:  We do recommend a small meal at first, but then you may proceed to your regular diet.  Drink plenty of fluids but you should avoid alcoholic beverages for 24 hours.  ACTIVITY:  You should plan to take it easy for the rest of today and you should NOT DRIVE or use heavy machinery until tomorrow (because of the sedation  medicines used during the test).    FOLLOW UP: Our staff will call the number listed on your records the next business day following your procedure to check on you and address any questions or concerns that you may have regarding the information given to you following your procedure. If we do not reach you, we will leave a message.  However, if you are feeling well and you are not experiencing any problems, there is no need to return our call.  We will assume that you have returned to your regular daily activities without incident.  If any biopsies were taken you will be contacted by phone or by letter within the next 1-3 weeks.  Please call us at [phone] if you have not heard about the biopsies in 3 weeks.    SIGNATURES/CONFIDENTIALITY: You and/or your care partner have signed paperwork which will be entered into your electronic medical record.  These signatures attest to the fact that that the information above on your After Visit Summary has been reviewed and is understood.  Full responsibility of the confidentiality of this discharge information lies with you and/or your care-partner.

## 2017-03-18 NOTE — Progress Notes (Signed)
Called to room to assist during endoscopic procedure.  Patient ID and intended procedure confirmed with present staff. Received instructions for my participation in the procedure from the performing physician.  

## 2017-03-19 ENCOUNTER — Telehealth: Payer: Self-pay

## 2017-03-19 ENCOUNTER — Telehealth: Payer: Self-pay | Admitting: *Deleted

## 2017-03-19 NOTE — Telephone Encounter (Signed)
Called [phone] and left a messaged we tried to reach pt for a follow up call. maw

## 2017-03-19 NOTE — Telephone Encounter (Signed)
Attempted second follow up phone call, no answer.

## 2017-03-25 ENCOUNTER — Encounter: Payer: Self-pay | Admitting: Internal Medicine

## 2017-03-29 ENCOUNTER — Ambulatory Visit (INDEPENDENT_AMBULATORY_CARE_PROVIDER_SITE_OTHER): Payer: Medicare Other | Admitting: Family Medicine

## 2017-03-29 ENCOUNTER — Encounter: Payer: Self-pay | Admitting: Family Medicine

## 2017-03-29 VITALS — BP 90/66 | HR 95 | Temp 97.6°F | Ht 74.0 in | Wt 245.6 lb

## 2017-03-29 DIAGNOSIS — I10 Essential (primary) hypertension: Secondary | ICD-10-CM | POA: Diagnosis not present

## 2017-03-29 DIAGNOSIS — I700 Atherosclerosis of aorta: Secondary | ICD-10-CM | POA: Diagnosis not present

## 2017-03-29 NOTE — Progress Notes (Signed)
Subjective:  Bruce [name] is a 67 y.o. year old very pleasant male patient who presents for/with See problem oriented charting ROS- No chest pain or shortness of breath. No headache or blurry vision. Very hard of hearing   Past Medical History-  Patient Active Problem List   Diagnosis Date Noted  . Type II diabetes mellitus with manifestations ([place]) 08/17/2013    Priority: High  . Crohn's ileitis ([place]) 03/17/2012    Priority: High  . Osteoarthritis of left knee 06/21/2016    Priority: Medium  . Former smoker 12/02/2015    Priority: Medium  . Iron deficiency anemia 09/03/2013    Priority: Medium  . B12 deficiency anemia 09/03/2013    Priority: Medium  . HTN (hypertension) 03/17/2012    Priority: Medium  . Sleep apnea 03/17/2012    Priority: Medium  . Hypothyroidism 03/17/2012    Priority: Medium  . BPH associated with nocturia 03/17/2012    Priority: Medium  . 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 Family history of colon cancer 01/26/2014    Priority: Low  . Constipation 05/20/2012    Priority: Low  . Obesity 03/17/2012    Priority: Low  . Aortic atherosclerosis ([place]) 03/30/2017  . Onychomycosis 06/21/2016    Medications- reviewed and updated Current Outpatient Medications  Medication Sig Dispense Refill  . acetaminophen (TYLENOL) 500 MG tablet Take 1,000 mg by mouth every 8 (eight) hours as needed for moderate pain.    Marland Kitchen aspirin 81 MG tablet Take 81 mg by mouth daily.      Marland Kitchen levothyroxine (SYNTHROID, LEVOTHROID) 175 MCG tablet Take 1 tablet (175 mcg total) by mouth daily. 90 tablet 3  . polyethylene glycol (MIRALAX / GLYCOLAX) packet Take 17 g by mouth daily.     No current facility-administered medications for this visit.     Objective: BP 90/66 (BP Location: Left Arm, Patient Position: Sitting, Cuff Size: Large)   Pulse 95   Temp 97.6 F (36.4 C) (Oral)   Ht 6' 2"  (1.88 m)   Wt 245 lb 9.6 oz (111.4 kg)   SpO2 95%    BMI 31.53 kg/m  Gen: NAD, resting comfortably CV: RRR no murmurs rubs or gallops Lungs: CTAB no crackles, wheeze, rhonchi Abdomen: soft/nontender/nondistended/normal bowel sounds. obese Ext: no edema Skin: warm, dry  Assessment/Plan:  Other notes 1. Urology visit for elevated psa - referred in October- he missed message. Daughter Bruce [name] will help him schedule this- we called her and gave her the # Lab Results  Component Value Date   PSA 6.74 (H) 11/02/2016   PSA 3.09 01/25/2014   PSA 1.84 12/17/2012  2. Today,  No mention of knee pain today- prior injection 10/2016 3. Now that he is off ARB- will need urine microalbumin next visit  Aortic atherosclerosis ([place]) Noted on CT for lung cancer screening. We will target risk factor modification- hypertension controlled, diabetes at goal, lipid control (last LDL at 82).      HTN (hypertension) S: controlled on micardis 61m (halfing the 429mtablet) BP Readings from Last 3 Encounters:  03/29/17 90/66  03/18/17 101/75  03/07/17 92/70  A/P: We discussed blood pressure goal of <140/90. He is still running low on micardis 2066mo we opted to stop completely at this point. Patient tells me weight used to be nearly 270 and he has worked to eat better- down to 245 today- may be the reason he does not need rx at this  time   Future Appointments  Date Time Provider [place]  06/27/2017  1:45 PM Marin Olp, MD [name]   Return in about 3 months (around 06/27/2017) for follow up- or sooner if needed.  Return precautions advised.  Garret Reddish, MD

## 2017-03-29 NOTE — Patient Instructions (Signed)
Stop the telmisartan 18m. Your blood pressure looks much better these days- I dont think you need medicine for blood pressure right now.

## 2017-03-30 DIAGNOSIS — I700 Atherosclerosis of aorta: Secondary | ICD-10-CM | POA: Insufficient documentation

## 2017-03-30 HISTORY — DX: Atherosclerosis of aorta: I70.0

## 2017-03-30 NOTE — Assessment & Plan Note (Addendum)
S: controlled on micardis 74m (halfing the 487mtablet) BP Readings from Last 3 Encounters:  03/29/17 90/66  03/18/17 101/75  03/07/17 92/70  A/P: We discussed blood pressure goal of <140/90. Bruce [name] is still running low on micardis 2066mo we opted to stop completely at this point. Patient tells me weight used to be nearly 270 and Bruce [name] has worked to eat better- down to 245 today- may be the reason Bruce [name] does not need rx at this time

## 2017-03-30 NOTE — Assessment & Plan Note (Signed)
Noted on CT for lung cancer screening. We will target risk factor modification- hypertension controlled, diabetes at goal, lipid control (last LDL at 82).

## 2017-05-09 LAB — HM DIABETES EYE EXAM

## 2017-05-13 DIAGNOSIS — L820 Inflamed seborrheic keratosis: Secondary | ICD-10-CM | POA: Diagnosis not present

## 2017-05-13 DIAGNOSIS — D485 Neoplasm of uncertain behavior of skin: Secondary | ICD-10-CM | POA: Diagnosis not present

## 2017-05-21 ENCOUNTER — Encounter: Payer: Self-pay | Admitting: *Deleted

## 2017-05-27 DIAGNOSIS — R972 Elevated prostate specific antigen [PSA]: Secondary | ICD-10-CM | POA: Diagnosis not present

## 2017-05-27 DIAGNOSIS — N401 Enlarged prostate with lower urinary tract symptoms: Secondary | ICD-10-CM | POA: Diagnosis not present

## 2017-05-28 ENCOUNTER — Encounter: Payer: Self-pay | Admitting: Family Medicine

## 2017-06-05 ENCOUNTER — Ambulatory Visit: Payer: Medicare Other | Admitting: Internal Medicine

## 2017-06-05 ENCOUNTER — Other Ambulatory Visit (INDEPENDENT_AMBULATORY_CARE_PROVIDER_SITE_OTHER): Payer: Medicare Other

## 2017-06-05 ENCOUNTER — Encounter: Payer: Self-pay | Admitting: Internal Medicine

## 2017-06-05 VITALS — BP 140/90 | HR 64 | Ht 74.0 in | Wt 244.0 lb

## 2017-06-05 DIAGNOSIS — R1031 Right lower quadrant pain: Secondary | ICD-10-CM

## 2017-06-05 DIAGNOSIS — K529 Noninfective gastroenteritis and colitis, unspecified: Secondary | ICD-10-CM

## 2017-06-05 DIAGNOSIS — R197 Diarrhea, unspecified: Secondary | ICD-10-CM | POA: Diagnosis not present

## 2017-06-05 DIAGNOSIS — K5000 Crohn's disease of small intestine without complications: Secondary | ICD-10-CM | POA: Diagnosis not present

## 2017-06-05 DIAGNOSIS — Z86010 Personal history of colonic polyps: Secondary | ICD-10-CM | POA: Diagnosis not present

## 2017-06-05 DIAGNOSIS — Z862 Personal history of diseases of the blood and blood-forming organs and certain disorders involving the immune mechanism: Secondary | ICD-10-CM

## 2017-06-05 LAB — COMPREHENSIVE METABOLIC PANEL
ALT: 15 U/L (ref 0–53)
AST: 22 U/L (ref 0–37)
Albumin: 3.7 g/dL (ref 3.5–5.2)
Alkaline Phosphatase: 91 U/L (ref 39–117)
BUN: 12 mg/dL (ref 6–23)
CO2: 30 mEq/L (ref 19–32)
Calcium: 9.4 mg/dL (ref 8.4–10.5)
Chloride: 103 mEq/L (ref 96–112)
Creatinine, Ser: 1.09 mg/dL (ref 0.40–1.50)
GFR: 71.5 mL/min (ref >60.00)
Glucose, Bld: 89 mg/dL (ref 70–99)
Potassium: 4 mEq/L (ref 3.5–5.1)
Sodium: 138 mEq/L (ref 135–145)
Total Bilirubin: 0.6 mg/dL (ref 0.2–1.2)
Total Protein: 7.3 g/dL (ref 6.0–8.3)

## 2017-06-05 LAB — CBC WITH DIFFERENTIAL/PLATELET
Basophils Absolute: 0 10*3/uL (ref 0.0–0.1)
Basophils Relative: 0.3 % (ref 0.0–3.0)
Eosinophils Absolute: 0.2 10*3/uL (ref 0.0–0.7)
Eosinophils Relative: 3.1 % (ref 0.0–5.0)
HCT: 46.2 % (ref 39.0–52.0)
Hemoglobin: 15.8 g/dL (ref 13.0–17.0)
Lymphocytes Relative: 29.1 % (ref 12.0–46.0)
Lymphs Abs: 2.3 10*3/uL (ref 0.7–4.0)
MCHC: 34.2 g/dL (ref 30.0–36.0)
MCV: 89.4 fl (ref 78.0–100.0)
Monocytes Absolute: 1 10*3/uL (ref 0.1–1.0)
Monocytes Relative: 12 % (ref 3.0–12.0)
Neutro Abs: 4.4 10*3/uL (ref 1.4–7.7)
Neutrophils Relative %: 55.5 % (ref 43.0–77.0)
Platelets: 415 10*3/uL — ABNORMAL HIGH (ref 150.0–400.0)
RBC: 5.16 Mil/uL (ref 4.22–5.81)
RDW: 14.6 % (ref 11.5–15.5)
WBC: 8 10*3/uL (ref 4.0–10.5)

## 2017-06-05 LAB — C-REACTIVE PROTEIN: CRP: 1 mg/dL (ref 0.5–20.0)

## 2017-06-05 MED ORDER — MESALAMINE ER 500 MG PO CPCR: 500.0000 mg | ORAL_CAPSULE | Freq: Two times a day (BID) | ORAL | 2 refills | Status: DC

## 2017-06-05 MED ORDER — BUDESONIDE 3 MG PO CPEP: 9.0000 mg | ORAL_CAPSULE | Freq: Every day | ORAL | 1 refills | Status: DC

## 2017-06-05 NOTE — Patient Instructions (Addendum)
You have been scheduled for a follow up with Dr Hilarie Fredrickson on Tuesday, 08/20/17 at 1:45 pm.  Discontinue Miralax.  We have sent the following medications to your pharmacy for you to pick up at your convenience: Pentasa 1 gram twice daily Entocort 9 mg daily x 8 weeks  If you are age 68 or older, your body mass index should be between 23-30. Your Body mass index is 31.33 kg/m. If this is out of the aforementioned range listed, please consider follow up with your Primary Care Provider.  If you are age 107 or younger, your body mass index should be between 19-25. Your Body mass index is 31.33 kg/m. If this is out of the aformentioned range listed, please consider follow up with your Primary Care Provider.

## 2017-06-05 NOTE — Progress Notes (Signed)
Subjective:    Patient ID: Bruce [name], male    DOB: Jul 24, 1950, 67 y.o.   MRN: [id number]  HPI Bruce [name] is a 67 year old male with a history of ileal Crohn's disease diagnosed as ileitis in 2009 with ileal stricture status post ileocecectomy in June 2015 who is here for follow-up.  He was last seen on 02/05/2017 but prior to this had not been seen within the last 3 years and was not on Crohn's therapy.  He also has a family history of Crohn's disease in his daughter and a family history of colon cancer in his sister.  I performed a screening colonoscopy on 03/18/2017.  This revealed mild to moderate ileitis with erosions, erythema and ulcerations in the neoterminal ileum.  This was biopsied.  There was a healthy end to side ileocolonic anastomosis which was widely patent.  The cecum was spared and appeared to be intact.  The colonic mucosa was normal with no polyps.  Diverticulosis was found in the ascending, descending and sigmoid colon.  There were small internal hemorrhoids found on retroflexion.  Biopsies from the terminal ileum showed chronic severely active ileitis with ulceration.  He reports overall he is doing well but he does deal with chronic diarrhea and loose stools 6-8 times per day.  He can develop right lower quadrant pain if he eats certain trigger foods such as [place] food, any nuts and oatmeal.  He tries to avoid these foods.  He very rarely has any issues with abdominal pain.  He is using MiraLAX daily because he developed constipation from his stricture before surgery.  He has been on MiraLAX since surgery.  No blood in his stool or melena  He notes that he took Pentasa 1 g 4 times daily before surgery but no therapy since.   Review of Systems As per HPI, otherwise negative  Current Medications, Allergies, Past Medical History, Past Surgical History, Family History and Social History were reviewed in [employer] record.     Objective:   Physical Exam BP 140/90   Pulse 64   Ht 6' 2"  (1.88 m)   Wt 244 lb (110.7 kg)   BMI 31.33 kg/m  Constitutional: Well-developed and well-nourished. No distress. HEENT: Normocephalic and atraumatic. Conjunctivae are normal.  No scleral icterus. Neck: Neck supple. Trachea midline. Cardiovascular: Normal rate, regular rhythm and intact distal pulses.  Pulmonary/chest: Effort normal and breath sounds normal. No wheezing, rales or rhonchi. Abdominal: Soft, nontender, nondistended. Bowel sounds active throughout.  Nontender umbilical hernia.  No hepatosplenomegaly. Extremities: no clubbing, cyanosis, or edema Neurological: Alert and oriented to person place and time. Skin: Skin is warm and dry. Psychiatric: Normal mood and affect. Behavior is normal.  CBC    Component Value Date/Time   WBC 8.0 06/05/2017 1544   RBC 5.16 06/05/2017 1544   HGB 15.8 06/05/2017 1544   HCT 46.2 06/05/2017 1544   PLT 415.0 (H) 06/05/2017 1544   MCV 89.4 06/05/2017 1544   MCH 24.6 (L) 09/09/2013 0040   MCHC 34.2 06/05/2017 1544   RDW 14.6 06/05/2017 1544   LYMPHSABS 2.3 06/05/2017 1544   MONOABS 1.0 06/05/2017 1544   EOSABS 0.2 06/05/2017 1544   BASOSABS 0.0 06/05/2017 1544   CMP     Component Value Date/Time   NA 138 06/05/2017 1544   K 4.0 06/05/2017 1544   CL 103 06/05/2017 1544   CO2 30 06/05/2017 1544   GLUCOSE 89 06/05/2017 1544   BUN 12 06/05/2017 1544  CREATININE 1.09 06/05/2017 1544   CREATININE 1.11 12/07/2013 1533   CALCIUM 9.4 06/05/2017 1544   PROT 7.3 06/05/2017 1544   ALBUMIN 3.7 06/05/2017 1544   AST 22 06/05/2017 1544   ALT 15 06/05/2017 1544   ALKPHOS 91 06/05/2017 1544   BILITOT 0.6 06/05/2017 1544   GFRNONAA 70 (L) 09/07/2013 1850   GFRAA 81 (L) 09/07/2013 1850       Assessment & Plan:  67 year old male with a history of ileal Crohn's disease diagnosed as ileitis in 2009 with ileal stricture status post ileocecectomy in June 2015 who is here for follow-up.  1.   Active Crohn's ileitis/history of ileocecectomy for stricture in 2015 --we had a discussion today regarding active Crohn's disease which is recurrent for him.  He does have intermittent right lower quadrant pain which I feel is directly related to the inflammation we sought colonoscopy.  He is also putting up with frequent loose stools and diarrhea which I think is Crohn's related but also MiraLAX related.  Given his history with constipation and stricture with partial obstruction he was using the MiraLAX and basically putting up with diarrhea because he did not want to redevelop partial obstruction.  His anastomosis was widely patent and no evidence of stricture so recurrent partial obstruction at this point is felt unlikely though would be a risk for him if we do not treat. --I would like to put him back on Pentasa 1 g twice daily initially along with Entocort 9 mg daily times 8 weeks.  I would then like to see him back in June 2019 for assessment to see how he responds to therapy.  We also briefly discussed escalation of therapy with a biologic medication but we did not talk about the specific risks versus benefits today.  We will do this on follow-up based on how he responds to this therapy. --Also stop MiraLAX with his ongoing loose stools and diarrhea  2.  Family history of colon cancer --surveillance colonoscopy every 5 years is recommended, next exam would be recommended January 2024  3.  History of IDA --ferritin was elevated after last visit and so I recommended he stop oral iron.  CBC remains normal today.  Monitor for recurrent IDA routinely given ileitis  25 minutes spent with the patient today. Greater than 50% was spent in counseling and coordination of care with the patient

## 2017-06-27 ENCOUNTER — Ambulatory Visit (INDEPENDENT_AMBULATORY_CARE_PROVIDER_SITE_OTHER): Payer: Medicare Other | Admitting: Family Medicine

## 2017-06-27 ENCOUNTER — Other Ambulatory Visit: Payer: Self-pay | Admitting: Internal Medicine

## 2017-06-27 ENCOUNTER — Encounter: Payer: Self-pay | Admitting: Family Medicine

## 2017-06-27 VITALS — BP 128/86 | HR 88 | Temp 98.3°F | Ht 74.0 in | Wt 242.0 lb

## 2017-06-27 DIAGNOSIS — K50012 Crohn's disease of small intestine with intestinal obstruction: Secondary | ICD-10-CM

## 2017-06-27 DIAGNOSIS — R490 Dysphonia: Secondary | ICD-10-CM

## 2017-06-27 DIAGNOSIS — R351 Nocturia: Secondary | ICD-10-CM

## 2017-06-27 DIAGNOSIS — I10 Essential (primary) hypertension: Secondary | ICD-10-CM | POA: Diagnosis not present

## 2017-06-27 DIAGNOSIS — E039 Hypothyroidism, unspecified: Secondary | ICD-10-CM

## 2017-06-27 DIAGNOSIS — E118 Type 2 diabetes mellitus with unspecified complications: Secondary | ICD-10-CM

## 2017-06-27 DIAGNOSIS — N401 Enlarged prostate with lower urinary tract symptoms: Secondary | ICD-10-CM | POA: Diagnosis not present

## 2017-06-27 DIAGNOSIS — Z23 Encounter for immunization: Secondary | ICD-10-CM | POA: Diagnosis not present

## 2017-06-27 LAB — COMPREHENSIVE METABOLIC PANEL WITH GFR
ALT: 29 U/L (ref 0–53)
AST: 23 U/L (ref 0–37)
Albumin: 3.6 g/dL (ref 3.5–5.2)
Alkaline Phosphatase: 79 U/L (ref 39–117)
BUN: 11 mg/dL (ref 6–23)
CO2: 31 meq/L (ref 19–32)
Calcium: 9.2 mg/dL (ref 8.4–10.5)
Chloride: 101 meq/L (ref 96–112)
Creatinine, Ser: 1.13 mg/dL (ref 0.40–1.50)
GFR: 68.57 mL/min
Glucose, Bld: 118 mg/dL — ABNORMAL HIGH (ref 70–99)
Potassium: 2.9 meq/L — ABNORMAL LOW (ref 3.5–5.1)
Sodium: 140 meq/L (ref 135–145)
Total Bilirubin: 0.5 mg/dL (ref 0.2–1.2)
Total Protein: 6.7 g/dL (ref 6.0–8.3)

## 2017-06-27 LAB — TSH: TSH: 9.78 u[IU]/mL — ABNORMAL HIGH (ref 0.35–4.50)

## 2017-06-27 LAB — MICROALBUMIN / CREATININE URINE RATIO
Creatinine,U: 148.1 mg/dL
Microalb Creat Ratio: 41.2 mg/g — ABNORMAL HIGH (ref 0.0–30.0)
Microalb, Ur: 61.1 mg/dL — ABNORMAL HIGH (ref 0.0–1.9)

## 2017-06-27 LAB — HEMOGLOBIN A1C: Hgb A1c MFr Bld: 6.1 % (ref 4.6–6.5)

## 2017-06-27 MED ORDER — OMEPRAZOLE 20 MG PO CPDR: 20.0000 mg | DELAYED_RELEASE_CAPSULE | Freq: Every day | ORAL | 3 refills | Status: DC

## 2017-06-27 MED ORDER — BUDESONIDE 3 MG PO CPEP: 9.0000 mg | ORAL_CAPSULE | Freq: Every day | ORAL | 1 refills | Status: DC

## 2017-06-27 NOTE — Assessment & Plan Note (Signed)
S: Has been well controlled.On thyroid medication-levothyroxine 175 mcg Lab Results  Component Value Date   TSH 2.89 03/07/2017  A/P: update tsh, continue current meds

## 2017-06-27 NOTE — Progress Notes (Signed)
Please let us know by responding to this when you get this mychart message and let us know if you understand the results/directions  Patient is leaking a slight amount of protein into his urine from his diabetes.  His potassium is also now slightly low.  Both of these issues could be addressed by low-dose angiotensin receptor blocker.  We recently took him off of telmisartan.  Let us try a very low-dose losartan at 25 mg #90 with 3 refills if his pharmacy carries this and it is not on recall list. Outside of potassium, Your CMET was normal (kidney, liver, and electrolytes, blood sugar) for  someone with diabetes  Your thyroid was slightly off-are you taking her medication every single day?  If you are already taking the 175 mcg of levothyroxine every single day.  Please increase his dose to 200 mcg with a 90-day supply and 1 refill and set him up for a 6-week follow-up TSH under hypothyroidism

## 2017-06-27 NOTE — Assessment & Plan Note (Signed)
S: Controlled. On no medications.  Weight stable from January 3 visit  Lab Results  Component Value Date   HGBA1C 6.3 03/07/2017   HGBA1C 5.9 10/18/2016   HGBA1C 6.7 (H) 06/21/2016   A/P: will update a1c today  -Calling to get eye exam records.  Will do foot exam today

## 2017-06-27 NOTE — Assessment & Plan Note (Signed)
s: controlled on Micardis 20 mg in past- now off and BP still looks good BP Readings from Last 3 Encounters:  06/27/17 128/86  06/05/17 140/90  03/29/17 90/66  A/P: We discussed blood pressure goal of <1 40/90. Continue current meds

## 2017-06-27 NOTE — Patient Instructions (Addendum)
Health Maintenance Due  Topic Date Due  . URINE MICROALBUMIN - Today at office Visit 05/26/1959  . PNA vac Low Risk Adult (2 of 2 - PPSV23) -Today at Office Visit 04/03/2017  . OPHTHALMOLOGY EXAM - Patient had it done back in March. Our team will call for the records. 05/10/2017  . FOOT EXAM - today at office visit 06/21/2017    Try prilosec until you see Dr. Hilarie Fredrickson to see if that helps with the hoarseness. If it doesn't improve please let him know  Please stop by lab before you go

## 2017-06-27 NOTE — Addendum Note (Signed)
Addended by: Everrett Coombe on: 06/27/2017 02:22 PM   Modules accepted: Orders

## 2017-06-27 NOTE — Assessment & Plan Note (Signed)
Daughter Bruce [name] was to help him follow-up with urology for elevated PSA.  Dr. Diona Fanti encouraged once a year follow-up.  PSA is in 6-7 range.  Apparently he has had biopsies twice in the past dating back to 2011.  He did not recommend biopsy at this time.

## 2017-06-27 NOTE — Assessment & Plan Note (Signed)
Some hoarseness- could be GERD related. Long term smoker- asked him to discuss with Dr. Hilarie Fredrickson- wonder about need for EGD- will trial prilosec 75m

## 2017-06-27 NOTE — Progress Notes (Signed)
Subjective:  Bruce [name] is a 67 y.o. year old very pleasant male patient who presents for/with See problem oriented charting ROS- hard of hearing.  No chest pain or shortness of breath.  No headache or blurry vision.  Past Medical History-  Patient Active Problem List   Diagnosis Date Noted  . Type II diabetes mellitus with manifestations ([place]) 08/17/2013    Priority: High  . Crohn's ileitis ([place]) 03/17/2012    Priority: High  . Osteoarthritis of left knee 06/21/2016    Priority: Medium  . Former smoker 12/02/2015    Priority: Medium  . Iron deficiency anemia 09/03/2013    Priority: Medium  . B12 deficiency anemia 09/03/2013    Priority: Medium  . HTN (hypertension) 03/17/2012    Priority: Medium  . Sleep apnea 03/17/2012    Priority: Medium  . Hypothyroidism 03/17/2012    Priority: Medium  . BPH associated with nocturia 03/17/2012    Priority: Medium  . Aortic atherosclerosis ([place]) 03/30/2017    Priority: Low  . 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 Family history of colon cancer 01/26/2014    Priority: Low  . Constipation 05/20/2012    Priority: Low  . Obesity 03/17/2012    Priority: Low  . Hoarseness 06/27/2017  . Onychomycosis 06/21/2016    Medications- reviewed and updated Current Outpatient Medications  Medication Sig Dispense Refill  . cyanocobalamin 2000 MCG tablet Take 2,000 mcg by mouth daily.    . polyethylene glycol (MIRALAX / GLYCOLAX) packet Take 17 g by mouth daily.    Marland Kitchen acetaminophen (TYLENOL) 500 MG tablet Take 1,000 mg by mouth every 8 (eight) hours as needed for moderate pain.    Marland Kitchen aspirin 81 MG tablet Take 81 mg by mouth daily.      . budesonide (ENTOCORT EC) 3 MG 24 hr capsule Take 3 capsules (9 mg total) by mouth daily. X 8 weeks 90 capsule 1  . levothyroxine (SYNTHROID, LEVOTHROID) 175 MCG tablet Take 1 tablet (175 mcg total) by mouth daily. 90 tablet 3  . mesalamine (PENTASA) 500 MG CR capsule Take 1  capsule (500 mg total) by mouth 2 (two) times daily. 60 capsule 2   Objective: BP 128/86 (BP Location: Left Arm, Patient Position: Sitting, Cuff Size: Normal)   Pulse 88   Temp 98.3 F (36.8 C) (Oral)   Ht 6' 2"  (1.88 m)   Wt 242 lb (109.8 kg)   SpO2 95%   BMI 31.07 kg/m  Gen: NAD, resting comfortably CV: RRR no murmurs rubs or gallops Lungs: CTAB no crackles, wheeze, rhonchi Abdomen: soft/nontender/nondistended/normal bowel sounds.  Overweight Ext: no edema Skin: warm, dry Neuro: Very hard of hearing.  Loud speech  Diabetic Foot Exam - Simple   Simple Foot Form Diabetic Foot exam was performed with the following findings:  Yes 06/27/2017  2:13 PM  Visual Inspection No deformities, no ulcerations, no other skin breakdown bilaterally:  Yes Sensation Testing Intact to touch and monofilament testing bilaterally:  Yes Pulse Check Posterior Tibialis and Dorsalis pulse intact bilaterally:  Yes Comments Onychomycosis noted.  No toenail on 1 of great toes    Assessment/Plan:  Other notes 1.  Received knee injection in August 2018  Crohn's ileitis [facility]) S: Patient had follow-up colonoscopy with GI earlier this year.  He has a history of Crohn's ileitis. He was started back on pentasa 1 g BID and entocort 13m daily for 8 weeks to try to calm  things down- may need biologic. Was to stop miralax A/P: symptoms improving slightly- abdominal pain and constipation both better with this change. He has follow up planned in june  Type II diabetes mellitus with manifestations ([place]) S: Controlled. On no medications.  Weight stable from January 3 visit  Lab Results  Component Value Date   HGBA1C 6.3 03/07/2017   HGBA1C 5.9 10/18/2016   HGBA1C 6.7 (H) 06/21/2016   A/P: will update a1c today  -Calling to get eye exam records.  Will do foot exam today  Hypothyroidism S: Has been well controlled.On thyroid medication-levothyroxine 175 mcg Lab Results  Component Value Date   TSH 2.89  03/07/2017  A/P: update tsh, continue current meds  Hoarseness Some hoarseness- could be GERD related. Long term smoker- asked him to discuss with Dr. Hilarie Fredrickson- wonder about need for EGD- will trial prilosec 17m  HTN (hypertension) s: controlled on Micardis 20 mg in past- now off and BP still looks good BP Readings from Last 3 Encounters:  06/27/17 128/86  06/05/17 140/90  03/29/17 90/66  A/P: We discussed blood pressure goal of <1 40/90. Continue current meds   BPH associated with nocturia Daughter Adria was to help him follow-up with urology for elevated PSA.  Dr. DDiona Fantiencouraged once a year follow-up.  PSA is in 6-7 range.  Apparently he has had biopsies twice in the past dating back to 2011.  He did not recommend biopsy at this time.   Future Appointments  Date Time Provider [place] 08/20/2017  1:45 PM Pyrtle, JLajuan Lines MD [facility]   Return in about 4 months (around 11/07/2017) for physical, come fasting.  Lab/Order associations: Type 2 diabetes mellitus with complication, without long-term current use of insulin ([place] - Plan: Microalbumin / creatinine urine ratio, Comprehensive metabolic panel, Hemoglobin A1c  Hypothyroidism, unspecified type - Plan: TSH  Meds ordered this encounter  Medications  . omeprazole (PRILOSEC) 20 MG capsule    Sig: Take 1 capsule (20 mg total) by mouth daily.    Dispense:  30 capsule    Refill:  3    Return precautions advised.  SGarret Reddish MD

## 2017-06-27 NOTE — Assessment & Plan Note (Signed)
S: Patient had follow-up colonoscopy with GI earlier this year.  He has a history of Crohn's ileitis. He was started back on pentasa 1 g BID and entocort 67m daily for 8 weeks to try to calm things down- may need biologic. Was to stop miralax A/P: symptoms improving slightly- abdominal pain and constipation both better with this change. He has follow up planned in june

## 2017-06-28 ENCOUNTER — Telehealth: Payer: Self-pay

## 2017-06-28 NOTE — Telephone Encounter (Signed)
Called patient and left a message to call office back.

## 2017-07-02 ENCOUNTER — Telehealth: Payer: Self-pay | Admitting: Family Medicine

## 2017-07-02 NOTE — Telephone Encounter (Signed)
Patient's daughter [name] called, left VM to return call to the office for lab results due to it's too much information to leave on answering machine. Results are listed in result notes.

## 2017-07-02 NOTE — Telephone Encounter (Signed)
Copied from [place] #[id number]. Topic: Quick Communication - Lab Results >> Jun 28, 2017  3:25 PM Dark, Angelena Sole, CMA wrote: Called patient to inform them of 06/27/2017 lab results. When patient returns call, triage nurse may disclose results.   Patient's daughter [name] called in to get the lab results and to also figure out what the medications that were called in are for. Patient received call from pharmacy stating medications were ready for pick up but they are confused as to what the medications are for. Adria stated a detailed voicemail can be left if she does not answer. [phone]

## 2017-07-03 ENCOUNTER — Other Ambulatory Visit: Payer: Self-pay | Admitting: *Deleted

## 2017-07-03 MED ORDER — LOSARTAN POTASSIUM 25 MG PO TABS: 25.0000 mg | ORAL_TABLET | Freq: Every day | ORAL | 3 refills | Status: DC

## 2017-07-03 NOTE — Telephone Encounter (Signed)
See note

## 2017-07-03 NOTE — Telephone Encounter (Signed)
Attempted to call daughter, left voicemail requesting call back. Prescription has been sent in to pharmacy. Please see result note from Dr Yong Channel.

## 2017-07-03 NOTE — Telephone Encounter (Signed)
Patient' son, Rahshawn, calling to inquire about the potassium that needs to be called in as well as the dosage change in some of the patient's medications. He says he needs a call back as soon as possible to avoid taking the patient to the ER.

## 2017-07-04 ENCOUNTER — Other Ambulatory Visit: Payer: Self-pay

## 2017-07-04 ENCOUNTER — Telehealth: Payer: Self-pay

## 2017-07-04 DIAGNOSIS — E039 Hypothyroidism, unspecified: Secondary | ICD-10-CM

## 2017-07-04 MED ORDER — POTASSIUM CHLORIDE 20 MEQ PO PACK: 20.0000 meq | PACK | Freq: Every day | ORAL | 0 refills | Status: DC

## 2017-07-04 MED ORDER — LEVOTHYROXINE SODIUM 200 MCG PO TABS: 200.0000 ug | ORAL_TABLET | Freq: Every day | ORAL | 3 refills | Status: DC

## 2017-07-04 NOTE — Telephone Encounter (Signed)
Called patient and his son stated "what is this about, I have [place] Cerise on the other line." I told him to click back over and talk to [place], I didn't know Roselyn Reef had called him.

## 2017-07-04 NOTE — Telephone Encounter (Signed)
Pt son Jefferson is calling back upset he states that he had asked someone to call him not his sister about a RX for potassium please call [phone]

## 2017-07-04 NOTE — Telephone Encounter (Signed)
Called patient and his son stated "what is this about, I have Bruce [name] on the other line." I told him to click back over and talk to [place], I didn't know Roselyn Reef had called him.

## 2017-07-16 ENCOUNTER — Ambulatory Visit: Payer: Medicare Other | Admitting: Family Medicine

## 2017-07-16 ENCOUNTER — Encounter: Payer: Self-pay | Admitting: Family Medicine

## 2017-07-16 VITALS — BP 132/88 | HR 99 | Temp 98.3°F | Ht 74.0 in | Wt 244.4 lb

## 2017-07-16 DIAGNOSIS — E039 Hypothyroidism, unspecified: Secondary | ICD-10-CM | POA: Diagnosis not present

## 2017-07-16 DIAGNOSIS — I10 Essential (primary) hypertension: Secondary | ICD-10-CM

## 2017-07-16 NOTE — Progress Notes (Signed)
Subjective:  Bruce [name] is a 67 y.o. year old very pleasant male patient who presents for/with See problem oriented charting ROS-mild right eye.  No chest pain or edema reported.  Some mild fatigue.  Past Medical History-  Patient Active Problem List   Diagnosis Date Noted  . Type II diabetes mellitus with manifestations ([name]) 08/17/2013    Priority: High  . Crohn's ileitis ([name]) 03/17/2012    Priority: High  . Osteoarthritis of left knee 06/21/2016    Priority: Medium  . Former smoker 12/02/2015    Priority: Medium  . Iron deficiency anemia 09/03/2013    Priority: Medium  . B12 deficiency anemia 09/03/2013    Priority: Medium  . HTN (hypertension) 03/17/2012    Priority: Medium  . Sleep apnea 03/17/2012    Priority: Medium  . Hypothyroidism 03/17/2012    Priority: Medium  . BPH associated with nocturia 03/17/2012    Priority: Medium  . Aortic atherosclerosis ([name]) 03/30/2017    Priority: Low  . 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 Family history of colon cancer 01/26/2014    Priority: Low  . Constipation 05/20/2012    Priority: Low  . Obesity 03/17/2012    Priority: Low  . Hoarseness 06/27/2017  . Onychomycosis 06/21/2016    Medications- reviewed and updated Current Outpatient Medications  Medication Sig Dispense Refill  . acetaminophen (TYLENOL) 500 MG tablet Take 1,000 mg by mouth every 8 (eight) hours as needed for moderate pain.    Marland Kitchen aspirin 81 MG tablet Take 81 mg by mouth daily.      . budesonide (ENTOCORT EC) 3 MG 24 hr capsule Take 3 capsules (9 mg total) by mouth daily. X 8 weeks 90 capsule 1  . cyanocobalamin 2000 MCG tablet Take 2,000 mcg by mouth daily.    Marland Kitchen levothyroxine (SYNTHROID, LEVOTHROID) 200 MCG tablet Take 1 tablet (200 mcg total) by mouth daily before breakfast. 90 tablet 3  . losartan (COZAAR) 25 MG tablet Take 1 tablet (25 mg total) by mouth daily. 90 tablet 3  . mesalamine (PENTASA) 500 MG CR  capsule Take 1 capsule (500 mg total) by mouth 2 (two) times daily. 60 capsule 2  . omeprazole (PRILOSEC) 20 MG capsule Take 1 capsule (20 mg total) by mouth daily. 30 capsule 3  . polyethylene glycol (MIRALAX / GLYCOLAX) packet Take 17 g by mouth daily.    . potassium chloride (KLOR-CON) 20 MEQ packet Take 20 mEq by mouth daily. 7 packet 0   No current facility-administered medications for this visit.    Objective: BP 132/88   Pulse 99   Temp 98.3 F (36.8 C) (Oral)   Ht 6' 2"  (1.88 m)   Wt 244 lb 6.4 oz (110.9 kg)   SpO2 95%   BMI 31.38 kg/m  Gen: NAD, resting comfortably Mild erythema and sclera on right eye. CV: RRR no murmurs rubs or gallops Lungs: CTAB no crackles, wheeze, rhonchi Abdomen: soft/nontender/nondistended/obese Ext: no edema Skin: warm, dry  Assessment/Plan:  Having to use a window air conditioning unit blowing into eye at night- mild erythema.  They are getting her conditioning overall fixed today-so will not have to use window unit.  They agreed to follow-up with ophthalmology if symptoms worsen  HTN (hypertension) S: controlled in past on no medication. Trending up now. Had planned to start losartan but need to know what potassium looks like first. He took potassium 20 meq for 7 day- and  finished a few days ago BP Readings from Last 3 Encounters:  07/16/17 132/88  06/27/17 128/86  06/05/17 140/90  A/P: We discussed blood pressure goal of <140/90.  We will start losartan 25 mg now since potassium now in normal range.  Hypothyroidism S: On thyroid medication-Levothyroxine 175 mcg--> 200 mcg . Reports some mild fatigue Lab Results  Component Value Date   TSH 0.61 07/16/2017   A/P: Has only been 3 to 4 weeks though.  Could recheck with next blood work   Future Appointments  Date Time Provider [name]  08/05/2017  2:30 PM [name]  10/03/2017  3:30 PM Pyrtle, Lajuan Lines, MD [name]  10/28/2017  1:15 PM Yong Channel Brayton Mars, MD  [name]   Lab/Order associations: Essential hypertension - Plan: Basic metabolic panel  Hypothyroidism, unspecified type - Plan: TSH   Return precautions advised.  Garret Reddish, MD

## 2017-07-16 NOTE — Patient Instructions (Addendum)
Health Maintenance Due  Topic Date Due  . OPHTHALMOLOGY EXAM - Our team will call and request records from Dr. Truman Hayward office at [facility] 05/10/2017   We will reach out to you once we have labs back to decide on thyroid medicine and starting losartan

## 2017-07-17 LAB — BASIC METABOLIC PANEL
BUN: 12 mg/dL (ref 6–23)
CO2: 26 mEq/L (ref 19–32)
Calcium: 9.3 mg/dL (ref 8.4–10.5)
Chloride: 104 mEq/L (ref 96–112)
Creatinine, Ser: 1.27 mg/dL (ref 0.40–1.50)
GFR: 59.92 mL/min — ABNORMAL LOW (ref >60.00)
Glucose, Bld: 128 mg/dL — ABNORMAL HIGH (ref 70–99)
Potassium: 4 mEq/L (ref 3.5–5.1)
Sodium: 139 mEq/L (ref 135–145)

## 2017-07-17 LAB — TSH: TSH: 0.61 u[IU]/mL (ref 0.35–4.50)

## 2017-07-17 NOTE — Progress Notes (Signed)
Potassium now normal- very very slight decrease in kidney function which is acceptable with the start of the new medication. Thyroid now normal- we may need to recheck next visit as well.

## 2017-07-18 NOTE — Assessment & Plan Note (Signed)
S: controlled in past on no medication. Trending up now. Had planned to start losartan but need to know what potassium looks like first. He took potassium 20 meq for 7 day- and finished a few days ago BP Readings from Last 3 Encounters:  07/16/17 132/88  06/27/17 128/86  06/05/17 140/90  A/P: We discussed blood pressure goal of <140/90.  We will start losartan 25 mg now since potassium now in normal range.

## 2017-07-18 NOTE — Assessment & Plan Note (Addendum)
S: On thyroid medication-Levothyroxine 175 mcg--> 200 mcg . Reports some mild fatigue Lab Results  Component Value Date   TSH 0.61 07/16/2017   A/P: Has only been 3 to 4 weeks though.  Could recheck with next blood work

## 2017-07-23 ENCOUNTER — Other Ambulatory Visit: Payer: Self-pay | Admitting: Internal Medicine

## 2017-07-26 ENCOUNTER — Encounter: Payer: Self-pay | Admitting: Family Medicine

## 2017-08-05 ENCOUNTER — Other Ambulatory Visit (INDEPENDENT_AMBULATORY_CARE_PROVIDER_SITE_OTHER): Payer: Medicare Other

## 2017-08-05 DIAGNOSIS — E039 Hypothyroidism, unspecified: Secondary | ICD-10-CM

## 2017-08-05 LAB — TSH: TSH: 0.38 u[IU]/mL (ref 0.35–4.50)

## 2017-08-20 ENCOUNTER — Ambulatory Visit: Payer: Medicare Other | Admitting: Internal Medicine

## 2017-09-09 ENCOUNTER — Ambulatory Visit (INDEPENDENT_AMBULATORY_CARE_PROVIDER_SITE_OTHER): Payer: Medicare Other | Admitting: Family Medicine

## 2017-09-09 ENCOUNTER — Encounter: Payer: Self-pay | Admitting: Family Medicine

## 2017-09-09 VITALS — BP 130/84 | HR 93 | Temp 98.0°F | Ht 74.0 in | Wt 239.8 lb

## 2017-09-09 DIAGNOSIS — L03313 Cellulitis of chest wall: Secondary | ICD-10-CM

## 2017-09-09 MED ORDER — DOXYCYCLINE HYCLATE 100 MG PO TABS
100.0000 mg | ORAL_TABLET | Freq: Two times a day (BID) | ORAL | 0 refills | Status: DC
Start: 2017-09-09 — End: 2017-10-03

## 2017-09-09 NOTE — Patient Instructions (Signed)
It was very nice to see you today!  I think you have a small skin infection from your bite. Please start the doxycycline 187m twice daily.  You can also use over the counter hydrocortisone cream to the area to help with the itching.   Please let me or Dr HYong Channelknow if your symptoms worsen or do not improve over the next few days.   Take care, Dr PJerline Pain

## 2017-09-09 NOTE — Progress Notes (Signed)
   Subjective:  Bruce [name] is a 67 y.o. male who presents today for same-day appointment with a chief complaint of rash.   HPI:  Rash, acute problem Started 2 days ago. Worsened over the past few days. Rash is painful and pruritic. Tried neosporin which helps some.  Patient thinks he was bit by an insect which cause a rash.  Rash is located on the superior aspect of his chest wall.  No reported fevers or chills.  No other obvious alleviating or aggravating factors.  ROS: Per HPI  PMH: He reports that he quit smoking about 11 years ago. His smoking use included cigarettes. He has a 160.00 pack-year smoking history. He has never used smokeless tobacco. He reports that he does not drink alcohol or use drugs.  Objective:  Physical Exam: BP 130/84 (BP Location: Left Arm, Patient Position: Sitting, Cuff Size: Normal)   Pulse 93   Temp 98 F (36.7 C) (Oral)   Ht 6' 2"  (1.88 m)   Wt 239 lb 12.8 oz (108.8 kg)   SpO2 94%   BMI 30.79 kg/m   Gen: NAD, resting comfortably Skin: Approximately 10 x 12 cm area of erythema on superior chest wall with central raised, excoriated lesion.  Area is warm to touch.  Tender to palpation.  Assessment/Plan:  Rash/cellulitis Rash consistent with cellulitis.  We will start doxycycline 100 mg twice daily for 1 week.  No signs of systemic illness.  Recommended use of over-the-counter hydrocortisone cream as needed for pruritus.  Discussed reasons to return to care.  Follow-up as needed.  Algis Greenhouse. Jerline Pain, MD 09/09/2017 3:51 PM

## 2017-10-03 ENCOUNTER — Ambulatory Visit: Payer: Medicare Other | Admitting: Internal Medicine

## 2017-10-03 ENCOUNTER — Encounter: Payer: Self-pay | Admitting: Internal Medicine

## 2017-10-03 VITALS — BP 118/70 | HR 82 | Ht 72.0 in | Wt 238.0 lb

## 2017-10-03 DIAGNOSIS — Z862 Personal history of diseases of the blood and blood-forming organs and certain disorders involving the immune mechanism: Secondary | ICD-10-CM | POA: Diagnosis not present

## 2017-10-03 DIAGNOSIS — K5000 Crohn's disease of small intestine without complications: Secondary | ICD-10-CM | POA: Diagnosis not present

## 2017-10-03 DIAGNOSIS — Z800 Family history of malignant neoplasm of digestive organs: Secondary | ICD-10-CM

## 2017-10-03 NOTE — Progress Notes (Signed)
Subjective:    Patient ID: Bruce [name], male    DOB: 1950-11-07, 67 y.o.   MRN: [id number]  HPI Bruce [name] is a 67 year old male with a past medical history of ileal Crohn's disease diagnosed as ileitis in 2009 with ileal stricture status post ileocecectomy in June 2015 who is here for follow-up.  He was last seen on 06/05/2017 after having undergone a screening colonoscopy in January 2019.  He is here today with his daughter.  The above-mentioned colonoscopy revealed mild to moderate ileitis with erosions, erythema and ulcerations in the neoterminal ileum.  By biopsy this showed severely active chronic active ileitis consistent with Crohn's disease.  At the time of his last visit he was having intermittent right sided abdominal pain but also loose stools as much as 6-8 times per day.  We made the decision to give him an 8-week course of budesonide 9 mg daily and restart Pentasa at 1 g twice daily.  He had previously taken Pentasa before his ileocecectomy in 2015.  He states that he noticed considerable improvement in his bowel symptoms with budesonide and Pentasa therapy.  He reports is been able to eat a more liberal diet and has had no issues with right sided or right lower abdominal pain.  He also has had more formed stool.  He went on vacation about 2 weeks ago and ran out of Pentasa.  He has been off Pentasa over 2 weeks and is noticed that stools have become slightly more formed since Pentasa has stopped.  Again, all better with budesonide and Pentasa but in the.  When on Pentasa only it seems like his stools were slightly looser than now being off everything.  He also stopped MiraLAX at last visit.   Review of Systems As per HPI, otherwise negative  Current Medications, Allergies, Past Medical History, Past Surgical History, Family History and Social History were reviewed in [employer] record.     Objective:   Physical Exam BP 118/70   Pulse 82   Ht  6' (1.829 m)   Wt 238 lb (108 kg)   BMI 32.28 kg/m  Constitutional: Well-developed and well-nourished. No distress. HEENT: Normocephalic and atraumatic. Conjunctivae are normal.  No scleral icterus. Neck: Neck supple. Trachea midline. Cardiovascular: Normal rate, regular rhythm and intact distal pulses. No M/R/G Pulmonary/chest: Effort normal and breath sounds normal. No wheezing, rales or rhonchi. Abdominal: Soft, mild tenderness in the right middle to lower quadrant, nondistended. Bowel sounds active throughout. There are no masses palpable Extremities: no clubbing, cyanosis, or edema Neurological: Alert and oriented to person place and time. Skin: Skin is warm and dry. Psychiatric: Normal mood and affect. Behavior is normal.      Assessment & Plan:  67 year old male with a past medical history of ileal Crohn's disease diagnosed as ileitis in 2009 with ileal stricture status post ileocecectomy in June 2015 who is here for follow-up  1.  Ileal Crohn's with history of stricture/status post ileocecectomy --he has improved clinically after budesonide and Pentasa therapy.  Interesting to me that his stools are slightly more formed to being off Pentasa.  My suspicion is that budesonide improved ileitis, perhaps induced remission.  At this point given overall improvement I will leave him off of Pentasa and not repeat a budesonide course.  We discussed his symptoms today and should he have recurrent loose stools or right-sided abdominal pain I would first recommend we resume Pentasa 1 g twice daily.  If this  was not enough to get symptoms under complete control then I would repeat a budesonide course 9 mg x 8 weeks.  He is happy with this plan.  He should remain off MiraLAX  2.  Family history of colon cancer --surveillance colonoscopy recommended January 2024  3.  History of iron deficiency anemia --iron supplementation was stopped earlier this year when ferritin was elevated.  I recommend we  repeat CBC and iron studies at next visit  25 minutes spent with the patient today. Greater than 50% was spent in counseling and coordination of care with the patient  61-monthfollow-up

## 2017-10-03 NOTE — Patient Instructions (Signed)
Stay off Pentasa.  Call if you develop any right lower quadrant abdominal pain or loose stools.  Please follow up with Dr Hilarie Fredrickson in 4 months.  If you are age 68 or older, your body mass index should be between 23-30. Your Body mass index is 32.28 kg/m. If this is out of the aforementioned range listed, please consider follow up with your Primary Care Provider.  If you are age 23 or younger, your body mass index should be between 19-25. Your Body mass index is 32.28 kg/m. If this is out of the aformentioned range listed, please consider follow up with your Primary Care Provider.

## 2017-10-28 ENCOUNTER — Ambulatory Visit (INDEPENDENT_AMBULATORY_CARE_PROVIDER_SITE_OTHER): Payer: Medicare Other | Admitting: Family Medicine

## 2017-10-28 ENCOUNTER — Encounter: Payer: Self-pay | Admitting: Family Medicine

## 2017-10-28 VITALS — BP 120/78 | HR 98 | Temp 98.1°F | Ht 72.0 in | Wt 243.0 lb

## 2017-10-28 DIAGNOSIS — E118 Type 2 diabetes mellitus with unspecified complications: Secondary | ICD-10-CM

## 2017-10-28 DIAGNOSIS — E039 Hypothyroidism, unspecified: Secondary | ICD-10-CM | POA: Diagnosis not present

## 2017-10-28 DIAGNOSIS — I10 Essential (primary) hypertension: Secondary | ICD-10-CM | POA: Diagnosis not present

## 2017-10-28 DIAGNOSIS — R5383 Other fatigue: Secondary | ICD-10-CM

## 2017-10-28 DIAGNOSIS — G4730 Sleep apnea, unspecified: Secondary | ICD-10-CM

## 2017-10-28 LAB — CBC
HCT: 47.4 % (ref 39.0–52.0)
Hemoglobin: 16 g/dL (ref 13.0–17.0)
MCHC: 33.7 g/dL (ref 30.0–36.0)
MCV: 88.4 fl (ref 78.0–100.0)
Platelets: 431 10*3/uL — ABNORMAL HIGH (ref 150.0–400.0)
RBC: 5.36 Mil/uL (ref 4.22–5.81)
RDW: 14.8 % (ref 11.5–15.5)
WBC: 8 10*3/uL (ref 4.0–10.5)

## 2017-10-28 LAB — HEMOGLOBIN A1C: Hgb A1c MFr Bld: 6 % (ref 4.6–6.5)

## 2017-10-28 LAB — BASIC METABOLIC PANEL
BUN: 15 mg/dL (ref 6–23)
CO2: 23 mEq/L (ref 19–32)
Calcium: 9.5 mg/dL (ref 8.4–10.5)
Chloride: 105 mEq/L (ref 96–112)
Creatinine, Ser: 1.21 mg/dL (ref 0.40–1.50)
GFR: 63.3 mL/min (ref >60.00)
Glucose, Bld: 99 mg/dL (ref 70–99)
Potassium: 3.7 mEq/L (ref 3.5–5.1)
Sodium: 138 mEq/L (ref 135–145)

## 2017-10-28 LAB — TSH: TSH: 2.55 u[IU]/mL (ref 0.35–4.50)

## 2017-10-28 NOTE — Assessment & Plan Note (Signed)
S: controlled on losartan 25 mg BP Readings from Last 3 Encounters:  10/28/17 120/78  10/03/17 118/70  09/09/17 130/84  A/P: We discussed blood pressure goal of <140/90. Continue current meds

## 2017-10-28 NOTE — Patient Instructions (Addendum)
No changes today  Please stop by lab before you go  I strongly suspect your sleep apnea is causing your fatigue but we will check labs for other causes like low testosterone

## 2017-10-28 NOTE — Progress Notes (Signed)
Subjective:  Bruce [name] is a 67 y.o. year old very pleasant male patient who presents for/with See problem oriented charting ROS- No chest pain or shortness of breath. No headache or blurry vision. Still feeling fatigued   Past Medical History-  Patient Active Problem List   Diagnosis Date Noted  . Type II diabetes mellitus with manifestations ([place]) 08/17/2013    Priority: High  . Crohn's ileitis ([place]) 03/17/2012    Priority: High  . Osteoarthritis of left knee 06/21/2016    Priority: Medium  . Former smoker 12/02/2015    Priority: Medium  . Iron deficiency anemia 09/03/2013    Priority: Medium  . B12 deficiency anemia 09/03/2013    Priority: Medium  . HTN (hypertension) 03/17/2012    Priority: Medium  . Sleep apnea 03/17/2012    Priority: Medium  . Hypothyroidism 03/17/2012    Priority: Medium  . BPH associated with nocturia 03/17/2012    Priority: Medium  . Aortic atherosclerosis ([place]) 03/30/2017    Priority: Low  . 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 Family history of colon cancer 01/26/2014    Priority: Low  . Constipation 05/20/2012    Priority: Low  . Obesity 03/17/2012    Priority: Low  . Hoarseness 06/27/2017  . Onychomycosis 06/21/2016    Medications- reviewed and updated Current Outpatient Medications  Medication Sig Dispense Refill  . acetaminophen (TYLENOL) 500 MG tablet Take 1,000 mg by mouth every 8 (eight) hours as needed for moderate pain.    Marland Kitchen aspirin 81 MG tablet Take 81 mg by mouth daily.      . cyanocobalamin 2000 MCG tablet Take 2,000 mcg by mouth daily.    Marland Kitchen levothyroxine (SYNTHROID, LEVOTHROID) 200 MCG tablet Take 1 tablet (200 mcg total) by mouth daily before breakfast. 90 tablet 3  . losartan (COZAAR) 25 MG tablet Take 1 tablet (25 mg total) by mouth daily. 90 tablet 3   No current facility-administered medications for this visit.     Objective: BP 120/78 (BP Location: Left Arm, Patient  Position: Sitting, Cuff Size: Large)   Pulse 98   Temp 98.1 F (36.7 C) (Oral)   Ht 6' (1.829 m)   Wt 243 lb (110.2 kg)   SpO2 97%   BMI 32.96 kg/m  Gen: NAD, resting comfortably Watering of right eye noted  CV: RRR no murmurs rubs or gallops Lungs: CTAB no crackles, wheeze, rhonchi Abdomen: soft/nontender/obese Ext: no edema Skin: warm, dry Neuro: speech normal, hard of hearing   Assessment/Plan:  Other notes: 1.eye better once got air conditioning fixed   HTN (hypertension) S: controlled on losartan 25 mg BP Readings from Last 3 Encounters:  10/28/17 120/78  10/03/17 118/70  09/09/17 130/84  A/P: We discussed blood pressure goal of <140/90. Continue current meds  Sleep apnea S:  fatigued for sometime- for at least a year- no chest pain or shortness of breath. Doesn't tolerate cpap due to blowing into right eye and causing issues.  A/P: discussed fatigue is likely due to sleep apnea. We will check other labs like testosterone per his request- if low would return 8-9 am fasting  Hypothyroidism S: On thyroid medication-200 mcg levothyroxine.   Lab Results  Component Value Date   TSH 0.38 08/05/2017  A/P: dose increase within 6 months- will check TSh for stability. Especially with ongoing fatigue  Type II diabetes mellitus with manifestations ([place]) S:  controlled on no rx Lab Results  Component  Value Date   HGBA1C 6.1 06/27/2017   HGBA1C 6.3 03/07/2017   HGBA1C 5.9 10/18/2016   A/P: will update a1c- offered 6 month repeat but he wants to do CPE before end of year  Return in about 14 weeks (around 02/03/2018) for physical.  Lab/Order associations: Type 2 diabetes mellitus with complication, without long-term current use of insulin ([place]) - Plan: Basic metabolic panel, Hemoglobin A1c, CBC  Essential hypertension - Plan: CBC  Hypothyroidism, unspecified type - Plan: TSH  Fatigue, unspecified type - Plan: CBC, Testos,Total,Free and SHBG (Male)  Sleep apnea,  unspecified type  Return precautions advised.  Garret Reddish, MD

## 2017-10-28 NOTE — Assessment & Plan Note (Signed)
S:  fatigued for sometime- for at least a year- no chest pain or shortness of breath. Doesn't tolerate cpap due to blowing into right eye and causing issues.  A/P: discussed fatigue is likely due to sleep apnea. We will check other labs like testosterone per his request- if low would return 8-9 am fasting

## 2017-10-28 NOTE — Assessment & Plan Note (Signed)
S:  controlled on no rx Lab Results  Component Value Date   HGBA1C 6.1 06/27/2017   HGBA1C 6.3 03/07/2017   HGBA1C 5.9 10/18/2016   A/P: will update a1c- offered 6 month repeat but he wants to do CPE before end of year

## 2017-10-28 NOTE — Assessment & Plan Note (Signed)
S: On thyroid medication-200 mcg levothyroxine.   Lab Results  Component Value Date   TSH 0.38 08/05/2017  A/P: dose increase within 6 months- will check TSh for stability. Especially with ongoing fatigue

## 2017-11-03 LAB — TESTOS,TOTAL,FREE AND SHBG (FEMALE)
Free Testosterone: 48.5 pg/mL (ref 35.0–155.0)
Sex Hormone Binding: 52 nmol/L (ref 22–77)
Testosterone, Total, LC-MS-MS: 437 ng/dL (ref 250–1100)

## 2017-12-12 ENCOUNTER — Ambulatory Visit (INDEPENDENT_AMBULATORY_CARE_PROVIDER_SITE_OTHER): Payer: Medicare Other

## 2017-12-12 DIAGNOSIS — Z23 Encounter for immunization: Secondary | ICD-10-CM | POA: Diagnosis not present

## 2017-12-12 NOTE — Progress Notes (Signed)
I have reviewed the patient's encounter and agree with the documentation.  Bruce [name]. Jerline Pain, MD 12/12/2017 3:12 PM

## 2017-12-12 NOTE — Progress Notes (Signed)
Patient in today for Flu Vaccine. VIS given. Administered in left arm. Patient tolerated well.

## 2017-12-12 NOTE — Patient Instructions (Signed)
There are no preventive care reminders to display for this patient.  Depression screen [facility] 2/9 06/27/2017 06/21/2016 06/21/2016  Decreased Interest 0 0 0  Down, Depressed, Hopeless 0 1 -  PHQ - 2 Score 0 1 0

## 2017-12-25 ENCOUNTER — Other Ambulatory Visit: Payer: Self-pay | Admitting: Internal Medicine

## 2018-01-05 ENCOUNTER — Other Ambulatory Visit: Payer: Self-pay | Admitting: Internal Medicine

## 2018-01-06 ENCOUNTER — Telehealth: Payer: Self-pay | Admitting: Internal Medicine

## 2018-01-06 NOTE — Telephone Encounter (Signed)
Dr Hilarie Fredrickson- Patient's daughter calls stating that starting about 3 weeks ago, patient starting having abdominal pain, episodes of vomiting and diarrhea as well as distention. She told him to go to the ER but he refused. She indicates that she has Crohns too so she gave him some of her Bentyl and this helped his pain some. He told her he definitely wasn't going to the ER then. Patient is not currently with daughter so I got his cell number and spoke to him.  Patient describes several weeks of severe sharp and crampy abdominal pain below umbilicus. States he has had abdominal distention (although not nearly as bad now). He states that he has been having 6-7 loose stools daily but no blood noted in his stool. Patient also states that he started himself back on Pentasa 1 tablet twice daily and this seems to have helped some. He would like refills of Pentasa.  Please advise. Refill pentasa? Have him come to office first?

## 2018-01-07 MED ORDER — MESALAMINE ER 500 MG PO CPCR: 1000.0000 mg | ORAL_CAPSULE | Freq: Three times a day (TID) | ORAL | 2 refills | Status: DC

## 2018-01-07 NOTE — Telephone Encounter (Signed)
Pentasa 1 g three times daily.   If this does not  get symptoms under complete control then I would repeat a budesonide course 9 mg x 8 weeks.

## 2018-01-07 NOTE — Telephone Encounter (Signed)
Advised that Dr Hilarie Fredrickson is okay with restarting Pentasa 1 gram three times daily. However, if this doesn't get symptoms under control pretty quickly, we will need to restart budesonide. Understanding verbalized.

## 2018-02-04 ENCOUNTER — Ambulatory Visit (INDEPENDENT_AMBULATORY_CARE_PROVIDER_SITE_OTHER): Payer: Medicare Other | Admitting: Family Medicine

## 2018-02-04 ENCOUNTER — Encounter: Payer: Self-pay | Admitting: Family Medicine

## 2018-02-04 VITALS — BP 136/78 | HR 91 | Temp 97.8°F | Ht 72.0 in | Wt 233.6 lb

## 2018-02-04 DIAGNOSIS — K50012 Crohn's disease of small intestine with intestinal obstruction: Secondary | ICD-10-CM

## 2018-02-04 DIAGNOSIS — E039 Hypothyroidism, unspecified: Secondary | ICD-10-CM

## 2018-02-04 DIAGNOSIS — D519 Vitamin B12 deficiency anemia, unspecified: Secondary | ICD-10-CM

## 2018-02-04 DIAGNOSIS — E118 Type 2 diabetes mellitus with unspecified complications: Secondary | ICD-10-CM

## 2018-02-04 DIAGNOSIS — G4730 Sleep apnea, unspecified: Secondary | ICD-10-CM

## 2018-02-04 DIAGNOSIS — I10 Essential (primary) hypertension: Secondary | ICD-10-CM

## 2018-02-04 DIAGNOSIS — Z0000 Encounter for general adult medical examination without abnormal findings: Secondary | ICD-10-CM

## 2018-02-04 DIAGNOSIS — R351 Nocturia: Secondary | ICD-10-CM

## 2018-02-04 DIAGNOSIS — I700 Atherosclerosis of aorta: Secondary | ICD-10-CM

## 2018-02-04 DIAGNOSIS — N401 Enlarged prostate with lower urinary tract symptoms: Secondary | ICD-10-CM

## 2018-02-04 LAB — URINALYSIS, ROUTINE W REFLEX MICROSCOPIC
Bilirubin Urine: NEGATIVE
Ketones, ur: NEGATIVE
Leukocytes, UA: NEGATIVE
Nitrite: NEGATIVE
RBC / HPF: NONE SEEN (ref 0–?)
Specific Gravity, Urine: 1.01 (ref 1.000–1.030)
Total Protein, Urine: 30 — AB
Urine Glucose: NEGATIVE
Urobilinogen, UA: 0.2 (ref 0.0–1.0)
pH: 5 (ref 5.0–8.0)

## 2018-02-04 LAB — LIPID PANEL
Cholesterol: 142 mg/dL (ref 0–200)
HDL: 40.8 mg/dL (ref >39.00)
LDL Cholesterol: 79 mg/dL (ref 0–99)
NonHDL: 101.49
Total CHOL/HDL Ratio: 3
Triglycerides: 111 mg/dL (ref 0.0–149.0)
VLDL: 22.2 mg/dL (ref 0.0–40.0)

## 2018-02-04 LAB — CBC
HCT: 47.4 % (ref 39.0–52.0)
Hemoglobin: 15.8 g/dL (ref 13.0–17.0)
MCHC: 33.4 g/dL (ref 30.0–36.0)
MCV: 89.3 fl (ref 78.0–100.0)
Platelets: 423 10*3/uL — ABNORMAL HIGH (ref 150.0–400.0)
RBC: 5.3 Mil/uL (ref 4.22–5.81)
RDW: 14.9 % (ref 11.5–15.5)
WBC: 7.4 10*3/uL (ref 4.0–10.5)

## 2018-02-04 LAB — POCT GLYCOSYLATED HEMOGLOBIN (HGB A1C): Hemoglobin A1C: 5.4 % (ref 4.0–5.6)

## 2018-02-04 LAB — COMPREHENSIVE METABOLIC PANEL
ALT: 16 U/L (ref 0–53)
AST: 21 U/L (ref 0–37)
Albumin: 4.1 g/dL (ref 3.5–5.2)
Alkaline Phosphatase: 97 U/L (ref 39–117)
BUN: 10 mg/dL (ref 6–23)
CO2: 28 mEq/L (ref 19–32)
Calcium: 9.6 mg/dL (ref 8.4–10.5)
Chloride: 102 mEq/L (ref 96–112)
Creatinine, Ser: 1.22 mg/dL (ref 0.40–1.50)
GFR: 62.65 mL/min (ref >60.00)
Glucose, Bld: 91 mg/dL (ref 70–99)
Potassium: 4.3 mEq/L (ref 3.5–5.1)
Sodium: 138 mEq/L (ref 135–145)
Total Bilirubin: 0.6 mg/dL (ref 0.2–1.2)
Total Protein: 7.1 g/dL (ref 6.0–8.3)

## 2018-02-04 LAB — TSH: TSH: 1.25 u[IU]/mL (ref 0.35–4.50)

## 2018-02-04 LAB — VITAMIN B12: Vitamin B-12: 1222 pg/mL — ABNORMAL HIGH (ref 211–911)

## 2018-02-04 NOTE — Addendum Note (Signed)
Addended by: Tomi Likens on: 02/04/2018 02:25 PM   Modules accepted: Orders

## 2018-02-04 NOTE — Progress Notes (Signed)
Phone: [phone]  Subjective:  Patient presents today for their annual physical. Chief complaint-noted.   See problem oriented charting- ROS- full  review of systems was completed and negative except for: chest pain, voice change, poor sleep  The following were reviewed and entered/updated in epic: Past Medical History:  Diagnosis Date  . Anemia   . Arthritis   . Bowel obstruction ([place])   . Chronic headaches   . Colon polyps   . Community acquired pneumonia 03/02/2015  . Crohn's disease ([place])   . Diverticulosis   . DM (diabetes mellitus) ([place])   . Gallstones   . Hemorrhoids   . Hypertension   . Hypothyroidism   . Ileitis   . Insomnia   . Obesity   . Prostate enlargement   . Sleep apnea    Patient Active Problem List   Diagnosis Date Noted  . Type II diabetes mellitus with manifestations ([place]) 08/17/2013    Priority: High  . Crohn's ileitis ([place]) 03/17/2012    Priority: High  . Osteoarthritis of left knee 06/21/2016    Priority: Medium  . Former smoker 12/02/2015    Priority: Medium  . Iron deficiency anemia 09/03/2013    Priority: Medium  . B12 deficiency anemia 09/03/2013    Priority: Medium  . HTN (hypertension) 03/17/2012    Priority: Medium  . Sleep apnea 03/17/2012    Priority: Medium  . Hypothyroidism 03/17/2012    Priority: Medium  . BPH associated with nocturia 03/17/2012    Priority: Medium  . Aortic atherosclerosis ([place]) 03/30/2017    Priority: Low  . 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 Family history of colon cancer 01/26/2014    Priority: Low  . Constipation 05/20/2012    Priority: Low  . Obesity 03/17/2012    Priority: Low  . Hoarseness 06/27/2017  . Onychomycosis 06/21/2016   Past Surgical History:  Procedure Laterality Date  . BOWEL RESECTION N/A 08/11/2013   x2- one at [place] one in 2015  . CHOLECYSTECTOMY    . COLONOSCOPY  01/19/2011   Procedure: COLONOSCOPY;  Surgeon: Rogene Houston,  MD;  [facility];  Service: Endoscopy;  Laterality: N/A;  9:30 am  . INCISION AND DRAINAGE PERIRECTAL ABSCESS N/A 09/08/2013   Procedure: IRRIGATION AND DEBRIDEMENT PERIRECTAL ABSCESS;  Surgeon: Imogene Burn. Georgette Dover, MD;  Location: [place];  Service: General;  Laterality: N/A;  . LAPAROTOMY N/A 08/11/2013   Procedure: EXPLORATORY LAPAROTOMY ;  Surgeon: Joyice Faster. Cornett, MD;  Location: [place];  Service: General;  Laterality: N/A;  . NASAL SINUS SURGERY    . seventh nerve face    . spleenectomy     Punctured after a fall in 1963  . TONSILLECTOMY AND ADENOIDECTOMY      Family History  Problem Relation Age of Onset  . Colon cancer Mother   . Uterine cancer Mother   . Colon cancer Sister   . Healthy Sister   . Healthy Sister   . Crohn's disease Daughter   . Arthritis Daughter   . Healthy Son   . Brain cancer Father   . Other Daughter        accidental death  . Heart disease Maternal Grandmother   . Bladder Cancer Paternal Grandfather   . Irritable bowel syndrome Sister     Medications- reviewed and updated Current Outpatient Medications  Medication Sig Dispense Refill  . acetaminophen (TYLENOL) 500 MG tablet Take 1,000 mg by mouth every 8 (  eight) hours as needed for moderate pain.    Marland Kitchen aspirin 81 MG tablet Take 81 mg by mouth daily.      . cyanocobalamin 2000 MCG tablet Take 2,000 mcg by mouth daily.    Marland Kitchen levothyroxine (SYNTHROID, LEVOTHROID) 200 MCG tablet Take 1 tablet (200 mcg total) by mouth daily before breakfast. 90 tablet 3  . losartan (COZAAR) 25 MG tablet Take 1 tablet (25 mg total) by mouth daily. 90 tablet 3  . mesalamine (PENTASA) 500 MG CR capsule Take 2 capsules (1,000 mg total) by mouth 3 (three) times daily. (Patient taking differently: Take 1,000 mg by mouth 2 (two) times daily. ) 180 capsule 2   No current facility-administered medications for this visit.     Allergies-reviewed and updated Allergies  Allergen Reactions  . Lisinopril Cough  . Coconut  Flavor Other (See Comments)    Does not eat because of crohn's     Social History   Social History Narrative   Widowed 2018.  2 living children. 1 deceased daughter car wreck. 2 grandkids      Retired from [employer].       [place] and spends time   Objective: BP 136/78 (BP Location: Left Arm, Patient Position: Sitting, Cuff Size: Large)   Pulse 91   Temp 97.8 F (36.6 C) (Oral)   Ht 6' (1.829 m)   Wt 233 lb 9.6 oz (106 kg)   SpO2 95%   BMI 31.68 kg/m   Gen: NAD, resting comfortably HEENT: Mucous membranes are moist. Oropharynx normal Neck: no thyromegaly CV: RRR no murmurs rubs or gallops Lungs: CTAB no crackles, wheeze, rhonchi Abdomen: soft/nontender/nondistended/normal bowel sounds. No rebound or guarding.  Ext: no edema Skin: warm, dry, no obvious sweating or skin changes over forehead Neuro: grossly normal, moves all extremities, PERRLA  Assessment/Plan:  67 y.o. male presenting for annual physical.  Health Maintenance counseling: 1. Anticipatory guidance: Patient counseled regarding regular dental exams -q6 month advised but needs new denitst, eye exams - yearly,  avoiding smoking and second hand smoke, limiting alcohol to 2 beverages per day - drinks none.   2. Risk factor reduction:  Advised patient of need for regular exercise and diet rich and fruits and vegetables to reduce risk of heart attack and stroke. Exercise- not walking this year- encouraged to restart- hard as having to walk alone- encouraged treadmill or walking in place. Diet-patient is down 10 pounds-outside of holiday parties- trying to eat reallly well such as focusing on veggies.  Wt Readings from Last 3 Encounters:  02/04/18 233 lb 9.6 oz (106 kg)  10/28/17 243 lb (110.2 kg)  10/03/17 238 lb (108 kg)  3. Immunizations/screenings/ancillary studies-discussed Shingrix at pharmacy- he declines strongly  Immunization History  Administered  Date(s) Administered  . Influenza, High Dose Seasonal PF 12/02/2015, 11/02/2016, 12/12/2017  . Influenza,inj,Quad PF,6+ Mos 01/06/2014  . Pneumococcal Conjugate-13 01/21/2014  . Pneumococcal Polysaccharide-23 04/03/2012, 06/27/2017  . Tdap 04/03/2012  4. Prostate cancer screening- urology is now monitoring PSA trend-last checked in March 2019 with plan for 1 year follow-up.  BPH likely contributes to symptoms Lab Results  Component Value Date   PSA 6.74 (H) 11/02/2016   PSA 6.74 11/02/2016   PSA 3.09 01/25/2014   5. Colon cancer screening - 03/08/17 with 5 year repat 6. Skin cancer screening- no dermatologist. advised regular sunscreen use. Denies worrisome, changing, or new skin lesions.  7.  Former smoker- quit 2005  with over 160 pack years.  Enrolled in lung cancer screening program.No mention of AAA on 08/04/2013 CT abdomen pelvis  Status of chronic or acute concerns   Sweaty, burning for a few minutes after eating across forehead and can happen randomly but denies any night sweats- going on 1-2 months but not getting worse. Down 10 lbs but intentional- more veggies and no late night eating. Will check TSH but also encouraged patient to check CBGs to make sure not low when occurring to be on safe side. He will come back for new or worsening symptoms.   Intermittent hoarseness, otherwise denies potential reflux symptoms. Will monitor/see [place] back if worsens.   Right upper chest pain- actually feels better with pushing on it- goes away- will monitor only. Not exertional. Actually only happens with sitting still- will monitor only.   Diabetes-has been controlled without medication- even better controlled today with 10 pound weight loss  Hypertension-controlled on losartan 25 mg  Sleep apnea- does not tolerate CPAP unfortunately-deals with some fatigue related to this.  Testosterone and thyroid levels have been normal. Has tried patches for eyes to keep air out without relief.    Hypothyroidism-on levothyroxine 200 mcg, update TSH  Hyperlipidemia-reasonably controlled with LDL under 100-but with diabetes would be ideal to be on statin for primary prevention-recommended once a week atorvastatin 20 mg to try to get LDL under 70 if possible.  We will update lipids today first given weight loss  Crohn's ileitis-follows with [facility] and is on mesalamine  B12 deficiency-on oral B12 over-the-counter-update levels today  History of iron deficiency anemia-ferritin has been normal on most recent checks and will not repeat unless CBC shows low hemoglobin  Aortic atherosclerosis (HCC) Discussed aortic atherosclerosis- this would be another indication to consider statin therapy-we will wait and see what his numbers look like today but likely start atorvastatin 20 mill grams once a week  Return in about 4 months (around 06/06/2018).  Lab/Order associations: FASTING  Preventative health care - Plan: CBC, Comprehensive metabolic panel, Lipid panel, TSH, Urinalysis  Type II diabetes mellitus with manifestations ([place]) - Plan: POCT glycosylated hemoglobin (Hb A1C), CBC, Comprehensive metabolic panel, Lipid panel, Urinalysis  Hypothyroidism, unspecified type - Plan: TSH  Anemia due to vitamin B12 deficiency, unspecified B12 deficiency type - Plan: Vitamin B12  Crohn's disease of ileum with intestinal obstruction (H[facility])  Essential hypertension  Sleep apnea, unspecified type  BPH associated with nocturia  Aortic atherosclerosis ([place])  Return precautions advised.  Garret Reddish, MD

## 2018-02-04 NOTE — Assessment & Plan Note (Signed)
Discussed aortic atherosclerosis- this would be another indication to consider statin therapy-we will wait and see what his numbers look like today but likely start atorvastatin 20 mill grams once a week

## 2018-02-04 NOTE — Patient Instructions (Addendum)
Please stop by lab before you go  4 month follow up unless forehead issues worsen- see me sooner.   One thing you could do would be to check your sugar when this is happening and make sure its not low such as under 75

## 2018-02-05 ENCOUNTER — Encounter: Payer: Self-pay | Admitting: Family Medicine

## 2018-02-05 ENCOUNTER — Other Ambulatory Visit: Payer: Self-pay

## 2018-02-05 MED ORDER — ATORVASTATIN CALCIUM 20 MG PO TABS: 20.0000 mg | ORAL_TABLET | Freq: Every day | ORAL | 3 refills | Status: DC

## 2018-02-05 NOTE — Telephone Encounter (Signed)
This encounter was created in error - please disregard.

## 2018-02-07 ENCOUNTER — Other Ambulatory Visit: Payer: Self-pay

## 2018-02-07 ENCOUNTER — Encounter: Payer: Self-pay | Admitting: Family Medicine

## 2018-02-07 MED ORDER — ATORVASTATIN CALCIUM 20 MG PO TABS: 20.0000 mg | ORAL_TABLET | ORAL | 3 refills | Status: DC

## 2018-03-10 ENCOUNTER — Encounter: Payer: Self-pay | Admitting: Family Medicine

## 2018-03-12 ENCOUNTER — Other Ambulatory Visit: Payer: Self-pay

## 2018-03-12 MED ORDER — LOVASTATIN 10 MG PO TABS: 10.0000 mg | ORAL_TABLET | ORAL | 3 refills | Status: DC

## 2018-03-19 DIAGNOSIS — E119 Type 2 diabetes mellitus without complications: Secondary | ICD-10-CM | POA: Diagnosis not present

## 2018-03-19 DIAGNOSIS — R0789 Other chest pain: Secondary | ICD-10-CM | POA: Diagnosis not present

## 2018-03-19 DIAGNOSIS — E785 Hyperlipidemia, unspecified: Secondary | ICD-10-CM | POA: Diagnosis not present

## 2018-03-19 DIAGNOSIS — R079 Chest pain, unspecified: Secondary | ICD-10-CM | POA: Diagnosis not present

## 2018-03-19 DIAGNOSIS — I10 Essential (primary) hypertension: Secondary | ICD-10-CM | POA: Diagnosis not present

## 2018-03-19 DIAGNOSIS — R7989 Other specified abnormal findings of blood chemistry: Secondary | ICD-10-CM | POA: Diagnosis not present

## 2018-03-19 DIAGNOSIS — Z87891 Personal history of nicotine dependence: Secondary | ICD-10-CM | POA: Diagnosis not present

## 2018-03-19 DIAGNOSIS — Z79899 Other long term (current) drug therapy: Secondary | ICD-10-CM | POA: Diagnosis not present

## 2018-03-19 DIAGNOSIS — K5090 Crohn's disease, unspecified, without complications: Secondary | ICD-10-CM | POA: Diagnosis not present

## 2018-03-19 DIAGNOSIS — Z91018 Allergy to other foods: Secondary | ICD-10-CM | POA: Diagnosis not present

## 2018-03-20 DIAGNOSIS — E785 Hyperlipidemia, unspecified: Secondary | ICD-10-CM | POA: Diagnosis not present

## 2018-03-20 DIAGNOSIS — R079 Chest pain, unspecified: Secondary | ICD-10-CM | POA: Diagnosis not present

## 2018-03-20 DIAGNOSIS — I10 Essential (primary) hypertension: Secondary | ICD-10-CM | POA: Diagnosis not present

## 2018-03-26 DIAGNOSIS — I10 Essential (primary) hypertension: Secondary | ICD-10-CM | POA: Diagnosis not present

## 2018-03-26 DIAGNOSIS — I428 Other cardiomyopathies: Secondary | ICD-10-CM | POA: Diagnosis not present

## 2018-03-26 DIAGNOSIS — E7800 Pure hypercholesterolemia, unspecified: Secondary | ICD-10-CM | POA: Diagnosis not present

## 2018-04-01 ENCOUNTER — Ambulatory Visit (INDEPENDENT_AMBULATORY_CARE_PROVIDER_SITE_OTHER): Payer: Medicare Other | Admitting: Family Medicine

## 2018-04-01 ENCOUNTER — Encounter: Payer: Self-pay | Admitting: Family Medicine

## 2018-04-01 DIAGNOSIS — N5201 Erectile dysfunction due to arterial insufficiency: Secondary | ICD-10-CM

## 2018-04-01 DIAGNOSIS — I428 Other cardiomyopathies: Secondary | ICD-10-CM

## 2018-04-01 DIAGNOSIS — E118 Type 2 diabetes mellitus with unspecified complications: Secondary | ICD-10-CM | POA: Diagnosis not present

## 2018-04-01 DIAGNOSIS — K219 Gastro-esophageal reflux disease without esophagitis: Secondary | ICD-10-CM | POA: Insufficient documentation

## 2018-04-01 DIAGNOSIS — I10 Essential (primary) hypertension: Secondary | ICD-10-CM | POA: Diagnosis not present

## 2018-04-01 DIAGNOSIS — K50012 Crohn's disease of small intestine with intestinal obstruction: Secondary | ICD-10-CM

## 2018-04-01 DIAGNOSIS — N529 Male erectile dysfunction, unspecified: Secondary | ICD-10-CM | POA: Insufficient documentation

## 2018-04-01 DIAGNOSIS — I700 Atherosclerosis of aorta: Secondary | ICD-10-CM

## 2018-04-01 HISTORY — DX: Other cardiomyopathies: I42.8

## 2018-04-01 HISTORY — DX: Gastro-esophageal reflux disease without esophagitis: K21.9

## 2018-04-01 NOTE — Progress Notes (Addendum)
Phone [phone]   Subjective:  Bruce [name] is a 68 y.o. year old very pleasant male patient who presents for/with See problem oriented charting ROS-no further chest pain.  No shortness of breath reported today.  Continues with erectile issues.  Felt extremely fatigued on carvedilol and stop medication.  Fatigue resolved off the medication.  Past Medical History-  Patient Active Problem List   Diagnosis Date Noted  . Nonischemic cardiomyopathy ([place]) 04/01/2018    Priority: High  . Type II diabetes mellitus with manifestations ([place]) 08/17/2013    Priority: High  . Crohn's ileitis ([place]) 03/17/2012    Priority: High  . Osteoarthritis of left knee 06/21/2016    Priority: Medium  . Former smoker 12/02/2015    Priority: Medium  . Iron deficiency anemia 09/03/2013    Priority: Medium  . B12 deficiency anemia 09/03/2013    Priority: Medium  . HTN (hypertension) 03/17/2012    Priority: Medium  . Sleep apnea 03/17/2012    Priority: Medium  . Hypothyroidism 03/17/2012    Priority: Medium  . BPH associated with nocturia 03/17/2012    Priority: Medium  . Erectile dysfunction 04/01/2018    Priority: Low  . Aortic atherosclerosis ([place]) 03/30/2017    Priority: Low  . 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 Family history of colon cancer 01/26/2014    Priority: Low  . Constipation 05/20/2012    Priority: Low  . Obesity 03/17/2012    Priority: Low  . GERD (gastroesophageal reflux disease) 04/01/2018  . Hoarseness 06/27/2017  . Onychomycosis 06/21/2016    Medications- reviewed and updated Current Outpatient Medications  Medication Sig Dispense Refill  . acetaminophen (TYLENOL) 500 MG tablet Take 1,000 mg by mouth every 8 (eight) hours as needed for moderate pain.    Marland Kitchen aspirin 81 MG tablet Take 81 mg by mouth daily.      . cyanocobalamin 2000 MCG tablet Take 2,000 mcg by mouth daily.    Marland Kitchen levothyroxine (SYNTHROID, LEVOTHROID) 200 MCG  tablet Take 1 tablet (200 mcg total) by mouth daily before breakfast. 90 tablet 3  . losartan (COZAAR) 25 MG tablet Take 1 tablet (25 mg total) by mouth daily. 90 tablet 3  . lovastatin (MEVACOR) 10 MG tablet Take 10 mg by mouth at bedtime.    . mesalamine (PENTASA) 500 MG CR capsule Take 2 capsules (1,000 mg total) by mouth 3 (three) times daily. (Patient taking differently: Take 1,000 mg by mouth 2 (two) times daily. ) 180 capsule 2  . tadalafil (ADCIRCA/CIALIS) 20 MG tablet Take by mouth.    . Garlic 3893 MG CAPS Take by mouth.    . Multiple Vitamins-Minerals (ONE-A-DAY MENS HEALTH FORMULA) TABS Take by mouth.     No current facility-administered medications for this visit.      Objective:  BP 110/70 (BP Location: Right Arm, Patient Position: Sitting, Cuff Size: Large)   Pulse 80   Temp 98.4 F (36.9 C) (Oral)   Wt 226 lb 13 oz (102.9 kg)   SpO2 96%   BMI 30.76 kg/m  Gen: NAD, resting comfortably CV: RRR no murmurs rubs or gallops Lungs: CTAB no crackles, wheeze, rhonchi Abdomen: soft/nontender/nondistended Ext: no edema Skin: warm, dry    Assessment and Plan   [facility] ED visit follow-up  Nonischemic cardiomyopathy ([place]) S: Patient was seen in the [facility] emergency room for atypical chest pain which was ultimately thought to be related to reflux.  He had a stress  Cardiolite done which was low risk for ischemia per the report.  Patient was seen by cardiology outpatient with [facility]-they noted prior echocardiogram with EF 2014 was 45 to 50% with mild aortic insufficiency,Mild dilation of ascending aorta.  Stress Cardiolite tested showed 47% EF this year- cardiology Diagnosed with unchanged mild nonischemic cardiomyopathy for 6 years.  This was considered to be asymptomatic.Losartan was stopped and Coreg 6.25 mg twice a day was started  Plan was if he had more chest discomfort for coronary CT. 54-monthfollow-up was planned A/P: Asymptomatic nonischemic cardiomyopathy noted.   Patient plans to continue to follow with cardiology.  See hypertension section but he did not tolerate the change to carvedilol.  Also see GERD section for discussion of chest pain.  Also please note Stress test was at [facility] and not in our imaging or CV procedure results  HTN (hypertension) S: controlled on no Rx today but Home numbers up to 1188systolic blood pressure.  He had been placed on carvedilol by cardiology but he simply could not tolerate this-felt absolutely exhausted like he could barely get off the couch.  He stopped this medication and did not restart his losartan yet BP Readings from Last 3 Encounters:  04/01/18 110/70  02/04/18 136/78  10/28/17 120/78  A/P: We discussed blood pressure goal of <140/90-since home blood pressures have been elevated and also for protection from diabetic nephropathy given history of elevated microalbumin to creatinine ratio-we will restart his losartan 25 mg.     Erectile dysfunction Patient requests Byetta shots for his penis-I discussed Byetta as a medication used for diabetes and not for erectile dysfunction-patient still wants me to prescribe this medication and I declined.  I suggested he follow-up with urology to discuss options.  He will consider.  GERD (gastroesophageal reflux disease) S:Started with pressure in central chest about 2 weeks ago. Pain lasted all day then went to ER when it worsened. Negative troponin's in hospital with nospecific EKG changes. Night before had spicy porkchop sandwich that was spicy but it was over 24 hours- going to trial pepcid- has done this without symptoms.  A/P: Strongly suspect chest pain was reflux related- continue Pepcid before spicy meals  Type II diabetes mellitus with manifestations ([place], Chronic S: stable without rx. A1c below 5.5 A/P: asks about byetta per ED section- declined- see that section.   Crohn's disease of ileum with intestinal obstruction ([place], Chronic - follows with  [facility]. No recent issues. Stable.   Aortic atherosclerosis ([place], Chronic - stable, continue risk factor modification   Future Appointments  Date Time Provider [place] 06/06/2018  1:20 PM HMarin Olp MD [name]  Already planned for regular diabetes follow-up  Return precautions advised.  SGarret Reddish MD

## 2018-04-01 NOTE — Assessment & Plan Note (Signed)
Patient requests Byetta shots for his penis-I discussed Byetta as a medication used for diabetes and not for erectile dysfunction-patient still wants me to prescribe this medication and I declined.  I suggested he follow-up with urology to discuss options.  He will consider.

## 2018-04-01 NOTE — Patient Instructions (Addendum)
CONTINUE OFF carvedilol since you felt so fatigued  Start back the losartan 25 mg again since you did better on that medicine.   Continue lovastatin on daily basis per cardiology- you had memory loss on atorvastatin in the past- please let us know if you have any issues again with memory loss- as may need to stop this or go back to once a week.

## 2018-04-01 NOTE — Assessment & Plan Note (Signed)
S: controlled on no Rx today but Home numbers up to 252 systolic blood pressure.  He had been placed on carvedilol by cardiology but he simply could not tolerate this-felt absolutely exhausted like he could barely get off the couch.  He stopped this medication and did not restart his losartan yet BP Readings from Last 3 Encounters:  04/01/18 110/70  02/04/18 136/78  10/28/17 120/78  A/P: We discussed blood pressure goal of <140/90-since home blood pressures have been elevated and also for protection from diabetic nephropathy given history of elevated microalbumin to creatinine ratio-we will restart his losartan 25 mg.

## 2018-04-01 NOTE — Assessment & Plan Note (Addendum)
S: Patient was seen in the [facility] emergency room for atypical chest pain which was ultimately thought to be related to reflux.  He had a stress Cardiolite done which was low risk for ischemia per the report.  Patient was seen by cardiology outpatient with [facility]-they noted prior echocardiogram with EF 2014 was 45 to 50% with mild aortic insufficiency,Mild dilation of ascending aorta.  Stress Cardiolite tested showed 47% EF this year- cardiology Diagnosed with unchanged mild nonischemic cardiomyopathy for 6 years.  This was considered to be asymptomatic.Losartan was stopped and Coreg 6.25 mg twice a day was started  Plan was if he had more chest discomfort for coronary CT. 50-monthfollow-up was planned A/P: Asymptomatic nonischemic cardiomyopathy noted.  Patient plans to continue to follow with cardiology.  See hypertension section but he did not tolerate the change to carvedilol.  Also see GERD section for discussion of chest pain.  Also please note Stress test was at [facility] and not in our imaging or CV procedure results

## 2018-04-01 NOTE — Assessment & Plan Note (Signed)
S:Started with pressure in central chest about 2 weeks ago. Pain lasted all day then went to ER when it worsened. Negative troponin's in hospital with nospecific EKG changes. Night before had spicy porkchop sandwich that was spicy but it was over 24 hours- going to trial pepcid- has done this without symptoms.  A/P: Strongly suspect chest pain was reflux related- continue Pepcid before spicy meals

## 2018-04-04 DIAGNOSIS — R3914 Feeling of incomplete bladder emptying: Secondary | ICD-10-CM | POA: Diagnosis not present

## 2018-05-20 ENCOUNTER — Other Ambulatory Visit: Payer: Self-pay | Admitting: Family Medicine

## 2018-06-02 ENCOUNTER — Encounter: Payer: Self-pay | Admitting: Family Medicine

## 2018-06-06 ENCOUNTER — Ambulatory Visit: Payer: Medicare Other | Admitting: Family Medicine

## 2018-10-19 ENCOUNTER — Encounter: Payer: Self-pay | Admitting: Family Medicine

## 2018-10-31 DIAGNOSIS — R3915 Urgency of urination: Secondary | ICD-10-CM | POA: Diagnosis not present

## 2018-10-31 DIAGNOSIS — R3121 Asymptomatic microscopic hematuria: Secondary | ICD-10-CM | POA: Diagnosis not present

## 2018-11-12 DIAGNOSIS — R3121 Asymptomatic microscopic hematuria: Secondary | ICD-10-CM | POA: Diagnosis not present

## 2018-11-12 DIAGNOSIS — K439 Ventral hernia without obstruction or gangrene: Secondary | ICD-10-CM | POA: Diagnosis not present

## 2018-11-12 DIAGNOSIS — N210 Calculus in bladder: Secondary | ICD-10-CM | POA: Diagnosis not present

## 2018-11-12 DIAGNOSIS — K429 Umbilical hernia without obstruction or gangrene: Secondary | ICD-10-CM | POA: Diagnosis not present

## 2018-11-14 ENCOUNTER — Encounter: Payer: Self-pay | Admitting: Family Medicine

## 2018-12-05 DIAGNOSIS — N210 Calculus in bladder: Secondary | ICD-10-CM | POA: Diagnosis not present

## 2018-12-05 DIAGNOSIS — R311 Benign essential microscopic hematuria: Secondary | ICD-10-CM | POA: Diagnosis not present

## 2018-12-31 DIAGNOSIS — I5189 Other ill-defined heart diseases: Secondary | ICD-10-CM | POA: Diagnosis not present

## 2018-12-31 DIAGNOSIS — I519 Heart disease, unspecified: Secondary | ICD-10-CM | POA: Diagnosis not present

## 2018-12-31 DIAGNOSIS — E7800 Pure hypercholesterolemia, unspecified: Secondary | ICD-10-CM | POA: Insufficient documentation

## 2018-12-31 DIAGNOSIS — I428 Other cardiomyopathies: Secondary | ICD-10-CM | POA: Diagnosis not present

## 2018-12-31 DIAGNOSIS — I080 Rheumatic disorders of both mitral and aortic valves: Secondary | ICD-10-CM | POA: Diagnosis not present

## 2018-12-31 DIAGNOSIS — I517 Cardiomegaly: Secondary | ICD-10-CM | POA: Diagnosis not present

## 2018-12-31 DIAGNOSIS — I10 Essential (primary) hypertension: Secondary | ICD-10-CM | POA: Diagnosis not present

## 2019-02-24 ENCOUNTER — Encounter: Payer: Self-pay | Admitting: Family Medicine

## 2019-02-24 NOTE — Telephone Encounter (Signed)
Left vm message for patient requesting a c/b.  Need to schedule appt for in office visit.

## 2019-02-25 NOTE — Telephone Encounter (Signed)
I have spoke to patient and made app. Reviewed all red words with him. He is aware what he will need to go to ED or Urgent care for.

## 2019-02-26 ENCOUNTER — Ambulatory Visit (HOSPITAL_COMMUNITY)
Admission: RE | Admit: 2019-02-26 | Discharge: 2019-02-26 | Disposition: A | Discharge status: Home / Self Care | Payer: Medicare Other | Source: Ambulatory Visit | Attending: Family Medicine | Admitting: Family Medicine

## 2019-02-26 ENCOUNTER — Other Ambulatory Visit: Payer: Self-pay | Admitting: Family Medicine

## 2019-02-26 ENCOUNTER — Encounter: Payer: Self-pay | Admitting: Family Medicine

## 2019-02-26 ENCOUNTER — Ambulatory Visit (INDEPENDENT_AMBULATORY_CARE_PROVIDER_SITE_OTHER): Payer: Medicare Other | Admitting: Family Medicine

## 2019-02-26 ENCOUNTER — Other Ambulatory Visit: Payer: Self-pay

## 2019-02-26 VITALS — BP 132/86 | HR 81 | Temp 98.3°F | Ht 72.0 in | Wt 234.8 lb

## 2019-02-26 DIAGNOSIS — E039 Hypothyroidism, unspecified: Secondary | ICD-10-CM

## 2019-02-26 DIAGNOSIS — I10 Essential (primary) hypertension: Secondary | ICD-10-CM

## 2019-02-26 DIAGNOSIS — D519 Vitamin B12 deficiency anemia, unspecified: Secondary | ICD-10-CM

## 2019-02-26 DIAGNOSIS — R519 Headache, unspecified: Secondary | ICD-10-CM | POA: Diagnosis not present

## 2019-02-26 DIAGNOSIS — R4781 Slurred speech: Secondary | ICD-10-CM | POA: Diagnosis not present

## 2019-02-26 DIAGNOSIS — E118 Type 2 diabetes mellitus with unspecified complications: Secondary | ICD-10-CM | POA: Diagnosis not present

## 2019-02-26 LAB — LIPID PANEL
Cholesterol: 129 mg/dL (ref 0–200)
HDL: 40.4 mg/dL (ref >39.00)
LDL Cholesterol: 66 mg/dL (ref 0–99)
NonHDL: 88.98
Total CHOL/HDL Ratio: 3
Triglycerides: 114 mg/dL (ref 0.0–149.0)
VLDL: 22.8 mg/dL (ref 0.0–40.0)

## 2019-02-26 LAB — COMPREHENSIVE METABOLIC PANEL
ALT: 15 U/L (ref 0–53)
AST: 20 U/L (ref 0–37)
Albumin: 4.1 g/dL (ref 3.5–5.2)
Alkaline Phosphatase: 87 U/L (ref 39–117)
BUN: 13 mg/dL (ref 6–23)
CO2: 25 mEq/L (ref 19–32)
Calcium: 9.1 mg/dL (ref 8.4–10.5)
Chloride: 104 mEq/L (ref 96–112)
Creatinine, Ser: 1.35 mg/dL (ref 0.40–1.50)
GFR: 52.29 mL/min — ABNORMAL LOW (ref >60.00)
Glucose, Bld: 107 mg/dL — ABNORMAL HIGH (ref 70–99)
Potassium: 4.3 mEq/L (ref 3.5–5.1)
Sodium: 137 mEq/L (ref 135–145)
Total Bilirubin: 0.7 mg/dL (ref 0.2–1.2)
Total Protein: 6.9 g/dL (ref 6.0–8.3)

## 2019-02-26 LAB — CBC WITH DIFFERENTIAL/PLATELET
Basophils Absolute: 0.1 10*3/uL (ref 0.0–0.1)
Basophils Relative: 1.5 % (ref 0.0–3.0)
Eosinophils Absolute: 0.5 10*3/uL (ref 0.0–0.7)
Eosinophils Relative: 5.4 % — ABNORMAL HIGH (ref 0.0–5.0)
HCT: 43.6 % (ref 39.0–52.0)
Hemoglobin: 14.6 g/dL (ref 13.0–17.0)
Lymphocytes Relative: 26.5 % (ref 12.0–46.0)
Lymphs Abs: 2.2 10*3/uL (ref 0.7–4.0)
MCHC: 33.5 g/dL (ref 30.0–36.0)
MCV: 88 fl (ref 78.0–100.0)
Monocytes Absolute: 0.9 10*3/uL (ref 0.1–1.0)
Monocytes Relative: 10.3 % (ref 3.0–12.0)
Neutro Abs: 4.8 10*3/uL (ref 1.4–7.7)
Neutrophils Relative %: 56.3 % (ref 43.0–77.0)
Platelets: 420 10*3/uL — ABNORMAL HIGH (ref 150.0–400.0)
RBC: 4.96 Mil/uL (ref 4.22–5.81)
RDW: 15 % (ref 11.5–15.5)
WBC: 8.5 10*3/uL (ref 4.0–10.5)

## 2019-02-26 LAB — HEMOGLOBIN A1C: Hgb A1c MFr Bld: 5.9 % (ref 4.6–6.5)

## 2019-02-26 LAB — TSH: TSH: 3.72 u[IU]/mL (ref 0.35–4.50)

## 2019-02-26 LAB — VITAMIN B12: Vitamin B-12: 809 pg/mL (ref 211–911)

## 2019-02-26 MED ORDER — GADOBUTROL 1 MMOL/ML IV SOLN
10.0000 mL | Freq: Once | INTRAVENOUS | Status: AC | PRN
  Administered 2019-02-26: 10 mL via INTRAVENOUS

## 2019-02-26 NOTE — Progress Notes (Signed)
Discussed results by phone with patient/daughter  They have seen Dr. Delice Lesch in the past and would like to see her If possible (urgent referral placed today) but also willing to see someone else within [place] group.   New onset headaches worse with laying down and has awoken him from sleep. He is going to try tylenol (has not tried meds yet) but with some abnormalities on MRI- we opted to refer back to neurology urgently. Question of prior stroke- he is on aspirin and wonder if we should advance to plavix or investigate for other potential causes like atrial fibrillation.   [facility] team- please make sure this gets processed next week. You can call my cell phone if any questions team since I am out of office next week.

## 2019-02-26 NOTE — Patient Instructions (Addendum)
Health Maintenance Due  Topic Date Due  . FOOT EXAM - team please complete before e leaves 06/28/2018  . HEMOGLOBIN A1C - today with labs 08/06/2018    Our team is getting you set up with a stat head CT.  If the CT of the head is normal we will proceed with MRI to try to understand the headaches better- I ordered both as stat but usually takes longer to get the MRIs (also ordered as stat) so I still think we should start with the head CT  Team please update caredilol to BID dosing on med list- this is a twice daily drug   Please stop by lab before you go If you do not have mychart- we will call you about results within 5 business days of [place] receiving them.  If you have mychart- we will send your results within 3 business days of [place] receiving them.  If abnormal or we want to clarify a result, we will call or mychart you to make sure you receive the message.  If you have questions or concerns or don't hear within 5-7 days, please send [place] a message or call us.
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
## 2019-02-26 NOTE — Addendum Note (Signed)
Addended by: Marin Olp on: 02/26/2019 04:43 PM   Modules accepted: Orders

## 2019-03-02 ENCOUNTER — Encounter: Payer: Self-pay | Admitting: Family Medicine

## 2019-03-03 LAB — HM DIABETES EYE EXAM

## 2019-03-09 NOTE — Progress Notes (Signed)
Virtual Visit via Video Note The purpose of this virtual visit is to provide medical care while limiting exposure to the novel coronavirus.    Consent was obtained for video visit:  Yes.   Answered questions that patient had about telehealth interaction:  Yes.   I discussed the limitations, risks, security and privacy concerns of performing an evaluation and management service by telemedicine. I also discussed with the patient that there may be a patient responsible charge related to this service. The patient expressed understanding and agreed to proceed.  Pt location: Home Physician Location: office Name of referring provider:  Marin Olp, MD I connected with Bruce [name] at patients initiation/request on 03/10/2019 at  1:50 PM EST by video enabled telemedicine application and verified that I am speaking with the correct person using two identifiers. Pt MRN:  [id number] Pt DOB:  11-04-1950 Video Participants:  Bruce [name]   History of Present Illness:  Bruce [name] is a 69 year old male with HTN, diabetes who presents for headaches.  History supplemented by referring provider note.  2 months ago, he began experiencing headaches that wake him up at night from sleep.  It is a severe diffuse headache, described as sensation of "movement in my head".  It typically lasts about 15 seconds and subsides.  If he falls back asleep, he may be woken up with another one about a couple of hours later.  No associated visual disturbance, nausea, photophobia or phonophobia or unilateral numbness or weakness.  On average, it has occurred 12 to 14 days in a month.  No prior head trauma, illness or prior history of headache.  Of note, the third finger on his right hand would lock up several times a day.  He needs to take his other hand to open it up.  No weakness, numbness or neck pain radiating down the arm.  Current NSAIDS:  none Current analgesics:  none Current triptans:   none Current ergotamine:  none Current anti-emetic:  none Current muscle relaxants:  none Current anti-anxiolytic:  none Current sleep aide:  none Current Antihypertensive medications:  Coreg Current Antidepressant medications:  none Current Anticonvulsant medications:  none Current anti-CGRP:  none  MRI of brain with and without contrast on 02/26/2019 was personally reviewed and demonstrated chronic small vessel ischemic changes with remote lacunar infarcts in the bilateral basal ganglia but no acute intracranial abnormality to explain headache.  Caffeine:  8 to 10 cups of coffee daily Alcohol:  no Smoker:  no Diet:  3 bottles of water daily Exercise:  no Depression:  no; Anxiety:  no Other pain:  no Sleep hygiene:  Otherwise, sleep is okay  02/26/2019 LABS:  B12 809, TSH 3.72, Hgb A1c 5.9, CBC unremarkable except for mildly elevated PLT 420 (baseline), CMP unremarkable except for mildly elevated glucose 107 and low GFR 52.29.   Past Medical History: Past Medical History:  Diagnosis Date  . Anemia   . Arthritis   . Bowel obstruction ([place])   . Chronic headaches   . Colon polyps   . Community acquired pneumonia 03/02/2015  . Crohn's disease ([place])   . Diverticulosis   . DM (diabetes mellitus) ([place])   . Gallstones   . Hemorrhoids   . Hypertension   . Hypothyroidism   . Ileitis   . Insomnia   . Obesity   . Prostate enlargement   . Sleep apnea     Medications: Outpatient Encounter Medications as of  03/10/2019  Medication Sig  . acetaminophen (TYLENOL) 500 MG tablet Take 1,000 mg by mouth every 8 (eight) hours as needed for moderate pain.  Marland Kitchen aspirin 81 MG tablet Take 81 mg by mouth daily.    . carvedilol (COREG) 6.25 MG tablet Take by mouth 2 (two) times daily with a meal.   . cyanocobalamin 2000 MCG tablet Take 2,000 mcg by mouth daily.  . Garlic 9935 MG CAPS Take by mouth.  . levothyroxine (SYNTHROID, LEVOTHROID) 200 MCG tablet TAKE 1 TABLET (200 MCG TOTAL) BY MOUTH  DAILY BEFORE BREAKFAST.  Marland Kitchen losartan (COZAAR) 25 MG tablet Take 1 tablet (25 mg total) by mouth daily. (Patient not taking: Reported on 02/26/2019)  . lovastatin (MEVACOR) 10 MG tablet Take 10 mg by mouth at bedtime.  . mesalamine (PENTASA) 500 MG CR capsule Take 2 capsules (1,000 mg total) by mouth 3 (three) times daily. (Patient not taking: Reported on 02/26/2019)  . Multiple Vitamins-Minerals (ONE-A-DAY MENS HEALTH FORMULA) TABS Take by mouth.  . tadalafil (ADCIRCA/CIALIS) 20 MG tablet Take by mouth.  . tamsulosin (FLOMAX) 0.4 MG CAPS capsule 0.4 mg 2 (two) times daily.   No facility-administered encounter medications on file as of 03/10/2019.    Allergies: Allergies  Allergen Reactions  . Lisinopril Cough  . Coconut Flavor Other (See Comments)    Does not eat because of crohn's     Family History: Family History  Problem Relation Age of Onset  . Colon cancer Mother   . Uterine cancer Mother   . Colon cancer Sister   . Healthy Sister   . Healthy Sister   . Crohn's disease Daughter   . Arthritis Daughter   . Healthy Son   . Brain cancer Father   . Other Daughter        accidental death  . Heart disease Maternal Grandmother   . Bladder Cancer Paternal Grandfather   . Irritable bowel syndrome Sister     Social History: Social History   Socioeconomic History  . Marital status: Widowed    Spouse name: Not on file  . Number of children: 3  . Years of education: Not on file  . Highest education level: Not on file  Occupational History  . Occupation: retired    Fish farm manager: retired  Tobacco Use  . Smoking status: Former Smoker    Packs/day: 4.00    Years: 40.00    Pack years: 160.00    Types: Cigarettes    Quit date: 12/24/2005    Years since quitting: 13.2  . Smokeless tobacco: Never Used  Substance and Sexual Activity  . Alcohol use: No  . Drug use: No  . Sexual activity: Yes  Other Topics Concern  . Not on file  Social History Narrative   Widowed 2018.  2  living children. 1 deceased daughter car wreck. 2 grandkids      Retired from [employer].       [place]ies[place] time   Social Determinants of Health   Financial Resource Strain:   . Difficulty of Paying Living Expenses: Not on file  Food Insecurity:   . Worried About Charity fundraiser in the Last Year: Not on file  . Ran Out of Food in the Last Year: Not on file  Transportation Needs:   . Lack of Transportation (Medical): Not on file  . Lack of Transportation (Non-Medical): Not on file  Physical Activity:   . Days  of Exercise per Week: Not on file  . Minutes of Exercise per Session: Not on file  Stress:   . Feeling of Stress : Not on file  Social Connections:   . Frequency of Communication with Friends and Family: Not on file  . Frequency of Social Gatherings with Friends and Family: Not on file  . Attends Religious Services: Not on file  . Active Member of Clubs or Organizations: Not on file  . Attends Archivist Meetings: Not on file  . Marital Status: Not on file  Intimate Partner Violence:   . Fear of Current or Ex-Partner: Not on file  . Emotionally Abused: Not on file  . Physically Abused: Not on file  . Sexually Abused: Not on file    Observations/Objective:   Height 6' (1.829 m), weight 235 lb (106.6 kg). No acute distress.  Alert and oriented.  Speech fluent and not dysarthric.  Language intact.  Eyes orthophoric on primary gaze.  Face symmetric.  Assessment and Plan:   1.  Suspect hypnic headache.  MRI negative for secondary intracranial abnormality.  Semiology not consistent with temporal arteritis. 2.  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  Recommend following up with Dr. Yong Channel.  1.  I will first have him start melatonin 38m at bedtime.  He will contact me in 6 weeks with update.  If mildly improved, we can increase dose to 141mat bedtime.  If no improvement at all, will  consider starting verapamil. 2.  Follow up in 3 months  Follow Up Instructions:    -I discussed the assessment and treatment plan with the patient. The patient was provided an opportunity to ask questions and all were answered. The patient agreed with the plan and demonstrated an understanding of the instructions.   The patient was advised to call back or seek an in-person evaluation if the symptoms worsen or if the condition fails to improve as anticipated.     AdDudley MajorDO

## 2019-03-10 ENCOUNTER — Encounter: Payer: Self-pay | Admitting: Neurology

## 2019-03-10 ENCOUNTER — Other Ambulatory Visit: Payer: Self-pay

## 2019-03-10 ENCOUNTER — Telehealth (INDEPENDENT_AMBULATORY_CARE_PROVIDER_SITE_OTHER): Payer: Medicare Other | Admitting: Neurology

## 2019-03-10 VITALS — Ht 72.0 in | Wt 235.0 lb

## 2019-03-10 DIAGNOSIS — G4481 Hypnic headache: Secondary | ICD-10-CM | POA: Diagnosis not present

## 2019-03-10 DIAGNOSIS — M6530 Trigger finger, unspecified finger: Secondary | ICD-10-CM

## 2019-03-31 ENCOUNTER — Ambulatory Visit (INDEPENDENT_AMBULATORY_CARE_PROVIDER_SITE_OTHER): Payer: Medicare Other

## 2019-03-31 ENCOUNTER — Other Ambulatory Visit: Payer: Self-pay

## 2019-03-31 VITALS — BP 128/74 | HR 66 | Temp 98.1°F | Ht 72.0 in | Wt 233.0 lb

## 2019-03-31 DIAGNOSIS — Z0000 Encounter for general adult medical examination without abnormal findings: Secondary | ICD-10-CM | POA: Diagnosis not present

## 2019-03-31 NOTE — Patient Instructions (Signed)
[name] , Thank you for taking time to come for your Medicare Wellness Visit. I appreciate your ongoing commitment to your health goals. Please review the following plan we discussed and let me know if I can assist you in the future.   Screening recommendations/referrals: Colorectal Screening: up to date; colonoscopy 03/18/17 with Dr. Hilarie [name]   Vision and Dental Exams: Recommended annual ophthalmology exams for early detection of glaucoma and other disorders of the eye Recommended annual dental exams for proper oral hygiene  Diabetic Exams: Diabetic Eye Exam: recommended yearly Diabetic Foot Exam: recommended yearly; up to date   Vaccinations: Influenza vaccine: completed 12/18/18 Pneumococcal vaccine: up to date; last 06/27/17 Tdap vaccine: up to date; last 04/03/12  Shingles vaccine: Please call your insurance company to determine your out of pocket expense for the Shingrix vaccine. You may receive this vaccine at your local pharmacy. (see attached handouts)  Advanced directives: Please bring a copy of your POA (Power of Attorney) and/or Living Will to your next appointment.  Goals: Recommend to drink at least 6-8 8oz glasses of water per day and consume a balanced diet rich in fresh fruits and vegetables.   Next appointment: Please schedule your Annual Wellness Visit with your Nurse Health Advisor in one year.  Preventive Care 55 Years and Older, Male Preventive care refers to lifestyle choices and visits with your health care provider that can promote health and wellness. What does preventive care include?  A yearly physical exam. This is also called an annual well check.  Dental exams once or twice a year.  Routine eye exams. Ask your health care provider how often you should have your eyes checked.  Personal lifestyle choices, including:  Daily care of your teeth and gums.  Regular physical activity.  Eating a healthy diet.  Avoiding tobacco and drug use.  Limiting  alcohol use.  Practicing safe sex.  Taking low doses of aspirin every day if recommended by your health care provider..  Taking vitamin and mineral supplements as recommended by your health care provider. What happens during an annual well check? The services and screenings done by your health care provider during your annual well check will depend on your age, overall health, lifestyle risk factors, and family history of disease. Counseling  Your health care provider may ask you questions about your:  Alcohol use.  Tobacco use.  Drug use.  Emotional well-being.  Home and relationship well-being.  Sexual activity.  Eating habits.  History of falls.  Memory and ability to understand (cognition).  Work and work Statistician. Screening  You may have the following tests or measurements:  Height, weight, and BMI.  Blood pressure.  Lipid and cholesterol levels. These may be checked every 5 years, or more frequently if you are over 85 years old.  Skin check.  Lung cancer screening. You may have this screening every year starting at age 57 if you have a 30-pack-year history of smoking and currently smoke or have quit within the past 15 years.  Fecal occult blood test (FOBT) of the stool. You may have this test every year starting at age 84.  Flexible sigmoidoscopy or colonoscopy. You may have a sigmoidoscopy every 5 years or a colonoscopy every 10 years starting at age 26.  Prostate cancer screening. Recommendations will vary depending on your family history and other risks.  Hepatitis C blood test.  Hepatitis B blood test.  Sexually transmitted disease (STD) testing.  Diabetes screening. This is done by checking your  blood sugar (glucose) after you have not eaten for a while (fasting). You may have this done every 1-3 years.  Abdominal aortic aneurysm (AAA) screening. You may need this if you are a current or former smoker.  Osteoporosis. You may be screened  starting at age 55 if you are at high risk. Talk with your health care provider about your test results, treatment options, and if necessary, the need for more tests. Vaccines  Your health care provider may recommend certain vaccines, such as:  Influenza vaccine. This is recommended every year.  Tetanus, diphtheria, and acellular pertussis (Tdap, Td) vaccine. You may need a Td booster every 10 years.  Zoster vaccine. You may need this after age 74.  Pneumococcal 13-valent conjugate (PCV13) vaccine. One dose is recommended after age 9.  Pneumococcal polysaccharide (PPSV23) vaccine. One dose is recommended after age 74. Talk to your health care provider about which screenings and vaccines you need and how often you need them. This information is not intended to replace advice given to you by your health care provider. Make sure you discuss any questions you have with your health care provider. Document Released: 03/18/2015 Document Revised: 11/09/2015 Document Reviewed: 12/21/2014 Elsevier Interactive Patient Education  2017 [place] Prevention in the Home Falls can cause injuries. They can happen to people of all ages. There are many things you can do to make your home safe and to help prevent falls. What can I do on the outside of my home?  Regularly fix the edges of walkways and driveways and fix any cracks.  Remove anything that might make you trip as you walk through a door, such as a raised step or threshold.  Trim any bushes or trees on the path to your home.  Use bright outdoor lighting.  Clear any walking paths of anything that might make someone trip, such as rocks or tools.  Regularly check to see if handrails are loose or broken. Make sure that both sides of any steps have handrails.  Any raised decks and porches should have guardrails on the edges.  Have any leaves, snow, or ice cleared regularly.  Use sand or salt on walking paths during  winter.  Clean up any spills in your garage right away. This includes oil or grease spills. What can I do in the bathroom?  Use night lights.  Install grab bars by the toilet and in the tub and shower. Do not use towel bars as grab bars.  Use non-skid mats or decals in the tub or shower.  If you need to sit down in the shower, use a plastic, non-slip stool.  Keep the floor dry. Clean up any water that spills on the floor as soon as it happens.  Remove soap buildup in the tub or shower regularly.  Attach bath mats securely with double-sided non-slip rug tape.  Do not have throw rugs and other things on the floor that can make you trip. What can I do in the bedroom?  Use night lights.  Make sure that you have a light by your bed that is easy to reach.  Do not use any sheets or blankets that are too big for your bed. They should not hang down onto the floor.  Have a firm chair that has side arms. You can use this for support while you get dressed.  Do not have throw rugs and other things on the floor that can make you trip. What can I do in  the kitchen?  Clean up any spills right away.  Avoid walking on wet floors.  Keep items that you use a lot in easy-to-reach places.  If you need to reach something above you, use a strong step stool that has a grab bar.  Keep electrical cords out of the way.  Do not use floor polish or wax that makes floors slippery. If you must use wax, use non-skid floor wax.  Do not have throw rugs and other things on the floor that can make you trip. What can I do with my stairs?  Do not leave any items on the stairs.  Make sure that there are handrails on both sides of the stairs and use them. Fix handrails that are broken or loose. Make sure that handrails are as long as the stairways.  Check any carpeting to make sure that it is firmly attached to the stairs. Fix any carpet that is loose or worn.  Avoid having throw rugs at the top or  bottom of the stairs. If you do have throw rugs, attach them to the floor with carpet tape.  Make sure that you have a light switch at the top of the stairs and the bottom of the stairs. If you do not have them, ask someone to add them for you. What else can I do to help prevent falls?  Wear shoes that:  Do not have high heels.  Have rubber bottoms.  Are comfortable and fit you well.  Are closed at the toe. Do not wear sandals.  If you use a stepladder:  Make sure that it is fully opened. Do not climb a closed stepladder.  Make sure that both sides of the stepladder are locked into place.  Ask someone to hold it for you, if possible.  Clearly mark and make sure that you can see:  Any grab bars or handrails.  First and last steps.  Where the edge of each step is.  Use tools that help you move around (mobility aids) if they are needed. These include:  Canes.  Walkers.  Scooters.  Crutches.  Turn on the lights when you go into a dark area. Replace any light bulbs as soon as they burn out.  Set up your furniture so you have a clear path. Avoid moving your furniture around.  If any of your floors are uneven, fix them.  If there are any pets around you, be aware of where they are.  Review your medicines with your doctor. Some medicines can make you feel dizzy. This can increase your chance of falling. Ask your doctor what other things that you can do to help prevent falls. This information is not intended to replace advice given to you by your health care provider. Make sure you discuss any questions you have with your health care provider. Document Released: 12/16/2008 Document Revised: 07/28/2015 Document Reviewed: 03/26/2014 Elsevier Interactive Patient Education  2017 [employer].

## 2019-03-31 NOTE — Progress Notes (Signed)
Subjective:   Bruce [name] is a 69 y.o. male who presents for an Initial Medicare Annual Wellness Visit.  Review of Systems   Cardiac Risk Factors include: advanced age (>22mn, >>62women);male gender;hypertension;diabetes mellitus;dyslipidemia   Objective:    Today's Vitals   03/31/19 1304  BP: 128/74  Pulse: 66  Temp: 98.1 F (36.7 C)  TempSrc: Temporal  SpO2: 95%  Weight: 233 lb (105.7 kg)  Height: 6' (1.829 m)   Body mass index is 31.6 kg/m.  Advanced Directives 03/31/2019 03/18/2017 11/10/2013 11/10/2013 09/08/2013 08/04/2013 06/28/2013  Does Patient Have a Medical Advance Directive? Yes No Yes No Patient has advance directive, copy not in chart Patient has advance directive, copy not in chart Patient has advance directive, copy not in chart  Type of Advance Directive Living will;Healthcare Power of [place]  Does patient want to make changes to medical advance directive? No - Patient declined - - - - No change requested No change requested  Copy of [place] Chart? No - copy requested - - - - Copy requested from family Copy requested from family  Would patient like information on creating a medical advance directive? - - - No - patient declined information - - -  Pre-existing out of facility DNR order (yellow form or pink MOST form) - - - - No No No    Current Medications (verified) Outpatient Encounter Medications as of 03/31/2019  Medication Sig  . acetaminophen (TYLENOL) 500 MG tablet Take 1,000 mg by mouth every 8 (eight) hours as needed for moderate pain.  .Marland Kitchenaspirin 81 MG tablet Take 81 mg by mouth daily.    . carvedilol (COREG) 6.25 MG tablet Take by mouth 2 (two) times daily with a meal.   . cyanocobalamin 2000 MCG tablet Take 2,000 mcg by mouth daily.  . Garlic 29379MG CAPS Take by mouth.  . levothyroxine  (SYNTHROID, LEVOTHROID) 200 MCG tablet TAKE 1 TABLET (200 MCG TOTAL) BY MOUTH DAILY BEFORE BREAKFAST.  .Marland Kitchenlovastatin (MEVACOR) 10 MG tablet Take 10 mg by mouth at bedtime.  . Multiple Vitamins-Minerals (ONE-A-DAY MENS HEALTH FORMULA) TABS Take by mouth.  . tadalafil (ADCIRCA/CIALIS) 20 MG tablet Take by mouth.  . tamsulosin (FLOMAX) 0.4 MG CAPS capsule 0.4 mg 2 (two) times daily.  . [DISCONTINUED] losartan (COZAAR) 25 MG tablet Take 1 tablet (25 mg total) by mouth daily. (Patient not taking: Reported on 02/26/2019)  . [DISCONTINUED] mesalamine (PENTASA) 500 MG CR capsule Take 2 capsules (1,000 mg total) by mouth 3 (three) times daily. (Patient not taking: Reported on 02/26/2019)   No facility-administered encounter medications on file as of 03/31/2019.    Allergies (verified) Lisinopril and Coconut flavor   History: Past Medical History:  Diagnosis Date  . Anemia   . Arthritis   . Bowel obstruction ([place]   . Chronic headaches   . Colon polyps   . Community acquired pneumonia 03/02/2015  . Crohn's disease ([place]   . Diverticulosis   . DM (diabetes mellitus) ([place]   . Gallstones   . Hemorrhoids   . Hypertension   . Hypothyroidism   . Ileitis   . Insomnia   . Obesity   . Prostate enlargement   . Sleep apnea    Past Surgical History:  Procedure Laterality Date  . BOWEL RESECTION N/A 08/11/2013   x2- one at [place] one  in 2015  . CHOLECYSTECTOMY    . COLONOSCOPY  01/19/2011   Procedure: COLONOSCOPY;  Surgeon: Rogene [place], MD;  [facility];  Service: Endoscopy;  Laterality: N/A;  9:30 am  . INCISION AND DRAINAGE PERIRECTAL ABSCESS N/A 09/08/2013   Procedure: IRRIGATION AND DEBRIDEMENT PERIRECTAL ABSCESS;  Surgeon: Imogene Burn. Georgette [place], MD;  Location: [place];  Service: General;  Laterality: N/A;  . LAPAROTOMY N/A 08/11/2013   Procedure: EXPLORATORY LAPAROTOMY ;  Surgeon: Joyice Faster. [place], MD;  Location: [place];  Service: General;  Laterality: N/A;  . NASAL SINUS SURGERY      . seventh nerve face    . spleenectomy     Punctured after a fall in 1963  . TONSILLECTOMY AND ADENOIDECTOMY     Family History  Problem Relation Age of Onset  . Colon cancer Mother   . Uterine cancer Mother   . Colon cancer Sister   . Healthy Sister   . Healthy Sister   . Crohn's disease Daughter   . Arthritis Daughter   . Healthy Son   . Brain cancer Father   . Other Daughter        accidental death  . Heart disease Maternal Grandmother   . Bladder Cancer Paternal Grandfather   . Irritable bowel syndrome Sister    Social History   Socioeconomic History  . Marital status: Widowed    Spouse name: Not on file  . Number of children: 3  . Years of education: Not on file  . Highest education level: Not on file  Occupational History  . Occupation: retired    Fish farm manager: retired  Tobacco Use  . Smoking status: Former Smoker    Packs/day: 4.00    Years: 40.00    Pack years: 160.00    Types: Cigarettes    Quit date: 12/24/2005    Years since quitting: 13.2  . Smokeless tobacco: Never Used  Substance and Sexual Activity  . Alcohol use: No  . Drug use: No  . Sexual activity: Yes  Other Topics Concern  . Not on file  Social History Narrative   Widowed 2018.  2 living children. 1 deceased daughter car wreck. 2 grandkids      Retired from [employer].       H[place]ies[place] time      Right handed      One story home   Social Determinants of Health   Financial Resource Strain:   . Difficulty of Paying Living Expenses: Not on file  Food Insecurity:   . Worried About Charity fundraiser in the Last Year: Not on file  . Ran Out of Food in the Last Year: Not on file  Transportation Needs:   . Lack of Transportation (Medical): Not on file  . Lack of Transportation (Non-Medical): Not on file  Physical Activity:   . Days of Exercise per Week: Not on file  . Minutes of Exercise per Session: Not on file   Stress:   . Feeling of Stress : Not on file  Social Connections:   . Frequency of Communication with Friends and Family: Not on file  . Frequency of Social Gatherings with Friends and Family: Not on file  . Attends Religious Services: Not on file  . Active Member of Clubs or Organizations: Not on file  . Attends Archivist Meetings: Not on file  . Marital Status: Not on file  Tobacco Counseling Counseling given: Not Answered   Clinical Intake:  Pre-visit preparation completed: Yes  Pain : No/denies pain  Diabetes: Yes(last A1C 5.9) CBG done?: No Did pt. bring in CBG monitor from home?: No  How often do you need to have someone help you when you read instructions, pamphlets, or other written materials from your doctor or pharmacy?: 1 - Never  Interpreter Needed?: No  Information entered by :: Denman George LPN  Activities of Daily Living In your present state of health, do you have any difficulty performing the following activities: 03/31/2019 02/26/2019  Hearing? Tempie Donning  Comment has hearing aids but doesnt wear has hearing aids but doesn't wear  Vision? N N  Difficulty concentrating or making decisions? N N  Walking or climbing stairs? N N  Dressing or bathing? N N  Doing errands, shopping? N N  Preparing Food and eating ? N -  Using the Toilet? N -  In the past six months, have you accidently leaked urine? N -  Do you have problems with loss of bowel control? N -  Managing your Medications? N -  Managing your Finances? N -  Housekeeping or managing your Housekeeping? N -  Some recent data might be hidden     Immunizations and Health Maintenance Immunization History  Administered Date(s) Administered  . Fluad Quad(high Dose 65+) 11/01/2018  . Influenza, High Dose Seasonal PF 12/02/2015, 11/02/2016, 12/12/2017  . Influenza,inj,Quad PF,6+ Mos 01/06/2014, 12/18/2018  . Pneumococcal Conjugate-13 01/21/2014  . Pneumococcal Polysaccharide-23 04/03/2012,  06/27/2017  . Pneumococcal-Unspecified 04/03/2012, 06/27/2017  . Tdap 04/03/2012   Health Maintenance Due  Topic Date Due  . URINE MICROALBUMIN  06/28/2018    Patient Care Team: Marin Olp, MD as PCP - General (Family Medicine) Pyrtle, Lajuan Lines, MD as Consulting Physician (Gastroenterology) Franchot Gallo, MD as Consulting Physician (Urology) Ellis Parents, MD as Consulting Physician (Internal Medicine) Pieter Partridge, DO as Consulting Physician (Neurology)  Indicate any recent Medical Services you may have received from other than [facility] providers in the past year (date may be approximate).    Assessment:   This is a routine wellness examination for Garl.  Hearing/Vision screen No exam data present  Dietary issues and exercise activities discussed: Current Exercise Habits: The patient does not participate in regular exercise at present  Goals   None    Depression Screen PHQ 2/9 Scores 03/31/2019 02/26/2019 02/04/2018 06/27/2017  PHQ - 2 Score 0 0 1 0    Fall Risk Fall Risk  03/31/2019 03/10/2019 02/26/2019 06/27/2017 06/21/2016  Falls in the past year? 0 0 0 No No  Number falls in past yr: 0 0 0 - -  Injury with Fall? 0 0 0 - -  Follow up Falls evaluation completed;Education provided;Falls prevention discussed - - - -    Is the patient's home free of loose throw rugs in walkways, pet beds, electrical cords, etc?   yes      Grab bars in the bathroom? yes      Handrails on the stairs?   yes      Adequate lighting?   yes  Cognitive Function:   Montreal Cognitive Assessment  12/09/2013  Visuospatial/ Executive (0/5) 4  Naming (0/3) 3  Attention: Read list of digits (0/2) 2  Attention: Read list of letters (0/1) 1  Attention: Serial 7 subtraction starting at 100 (0/3) 3  Language: Repeat phrase (0/2) 2  Language : Fluency (0/1) 0  Abstraction (0/2) 2  Delayed Recall (0/5) 1  Orientation (0/6) 5  Total 23  Adjusted Score (based on education) 23   6CIT Screen  03/31/2019  What Year? 0 points  What month? 0 points  What time? 0 points  Count back from 20 0 points  Months in reverse 0 points  Repeat phrase 4 points  Total Score 4    Screening Tests Health Maintenance  Topic Date Due  . URINE MICROALBUMIN  06/28/2018  . OPHTHALMOLOGY EXAM  02/26/2020 (Originally 05/10/2018)  . HEMOGLOBIN A1C  08/27/2019  . FOOT EXAM  02/26/2020  . COLONOSCOPY  03/18/2022  . TETANUS/TDAP  04/03/2022  . INFLUENZA VACCINE  Completed  . Hepatitis C Screening  Completed  . PNA vac Low Risk Adult  Completed    Qualifies for Shingles Vaccine? Discussed and patient will check with pharmacy for coverage.  Patient education handout provided   Cancer Screenings: Lung: Low Dose CT Chest recommended if Age 67-80 years, 30 pack-year currently smoking OR have quit w/in 15years. Patient does not qualify. Colorectal: colonoscopy 03/18/17 with Dr. Hilarie Fredrickson; repeat in 5 years     Plan:  I have personally reviewed and addressed the Medicare Annual Wellness questionnaire and have noted the following in the patient's chart:  A. Medical and social history B. Use of alcohol, tobacco or illicit drugs  C. Current medications and supplements D. Functional ability and status E.  Nutritional status F.  Physical activity G. Advance directives H. List of other physicians I.  Hospitalizations, surgeries, and ER visits in previous 12 months J.  [place] such as hearing and vision if needed, cognitive and depression L. Referrals, records requested, and appointments- none   In addition, I have reviewed and discussed with patient certain preventive protocols, quality metrics, and best practice recommendations. A written personalized care plan for preventive services as well as general preventive health recommendations were provided to patient.   Signed,  Denman George, LPN  Nurse Health Advisor   Nurse Notes: no additional

## 2019-04-21 LAB — HEMOGLOBIN A1C: Hemoglobin A1C: 5.2

## 2019-05-19 ENCOUNTER — Other Ambulatory Visit: Payer: Self-pay | Admitting: Family Medicine

## 2019-06-16 ENCOUNTER — Telehealth: Payer: Self-pay | Admitting: Family Medicine

## 2019-06-16 DIAGNOSIS — Z87891 Personal history of nicotine dependence: Secondary | ICD-10-CM | POA: Diagnosis not present

## 2019-06-16 DIAGNOSIS — M25551 Pain in right hip: Secondary | ICD-10-CM | POA: Diagnosis not present

## 2019-06-16 DIAGNOSIS — I82411 Acute embolism and thrombosis of right femoral vein: Secondary | ICD-10-CM | POA: Diagnosis not present

## 2019-06-16 DIAGNOSIS — M79661 Pain in right lower leg: Secondary | ICD-10-CM | POA: Diagnosis not present

## 2019-06-16 DIAGNOSIS — M25552 Pain in left hip: Secondary | ICD-10-CM | POA: Diagnosis not present

## 2019-06-16 DIAGNOSIS — Z91018 Allergy to other foods: Secondary | ICD-10-CM | POA: Diagnosis not present

## 2019-06-16 DIAGNOSIS — I824Y1 Acute embolism and thrombosis of unspecified deep veins of right proximal lower extremity: Secondary | ICD-10-CM | POA: Diagnosis not present

## 2019-06-16 DIAGNOSIS — Z79899 Other long term (current) drug therapy: Secondary | ICD-10-CM | POA: Diagnosis not present

## 2019-06-16 DIAGNOSIS — M79604 Pain in right leg: Secondary | ICD-10-CM | POA: Diagnosis not present

## 2019-06-16 DIAGNOSIS — I82491 Acute embolism and thrombosis of other specified deep vein of right lower extremity: Secondary | ICD-10-CM | POA: Diagnosis not present

## 2019-06-16 NOTE — Telephone Encounter (Signed)
Nurse Assessment Nurse: Hassell Done, RN, Melanie Date/Time (Eastern Time): 06/16/2019 3:02:38 PM Confirm and document reason for call. If symptomatic, describe symptoms. ---Caller states he has pain across the thigh of his leg. Got his shot of Wynetta Emery and [place] shot last Wed and the pain started on Thursday. Has the patient had close contact with a person known or suspected to have the novel coronavirus illness OR traveled / lives in area with major community spread (including international travel) in the last 14 days from the onset of symptoms? * If Asymptomatic, screen for exposure and travel within the last 14 days. ---No Does the patient have any new or worsening symptoms? ---Yes Will a triage be completed? ---Yes Related visit to physician within the last 2 weeks? ---No Does the PT have any chronic conditions? (i.e. diabetes, asthma, this includes High risk factors for pregnancy, etc.) ---Yes List chronic conditions. ---crohns Is this a behavioral health or substance abuse call? ---No Guidelines Guideline Title Affirmed Question Affirmed Notes Nurse Date/Time (Eastern Time) Leg Pain Patient sounds very sick or weak to the triager Hassell Done, RN, [facility] 06/16/2019 3:05:13 PM Disp. Time (Eastern Time) Disposition Final UserPLEASE NOTE: All timestamps contained within this report are represented as [place] Standard Time. CONFIDENTIALTY NOTICE: This fax transmission is intended only for the addressee. It contains information that is legally privileged, confidential or otherwise protected from use or disclosure. If you are not the intended recipient, you are strictly prohibited from reviewing, disclosing, copying using or disseminating any of this information or taking any action in reliance on or regarding this information. If you have received this fax in error, please notify us immediately by telephone so that we can arrange for its return to [place]. Phone: [phone], Toll-Free:  [phone], Fax: [phone] Page: 2 of 2 Call Id: [id number] 06/16/2019 3:09:12 PM Go to ED Now (or PCP triage) Yes Hassell Done, RN, Melanie Caller Disagree/Comply Comply Caller Understands Yes PreDisposition Call Doctor Care Advice Given Per Guideline GO TO ED NOW (OR PCP TRIAGE): * IF NO PCP (PRIMARY CARE PROVIDER) SECOND-LEVEL TRIAGE: You need to be seen within the next hour. Go to the [facility] at _____________ [place] as soon as you can. CARE ADVICE given per Leg Pain (Adult) guideline. Referrals GO TO FACILITY OTHER - SPECIFY

## 2019-06-19 ENCOUNTER — Telehealth: Payer: Self-pay | Admitting: Family Medicine

## 2019-06-19 NOTE — Telephone Encounter (Signed)
Daughter called stating that her father was taken to [facility] ED for a blood clot in his leg.  States patient has been placed on a blood thinner.  Would like to know if Bruce [name] needs to see patient sooner than 4/23?

## 2019-06-19 NOTE — Telephone Encounter (Signed)
Called and lm on pt daughter vm tcb regarding Dr. Yong Channel message below.

## 2019-06-19 NOTE — Telephone Encounter (Signed)
I am fine with seeing him on the 23rd unless he has particular concerns and wants to see me sooner

## 2019-06-19 NOTE — Telephone Encounter (Signed)
Patient daughter returning phone call

## 2019-06-19 NOTE — Telephone Encounter (Signed)
See below

## 2019-06-22 NOTE — Telephone Encounter (Signed)
Please call to make app

## 2019-06-23 NOTE — Telephone Encounter (Signed)
Lvm for daughter to call back if they need to be seen sooner.

## 2019-06-26 ENCOUNTER — Other Ambulatory Visit: Payer: Self-pay

## 2019-06-26 ENCOUNTER — Encounter: Payer: Self-pay | Admitting: Family Medicine

## 2019-06-26 ENCOUNTER — Ambulatory Visit (INDEPENDENT_AMBULATORY_CARE_PROVIDER_SITE_OTHER): Payer: Medicare Other | Admitting: Family Medicine

## 2019-06-26 VITALS — BP 124/72 | HR 78 | Temp 98.7°F | Ht 72.0 in | Wt 236.0 lb

## 2019-06-26 DIAGNOSIS — E118 Type 2 diabetes mellitus with unspecified complications: Secondary | ICD-10-CM | POA: Diagnosis not present

## 2019-06-26 DIAGNOSIS — I10 Essential (primary) hypertension: Secondary | ICD-10-CM | POA: Diagnosis not present

## 2019-06-26 DIAGNOSIS — E039 Hypothyroidism, unspecified: Secondary | ICD-10-CM | POA: Diagnosis not present

## 2019-06-26 DIAGNOSIS — K921 Melena: Secondary | ICD-10-CM

## 2019-06-26 DIAGNOSIS — I824Y1 Acute embolism and thrombosis of unspecified deep veins of right proximal lower extremity: Secondary | ICD-10-CM | POA: Diagnosis not present

## 2019-06-26 DIAGNOSIS — I82409 Acute embolism and thrombosis of unspecified deep veins of unspecified lower extremity: Secondary | ICD-10-CM | POA: Insufficient documentation

## 2019-06-26 DIAGNOSIS — Z86718 Personal history of other venous thrombosis and embolism: Secondary | ICD-10-CM | POA: Insufficient documentation

## 2019-06-26 HISTORY — DX: Acute embolism and thrombosis of unspecified deep veins of unspecified lower extremity: I82.409

## 2019-06-26 LAB — CBC WITH DIFFERENTIAL/PLATELET
Basophils Absolute: 0.1 10*3/uL (ref 0.0–0.1)
Basophils Relative: 0.8 % (ref 0.0–3.0)
Eosinophils Absolute: 0.5 10*3/uL (ref 0.0–0.7)
Eosinophils Relative: 5.7 % — ABNORMAL HIGH (ref 0.0–5.0)
HCT: 43.3 % (ref 39.0–52.0)
Hemoglobin: 14.6 g/dL (ref 13.0–17.0)
Lymphocytes Relative: 28.7 % (ref 12.0–46.0)
Lymphs Abs: 2.6 10*3/uL (ref 0.7–4.0)
MCHC: 33.8 g/dL (ref 30.0–36.0)
MCV: 89 fl (ref 78.0–100.0)
Monocytes Absolute: 0.9 10*3/uL (ref 0.1–1.0)
Monocytes Relative: 9.8 % (ref 3.0–12.0)
Neutro Abs: 5 10*3/uL (ref 1.4–7.7)
Neutrophils Relative %: 55 % (ref 43.0–77.0)
Platelets: 386 10*3/uL (ref 150.0–400.0)
RBC: 4.86 Mil/uL (ref 4.22–5.81)
RDW: 14.6 % (ref 11.5–15.5)
WBC: 9 10*3/uL (ref 4.0–10.5)

## 2019-06-26 LAB — COMPREHENSIVE METABOLIC PANEL
ALT: 25 U/L (ref 0–53)
AST: 24 U/L (ref 0–37)
Albumin: 4 g/dL (ref 3.5–5.2)
Alkaline Phosphatase: 80 U/L (ref 39–117)
BUN: 13 mg/dL (ref 6–23)
CO2: 28 mEq/L (ref 19–32)
Calcium: 9.2 mg/dL (ref 8.4–10.5)
Chloride: 105 mEq/L (ref 96–112)
Creatinine, Ser: 1.34 mg/dL (ref 0.40–1.50)
GFR: 52.69 mL/min — ABNORMAL LOW (ref >60.00)
Glucose, Bld: 84 mg/dL (ref 70–99)
Potassium: 4.7 mEq/L (ref 3.5–5.1)
Sodium: 140 mEq/L (ref 135–145)
Total Bilirubin: 0.4 mg/dL (ref 0.2–1.2)
Total Protein: 6.8 g/dL (ref 6.0–8.3)

## 2019-06-26 LAB — MICROALBUMIN / CREATININE URINE RATIO
Creatinine,U: 163.6 mg/dL
Microalb Creat Ratio: 13.1 mg/g (ref 0.0–30.0)
Microalb, Ur: 21.4 mg/dL — ABNORMAL HIGH (ref 0.0–1.9)

## 2019-06-26 MED ORDER — RIVAROXABAN (XARELTO) VTE STARTER PACK (15 & 20 MG): ORAL_TABLET | ORAL | 0 refills | Status: DC

## 2019-06-26 MED ORDER — RIVAROXABAN 20 MG PO TABS: 20.0000 mg | ORAL_TABLET | Freq: Every day | ORAL | 4 refills | Status: DC

## 2019-06-26 NOTE — Assessment & Plan Note (Signed)
S:Patient was seen in [place] on 06/16/2019. Diagnosed with DVT in left leg. He took his last dose of Lovenox today. Will start coumadin  tomorrow. Patient has had improvement with symptoms. He denies any pain in area   No recent prolonged travel. Sedentary activity may have contributed with covid though .  Also recently had COVID-19 vaccination with [employer] Ar[employer] day before he had the DVT diagnosed.   From care everywhere venous duplex U/S on 06/16/19 FINDINGS:  RIGHT: CFV: Positive, nonocclusive GSV junction: Positive, nonocclusive DFV: Negative Prox SF: Positive, nonocclusive Mid SF: Negative Dist SF: Negative POP V: Negative PTV:  Negative  IMPRESSION: Positive, nonocclusive thrombus of the right common femoral vein, greater saphenous vein junction, and proximal superficial femoral veins. A/P: Patient diagnosed with nonocclusive DVT 24 hours after [employer] COVID-19 vaccination-we did report this to [facility]. -Patient has been placed on Lovenox and Coumadin-unfortunately does not appear instructions were adequately given-patient had only been taking Lovenox with plan to transition to Coumadin-we discussed how this would actually increase her thrombotic risk -He will take last dose of Lovenox tonight and begin Xarelto starter pack tomorrow.  After start of but given additional 5 months of Xarelto though discussed possibly could stop it 3 months.  We scheduled 32-monthfollow-up -Unfortunately Lovenox patient has had slight blood in his stools.  Fortunately no significant anemia today.  We also went ahead and scheduled a repeat CBC for next week.  I do not think we can go without Xarelto given recent DVT-did encourage patient to consider wet wipes-also would like for him to avoid straining -Patient declines hematology follow-up-I think it is reasonable to consider this provoked with recent vaccination-obviously cannot firmly diagnose vaccination as the cause

## 2019-06-26 NOTE — Patient Instructions (Addendum)
Health Maintenance Due  Topic Date Due  . URINE MICROALBUMIN order pending  06/28/2018   Stay off aspirin  Take the last dose of Lovenox tonight  Tomorrow morning start Xarelto 15 mg twice a day for 3 weeks.  After 3 weeks you will take 20 mg once daily.  I have sent in a starter pack for the first month.  I have also sent in a separate order for Xarelto 20 mg on an ongoing basis-you do not need to pick this up until closer to finishing starter pack.   If you have worsening swelling or leg pain please let us know.  Also please make sure to take this medicine consistently.   If you have increasing volume of blood in stool- we will need to have you go to hospital or if you have chest pain, shortness of breath , abnormal lightheadedness.   Please stop by lab before you go If you have mychart- we will send your results within 3 business days of [place] receiving them.  If you do not have mychart- we will call you about results within 5 business days of [place] receiving them.   Recommended follow up: Return in about 4 months (around 10/26/2019) for follow up- or sooner if needed.   Also schedule another cbc for Monday or Tuesday or wednesday next week

## 2019-06-26 NOTE — Progress Notes (Signed)
Phone [phone] In person visit   Subjective:   Bruce [name] is a 69 y.o. year old very pleasant male patient who presents for/with See problem oriented charting Chief Complaint  Patient presents with  . Diabetes  . Hyperlipidemia  . Hypothyroidism  . DVT    seen at ED on 06/16/2019    This visit occurred during the SARS-CoV-2 public health emergency.  Safety protocols were in place, including screening questions prior to the visit, additional usage of staff PPE, and extensive cleaning of exam room while observing appropriate contact time as indicated for disinfecting solutions.   Past Medical History-  Patient Active Problem List   Diagnosis Date Noted  . Nonischemic cardiomyopathy ([place]) 04/01/2018    Priority: High  . Type II diabetes mellitus with manifestations ([place]) 08/17/2013    Priority: High  . Crohn's ileitis ([place]) 03/17/2012    Priority: High  . Osteoarthritis of left knee 06/21/2016    Priority: Medium  . Former smoker 12/02/2015    Priority: Medium  . Iron deficiency anemia 09/03/2013    Priority: Medium  . B12 deficiency anemia 09/03/2013    Priority: Medium  . Essential hypertension 03/17/2012    Priority: Medium  . Sleep apnea 03/17/2012    Priority: Medium  . Hypothyroidism 03/17/2012    Priority: Medium  . BPH associated with nocturia 03/17/2012    Priority: Medium  . Erectile dysfunction 04/01/2018    Priority: Low  . Aortic atherosclerosis ([place]) 03/30/2017    Priority: Low  . 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 Family history of colon cancer 01/26/2014    Priority: Low  . Constipation 05/20/2012    Priority: Low  . Obesity 03/17/2012    Priority: Low  . Pure hypercholesterolemia 12/31/2018  . GERD (gastroesophageal reflux disease) 04/01/2018  . Hoarseness 06/27/2017  . Onychomycosis 06/21/2016  . Hyperlipidemia 12/17/2012    Medications- reviewed and updated Current Outpatient Medications    Medication Sig Dispense Refill  . acetaminophen (TYLENOL) 500 MG tablet Take 1,000 mg by mouth every 8 (eight) hours as needed for moderate pain.    . carvedilol (COREG) 6.25 MG tablet Take by mouth 2 (two) times daily with a meal.     . cyanocobalamin 2000 MCG tablet Take 2,000 mcg by mouth daily.    . Garlic 9432 MG CAPS Take by mouth.    . levothyroxine (SYNTHROID) 200 MCG tablet TAKE 1 TABLET (200 MCG TOTAL) BY MOUTH DAILY BEFORE BREAKFAST. 90 tablet 3  . lovastatin (MEVACOR) 10 MG tablet Take 10 mg by mouth at bedtime.    . Multiple Vitamins-Minerals (ONE-A-DAY MENS HEALTH FORMULA) TABS Take by mouth.    . polyethylene glycol (MIRALAX / GLYCOLAX) 17 g packet Take 17 g by mouth daily.    . potassium gluconate 595 (99 K) MG TABS tablet Take 595 mg by mouth.    . tadalafil (ADCIRCA/CIALIS) 20 MG tablet Take by mouth.    . tamsulosin (FLOMAX) 0.4 MG CAPS capsule 0.4 mg 2 (two) times daily.    . rivaroxaban (XARELTO) 20 MG TABS tablet Take 1 tablet (20 mg total) by mouth daily with supper. Start after start pack 30 tablet 4  . RIVAROXABAN (XARELTO) VTE STARTER PACK (15 & 20 MG TABLETS) Follow package directions: Take one 19m tablet by mouth twice a day. On day 22, switch to one 218mtablet once a day. Take with food. 51 each 0   No current facility-administered  medications for this visit.     Objective:  BP 124/72   Pulse 78   Temp 98.7 F (37.1 C) (Temporal)   Ht 6' (1.829 m)   Wt 236 lb (107 kg)   SpO2 96%   BMI 32.01 kg/m  Gen: NAD, resting comfortably CV: RRR no murmurs rubs or gallops Lungs: CTAB no crackles, wheeze, rhonchi Abdomen: soft/nontender/nondistended/normal bowel sounds. No rebound or guarding.  Ext: Trace edema on the right, increased calf size on the right without tenderness of calf.  Left calf normal Skin: warm, dry Neuro: Hard of hearing    Assessment and Plan   .#Right DVT  S:Patient was seen in [place] on 06/16/2019. Diagnosed with DVT in left leg. He  took his last dose of Lovenox today. Will start coumadin  tomorrow. Patient has had improvement with symptoms. He denies any pain in area   No recent prolonged travel. Sedentary activity may have contributed with covid though .  Also recently had COVID-19 vaccination with [employer] Ar[facility] day before he had the DVT diagnosed.   From care everywhere venous duplex U/S on 06/16/19 FINDINGS:  RIGHT: CFV: Positive, nonocclusive GSV junction: Positive, nonocclusive DFV: Negative Prox SF: Positive, nonocclusive Mid SF: Negative Dist SF: Negative POP V: Negative PTV:  Negative  IMPRESSION: Positive, nonocclusive thrombus of the right common femoral vein, greater saphenous vein junction, and proximal superficial femoral veins. A/P: Patient diagnosed with nonocclusive DVT 24 hours after [employer] COVID-19 vaccination-we did report this to [facility]. -Patient has been placed on Lovenox and Coumadin-unfortunately does not appear instructions were adequately given-patient had only been taking Lovenox with plan to transition to Coumadin-we discussed how this would actually increase her thrombotic risk -He will take last dose of Lovenox tonight and we opted to transition to Xarelto.  Begin Xarelto starter pack tomorrow.  After start of but given additional 5 months of Xarelto though discussed possibly could stop it 3 months.  We scheduled 60-monthfollow-up -Unfortunately Lovenox patient has had slight blood in his stools.  Fortunately no significant anemia today.  We also went ahead and scheduled a repeat CBC for next week.  I do not think we can go without Xarelto given recent DVT-did encourage patient to consider wet wipes-also would like for him to avoid straining -Patient declines hematology follow-up-I think it is reasonable to consider this provoked with recent vaccination-obviously cannot firmly diagnose vaccination as the cause  #hypertension S: medication: coreg 6.25 mg BID BP Readings from  Last 3 Encounters:  06/26/19 124/72  03/31/19 128/74  02/26/19 132/86  A/P:  Stable. Continue current medications.    #hypothyroidism S: compliant On thyroid medication-levothryoxine 200 mcg  Lab Results  Component Value Date   TSH 3.72 02/26/2019   A/P: Stable. Continue current medications.     #hyperlipidemia S: Medication:lovastatin 10 mg  Lab Results  Component Value Date   CHOL 129 02/26/2019   HDL 40.40 02/26/2019   LDLCALC 66 02/26/2019   LDLDIRECT 82.0 11/02/2016   TRIG 114.0 02/26/2019   CHOLHDL 3 02/26/2019   A/P: Good control/at ideal goal with LDL 70 or less at 66.  Continue current medications.  Recommended follow up: Return in about 4 months (around 10/26/2019) for follow up- or sooner if needed. labs next visit Future Appointments  Date Time Provider DMcKenney 07/06/2019  3:30 PM JMetta ClinesR, DO [facility] None    Lab/Order associations:   ICD-10-CM   1. Type II diabetes mellitus with manifestations ([place]  E11.8 Microalbumin / creatinine urine ratio  2. Essential hypertension  I10   3. Hypothyroidism, unspecified type  E03.9     Meds ordered this encounter  Medications  . RIVAROXABAN (XARELTO) VTE STARTER PACK (15 & 20 MG TABLETS)    Sig: Follow package directions: Take one 37m tablet by mouth twice a day. On day 22, switch to one 252mtablet once a day. Take with food.    Dispense:  51 each    Refill:  0  . rivaroxaban (XARELTO) 20 MG TABS tablet    Sig: Take 1 tablet (20 mg total) by mouth daily with supper. Start after start pack    Dispense:  30 tablet    Refill:  4    Time Spent: 43 minutes of total time (1:50 PM to 2:33 PM) was spent on the date of the encounter performing the following actions: chart review prior to seeing the patient, obtaining history, performing a medically necessary exam, counseling on the treatment plan, placing orders, and documenting in our EHR.   Return precautions advised.  StGarret ReddishMD

## 2019-06-27 NOTE — Progress Notes (Signed)
Notes from Dr. Rock Nephew news-despite the blood in the stool there was no anemia-in fact hemoglobin/blood counts were completely stable from 4 months ago.  I hope that stays the case when we recheck next week  Kidney function mildly depressed.  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   Normal urine diabetes test-No leakage of protein into urine related to diabetes

## 2019-06-29 ENCOUNTER — Other Ambulatory Visit (INDEPENDENT_AMBULATORY_CARE_PROVIDER_SITE_OTHER): Payer: Medicare Other

## 2019-06-29 DIAGNOSIS — N210 Calculus in bladder: Secondary | ICD-10-CM | POA: Diagnosis not present

## 2019-06-29 DIAGNOSIS — R311 Benign essential microscopic hematuria: Secondary | ICD-10-CM | POA: Diagnosis not present

## 2019-06-29 DIAGNOSIS — K921 Melena: Secondary | ICD-10-CM | POA: Diagnosis not present

## 2019-06-30 LAB — CBC
HCT: 43.6 % (ref 39.0–52.0)
Hemoglobin: 14.5 g/dL (ref 13.0–17.0)
MCHC: 33.2 g/dL (ref 30.0–36.0)
MCV: 90.1 fl (ref 78.0–100.0)
Platelets: 406 10*3/uL — ABNORMAL HIGH (ref 150.0–400.0)
RBC: 4.84 Mil/uL (ref 4.22–5.81)
RDW: 14.6 % (ref 11.5–15.5)
WBC: 10 10*3/uL (ref 4.0–10.5)

## 2019-07-02 ENCOUNTER — Encounter: Payer: Self-pay | Admitting: Acute Care

## 2019-07-03 ENCOUNTER — Other Ambulatory Visit: Payer: Self-pay | Admitting: *Deleted

## 2019-07-03 DIAGNOSIS — Z87891 Personal history of nicotine dependence: Secondary | ICD-10-CM

## 2019-07-06 ENCOUNTER — Ambulatory Visit: Payer: Medicare Other | Admitting: Neurology

## 2019-07-06 ENCOUNTER — Encounter: Payer: Self-pay | Admitting: Neurology

## 2019-07-06 ENCOUNTER — Other Ambulatory Visit: Payer: Self-pay

## 2019-07-06 VITALS — BP 137/79 | HR 91 | Resp 20 | Ht 71.0 in | Wt 237.0 lb

## 2019-07-06 DIAGNOSIS — G4481 Hypnic headache: Secondary | ICD-10-CM | POA: Diagnosis not present

## 2019-07-06 NOTE — Patient Instructions (Signed)
1.  Try drinking one cup of strong black coffee every night (or 123m caffeine pill every night) to try and prevent the headaches from waking you up. 2.  Follow up in 5 months.

## 2019-07-06 NOTE — Progress Notes (Signed)
NEUROLOGY FOLLOW UP OFFICE NOTE  Bruce [name] [name] [id number]  HISTORY OF PRESENT ILLNESS: Bruce [name]. 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  UPDATE: Received the [employer] vaccine shot on April 1.  He developed a DVT on April 3 and is now on Eliquis.  He was advised to start melatonin but it caused nightmares.  He has gotten the headache 5 or 6 times over past month.  Current NSAIDS:  none Current analgesics:  none Current triptans:  none Current ergotamine:  none Current anti-emetic:  none Current muscle relaxants:  none Current anti-anxiolytic:  none Current sleep aide:  none Current Antihypertensive medications:  Coreg Current Antidepressant medications:  none Current Anticonvulsant medications:  none Current anti-CGRP:  none  HISTORY: In November 2020 he began experiencing headaches that wake him up at night from sleep.  It is a severe diffuse headache, described as sensation of "movement in my head".  It typically lasts about 15 seconds and subsides.  If he falls back asleep, he may be woken up with another one about a couple of hours later.  No associated visual disturbance, nausea, photophobia or phonophobia or unilateral numbness or weakness.  On average, it has occurred 12 to 14 days in a month.  No prior head trauma, illness or prior history of headache.  Of note, the third finger on his right hand would lock up several times a day.  He needs to take his other hand to open it up.  No weakness, numbness or neck pain radiating down the arm.  MRI of brain with and without contrast on 02/26/2019 was personally reviewed and demonstrated chronic small vessel ischemic changes with remote lacunar infarcts in the bilateral basal ganglia but no acute intracranial abnormality to explain headache.   PAST MEDICAL HISTORY: Past Medical History:  Diagnosis Date  . Anemia    . Arthritis   . Bowel obstruction ([place])   . Chronic headaches   . Colon polyps   . Community acquired pneumonia 03/02/2015  . Crohn's disease ([place])   . Diverticulosis   . DM (diabetes mellitus) ([place])   . Gallstones   . Hemorrhoids   . Hypertension   . Hypothyroidism   . Ileitis   . Insomnia   . Obesity   . Prostate enlargement   . Sleep apnea     MEDICATIONS: Current Outpatient Medications on File Prior to Visit  Medication Sig Dispense Refill  . acetaminophen (TYLENOL) 500 MG tablet Take 1,000 mg by mouth every 8 (eight) hours as needed for moderate pain.    . carvedilol (COREG) 6.25 MG tablet Take by mouth 2 (two) times daily with a meal.     . cyanocobalamin 2000 MCG tablet Take 2,000 mcg by mouth daily.    . Garlic 1607 MG CAPS Take by mouth.    . levothyroxine (SYNTHROID) 200 MCG tablet TAKE 1 TABLET (200 MCG TOTAL) BY MOUTH DAILY BEFORE BREAKFAST. 90 tablet 3  . lovastatin (MEVACOR) 10 MG tablet Take 10 mg by mouth at bedtime.    . Multiple Vitamins-Minerals (ONE-A-DAY MENS HEALTH FORMULA) TABS Take by mouth.    . polyethylene glycol (MIRALAX / GLYCOLAX) 17 g packet Take 17 g by mouth daily.    . potassium gluconate 595 (99 K) MG TABS tablet Take 595 mg by mouth.    . rivaroxaban (XARELTO) 20 MG TABS tablet Take 1  tablet (20 mg total) by mouth daily with supper. Start after start pack 30 tablet 4  . RIVAROXABAN (XARELTO) VTE STARTER PACK (15 & 20 MG TABLETS) Follow package directions: Take one 59m tablet by mouth twice a day. On day 22, switch to one 210mtablet once a day. Take with food. 51 each 0  . tadalafil (ADCIRCA/CIALIS) 20 MG tablet Take by mouth.    . tamsulosin (FLOMAX) 0.4 MG CAPS capsule 0.4 mg 2 (two) times daily.     No current facility-administered medications on file prior to visit.    ALLERGIES: Allergies  Allergen Reactions  . Lisinopril Cough  . Coconut Flavor Other (See Comments)    Does not eat because of crohn's     FAMILY HISTORY:  Family History  Problem Relation Age of Onset  . Colon cancer Mother   . Uterine cancer Mother   . Colon cancer Sister   . Healthy Sister   . Healthy Sister   . Crohn's disease Daughter   . Arthritis Daughter   . Healthy Son   . Brain cancer Father   . Other Daughter        accidental death  . Heart disease Maternal Grandmother   . Bladder Cancer Paternal Grandfather   . Irritable bowel syndrome Sister    SOCIAL HISTORY: Social History   Socioeconomic History  . Marital status: Widowed    Spouse name: Not on file  . Number of children: 3  . Years of education: Not on file  . Highest education level: Not on file  Occupational History  . Occupation: retired    EmFish farm managerretired  Tobacco Use  . Smoking status: Former Smoker    Packs/day: 4.00    Years: 40.00    Pack years: 160.00    Types: Cigarettes    Quit date: 12/24/2005    Years since quitting: 13.5  . Smokeless tobacco: Never Used  Substance and Sexual Activity  . Alcohol use: No  . Drug use: No  . Sexual activity: Yes  Other Topics Concern  . Not on file  Social History Narrative   Widowed 2018.  2 living children. 1 deceased daughter car wreck. 2 grandkids      Retired from [employer]      [place]ies[place] time      Right handed      One story home   Social Determinants of Health   Financial Resource Strain:   . Difficulty of Paying Living Expenses:   Food Insecurity:   . Worried About RuCharity fundraisern the Last Year:   . RaArboriculturistn the Last Year:   Transportation Needs:   . LaFilm/video editorMedical):   . Marland Kitchenack of Transportation (Non-Medical):   Physical Activity:   . Days of Exercise per Week:   . Minutes of Exercise per Session:   Stress:   . Feeling of Stress :   Social Connections:   . Frequency of Communication with Friends and Family:   . Frequency of Social Gatherings with Friends and Family:   .  Attends Religious Services:   . Active Member of Clubs or Organizations:   . Attends ClArchivisteetings:   . Marland Kitchenarital Status:   Intimate Partner Violence:   . Fear of Current or Ex-Partner:   . Emotionally Abused:   . Marland Kitchenhysically Abused:   . Sexually Abused:  REVIEW OF SYSTEMS: Constitutional: No fevers, chills, or sweats, no generalized fatigue, change in appetite Eyes: No visual changes, double vision, eye pain Ear, nose and throat: No hearing loss, ear pain, nasal congestion, sore throat Cardiovascular: No chest pain, palpitations Respiratory:  No shortness of breath at rest or with exertion, wheezes GastrointestinaI: No nausea, vomiting, diarrhea, abdominal pain, fecal incontinence Genitourinary:  No dysuria, urinary retention or frequency Musculoskeletal:  No neck pain, back pain Integumentary: No rash, pruritus, skin lesions Neurological: as above Psychiatric: No depression, insomnia, anxiety Endocrine: No palpitations, fatigue, diaphoresis, mood swings, change in appetite, change in weight, increased thirst Hematologic/Lymphatic:  No purpura, petechiae. Allergic/Immunologic: no itchy/runny eyes, nasal congestion, recent allergic reactions, rashes  PHYSICAL EXAM: Blood pressure 137/79, pulse 91, resp. rate 20, height 5' 11"  (1.803 m), weight 237 lb (107.5 kg), SpO2 95 %. General: No acute distress.  Patient appears well-groomed.   Head:  Normocephalic/atraumatic Eyes:  Fundi examined but not visualized Neck: supple, no paraspinal tenderness, full range of motion Heart:  Regular rate and rhythm Lungs:  Clear to auscultation bilaterally Back: No paraspinal tenderness Neurological Exam: alert and oriented to person, place, and time. Attention span and concentration intact, recent and remote memory intact, fund of knowledge intact.  Speech fluent and not dysarthric, language intact.  Right sided upper and lower facial palsy; decreased hearing.  Otherwise, CN II-XII  intact. Bulk and tone normal, muscle strength 5/5 throughout.  Sensation to light touch, temperature and vibration intact.  Deep tendon reflexes 2+ upper extremities and absent lower extremities; toes downgoing.  Finger to nose and heel to shin testing intact.  Gait normal, Romberg negative.  IMPRESSION: Hypnic headaches  PLAN: 1.  He will try drinking strong cup of coffee/caffeine 170m every evening.  If ineffective, we can try verapamil. 2.  Follow up in 5 months.  AMetta Clines DO  CC: SGarret Reddish MD

## 2019-07-09 ENCOUNTER — Other Ambulatory Visit: Payer: Self-pay

## 2019-07-09 ENCOUNTER — Ambulatory Visit (INDEPENDENT_AMBULATORY_CARE_PROVIDER_SITE_OTHER)
Admission: RE | Admit: 2019-07-09 | Discharge: 2019-07-09 | Disposition: A | Discharge status: Home / Self Care | Payer: Medicare Other | Source: Ambulatory Visit | Attending: Acute Care | Admitting: Acute Care

## 2019-07-09 DIAGNOSIS — Z87891 Personal history of nicotine dependence: Secondary | ICD-10-CM | POA: Diagnosis not present

## 2019-07-14 NOTE — Progress Notes (Signed)
Please call patient and let them  know their  low dose Ct was read as a Lung RADS 2: nodules that are benign in appearance and behavior with a very low likelihood of becoming a clinically active cancer due to size or lack of growth. Recommendation per radiology is for a repeat LDCT in 12 months. .Please let them  know we will order and schedule their  annual screening scan for 07/2020. Please let them  know there was notation of CAD on their  scan.  Please remind the patient  that this is a non-gated exam therefore degree or severity of disease  cannot be determined. Please have them  follow up with their PCP regarding potential risk factor modification, dietary therapy or pharmacologic therapy if clinically indicated. Pt.  is  currently on statin therapy. Please place order for annual  screening scan for  07/2020 and fax results to PCP. Thanks so much.

## 2019-07-16 ENCOUNTER — Other Ambulatory Visit: Payer: Self-pay | Admitting: *Deleted

## 2019-07-16 DIAGNOSIS — Z87891 Personal history of nicotine dependence: Secondary | ICD-10-CM

## 2019-07-20 ENCOUNTER — Other Ambulatory Visit: Payer: Self-pay | Admitting: Family Medicine

## 2019-07-20 NOTE — Telephone Encounter (Signed)
Should be 49m once daily since he finished the initial 3 weeks of 15 mg twice daily

## 2019-07-20 NOTE — Telephone Encounter (Signed)
Dr. Yong Channel, please verify dosage of Xarelto? One tablet daily or BID?

## 2019-07-21 ENCOUNTER — Encounter: Payer: Self-pay | Admitting: Family Medicine

## 2019-07-21 NOTE — Telephone Encounter (Signed)
LVM for patient to call back and schedule

## 2019-07-22 NOTE — Progress Notes (Signed)
Phone [phone] In person visit   Subjective:   Bruce [name] is a 69 y.o. year old very pleasant male patient who presents for/with See problem oriented charting Chief Complaint  Patient presents with  . Back Pain    x 1 week    This visit occurred during the SARS-CoV-2 public health emergency.  Safety protocols were in place, including screening questions prior to the visit, additional usage of staff PPE, and extensive cleaning of exam room while observing appropriate contact time as indicated for disinfecting solutions.   Past Medical History-  Patient Active Problem List   Diagnosis Date Noted  . Nonischemic cardiomyopathy ([place]) 04/01/2018    Priority: High  . Type II diabetes mellitus with manifestations ([place]) 08/17/2013    Priority: High  . Crohn's ileitis ([place]) 03/17/2012    Priority: High  . Osteoarthritis of left knee 06/21/2016    Priority: Medium  . Former smoker 12/02/2015    Priority: Medium  . Iron deficiency anemia 09/03/2013    Priority: Medium  . B12 deficiency anemia 09/03/2013    Priority: Medium  . Hyperlipidemia 12/17/2012    Priority: Medium  . Essential hypertension 03/17/2012    Priority: Medium  . Sleep apnea 03/17/2012    Priority: Medium  . Hypothyroidism 03/17/2012    Priority: Medium  . BPH associated with nocturia 03/17/2012    Priority: Medium  . Erectile dysfunction 04/01/2018    Priority: Low  . GERD (gastroesophageal reflux disease) 04/01/2018    Priority: Low  . Hoarseness 06/27/2017    Priority: Low  . Aortic atherosclerosis ([place]) 03/30/2017    Priority: Low  . Onychomycosis 06/21/2016    Priority: Low  . 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 Family history of colon cancer 01/26/2014    Priority: Low  . Constipation 05/20/2012    Priority: Low  . Obesity 03/17/2012    Priority: Low  . DVT (deep venous thrombosis) ([place]) 06/26/2019    Medications- reviewed and updated Current  Outpatient Medications  Medication Sig Dispense Refill  . acetaminophen (TYLENOL) 500 MG tablet Take 1,000 mg by mouth every 8 (eight) hours as needed for moderate pain.    . carvedilol (COREG) 6.25 MG tablet Take by mouth 2 (two) times daily with a meal.     . cyanocobalamin 2000 MCG tablet Take 2,000 mcg by mouth daily.    . Garlic 3903 MG CAPS Take by mouth.    . levothyroxine (SYNTHROID) 200 MCG tablet TAKE 1 TABLET (200 MCG TOTAL) BY MOUTH DAILY BEFORE BREAKFAST. 90 tablet 3  . lovastatin (MEVACOR) 10 MG tablet Take 10 mg by mouth at bedtime.    . Multiple Vitamins-Minerals (ONE-A-DAY MENS HEALTH FORMULA) TABS Take by mouth.    . polyethylene glycol (MIRALAX / GLYCOLAX) 17 g packet Take 17 g by mouth daily.    . potassium gluconate 595 (99 K) MG TABS tablet Take 595 mg by mouth.    . rivaroxaban (XARELTO) 20 MG TABS tablet Take 1 tablet (20 mg total) by mouth daily with supper. 90 tablet 1  . RIVAROXABAN (XARELTO) VTE STARTER PACK (15 & 20 MG TABLETS) Follow package directions: Take one 42m tablet by mouth twice a day. On day 22, switch to one 241mtablet once a day. Take with food. 51 each 0  . tadalafil (ADCIRCA/CIALIS) 20 MG tablet Take by mouth.    . tamsulosin (FLOMAX) 0.4 MG CAPS capsule 0.4 mg 2 (two) times  daily.     No current facility-administered medications for this visit.     Objective:  BP 136/84   Pulse 97   Temp 97.7 F (36.5 C) (Temporal)   Ht 5' 11"  (1.803 m)   Wt 237 lb 3.2 oz (107.6 kg)   SpO2 96%   BMI 33.08 kg/m  Gen: NAD, resting comfortably CV: RRR no murmurs rubs or gallops Lungs: CTAB no crackles, wheeze, rhonchi Abdomen: soft/nontender/nondistended/normal bowel sounds. No rebound or guarding.  Ext: no edema Skin: warm, dry Back - Normal skin, Spine with normal alignment and no deformity.  No tenderness to vertebral process palpation.  Paraspinous muscles are very tender and with spasm in lower thoracic spine. Negative Straight leg raise.  Neuro- no  saddle anesthesia, 5/5 strength lower extremities Rectal: normal tone, diffusely enlarged prostate, no masses or tenderness    Results for orders placed or performed in visit on 07/24/19 (from the past 24 hour(s))  POCT Glucose (CBG)     Status: Abnormal   Collection Time: 07/24/19  3:18 PM  Result Value Ref Range   POC Glucose 120 (A) 70 - 99 mg/dl  POCT Urinalysis Dipstick (Automated)     Status: Abnormal   Collection Time: 07/24/19  3:22 PM  Result Value Ref Range   Color, UA golden    Clarity, UA clear    Glucose, UA Negative Negative   Bilirubin, UA Positive    Ketones, UA Negative    Spec Grav, UA 1.025 1.010 - 1.025   Blood, UA Negative    pH, UA 6.0 5.0 - 8.0   Protein, UA Positive (A) Negative   Urobilinogen, UA 0.2 0.2 or 1.0 E.U./dL   Nitrite, UA Negative    Leukocytes, UA Negative Negative       Assessment and Plan  #Mid back Pain bilateral S: Pain rating, quality, Location- pain now 2/10 but at night can get to 9.5/10 and described as ache.  Started, duration-started after CT 5/6- had to get up without assist- but increased in the last 5 days.  Worse with (sitting-related to herniated disc compression)-better in daytime than nighttime- better with laying flat as well but if rolls on sides hurts worth.   Aggravated by sitting.  Relieved by- lying flat on back.  Previous Treatment- Tylenol, heat- helps 40-45 mins, position changing.  Previous imaging- not recently -thoracic spine films April 26, 2014-degenerative changes were noted  Nocturia every 2 hours or so. Sitting hes ok but getting up he has to urinate urgently. Daytime frequency as well though position related. Does not check blood sugars.   Follows with [facility]- last visit a month agao- they did say prostate was large.  ROS-No saddle anesthesia, bladder incontinence, fecal incontinence, weakness in extremity, numbness or tingling in extremity. History negative for trauma, history of cancer,  fever, chills, unintentional weight loss, recent bacterial infection, recent IV drug use, HIV, pain worse at night (position related only) or while supine.  A/P: 69 year old male with thoracic spine films February 2016 showing degenerative changes of thoracic spine presenting with lower thoracic spine pain and muscle spasm in this area.  No red flags today.  I think he may have tweaked his back when he had a hard time getting up from recent CT scan-we opted to try a course of Flexeril particularly bedtime since he seems to feel better in the daytime-he knows to follow-up if not improving within several weeks -Patient also having some polyuria/nocturia-may be related to BPH.  No  obvious UTI on initial urinalysis-we will get urine culture to make sure no infection. -BPH on exam but nontender nonboggy prostate-doubt prostatitis -Blood sugar appears normal at 120-unlikely to be causing frequent urination -History of Crohn's ileitis but no recent blood in stool or worsening abdominal pain reported.  #hypertension S: medication: carvedilol 6.25 mg BID BP Readings from Last 3 Encounters:  07/24/19 136/84  07/06/19 137/79  06/26/19 124/72  A/P: Initial blood pressure elevated-controlled on repeat  #hypothyroidism S: compliant On thyroid medication-levothyroxine 200 mcg Lab Results  Component Value Date   TSH 3.72 02/26/2019   A/P: Poorly controlled hypothyroidism can be associated with back pain-last TSH was normal and he is compliant with medication-I do not think this is contributing  Recommended follow up: We will keep August follow-up-can see each other sooner if needed Future Appointments  Date Time Provider [place]  10/27/2019  4:00 PM Marin Olp, MD [name]  12/07/2019  3:30 PM Pieter Partridge, DO [facility] None    Lab/Order associations:   ICD-10-CM   1. Polyuria  R35.8 POCT Urinalysis Dipstick (Automated)    Urine Culture    POCT Glucose (CBG)  2. Essential  hypertension  I10   3. BPH associated with nocturia  N40.1    R35.1   4. Hypothyroidism, unspecified type  E03.9     Meds ordered this encounter  Medications  . tiZANidine (ZANAFLEX) 2 MG tablet    Sig: Take 1 tablet (2 mg total) by mouth every 8 (eight) hours as needed for muscle spasms (do not drive for 8 hours after taking).    Dispense:  20 tablet    Refill:  0   Return precautions advised.  Garret Reddish, MD

## 2019-07-24 ENCOUNTER — Ambulatory Visit (INDEPENDENT_AMBULATORY_CARE_PROVIDER_SITE_OTHER): Payer: Medicare Other | Admitting: Family Medicine

## 2019-07-24 ENCOUNTER — Encounter: Payer: Self-pay | Admitting: Family Medicine

## 2019-07-24 ENCOUNTER — Other Ambulatory Visit: Payer: Self-pay

## 2019-07-24 VITALS — BP 136/84 | HR 97 | Temp 97.7°F | Ht 71.0 in | Wt 237.2 lb

## 2019-07-24 DIAGNOSIS — E039 Hypothyroidism, unspecified: Secondary | ICD-10-CM | POA: Diagnosis not present

## 2019-07-24 DIAGNOSIS — R358 Other polyuria: Secondary | ICD-10-CM

## 2019-07-24 DIAGNOSIS — R3589 Other polyuria: Secondary | ICD-10-CM

## 2019-07-24 DIAGNOSIS — M546 Pain in thoracic spine: Secondary | ICD-10-CM | POA: Diagnosis not present

## 2019-07-24 DIAGNOSIS — I10 Essential (primary) hypertension: Secondary | ICD-10-CM | POA: Diagnosis not present

## 2019-07-24 DIAGNOSIS — R351 Nocturia: Secondary | ICD-10-CM

## 2019-07-24 DIAGNOSIS — N401 Enlarged prostate with lower urinary tract symptoms: Secondary | ICD-10-CM

## 2019-07-24 LAB — POC URINALSYSI DIPSTICK (AUTOMATED)
Bilirubin, UA: POSITIVE
Blood, UA: NEGATIVE
Glucose, UA: NEGATIVE
Ketones, UA: NEGATIVE
Leukocytes, UA: NEGATIVE
Nitrite, UA: NEGATIVE
Protein, UA: POSITIVE — AB
Spec Grav, UA: 1.025 (ref 1.010–1.025)
Urobilinogen, UA: 0.2 E.U./dL
pH, UA: 6 (ref 5.0–8.0)

## 2019-07-24 LAB — GLUCOSE, POCT (MANUAL RESULT ENTRY): POC Glucose: 120 mg/dl — AB (ref 70–99)

## 2019-07-24 MED ORDER — TIZANIDINE HCL 2 MG PO TABS: 2.0000 mg | ORAL_TABLET | Freq: Three times a day (TID) | ORAL | 0 refills | Status: DC | PRN

## 2019-07-24 NOTE — Patient Instructions (Addendum)
Continue heat since that seems to be helpful  Tried tizanidine muscle relaxant before bed-also okay to use in the daytime as long as you are not going to be driving for 8 hours.  If you have new or worsening symptoms please let me know or if you have symptoms like leg weakness or incontinence  I suspect you will be improving within 2 to 3 weeks-if not please let us know as well.  Please stop by lab before you go If you have mychart- we will send your results within 3 business days of [place] receiving them.  If you do not have mychart- we will call you about results within 5 business days of [place] receiving them.

## 2019-07-25 LAB — URINE CULTURE
MICRO NUMBER:: 10506238
Result:: NO GROWTH
SPECIMEN QUALITY:: ADEQUATE

## 2019-08-05 ENCOUNTER — Encounter: Payer: Self-pay | Admitting: Family Medicine

## 2019-08-24 ENCOUNTER — Ambulatory Visit (INDEPENDENT_AMBULATORY_CARE_PROVIDER_SITE_OTHER): Payer: Medicare Other

## 2019-08-24 ENCOUNTER — Other Ambulatory Visit: Payer: Self-pay

## 2019-08-24 ENCOUNTER — Ambulatory Visit (INDEPENDENT_AMBULATORY_CARE_PROVIDER_SITE_OTHER): Payer: Medicare Other | Admitting: Family

## 2019-08-24 VITALS — BP 140/80 | HR 93 | Temp 98.4°F | Ht 71.0 in | Wt 235.6 lb

## 2019-08-24 DIAGNOSIS — M25561 Pain in right knee: Secondary | ICD-10-CM

## 2019-08-24 DIAGNOSIS — M79672 Pain in left foot: Secondary | ICD-10-CM

## 2019-08-24 DIAGNOSIS — M7989 Other specified soft tissue disorders: Secondary | ICD-10-CM | POA: Diagnosis not present

## 2019-08-24 DIAGNOSIS — S92515A Nondisplaced fracture of proximal phalanx of left lesser toe(s), initial encounter for closed fracture: Secondary | ICD-10-CM | POA: Diagnosis not present

## 2019-08-24 NOTE — Progress Notes (Signed)
Bruce [name] is a 69 y.o. male with the following history as recorded in EpicCare:  Patient Active Problem List   Diagnosis Date Noted  . DVT (deep venous thrombosis) ([place]) 06/26/2019  . Nonischemic cardiomyopathy ([place]) 04/01/2018  . Erectile dysfunction 04/01/2018  . GERD (gastroesophageal reflux disease) 04/01/2018  . Hoarseness 06/27/2017  . Aortic atherosclerosis ([place]) 03/30/2017  . Osteoarthritis of left knee 06/21/2016  . Onychomycosis 06/21/2016  . Former smoker 12/02/2015  . 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 Family history of colon cancer 01/26/2014  . Iron deficiency anemia 09/03/2013  . B12 deficiency anemia 09/03/2013  . Type II diabetes mellitus with manifestations ([place]) 08/17/2013  . Hyperlipidemia 12/17/2012  . Constipation 05/20/2012  . Essential hypertension 03/17/2012  . Sleep apnea 03/17/2012  . Hypothyroidism 03/17/2012  . Crohn's ileitis ([place]) 03/17/2012  . Obesity 03/17/2012  . BPH associated with nocturia 03/17/2012    Current Outpatient Medications  Medication Sig Dispense Refill  . acetaminophen (TYLENOL) 500 MG tablet Take 1,000 mg by mouth every 8 (eight) hours as needed for moderate pain.    . carvedilol (COREG) 6.25 MG tablet Take by mouth 2 (two) times daily with a meal.     . cyanocobalamin 2000 MCG tablet Take 2,000 mcg by mouth daily.    . Garlic 7672 MG CAPS Take by mouth.    . levothyroxine (SYNTHROID) 200 MCG tablet TAKE 1 TABLET (200 MCG TOTAL) BY MOUTH DAILY BEFORE BREAKFAST. 90 tablet 3  . lovastatin (MEVACOR) 10 MG tablet Take 10 mg by mouth at bedtime.    . Multiple Vitamins-Minerals (ONE-A-DAY MENS HEALTH FORMULA) TABS Take by mouth.    . polyethylene glycol (MIRALAX / GLYCOLAX) 17 g packet Take 17 g by mouth daily.    . potassium gluconate 595 (99 K) MG TABS tablet Take 595 mg by mouth.    . rivaroxaban (XARELTO) 20 MG TABS tablet Take 1 tablet (20 mg total) by mouth daily with supper. 90 tablet 1  .  RIVAROXABAN (XARELTO) VTE STARTER PACK (15 & 20 MG TABLETS) Follow package directions: Take one 92m tablet by mouth twice a day. On day 22, switch to one 253mtablet once a day. Take with food. 51 each 0  . tadalafil (ADCIRCA/CIALIS) 20 MG tablet Take by mouth.    . tamsulosin (FLOMAX) 0.4 MG CAPS capsule 0.4 mg 2 (two) times daily.    . Marland KitcheniZANidine (ZANAFLEX) 2 MG tablet Take 1 tablet (2 mg total) by mouth every 8 (eight) hours as needed for muscle spasms (do not drive for 8 hours after taking). 20 tablet 0   No current facility-administered medications for this visit.    Allergies: Lisinopril and Coconut flavor  Past Medical History:  Diagnosis Date  . Anemia   . Arthritis   . Bowel obstruction ([place]  . Chronic headaches   . Colon polyps   . Community acquired pneumonia 03/02/2015  . Crohn's disease ([facility]  . Diverticulosis   . DM (diabetes mellitus) ([place]  . Gallstones   . Hemorrhoids   . Hypertension   . Hypothyroidism   . Ileitis   . Insomnia   . Obesity   . Prostate enlargement   . Sleep apnea     Past Surgical History:  Procedure Laterality Date  . BOWEL RESECTION N/A 08/11/2013   x2- one at [place] one in 2015  . CHOLECYSTECTOMY    . COLONOSCOPY  01/19/2011   Procedure: COLONOSCOPY;  Surgeon: NaMechele Dawley  [name], MD;  [facility];  Service: Endoscopy;  Laterality: N/A;  9:30 am  . INCISION AND DRAINAGE PERIRECTAL ABSCESS N/A 09/08/2013   Procedure: IRRIGATION AND DEBRIDEMENT PERIRECTAL ABSCESS;  Surgeon: Imogene Burn. Georgette Dover, MD;  Location: [facility];  Service: General;  Laterality: N/A;  . LAPAROTOMY N/A 08/11/2013   Procedure: EXPLORATORY LAPAROTOMY ;  Surgeon: Joyice Faster. Cornett, MD;  Location: [facility];  Service: General;  Laterality: N/A;  . NASAL SINUS SURGERY    . seventh nerve face    . spleenectomy     Punctured after a fall in 1963  . TONSILLECTOMY AND ADENOIDECTOMY      Family History  Problem Relation Age of Onset  . Colon cancer Mother   . Uterine cancer  Mother   . Colon cancer Sister   . Healthy Sister   . Healthy Sister   . Crohn's disease Daughter   . Arthritis Daughter   . Healthy Son   . Brain cancer Father   . Other Daughter        accidental death  . Heart disease Maternal Grandmother   . Bladder Cancer Paternal Grandfather   . Irritable bowel syndrome Sister     Social History   Tobacco Use  . Smoking status: Former Smoker    Packs/day: 4.00    Years: 40.00    Pack years: 160.00    Types: Cigarettes    Quit date: 12/24/2005    Years since quitting: 13.6  . Smokeless tobacco: Never Used  Substance Use Topics  . Alcohol use: No    Subjective:  Patient is here today for acute care visit- his PCP was not available today. Accompanied by his son who helps provide the history; Patient was at [employer] and tripped and fell- right knee was scraped and bruised during fall; they have been icing knee and feel that swelling is improved but were concerned due to use of Xarelto; asking for X-ray; Also mentions that he hit his left toe on a foot locker while at camp and is concerned that he broke his 5th toe left foot;  Has been taking Tylenol for symptom relief- comfortable with continuing this treatment plan.    Objective:  Vitals:   08/24/19 1359  BP: 140/80  Pulse: 93  Temp: 98.4 F (36.9 C)  TempSrc: Oral  SpO2: 95%  Weight: 235 lb 9.6 oz (106.9 kg)  Height: 5' 11"  (1.803 m)    General: Well developed, well nourished, in no acute distress  Skin : Warm and dry.  Head: Normocephalic and atraumatic  Lungs: Respirations unlabored;  Musculoskeletal: No deformities; bruising and swelling noted over right knee; marked bruising/ discoloration of 5th toe right foot/ some bruising noted on top of foot Extremities: No edema, cyanosis, clubbing  Vessels: Symmetric bilaterally  Neurologic: Alert and oriented; speech intact; face symmetrical; moves all extremities well; CNII-XII intact without focal deficit   Assessment:   1. Left foot pain   2. Acute pain of right knee     Plan:  1. Suspect he did break his toe but want to make sure the foot is not affected as well; update X-ray today and follow up to be determined; 2. Update X-ray; continue to ice and elevate as discuss; continue Tylenol as well;  This visit occurred during the SARS-CoV-2 public health emergency.  Safety protocols were in place, including screening questions prior to the visit, additional usage of staff PPE, and extensive cleaning of exam room while  observing appropriate contact time as indicated for disinfecting solutions.     No follow-ups on file.  Orders Placed This Encounter  Procedures  . DG Foot Complete Left    Standing Status:   Future    Number of Occurrences:   1    Standing Expiration Date:   08/23/2020    Order Specific Question:   Reason for Exam (SYMPTOM  OR DIAGNOSIS REQUIRED)    Answer:   left toe injury/ evaluate for break    Order Specific Question:   Preferred imaging location?    Answer:   [facility]    Order Specific Question:   Radiology Contrast Protocol - do NOT remove file path    Answer:   \\[email or web address].[email or web address]  . DG Knee Complete 4 Views Right    Standing Status:   Future    Number of Occurrences:   1    Standing Expiration Date:   08/23/2020    Order Specific Question:   Reason for Exam (SYMPTOM  OR DIAGNOSIS REQUIRED)    Answer:   right knee pain    Order Specific Question:   Preferred imaging location?    Answer:   [facility]    Order Specific Question:   Radiology Contrast Protocol - do NOT remove file path    Answer:   \\[email or web address]    Requested Prescriptions    No prescriptions requested or ordered in this encounter

## 2019-08-31 ENCOUNTER — Telehealth: Payer: Self-pay | Admitting: *Deleted

## 2019-08-31 DIAGNOSIS — S8001XA Contusion of right knee, initial encounter: Secondary | ICD-10-CM | POA: Diagnosis not present

## 2019-08-31 DIAGNOSIS — M1711 Unilateral primary osteoarthritis, right knee: Secondary | ICD-10-CM | POA: Diagnosis not present

## 2019-08-31 DIAGNOSIS — S92515A Nondisplaced fracture of proximal phalanx of left lesser toe(s), initial encounter for closed fracture: Secondary | ICD-10-CM | POA: Diagnosis not present

## 2019-08-31 DIAGNOSIS — R52 Pain, unspecified: Secondary | ICD-10-CM | POA: Diagnosis not present

## 2019-08-31 DIAGNOSIS — M79661 Pain in right lower leg: Secondary | ICD-10-CM | POA: Diagnosis not present

## 2019-08-31 NOTE — Telephone Encounter (Signed)
[  10:34 AM] Gerome Apley  hey can you fax office notes for Bruce [name] June 03, 1950. He saw Mickel Baas on 08/24/2019 and [facility] needs the office notes today before his appointment and its  at 3:30. There fax number is [phone], attn. Annye Asa

## 2019-08-31 NOTE — Telephone Encounter (Signed)
Tilman Neat office notes & xray from 08/24/19.Marland KitchenJohny Chess

## 2019-09-14 DIAGNOSIS — S92515D Nondisplaced fracture of proximal phalanx of left lesser toe(s), subsequent encounter for fracture with routine healing: Secondary | ICD-10-CM | POA: Diagnosis not present

## 2019-09-14 DIAGNOSIS — S8001XA Contusion of right knee, initial encounter: Secondary | ICD-10-CM | POA: Diagnosis not present

## 2019-09-15 ENCOUNTER — Ambulatory Visit: Payer: Medicare Other | Admitting: Urology

## 2019-09-26 DIAGNOSIS — E119 Type 2 diabetes mellitus without complications: Secondary | ICD-10-CM | POA: Diagnosis not present

## 2019-09-26 DIAGNOSIS — H40033 Anatomical narrow angle, bilateral: Secondary | ICD-10-CM | POA: Diagnosis not present

## 2019-09-29 ENCOUNTER — Encounter: Payer: Self-pay | Admitting: Urology

## 2019-09-29 ENCOUNTER — Other Ambulatory Visit: Payer: Self-pay

## 2019-09-29 ENCOUNTER — Ambulatory Visit: Payer: Medicare Other | Admitting: Urology

## 2019-09-29 VITALS — BP 159/99 | HR 79 | Ht 71.0 in | Wt 231.0 lb

## 2019-09-29 DIAGNOSIS — N401 Enlarged prostate with lower urinary tract symptoms: Secondary | ICD-10-CM

## 2019-09-29 DIAGNOSIS — R972 Elevated prostate specific antigen [PSA]: Secondary | ICD-10-CM

## 2019-09-29 DIAGNOSIS — N3281 Overactive bladder: Secondary | ICD-10-CM | POA: Diagnosis not present

## 2019-09-29 DIAGNOSIS — R351 Nocturia: Secondary | ICD-10-CM | POA: Diagnosis not present

## 2019-09-29 LAB — BLADDER SCAN AMB NON-IMAGING: Scan Result: 106

## 2019-09-29 NOTE — Patient Instructions (Signed)
1. Cut back on coffee to less than 2-3 cups per day 2.  Minimize fluid intake after 8 PM, and urinate twice before going to bed 3.  Monitor how your urination improves over the next few weeks with these above strategies, if still bothered by urgency and frequency with urination can try the 50 mg Myrbetriq samples. 4.  Follow-up with Bruce [name] in 6 weeks for symptom check, can consider HOLEP in the future if symptoms not improving  Holmium Laser Enucleation of the Prostate (HoLEP)  HoLEP is a treatment for men with benign prostatic hyperplasia (BPH). The laser surgery removed blockages of urine flow, and is done without any incisions on the body.     What is HoLEP?  HoLEP is a type of laser surgery used to treat obstruction (blockage) of urine flow as a result of benign prostatic hyperplasia (BPH). In men with BPH, the prostate gland is not cancerous, but has become enlarged. An enlarged prostate can result in a number of urinary tract symptoms such as weak urinary stream, difficulty in starting urination, inability to urinate, frequent urination, or getting up at night to urinate.  HoLEP was developed in the 1990's as a more effective and less expensive surgical option for BPH, compared to other surgical options such as laser vaporization(PVP/greenlight laser), transurethral resection of the prostate(TURP), and open simple prostatectomy.   What happens during a HoLEP?  HoLEP requires general anesthesia ("asleep" throughout the procedure).   An antibiotic is given to reduce the risk of infection  A surgical instrument called a resectoscope is inserted through the urethra (the tube that carries urine from the bladder). The resectoscope has a camera that allows the surgeon to view the internal structure of the prostate gland, and to see where the incisions are being made during surgery.  The laser is inserted into the resectoscope and is used to enucleate (free up) the enlarged prostate  tissue from the capsule (outer shell) and then to seal up any blood vessels. The tissue that has been removed is pushed back into the bladder.  A morcellator is placed through the resectoscope, and is used to suction out the prostate tissue that has been pushed into the bladder.  When the prostate tissue has been removed, the resectoscope is removed, and a foley catheter is placed to allow healing and drain the urine from the bladder.     What happens after a HoLEP?  More than 90% of patients go home the same day a few hours after surgery. Less than 10% will be admitted to the hospital overnight for observation to monitor the urine, or if they have other medical problems.  Fluid is flushed through the catheter for about 1 hour after surgery to clear any blood from the urine. It is normal to have some blood in the urine after surgery. The need for blood transfusion is extremely rare.  Eating and drinking are permitted after the procedure once the patient has fully awakened from anesthesia.  The catheter is usually removed 2-3 days after surgery- the patient will come to clinic to have the catheter removed and make sure they can urinate on their own.  It is very important to drink lots of fluids after surgery for one week to keep the bladder flushed.  At first, there may be some burning with urination, but this typically improved within a few hours to days. Most patients do not have a significant amount of pain, and narcotic pain medications are rarely needed.  Symptoms of urinary frequency, urgency, and even leakage are NORMAL for the first few weeks after surgery as the bladder adjusts after having to work hard against blockage from the prostate for many years. This will improve, but can sometimes take several months.  The use of pelvic floor exercises (Kegel exercises) can help improve problems with urinary incontinence.   After catheter removal, patients will be seen at 6 weeks and 6  months for symptom check  No heavy lifting for at least 2-3 weeks after surgery, however patients can walk and do light activities the first day after surgery. Return to work time depends on occupation.    What are the advantages of HoLEP?  HoLEP has been studied in many different parts of the world and has been shown to be a safe and effective procedure. Although there are many types of BPH surgeries available, HoLEP offers a unique advantage in being able to remove a large amount of tissue without any incisions on the body, even in very large prostates, while decreasing the risk of bleeding and providing tissue for pathology (to look for cancer). This decreases the need for blood transfusions during surgery, minimizes hospital stay, and reduces the risk of needing repeat treatment.  What are the side effects of HoLEP?  Temporary burning and bleeding during urination. Some blood may be seen in the urine for weeks after surgery and is part of the healing process.  Urinary incontinence (inability to control urine flow) is expected in all patients immediately after surgery and they should wear pads for the first few days/weeks. This typically improves over the course of several weeks. Performing Kegel exercises can help decrease leakage from stress maneuvers such as coughing, sneezing, or lifting. The rate of long term leakage is very low. Patients may also have leakage with urgency and this may be treated with medication. The risk of urge incontinence can be dependent on several factors including age, prostate size, symptoms, and other medical problems.  Retrograde ejaculation or "backwards ejaculation." In 75% of cases, the patient will not see any fluid during ejaculation after surgery.  Erectile function is generally not significantly affected.   What are the risks of HoLEP?  Injury to the urethra or development of scar tissue at a later date  Injury to the capsule of the prostate  (typically treated with longer catheterization).  Injury to the bladder or ureteral orifices (where the urine from the kidney drains out)  Infection of the bladder, testes, or kidneys  Return of urinary obstruction at a later date requiring another operation (<2%)  Need for blood transfusion or re-operation due to bleeding  Failure to relieve all symptoms and/or need for prolonged catheterization after surgery  5-15% of patients are found to have previously undiagnosed prostate cancer in their specimen. Prostate cancer can be treated after HoLEP.  Standard risks of anesthesia including blood clots, heart attacks, etc  When should I call my doctor?  Fever over 101.3 degrees  Inability to urinate, or large blood clots in the urine

## 2019-09-29 NOTE — Progress Notes (Signed)
09/29/19 3:31 PM   [name] [birth year] [phone]  CC: BPH/OAB   HPI: I saw Mr. Nicole Kindred in urology clinic today for the above issues.  He is regularly followed by Dr. Diona Fanti in [place], but was referred for consideration of a HOLEP regarding his urinary symptoms.  He also recently had a DVT in April 2021, and is currently anticoagulated with Xarelto for another 6 weeks.  He recently underwent a microscopic hematuria work-up with Dr. Diona Fanti where CT showed a 180 g prostate, and cystoscopy was notable for some visual obstruction and friable prostatic tissue.  Prior CT in 2015 prostate measured 80 g.  He has been on Flomax long-term.  At some point, it sounds like he was on 25 mg of Myrbetriq, but unclear if this improved his urinary symptoms.  His primary complaint today is urinary urgency, frequency, and occasional urge incontinence, as well as nocturia.  Notably, he drinks at least 10 cups of coffee today, including a full cup of black coffee right before bed because "A neurosurgeon told him to."  He denies any gross hematuria, history of urinary retention, or history of UTIs.  He has a long history of elevated PSAs which have fluctuated significantly, he has a history of a negative biopsy, and PSA is most likely elevated secondary to his significantly enlarged prostate.  IPSS score today in clinic is 21, with quality of life unhappy.  PVR is normal at 100 mL. PMH: Past Medical History:  Diagnosis Date  . Anemia   . Arthritis   . Bowel obstruction ([place])   . Chronic headaches   . Colon polyps   . Community acquired pneumonia 03/02/2015  . Crohn's disease ([place])   . Diverticulosis   . DM (diabetes mellitus) ([place])   . Gallstones   . Hemorrhoids   . Hypertension   . Hypothyroidism   . Ileitis   . Insomnia   . Obesity   . Prostate enlargement   . Sleep apnea     Surgical History: Past Surgical History:  Procedure Laterality Date  . BOWEL RESECTION N/A 08/11/2013    x2- one at [place] one in 2015  . CHOLECYSTECTOMY    . COLONOSCOPY  01/19/2011   Procedure: COLONOSCOPY;  Surgeon: Rogene Houston, MD;  [facility];  Service: Endoscopy;  Laterality: N/A;  9:30 am  . INCISION AND DRAINAGE PERIRECTAL ABSCESS N/A 09/08/2013   Procedure: IRRIGATION AND DEBRIDEMENT PERIRECTAL ABSCESS;  Surgeon: Imogene Burn. Georgette Dover, MD;  Location: [facility];  Service: General;  Laterality: N/A;  . LAPAROTOMY N/A 08/11/2013   Procedure: EXPLORATORY LAPAROTOMY ;  Surgeon: Joyice Faster. Cornett, MD;  Location: [facility];  Service: General;  Laterality: N/A;  . NASAL SINUS SURGERY    . seventh nerve face    . spleenectomy     Punctured after a fall in 1963  . TONSILLECTOMY AND ADENOIDECTOMY      Family History: Family History  Problem Relation Age of Onset  . Colon cancer Mother   . Uterine cancer Mother   . Colon cancer Sister   . Healthy Sister   . Healthy Sister   . Crohn's disease Daughter   . Arthritis Daughter   . Healthy Son   . Brain cancer Father   . Other Daughter        accidental death  . Heart disease Maternal Grandmother   . Bladder Cancer Paternal Grandfather   . Irritable bowel syndrome Sister     Social  History:  reports that he quit smoking about 13 years ago. His smoking use included cigarettes. He has a 160.00 pack-year smoking history. He has never used smokeless tobacco. He reports that he does not drink alcohol and does not use drugs.  Physical Exam: BP (!) 159/99   Pulse 79   Ht 5' 11"  (1.803 m)   Wt (!) 231 lb (104.8 kg)   BMI 32.22 kg/m    Constitutional:  Alert and oriented, No acute distress. Cardiovascular: No clubbing, cyanosis, or edema. Respiratory: Normal respiratory effort, no increased work of breathing. GI: Abdomen is soft, nontender, nondistended, no abdominal masses   Assessment & Plan:   In summary, is a 69 year old male with a reportedly 180 g prostate on outside CT and bothersome urinary symptoms primarily of urgency  and frequency.  He also drinks 10 cups of coffee a day including right before bed.  We discussed the risks and benefits of HoLEP at length.  The procedure requires general anesthesia and takes 2 to 3 hours, and a holmium laser is used to enucleate the prostate and push this tissue into the bladder.  A morcellator is then used to remove this tissue, which is sent for pathology.  The vast majority of patients are able to discharge the same day with a catheter in place for 2 to 3 days, and will follow-up in clinic for a voiding trial.  Approximately 5% of patients will be admitted overnight to monitor the urine, or if they have multiple co-morbidities.  We specifically discussed the risks of bleeding, infection, retrograde ejaculation, temporary urgency and urge incontinence, very low risk of long-term incontinence, pathologic evaluation of prostate tissue and possible detection of prostate cancer or other malignancy, and possible need for additional procedures.  I had a very frank conversation with the patient and his son that I am not 100% convinced he has BPH and outlet obstruction, and his bothersome urinary symptoms of urgency and frequency may simply be related to his very high volume of coffee intake.  I recommended cutting back on coffee to see how his urinary symptoms respond prior to undergoing any definitive surgical procedure.  We also discussed he would need to wait at least 6 weeks to be off of anticoagulation prior to undergoing surgery anyway, so I do not think we lose anything by working on behavioral strategies at this time.  I also gave him samples of 50 mg Myrbetriq to try in the next few weeks if he does not notice any improvement in his urination by cutting back on his coffee intake.  I am happy to see him in the future to re-discuss HOLEP, but they requested to follow-up in [place] which is much closer to home.  With his significant overactive symptoms and high coffee intake, I would  recommend completing urodynamics prior to undergoing a definitive outlet procedure like HoLEP, as we could significantly worsen his overactive symptoms with an outlet procedure  Nickolas Madrid, MD 09/29/2019  [facility] [address] [place], [place] [address] [phone]

## 2019-10-27 ENCOUNTER — Ambulatory Visit (INDEPENDENT_AMBULATORY_CARE_PROVIDER_SITE_OTHER): Payer: Medicare Other | Admitting: Family Medicine

## 2019-10-27 ENCOUNTER — Other Ambulatory Visit: Payer: Self-pay

## 2019-10-27 ENCOUNTER — Encounter: Payer: Self-pay | Admitting: Family Medicine

## 2019-10-27 VITALS — BP 118/80 | HR 98 | Temp 98.1°F | Ht 71.0 in | Wt 229.1 lb

## 2019-10-27 DIAGNOSIS — I700 Atherosclerosis of aorta: Secondary | ICD-10-CM

## 2019-10-27 DIAGNOSIS — E118 Type 2 diabetes mellitus with unspecified complications: Secondary | ICD-10-CM | POA: Diagnosis not present

## 2019-10-27 DIAGNOSIS — J439 Emphysema, unspecified: Secondary | ICD-10-CM | POA: Diagnosis not present

## 2019-10-27 DIAGNOSIS — I824Y1 Acute embolism and thrombosis of unspecified deep veins of right proximal lower extremity: Secondary | ICD-10-CM

## 2019-10-27 DIAGNOSIS — K50012 Crohn's disease of small intestine with intestinal obstruction: Secondary | ICD-10-CM

## 2019-10-27 DIAGNOSIS — R5383 Other fatigue: Secondary | ICD-10-CM

## 2019-10-27 DIAGNOSIS — D519 Vitamin B12 deficiency anemia, unspecified: Secondary | ICD-10-CM

## 2019-10-27 DIAGNOSIS — E039 Hypothyroidism, unspecified: Secondary | ICD-10-CM

## 2019-10-27 DIAGNOSIS — M791 Myalgia, unspecified site: Secondary | ICD-10-CM

## 2019-10-27 DIAGNOSIS — I351 Nonrheumatic aortic (valve) insufficiency: Secondary | ICD-10-CM

## 2019-10-27 DIAGNOSIS — I428 Other cardiomyopathies: Secondary | ICD-10-CM

## 2019-10-27 DIAGNOSIS — T466X5A Adverse effect of antihyperlipidemic and antiarteriosclerotic drugs, initial encounter: Secondary | ICD-10-CM | POA: Insufficient documentation

## 2019-10-27 DIAGNOSIS — D509 Iron deficiency anemia, unspecified: Secondary | ICD-10-CM

## 2019-10-27 HISTORY — DX: Emphysema, unspecified: J43.9

## 2019-10-27 MED ORDER — LEVOTHYROXINE SODIUM 200 MCG PO TABS
200.0000 ug | ORAL_TABLET | Freq: Every day | ORAL | 3 refills | Status: DC
Start: 2019-10-27 — End: 2020-12-21

## 2019-10-27 MED ORDER — ESCITALOPRAM OXALATE 10 MG PO TABS: 10.0000 mg | ORAL_TABLET | Freq: Every day | ORAL | 5 refills | Status: DC

## 2019-10-27 NOTE — Assessment & Plan Note (Signed)
Noted on CT chest lung cancer screening program.  Former smoker.  He denies significant symptoms-may get fatigued with activity some more now than he used to

## 2019-10-27 NOTE — Progress Notes (Signed)
Phone [phone] In person visit   Subjective:   Bruce [name] is a 69 y.o. year old very pleasant male patient who presents for/with See problem oriented charting Chief Complaint  Patient presents with  . Follow-up   This visit occurred during the SARS-CoV-2 public health emergency.  Safety protocols were in place, including screening questions prior to the visit, additional usage of staff PPE, and extensive cleaning of exam room while observing appropriate contact time as indicated for disinfecting solutions.   Past Medical History-  Patient Active Problem List   Diagnosis Date Noted  . DVT (deep venous thrombosis) ([place]) 06/26/2019    Priority: High  . Nonischemic cardiomyopathy ([place]) 04/01/2018    Priority: High  . Type II diabetes mellitus with manifestations ([place]) 08/17/2013    Priority: High  . Crohn's ileitis ([place]) 03/17/2012    Priority: High  . Aortic atherosclerosis ([place]) 03/30/2017    Priority: Medium  . Osteoarthritis of left knee 06/21/2016    Priority: Medium  . Former smoker 12/02/2015    Priority: Medium  . Iron deficiency anemia 09/03/2013    Priority: Medium  . B12 deficiency anemia 09/03/2013    Priority: Medium  . Hyperlipidemia 12/17/2012    Priority: Medium  . Essential hypertension 03/17/2012    Priority: Medium  . Sleep apnea 03/17/2012    Priority: Medium  . Hypothyroidism 03/17/2012    Priority: Medium  . BPH associated with nocturia 03/17/2012    Priority: Medium  . Erectile dysfunction 04/01/2018    Priority: Low  . GERD (gastroesophageal reflux disease) 04/01/2018    Priority: Low  . Hoarseness 06/27/2017    Priority: Low  . Onychomycosis 06/21/2016    Priority: Low  . 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 Family history of colon cancer 01/26/2014    Priority: Low  . Constipation 05/20/2012    Priority: Low  . Obesity 03/17/2012    Priority: Low  . Emphysema lung ([place]) 10/27/2019     Medications- reviewed and updated Current Outpatient Medications  Medication Sig Dispense Refill  . carvedilol (COREG) 6.25 MG tablet Take by mouth 2 (two) times daily with a meal.     . Garlic 2876 MG CAPS Take by mouth.    . Multiple Vitamins-Minerals (ONE-A-DAY MENS HEALTH FORMULA) TABS Take by mouth.    . polyethylene glycol (MIRALAX / GLYCOLAX) 17 g packet Take 17 g by mouth daily.    . potassium gluconate 595 (99 K) MG TABS tablet Take 595 mg by mouth.    . rivaroxaban (XARELTO) 20 MG TABS tablet Take 1 tablet (20 mg total) by mouth daily with supper. 90 tablet 1  . tamsulosin (FLOMAX) 0.4 MG CAPS capsule 0.4 mg 2 (two) times daily.    . cyanocobalamin 2000 MCG tablet Take 2,000 mcg by mouth daily. (Patient not taking: Reported on 10/27/2019)    . escitalopram (LEXAPRO) 10 MG tablet Take 1 tablet (10 mg total) by mouth daily. 30 tablet 5  . levothyroxine (SYNTHROID) 200 MCG tablet Take 1 tablet (200 mcg total) by mouth daily before breakfast. 90 tablet 3   No current facility-administered medications for this visit.     Objective:  BP 118/80   Pulse 98   Temp 98.1 F (36.7 C) (Temporal)   Ht 5' 11"  (1.803 m)   Wt 229 lb 1.6 oz (103.9 kg)   SpO2 96%   BMI 31.95 kg/m  Gen: NAD, resting comfortably CV: RRR  no murmurs rubs or gallops Lungs: CTAB no crackles, wheeze, rhonchi Ext: no edema or calf pain Skin: warm, dry     Assessment and Plan    #Provoked DVT S: Patient developed right leg nonobstructive DVT 24 hours after [employer] COVID-19 vaccination starting June 16 2019-he has been on anticoagulation since that time-3 months of therapy through September 15, 2019 now has completed over 4 months of therapy and has at least 2 weeks of medication left  Compliant with Xarelto 20 mg daily A/P: Considering this was a provoked DVT most likely in relation to [employer] vaccination-I believe it is reasonable to stop Xarelto after 2 additional weeks which would be  almost 5 months of therapy.  If patient has recurrence of symptoms to let us know immediately. -Patient also needs clearance to be off of this for prostate surgery-if they happen to need to do surgery prior to the 2 weeks-I am fine with him stopping at this point   # Depression S: Medication: none  Noted for 2-3 months very low motivation and feeling really down and depressed.  No history of depression.   Enjoys building but things are really expensive right now and not going down.   Taking b12 for history low b12 Depression screen [facility] 2/9 10/27/2019 03/31/2019 02/26/2019  Decreased Interest 3 0 0  Down, Depressed, Hopeless 3 0 0  PHQ - 2 Score 6 0 0  Altered sleeping 0 - -  Tired, decreased energy 3 - -  Change in appetite 0 - -  Feeling bad or failure about yourself  3 - -  Trouble concentrating 3 - -  Moving slowly or fidgety/restless 0 - -  Suicidal thoughts 0 - -  PHQ-9 Score 15 - -  Difficult doing work/chores Somewhat difficult - -  A/P: Patient with new onset of depression.  Start Lexapro 10 mg-start with 5 mg for the first 2 weeks and then increase to 10 mg.  Patient also interested in counseling-gave information for [facility].  I also think we need to update blood work before starting these treatments to make sure there is nothing organic cause of his symptoms-we discussed possibilities such as anemia or undertreated hypothyroidism  #hypothyroidism S: compliant On thyroid medication-levothyroxine   285mg Lab Results  Component Value Date   TSH 3.72 02/26/2019    A/P: Hopefully controlled-update TSH when he comes back for labs tomorrow  #hyperlipidemia/aortic atherosclerosis/coronary calcim/myalgia due to statin S: Medication:  Stopped lovastatin 10 mg daily as was having a lot of stiffness and trouble getting out of bed in morning-  Lab Results  Component Value Date   CHOL 129 02/26/2019   HDL 40.40 02/26/2019   LDLCALC 66 02/26/2019   LDLDIRECT 82.0  11/02/2016   TRIG 114.0 02/26/2019   CHOLHDL 3 02/26/2019   A/P: Patient's lipids were reasonably controlled at last check but he may have been on lovastatin at that time-has not been able to tolerate due to muscle stiffness.  May try alternate statin such as rosuvastatin 5 to 10 mg once a week-really want LDL to at least be below 70 due to aortic atherosclerosis history as well as coronary calcium noted. -Also consider starting aspirin when he is off of Eliquis -Of note patient did have memory loss and myalgias on atorvastatin 20 mg once a week and if this recurred on rosuvastatin 5 mg weekly would likely permanently discontinue efforts for statin  # Diabetes-A1c to 7.3 in 2015 but later  lost weight S: Medication: None Lab Results  Component Value Date   HGBA1C 5.2 04/21/2019   HGBA1C 5.9 02/26/2019   HGBA1C 5.4 02/04/2018   A/P: Excellent control last check about medication-update A1c with labs    #Calcifications aortic valve-reviewed prior echocardiogram had mild aortic regurgitation-recommended updating echocardiogram to evaluate both of these-patient in agreement  #Anemia-patient previously with iron deficiency anemia noted-we will check ferritin levels as he states stopped iron.  One of the chart it looks like iron was stopped before colonoscopy February 05, 2017.  We will check ferritin levels as well as CBC with labs tomorrow  Iron/TIBC/Ferritin/ %Sat    Component Value Date/Time   IRON 67 01/25/2014 1323   FERRITIN 317.5 11/02/2016 1511   IRONPCTSAT 17.9 (L) 01/25/2014 1323   #Nonischemic cardiomyopathy-ejection fraction 45 to 50% in the past June 18, 2012.  It appears patient previously was on ACE inhibitor-losartan was stopped.  Carvedilol was later started-has been followed by cardiology in the past.  Largely asymptomatic.  Followed by cardiology with [facility]  #Crohn's disease-patient not currently on treatment but denies any symptoms such as abdominal pain or blood in  stool.  Remain off medicine for now unless becomes symptomatic -Review chart after visit appears less communication with GI was by phone call in 2019-reasonable to encourage follow-up with GI  #B12 deficiency-patient on oral B12.  Update B12 level with labs  Recommended follow up: Return in about 4 months (around 02/26/2020). Future Appointments  Date Time Provider [place]  10/28/2019 11:15 AM [name]  11/10/2019  2:00 PM Franchot Gallo, MD [facility] None  12/07/2019  3:30 PM Pieter Partridge, DO [facility] None    Lab/Order associations:   ICD-10-CM   1. Aortic atherosclerosis (HCC)  I70.0   2. Type II diabetes mellitus with manifestations (HCC)  E11.8 CBC With Differential/Platelet    COMPLETE METABOLIC PANEL WITH GFR    Hemoglobin A1c    Lipid Panel (Refl)  3. Acute deep vein thrombosis (DVT) of proximal vein of right lower extremity (HCC)  I82.4Y1   4. Hypothyroidism, unspecified type  E03.9 TSH  5. Anemia due to vitamin B12 deficiency, unspecified B12 deficiency type  D51.9 Vitamin B12  6. Fatigue, unspecified type  R53.83 VITAMIN D 25 Hydroxy (Vit-D Deficiency, Fractures)  7. Aortic valve insufficiency, etiology of cardiac valve disease unspecified  I35.1 ECHOCARDIOGRAM COMPLETE  8. Iron deficiency anemia, unspecified iron deficiency anemia type  D50.9 Iron, TIBC and Ferritin Panel  9. Pulmonary emphysema, unspecified emphysema type ([place])  J43.9     Meds ordered this encounter  Medications  . levothyroxine (SYNTHROID) 200 MCG tablet    Sig: Take 1 tablet (200 mcg total) by mouth daily before breakfast.    Dispense:  90 tablet    Refill:  3  . escitalopram (LEXAPRO) 10 MG tablet    Sig: Take 1 tablet (10 mg total) by mouth daily.    Dispense:  30 tablet    Refill:  5   Return precautions advised.  Garret Reddish, MD

## 2019-10-27 NOTE — Patient Instructions (Addendum)
Health Maintenance Due  Topic Date Due  . INFLUENZA VACCINE  - will complete later in flu season (please let us know if you get this at another location so we can update your chart) . We should have vaccination here in 1-2 months - can call back for an appointment.   10/04/2019   Okay to stop Xarelto after you finish the bottle-I am also okay with going ahead and stopping if they can schedule your prostate surgery sooner  We will call you within two weeks about your referral to echocardiogram (based off chest ct possible calcium on aorta plus prior aortic regurgitation/backflow). If you do not hear within 3 weeks, give [place] a call.   Schedule a lab visit at the check out desk within 2 weeks. Return for future fasting labs meaning nothing but water after midnight please. Ok to take your medications with water.   For depression (as long as labs come back normal) please start Lexapro 10 mg-for the first 2 weeks only take 1/2 tablet.  If you are tolerating the medicine in 2 weeks may increase to the full 10 mg.  Lets follow-up in 6 to 8 weeks to recheck  I also want you to start therapy/counseling:  Please call [phone] to schedule a visit with [facility] -Trey Paula is an excellent counselor who is based out of our clinic. Can also see one of the other therapists/counselors that has sooner availability.    Taking the medicine as directed and not missing any doses is one of the best things you can do to treat your depression.  Here are some things to keep in mind:  1) Side effects (stomach upset, some increased anxiety) may happen before you notice a benefit.  These side effects typically go away over time. 2) Changes to your dose of medicine or a change in medication all together is sometimes necessary 3) Most people need to be on medication at least 6-12 months 4) Many people will notice an improvement within two weeks but the full effect of the medication can take up to 46-8  weeks 5) Stopping the medication when you start feeling better often results in a return of symptoms 6) If you start having thoughts of hurting yourself or others after starting this medicine, call our office immediately at [phone] or seek care through 911.     Recommended follow up: Return in about 4 months (around 02/26/2020).

## 2019-10-28 ENCOUNTER — Other Ambulatory Visit: Payer: Medicare Other

## 2019-10-28 DIAGNOSIS — E118 Type 2 diabetes mellitus with unspecified complications: Secondary | ICD-10-CM | POA: Diagnosis not present

## 2019-10-28 DIAGNOSIS — R5383 Other fatigue: Secondary | ICD-10-CM

## 2019-10-28 DIAGNOSIS — E559 Vitamin D deficiency, unspecified: Secondary | ICD-10-CM | POA: Diagnosis not present

## 2019-10-28 DIAGNOSIS — D519 Vitamin B12 deficiency anemia, unspecified: Secondary | ICD-10-CM | POA: Diagnosis not present

## 2019-10-28 DIAGNOSIS — D509 Iron deficiency anemia, unspecified: Secondary | ICD-10-CM

## 2019-10-28 DIAGNOSIS — E039 Hypothyroidism, unspecified: Secondary | ICD-10-CM | POA: Diagnosis not present

## 2019-10-28 NOTE — Addendum Note (Signed)
Addended by: Liliane Channel on: 10/28/2019 11:17 AM   Modules accepted: Orders

## 2019-10-29 LAB — CBC WITH DIFFERENTIAL/PLATELET
Absolute Monocytes: 696 cells/uL (ref 200–950)
Basophils Absolute: 64 cells/uL (ref 0–200)
Basophils Relative: 0.8 %
Eosinophils Absolute: 400 cells/uL (ref 15–500)
Eosinophils Relative: 5 %
HCT: 43.3 % (ref 38.5–50.0)
Hemoglobin: 14.6 g/dL (ref 13.2–17.1)
Lymphs Abs: 2048 cells/uL (ref 850–3900)
MCH: 30 pg (ref 27.0–33.0)
MCHC: 33.7 g/dL (ref 32.0–36.0)
MCV: 89.1 fL (ref 80.0–100.0)
MPV: 9.6 fL (ref 7.5–12.5)
Monocytes Relative: 8.7 %
Neutro Abs: 4792 cells/uL (ref 1500–7800)
Neutrophils Relative %: 59.9 %
Platelets: 404 10*3/uL — ABNORMAL HIGH (ref 140–400)
RBC: 4.86 10*6/uL (ref 4.20–5.80)
RDW: 13.8 % (ref 11.0–15.0)
Total Lymphocyte: 25.6 %
WBC: 8 10*3/uL (ref 3.8–10.8)

## 2019-10-29 LAB — LIPID PANEL (REFL)
Cholesterol: 136 mg/dL (ref <200)
HDL: 41 mg/dL (ref >=40)
LDL Cholesterol (Calc): 72 mg/dL (calc)
Non-HDL Cholesterol (Calc): 95 mg/dL (calc) (ref <130)
Total CHOL/HDL Ratio: 3.3 (calc) (ref <5.0)
Triglycerides: 144 mg/dL (ref <150)

## 2019-10-29 LAB — COMPLETE METABOLIC PANEL WITH GFR
AG Ratio: 1.5 (calc) (ref 1.0–2.5)
ALT: 14 U/L (ref 9–46)
AST: 19 U/L (ref 10–35)
Albumin: 3.8 g/dL (ref 3.6–5.1)
Alkaline phosphatase (APISO): 80 U/L (ref 35–144)
BUN/Creatinine Ratio: 10 (calc) (ref 6–22)
BUN: 12 mg/dL (ref 7–25)
CO2: 25 mmol/L (ref 20–32)
Calcium: 9 mg/dL (ref 8.6–10.3)
Chloride: 105 mmol/L (ref 98–110)
Creat: 1.22 mg/dL — ABNORMAL HIGH (ref 0.70–1.18)
GFR, Est African American: 69 mL/min/{1.73_m2} (ref >=60)
GFR, Est Non African American: 60 mL/min/{1.73_m2} (ref >=60)
Globulin: 2.6 g/dL (calc) (ref 1.9–3.7)
Glucose, Bld: 96 mg/dL (ref 65–99)
Potassium: 3.9 mmol/L (ref 3.5–5.3)
Sodium: 140 mmol/L (ref 135–146)
Total Bilirubin: 0.4 mg/dL (ref 0.2–1.2)
Total Protein: 6.4 g/dL (ref 6.1–8.1)

## 2019-10-29 LAB — HEMOGLOBIN A1C
Hgb A1c MFr Bld: 5.5 % of total Hgb (ref <5.7)
Mean Plasma Glucose: 111 (calc)
eAG (mmol/L): 6.2 (calc)

## 2019-10-29 LAB — VITAMIN B12: Vitamin B-12: 609 pg/mL (ref 200–1100)

## 2019-10-29 LAB — IRON,TIBC AND FERRITIN PANEL
%SAT: 25 % (calc) (ref 20–48)
Ferritin: 50 ng/mL (ref 24–380)
Iron: 81 ug/dL (ref 50–180)
TIBC: 329 mcg/dL (calc) (ref 250–425)

## 2019-10-29 LAB — TSH: TSH: 2.18 mIU/L (ref 0.40–4.50)

## 2019-10-29 LAB — VITAMIN D 25 HYDROXY (VIT D DEFICIENCY, FRACTURES): Vit D, 25-Hydroxy: 34 ng/mL (ref 30–100)

## 2019-11-10 ENCOUNTER — Ambulatory Visit: Payer: Medicare Other | Admitting: Urology

## 2019-11-10 ENCOUNTER — Ambulatory Visit (INDEPENDENT_AMBULATORY_CARE_PROVIDER_SITE_OTHER): Payer: Medicare Other | Admitting: Urology

## 2019-11-10 ENCOUNTER — Other Ambulatory Visit: Payer: Self-pay

## 2019-11-10 ENCOUNTER — Encounter: Payer: Self-pay | Admitting: Urology

## 2019-11-10 VITALS — BP 132/82 | HR 82 | Temp 98.0°F | Ht 71.0 in | Wt 230.0 lb

## 2019-11-10 DIAGNOSIS — N401 Enlarged prostate with lower urinary tract symptoms: Secondary | ICD-10-CM | POA: Diagnosis not present

## 2019-11-10 DIAGNOSIS — R351 Nocturia: Secondary | ICD-10-CM | POA: Diagnosis not present

## 2019-11-10 LAB — MICROSCOPIC EXAMINATION
Bacteria, UA: NONE SEEN
Epithelial Cells (non renal): NONE SEEN /hpf (ref 0–10)
Renal Epithel, UA: NONE SEEN /hpf
WBC, UA: NONE SEEN /hpf (ref 0–5)

## 2019-11-10 LAB — URINALYSIS, ROUTINE W REFLEX MICROSCOPIC
Bilirubin, UA: NEGATIVE
Glucose, UA: NEGATIVE
Ketones, UA: NEGATIVE
Leukocytes,UA: NEGATIVE
Nitrite, UA: NEGATIVE
Specific Gravity, UA: 1.025 (ref 1.005–1.030)
Urobilinogen, Ur: 0.2 mg/dL (ref 0.2–1.0)
pH, UA: 5.5 (ref 5.0–7.5)

## 2019-11-10 LAB — BLADDER SCAN AMB NON-IMAGING: Scan Result: 66.7

## 2019-11-10 MED ORDER — PROSTAGLANDIN E1 POWD: 5 refills | Status: DC

## 2019-11-10 MED ORDER — MIRABEGRON ER 50 MG PO TB24: 50.0000 mg | ORAL_TABLET | Freq: Every day | ORAL | 11 refills | Status: DC

## 2019-11-10 NOTE — Progress Notes (Signed)
H&P  Chief Complaint: BPH w/ LUTS & Microscopic Hematuria  History of Present Illness:   9.7.2021: Pt reports that a reduction in his caffeine has improved his urinary symptomatology though he notes he is still experiencing urinary intermittency. Pt continues on tamsulosin and notes improved bladder sx's with myrbetriq.   Pt submits that his ED symptomatology is no longer managed with sildenafil and is interested in alternative pharmaceutical therapy.  IPSS Questionnaire (AUA-7): Over the past month.   1)  How often have you had a sensation of not emptying your bladder completely after you finish urinating?  3 - About half the time  2)  How often have you had to urinate again less than two hours after you finished urinating? 2 - Less than half the time  3)  How often have you found you stopped and started again several times when you urinated?  5 - Almost always  4) How difficult have you found it to postpone urination?  3 - About half the time  5) How often have you had a weak urinary stream?  1 - Less than 1 time in 5  6) How often have you had to push or strain to begin urination?  0 - Not at all  7) How many times did you most typically get up to urinate from the time you went to bed until the time you got up in the morning?  3 - 3 times  Total score:  0-7 mildly symptomatic   8-19 moderately symptomatic   20-35 severely symptomatic  QOL score: 17   (below copied from [facility] records):  Bruce [name] is a 69 year-old male established patient who is here for follow up of BPH and lower urinary tract symptoms. Patient is currently treated with Tamsulosin a for his symptoms.  Quality of life score 2   1.31.2020: Reports worsening sense of incomplete emptying with post void dribbling. No issues starting/stopping his stream. He denies dysuria, gross hematuria. Not currently on medical therapy.   2.28.2020: Symptoms improved on tamsulosin.   10.2.2020: He is still off of 5 alpha reductase  inhibitors. He continues on tamsulosin and Myrbetriq.   4.26.2021: at 1st, doubling up on his tamsulosin helped his urinary symptoms. However, recently his urination is just as bad as prior to increasing the dose.  CC/HPI: I am having difficulty with my erections. CC: I have blood in my urine. HPI: He did not see the blood in his urine.   Here for /fu of microhematuria, noted recently and last year.  9.9.2020: CT results--  IMPRESSION:  1. 3 mm nonobstructive calculus in the urinary bladder. No  additional ureteral calculi or signs of urinary tract obstruction.  2. 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  3. There are calcifications of the aortic valve. Echocardiographic  correlation for evaluation of potential valvular dysfunction may be  warranted if clinically indicated.  4. Aortic atherosclerosis.  5. Multiple small supraumbilical ventral hernias and large umbilical  hernia containing only omental fat. No signs of associated bowel  incarceration or obstruction at this time.  6. Additional incidental findings, as above.  No recent hematuria.   CC/HPI: A is   AUA Symptom Score: Almost always he has the sensation of not emptying his bladder completely when finished urinating. Almost always he has to urinate again fewer than two hours after he has finished urinating. Almost always he has to start and  stop again several times when he urinates. Almost always he finds it difficult to postpone urination. 50% of the time he has a weak urinary stream. 50% of the time he has to push or strain to begin urination. He has to get up to urinate 5 or more times from the time he goes to bed until the time he gets up in the morning.   Calculated AUA Symptom Score: 31   Past Medical History:  Diagnosis Date  . Anemia   . Arthritis   . Bowel obstruction ([place])   . Chronic headaches   . Colon polyps   .  Community acquired pneumonia 03/02/2015  . Crohn's disease ([place])   . Diverticulosis   . DM (diabetes mellitus) ([place])   . Gallstones   . Hemorrhoids   . Hypertension   . Hypothyroidism   . Ileitis   . Insomnia   . Obesity   . Prostate enlargement   . Sleep apnea     Past Surgical History:  Procedure Laterality Date  . BOWEL RESECTION N/A 08/11/2013   x2- one at [place] one in 2015  . CHOLECYSTECTOMY    . COLONOSCOPY  01/19/2011   Procedure: COLONOSCOPY;  Surgeon: Rogene Houston, MD;  [facility];  Service: Endoscopy;  Laterality: N/A;  9:30 am  . INCISION AND DRAINAGE PERIRECTAL ABSCESS N/A 09/08/2013   Procedure: IRRIGATION AND DEBRIDEMENT PERIRECTAL ABSCESS;  Surgeon: Imogene Burn. Georgette Dover, MD;  Location: [facility];  Service: General;  Laterality: N/A;  . LAPAROTOMY N/A 08/11/2013   Procedure: EXPLORATORY LAPAROTOMY ;  Surgeon: Joyice Faster. Cornett, MD;  Location: [facility];  Service: General;  Laterality: N/A;  . NASAL SINUS SURGERY    . seventh nerve face    . spleenectomy     Punctured after a fall in 1963  . TONSILLECTOMY AND ADENOIDECTOMY      Home Medications:  Allergies as of 11/10/2019      Reactions   Lisinopril Cough   Coconut Flavor Other (See Comments)   Does not eat because of crohn's       Medication List       Accurate as of November 10, 2019 12:57 PM. If you have any questions, ask your nurse or doctor.        carvedilol 6.25 MG tablet Commonly known as: COREG Take by mouth 2 (two) times daily with a meal.   cyanocobalamin 2000 MCG tablet Take 2,000 mcg by mouth daily.   escitalopram 10 MG tablet Commonly known as: Lexapro Take 1 tablet (10 mg total) by mouth daily.   Garlic 8811 MG Caps Take by mouth.   levothyroxine 200 MCG tablet Commonly known as: SYNTHROID Take 1 tablet (200 mcg total) by mouth daily before breakfast.   One-A-Day Mens Health Formula Tabs Take by mouth.   polyethylene glycol 17 g packet Commonly known as: MIRALAX /  GLYCOLAX Take 17 g by mouth daily.   potassium gluconate 595 (99 K) MG Tabs tablet Take 595 mg by mouth.   rivaroxaban 20 MG Tabs tablet Commonly known as: Xarelto Take 1 tablet (20 mg total) by mouth daily with supper.   tamsulosin 0.4 MG Caps capsule Commonly known as: FLOMAX 0.4 mg 2 (two) times daily.       Allergies:  Allergies  Allergen Reactions  . Lisinopril Cough  . Coconut Flavor Other (See Comments)    Does not eat because of crohn's     Family History  Problem Relation Age of  Onset  . Colon cancer Mother   . Uterine cancer Mother   . Colon cancer Sister   . Healthy Sister   . Healthy Sister   . Crohn's disease Daughter   . Arthritis Daughter   . Healthy Son   . Brain cancer Father   . Other Daughter        accidental death  . Heart disease Maternal Grandmother   . Bladder Cancer Paternal Grandfather   . Irritable bowel syndrome Sister     Social History:  reports that he quit smoking about 13 years ago. His smoking use included cigarettes. He has a 160.00 pack-year smoking history. He has never used smokeless tobacco. He reports that he does not drink alcohol and does not use drugs.  ROS: A complete review of systems was performed.  All systems are negative except for pertinent findings as noted.  Physical Exam:  Vital signs in last 24 hours: There were no vitals taken for this visit. Constitutional:  Alert and oriented, No acute distress Cardiovascular: Regular rate  Respiratory: Normal respiratory effort Neurologic: Grossly intact, no focal deficits Psychiatric: Normal mood and affect  I have reviewed prior pt notes  I have reviewed notes from referring/previous physicians  I have reviewed urinalysis results  I have independently reviewed prior imaging  I have reviewed prior PSA results  I have reviewed prior urine culture Impression/Assessment:  1. BPH w/ LUTS--improved w/ med rx as well as decreasing caffeine  2. ED--worsening  despite PDE 5 inhibitors  Plan:  1. Pt advised to reduce his myrbetriq regimen from QD to QOD as is tolerable. Scrip sent in  2. Ptwill be  started on prostaglandin injections for ED. (Place on cancellation call list for next available penile injection orientation.)  3. F/U in 3 months for OV and symptom recheck.  CC: Dr. Garret Reddish

## 2019-11-10 NOTE — Progress Notes (Signed)
Urological Symptom Review  Patient is experiencing the following symptoms: Frequent urination Hard to postpone urination Get up at night to urinate Stream starts and stops Erection problems (male only)   Review of Systems  Gastrointestinal (upper)  : Negative for upper GI symptoms  Gastrointestinal (lower) : Negative for lower GI symptoms  Constitutional : Negative for symptoms  Skin: Negative for skin symptoms  Eyes: Negative for eye symptoms  Ear/Nose/Throat : Negative for Ear/Nose/Throat symptoms  Hematologic/Lymphatic: Negative for Hematologic/Lymphatic symptoms  Cardiovascular : Negative for cardiovascular symptoms  Respiratory : Negative for respiratory symptoms  Endocrine: Negative for endocrine symptoms  Musculoskeletal: Negative for musculoskeletal symptoms  Neurological: Negative for neurological symptoms  Psychologic: Negative for psychiatric symptoms

## 2019-11-25 ENCOUNTER — Encounter: Payer: Self-pay | Admitting: Family Medicine

## 2019-11-25 ENCOUNTER — Encounter (HOSPITAL_COMMUNITY): Payer: Self-pay | Admitting: Family Medicine

## 2019-12-07 ENCOUNTER — Encounter: Payer: Self-pay | Admitting: Neurology

## 2019-12-07 ENCOUNTER — Ambulatory Visit (INDEPENDENT_AMBULATORY_CARE_PROVIDER_SITE_OTHER): Payer: Medicare Other | Admitting: Neurology

## 2019-12-07 ENCOUNTER — Other Ambulatory Visit: Payer: Self-pay

## 2019-12-07 VITALS — BP 126/84 | HR 97 | Ht 71.0 in | Wt 232.6 lb

## 2019-12-07 DIAGNOSIS — G4481 Hypnic headache: Secondary | ICD-10-CM

## 2019-12-07 DIAGNOSIS — R413 Other amnesia: Secondary | ICD-10-CM

## 2019-12-07 NOTE — Patient Instructions (Signed)
1.  Continue having a strong cup of coffee every night. 2.  Will check neurocognitive testing 3.  Follow up afterwards.

## 2019-12-07 NOTE — Progress Notes (Signed)
NEUROLOGY FOLLOW UP OFFICE NOTE  Bruce [name] [name] [id number]  HISTORY OF PRESENT ILLNESS: Bruce [name]. 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  UPDATE: Advised to take caffeine every evening (179m or strong cup of coffee).  He has not had a headache at night since then.  Of note, he reports short-term memory deficits for the past 6 months.  He forgets why he walked into a room or what he needed to buy at the store.  He is also feeling more irritable.  B12 and TSH in August were normal.  HISTORY: In November 2020 he began experiencing headaches that wake him up at night from sleep. It is a severe diffuse headache, described as sensation of "movement in my head". It typically lasts about 15 seconds and subsides. If he falls back asleep, he may be woken up with another one about a couple of hours later. No associated visual disturbance, nausea, photophobia or phonophobia or unilateral numbness or weakness. On average, it has occurred 12 to 14 days in a month. No prior head trauma, illness or prior history of headache.  Of note, the third finger on his right hand would lock up several times a day. He needs to take his other hand to open it up. No weakness, numbness or neck pain radiating down the arm.  MRI of brain with and without contrast on 02/26/2019 was personally reviewed and demonstrated chronic small vessel ischemic changes with remote lacunar infarcts in the bilateral basal ganglia but no acute intracranial abnormality to explain headache.  Past management:  Melatonin (nightmares)  PAST MEDICAL HISTORY: Past Medical History:  Diagnosis Date  . Anemia   . Arthritis   . [place]   . Chronic headaches   . Colon polyps   . Community acquired pneumonia 03/02/2015  . [place]   . Diverticulosis   . DM (diabetes mellitus) ([place]   . Gallstones   .  Hemorrhoids   . Hypertension   . Hypothyroidism   . Ileitis   . Insomnia   . Obesity   . Prostate enlargement   . Sleep apnea     MEDICATIONS: Current Outpatient Medications on File Prior to Visit  Medication Sig Dispense Refill  . Alprostadil (PROSTAGLANDIN E1) POWD Inject 10 mcg in penis as directed 5 prefilled 10 mcg syr's 0.001 g 5  . aspirin 81 MG chewable tablet Chew by mouth daily.    . carvedilol (COREG) 6.25 MG tablet Take by mouth 2 (two) times daily with a meal.     . Garlic 27902MG CAPS Take by mouth.    . levothyroxine (SYNTHROID) 200 MCG tablet Take 1 tablet (200 mcg total) by mouth daily before breakfast. 90 tablet 3  . mirabegron ER (MYRBETRIQ) 25 MG TB24 tablet Take 25 mg by mouth daily.    . mirabegron ER (MYRBETRIQ) 50 MG TB24 tablet Take 1 tablet (50 mg total) by mouth daily. 30 tablet 11  . Multiple Vitamins-Minerals (ONE-A-DAY MENS HEALTH FORMULA) TABS Take by mouth.    . polyethylene glycol (MIRALAX / GLYCOLAX) 17 g packet Take 17 g by mouth daily.    . potassium gluconate 595 (99 K) MG TABS tablet Take 595 mg by mouth.    . tamsulosin (FLOMAX) 0.4 MG CAPS capsule 0.4 mg 2 (two) times daily.     No current facility-administered medications on file prior to visit.  ALLERGIES: Allergies  Allergen Reactions  . Lisinopril Cough  . Coconut Flavor Other (See Comments)    Does not eat because of crohn's     FAMILY HISTORY: Family History  Problem Relation Age of Onset  . Colon cancer Mother   . Uterine cancer Mother   . Colon cancer Sister   . Healthy Sister   . Healthy Sister   . Crohn's disease Daughter   . Arthritis Daughter   . Healthy Son   . Brain cancer Father   . Other Daughter        accidental death  . Heart disease Maternal Grandmother   . Bladder Cancer Paternal Grandfather   . Irritable bowel syndrome Sister     SOCIAL HISTORY: Social History   Socioeconomic History  . Marital status: Widowed    Spouse name: Not on file  .  Number of children: 3  . Years of education: Not on file  . Highest education level: Not on file  Occupational History  . Occupation: retired    Fish farm manager: retired  Tobacco Use  . Smoking status: Former Smoker    Packs/day: 4.00    Years: 40.00    Pack years: 160.00    Types: Cigarettes    Quit date: 12/24/2005    Years since quitting: 13.9  . Smokeless tobacco: Never Used  Vaping Use  . Vaping Use: Never used  Substance and Sexual Activity  . Alcohol use: No  . Drug use: No  . Sexual activity: Yes  Other Topics Concern  . Not on file  Social History Narrative   Widowed 2018.  2 living children. 1 deceased daughter car wreck. 2 grandkids      Retired from [employer].       [place] time      Right handed      One story home   Social Determinants of Health   Financial Resource Strain:   . Difficulty of Paying Living Expenses: Not on file  Food Insecurity:   . Worried About Charity fundraiser in the Last Year: Not on file  . Ran Out of Food in the Last Year: Not on file  Transportation Needs:   . Lack of Transportation (Medical): Not on file  . Lack of Transportation (Non-Medical): Not on file  Physical Activity:   . Days of Exercise per Week: Not on file  . Minutes of Exercise per Session: Not on file  Stress:   . Feeling of Stress : Not on file  Social Connections:   . Frequency of Communication with Friends and Family: Not on file  . Frequency of Social Gatherings with Friends and Family: Not on file  . Attends Religious Services: Not on file  . Active Member of Clubs or Organizations: Not on file  . Attends Archivist Meetings: Not on file  . Marital Status: Not on file  Intimate Partner Violence:   . Fear of Current or Ex-Partner: Not on file  . Emotionally Abused: Not on file  . Physically Abused: Not on file  . Sexually Abused: Not on file    PHYSICAL EXAM: Blood pressure  126/84, pulse 97, height 5' 11"  (1.803 m), weight 232 lb 9.6 oz (105.5 kg), SpO2 96 %. General: No acute distress.  Patient appears well-groomed.   Head:  Normocephalic/atraumatic Eyes:  Fundi examined but not visualized Neck: supple, no paraspinal tenderness, full range of motion Heart:  Regular rate and rhythm Lungs:  Clear to auscultation bilaterally Back: No paraspinal tenderness Neurological Exam:  [employer] Mental Exam 12/07/2019  Weekday Correct 1  Current year 1  What state are we in? 1  Amount spent 1  Amount left 0  # of Animals 3  5 objects recall 5  Number series 2  Hour markers 2  Time correct 0  Placed X in triangle correctly 1  Largest Figure 1  Name of male 2  Date back to work 2  Type of work 2  State she lived in 2  Total score 26   Right sided upper and lower facial palsy and decreased hearing.  Otherwise, CN II-XII intact. Bulk and tone normal, muscle strength 5/5 throughout.  Sensation to light touch intact.  Deep tendon reflexes 2+ throughout.  Finger to nose and heel to shin testing intact.  Gait normal, Romberg negative.  IMPRESSION: 1.  Hypnic headaches 2.  Memory deficits  PLAN: 1.  Cup of coffee every night. 2.  Neuropsychological testing 3.  Follow up after testing.  Metta Clines, DO  CC: Garret Reddish, MD

## 2019-12-25 ENCOUNTER — Encounter: Payer: Self-pay | Admitting: Family Medicine

## 2019-12-30 DIAGNOSIS — I10 Essential (primary) hypertension: Secondary | ICD-10-CM | POA: Diagnosis not present

## 2019-12-30 DIAGNOSIS — I428 Other cardiomyopathies: Secondary | ICD-10-CM | POA: Diagnosis not present

## 2020-01-06 ENCOUNTER — Other Ambulatory Visit: Payer: Self-pay

## 2020-01-06 ENCOUNTER — Encounter: Payer: Self-pay | Admitting: Family Medicine

## 2020-01-06 DIAGNOSIS — Z1152 Encounter for screening for COVID-19: Secondary | ICD-10-CM

## 2020-01-07 ENCOUNTER — Other Ambulatory Visit: Payer: Self-pay

## 2020-01-07 DIAGNOSIS — Z1152 Encounter for screening for COVID-19: Secondary | ICD-10-CM

## 2020-01-11 ENCOUNTER — Other Ambulatory Visit: Payer: Medicare Other

## 2020-01-11 ENCOUNTER — Other Ambulatory Visit: Payer: Self-pay

## 2020-01-11 DIAGNOSIS — Z1152 Encounter for screening for COVID-19: Secondary | ICD-10-CM

## 2020-01-12 LAB — SARS-COV-2 ANTIBODY(IGG)SPIKE,SEMI-QUANTITATIVE: SARS COV1 AB(IGG)SPIKE,SEMI QN: 7.83 index — ABNORMAL HIGH (ref <1.00)

## 2020-01-18 ENCOUNTER — Encounter: Payer: Medicare Other | Admitting: Psychology

## 2020-01-23 DIAGNOSIS — H40033 Anatomical narrow angle, bilateral: Secondary | ICD-10-CM | POA: Diagnosis not present

## 2020-01-23 DIAGNOSIS — H04123 Dry eye syndrome of bilateral lacrimal glands: Secondary | ICD-10-CM | POA: Diagnosis not present

## 2020-01-26 ENCOUNTER — Encounter: Payer: Medicare Other | Admitting: Psychology

## 2020-02-02 ENCOUNTER — Encounter: Payer: Self-pay | Admitting: Psychology

## 2020-02-02 ENCOUNTER — Ambulatory Visit: Payer: Medicare Other | Admitting: Psychology

## 2020-02-02 ENCOUNTER — Other Ambulatory Visit: Payer: Self-pay

## 2020-02-02 ENCOUNTER — Encounter: Payer: Medicare Other | Admitting: Psychology

## 2020-02-02 ENCOUNTER — Ambulatory Visit (INDEPENDENT_AMBULATORY_CARE_PROVIDER_SITE_OTHER): Payer: Medicare Other | Admitting: Psychology

## 2020-02-02 DIAGNOSIS — S069X9A Unspecified intracranial injury with loss of consciousness of unspecified duration, initial encounter: Secondary | ICD-10-CM | POA: Insufficient documentation

## 2020-02-02 DIAGNOSIS — R4189 Other symptoms and signs involving cognitive functions and awareness: Secondary | ICD-10-CM | POA: Diagnosis not present

## 2020-02-02 DIAGNOSIS — G4733 Obstructive sleep apnea (adult) (pediatric): Secondary | ICD-10-CM

## 2020-02-02 DIAGNOSIS — K5090 Crohn's disease, unspecified, without complications: Secondary | ICD-10-CM | POA: Insufficient documentation

## 2020-02-02 DIAGNOSIS — I6381 Other cerebral infarction due to occlusion or stenosis of small artery: Secondary | ICD-10-CM | POA: Diagnosis not present

## 2020-02-02 DIAGNOSIS — G3184 Mild cognitive impairment, so stated: Secondary | ICD-10-CM

## 2020-02-02 DIAGNOSIS — R519 Headache, unspecified: Secondary | ICD-10-CM | POA: Insufficient documentation

## 2020-02-02 DIAGNOSIS — S069XAA Unspecified intracranial injury with loss of consciousness status unknown, initial encounter: Secondary | ICD-10-CM

## 2020-02-02 HISTORY — DX: Unspecified intracranial injury with loss of consciousness status unknown, initial encounter: S06.9XAA

## 2020-02-02 HISTORY — DX: Unspecified intracranial injury with loss of consciousness of unspecified duration, initial encounter: S06.9X9A

## 2020-02-02 NOTE — Progress Notes (Signed)
   Psychometrician Note   Cognitive testing was administered to Bruce [name] by Milana Kidney, B.S. (psychometrist) under the supervision of Dr. Christia Reading, Ph.D., licensed psychologist on 02/02/20. Bruce [name] did not appear overtly distressed by the testing session per behavioral observation or responses across self-report questionnaires. Dr. Christia Reading, Ph.D. checked in with Bruce [name] as needed to manage any distress related to testing procedures (if applicable). Rest breaks were offered.    The battery of tests administered was selected by Dr. Christia Reading, Ph.D. with consideration to Bruce [name] current level of functioning, the nature of his symptoms, emotional and behavioral responses during interview, level of literacy, observed level of motivation/effort, and the nature of the referral question. This battery was communicated to the psychometrist. Communication between Dr. Christia Reading, Ph.D. and the psychometrist was ongoing throughout the evaluation and Dr. Christia Reading, Ph.D. was immediately accessible at all times. Dr. Christia Reading, Ph.D. provided supervision to the psychometrist on the date of this service to the extent necessary to assure the quality of all services provided.    Bruce [name] will return within approximately 1-2 weeks for an interactive feedback session with Dr. Melvyn Novas at which time his test performances, clinical impressions, and treatment recommendations will be reviewed in detail. Bruce [name] understands he can contact our office should he require our assistance before this time.  A total of 110 minutes of billable time were spent face-to-face with Bruce [name] by the psychometrist. This includes both test administration and scoring time. Billing for these services is reflected in the clinical report generated by Dr. Christia Reading, Ph.D..  This note reflects time spent with the psychometrician and does not include test scores or  any clinical interpretations made by Dr. Melvyn Novas. The full report will follow in a separate note.

## 2020-02-02 NOTE — Progress Notes (Addendum)
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 learned information was below average to above average. Retention rates were 78% across a story learning task, 50% across a list learning task, and 89% across a complex figure drawing task. Performance across recognition tasks was mildly variable but overall below average to average, suggesting evidence for information consolidation.   Results of emotional screening instruments suggested that recent symptoms of generalized anxiety were in the mild range, while symptoms of depression were also within the mild range. A screening instrument assessing recent sleep quality suggested the presence of mild sleep dysfunction.  Tables of Scores:   Note: This summary of test scores accompanies the interpretive report and should not be considered in isolation without reference to the appropriate sections in the text. Descriptors are based on appropriate normative data and may be adjusted based on clinical judgment. The terms "impaired" and "within normal limits (WNL)" are used when a more specific level of functioning cannot be determined.       Effort Testing:   DESCRIPTOR       ACS Word Choice: --- --- Within Expectation  Dot Counting Test: --- --- Within Expectation  RBANS Effort Index: --- --- Within  Expectation  WAIS-IV Reliable Digit Span: --- --- Within Expectation  D-KEFS Color Word Effort Index: --- --- Within Expectation       Orientation:      Raw Score Percentile   NAB Orientation, Form 1 29/29 --- ---       Cognitive Screening:           Raw Score Percentile   SLUMS: 22/30 --- ---       RBANS, Form A: Standard Score/ Scaled Score Percentile   Total Score 90 25 Average  Immediate Memory 85 16 Below Average    List Learning 6 9 Below Average    Story Memory 9 37 Average  Visuospatial/Constructional 89 23 Below Average    Figure Copy 10 50 Average    Line Orientation 13/20 10-16 Below Average  Language 96 39 Average    Picture Naming 10/10 51-75 Average    Semantic Fluency 8 25 Average  Attention 94 34 Average    Digit Span 11 63 Average    Coding 7 16 Below Average  Delayed Memory 103 58 Average    List Recall 3/10 17-25 Below Average to Average    List Recognition 20/20 51-75 Average    Story Recall 8 25 Average    Story Recognition 8/12 8-15 Below Average    Figure Recall 13 84 Above Average    Figure Recognition 8/8 70+ Average       Intellectual Functioning:           Standard Score Percentile  Test of Premorbid Functioning: 96 39 Average       Attention/Executive Function:          Trail Making Test (TMT): Raw Score (T Score) Percentile     Part A 50 secs.,  0 errors (39) 14 Below Average    Part B 86 secs.,  0 errors (52) 58 Average         Scaled Score Percentile   WAIS-IV Digit Span: 10 50 Average    Forward 10 50 Average    Backward 11 63 Average    Sequencing 9 37 Average        Scaled Score Percentile   WAIS-IV Similarities: 12 75 Above Average       D-KEFS Color-Word Interference Test: Raw Score (Scaled Score) Percentile     Color Naming 34 secs. (10) 50 Average    Word Reading 28 secs. (9) 37 Average    Inhibition 71 secs. (10) 50 Average      Total Errors 2 errors (11) 63 Average    Inhibition/Switching 78 secs. (10) 50  Average      Total Errors 1 error (12) 75 Above Average       D-KEFS Verbal Fluency Test: Raw Score (Scaled Score) Percentile     Letter Total Correct 30 (9) 37 Average    Category Total Correct 44 (14) 91 Above Average    Category Switching Total Correct 14 (12) 75 Above Average    Category Switching Accuracy 13 (12) 75 Above Average      Total Set Loss Errors 0 (13) 84 Above Average      Total Repetition Errors 1 (12) 75 Above Average       NAB Executive Functions Module, Form 1: T Score Percentile     Judgment 75 99 Exceptionally High       Language:          Verbal Fluency Test: Raw Score (T Score) Percentile     Phonemic Fluency (FAS) 30 (43) 25 Average    Animal Fluency 24 (62) 88 Above Average        NAB Language Module, Form 1: T Score Percentile     Auditory Comprehension 58 79 Above Average    Naming 31/31 (58) 79 Above Average       Visuospatial/Visuoconstruction:      Raw Score Percentile   Clock Drawing: 6/10 --- Impaired       Mood and Personality:      Raw Score Percentile   Geriatric Depression Scale: 11 --- Mild  Geriatric Anxiety Scale: 21 --- Mild    Somatic 11 --- Moderate    Cognitive 5 --- Mild    Affective 5 --- Mild       Additional Questionnaires:      Raw Score Percentile   PROMIS Sleep Disturbance Questionnaire: 27 --- Mild   Informed Consent and Coding/Compliance:   Mr. Nicole Kindred was provided with a verbal description of the nature and purpose of the present neuropsychological evaluation. Also reviewed were the foreseeable risks and/or discomforts and benefits of the procedure, limits of confidentiality, and mandatory reporting requirements of this provider. The patient was given the opportunity to ask questions and receive answers about the evaluation. Oral consent to participate was provided by the patient.   This evaluation was conducted by Christia Reading, Ph.D., licensed clinical neuropsychologist. Mr. Nicole Kindred completed a comprehensive  clinical interview with Dr. Melvyn Novas, billed as one unit [phone], and 110 minutes of cognitive testing and scoring,  billed as one unit [phone] and three additional units 96139. Psychometrist Milana Kidney, B.S., assisted Dr. Melvyn Novas with test administration and scoring procedures. As a separate and discrete service, Dr. Melvyn Novas spent a total of 160 minutes in interpretation and report writing billed as one unit [phone] and two units 96133.

## 2020-02-09 ENCOUNTER — Encounter: Payer: Self-pay | Admitting: Urology

## 2020-02-09 ENCOUNTER — Ambulatory Visit (INDEPENDENT_AMBULATORY_CARE_PROVIDER_SITE_OTHER): Payer: Medicare Other | Admitting: Urology

## 2020-02-09 ENCOUNTER — Other Ambulatory Visit: Payer: Self-pay

## 2020-02-09 VITALS — BP 152/85 | HR 79 | Temp 98.1°F | Ht 72.0 in | Wt 235.0 lb

## 2020-02-09 DIAGNOSIS — R972 Elevated prostate specific antigen [PSA]: Secondary | ICD-10-CM | POA: Diagnosis not present

## 2020-02-09 DIAGNOSIS — N5201 Erectile dysfunction due to arterial insufficiency: Secondary | ICD-10-CM | POA: Diagnosis not present

## 2020-02-09 DIAGNOSIS — R351 Nocturia: Secondary | ICD-10-CM | POA: Diagnosis not present

## 2020-02-09 DIAGNOSIS — N401 Enlarged prostate with lower urinary tract symptoms: Secondary | ICD-10-CM

## 2020-02-09 LAB — URINALYSIS, ROUTINE W REFLEX MICROSCOPIC
Bilirubin, UA: NEGATIVE
Glucose, UA: NEGATIVE
Ketones, UA: NEGATIVE
Nitrite, UA: NEGATIVE
Specific Gravity, UA: 1.025 (ref 1.005–1.030)
Urobilinogen, Ur: 0.2 mg/dL (ref 0.2–1.0)
pH, UA: 5.5 (ref 5.0–7.5)

## 2020-02-09 LAB — MICROSCOPIC EXAMINATION
Bacteria, UA: NONE SEEN
Epithelial Cells (non renal): NONE SEEN /hpf (ref 0–10)
Renal Epithel, UA: NONE SEEN /hpf

## 2020-02-09 NOTE — Progress Notes (Signed)
Urological Symptom Review  Patient is experiencing the following symptoms: Get up at night to urinate Erection problems (male only)  Kidney stones   Review of Systems  Gastrointestinal (upper)  : Negative for upper GI symptoms  Gastrointestinal (lower) : Negative for lower GI symptoms  Constitutional : Negative for symptoms  Skin: Negative for skin symptoms  Eyes: Negative for eye symptoms  Ear/Nose/Throat : Negative for Ear/Nose/Throat symptoms  Hematologic/Lymphatic: Easy bruising  Cardiovascular : Negative for cardiovascular symptoms  Respiratory : Negative for respiratory symptoms  Endocrine: Negative for endocrine symptoms  Musculoskeletal: Negative for musculoskeletal symptoms  Neurological: Negative for neurological symptoms  Psychologic: Negative for psychiatric symptoms
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
## 2020-02-10 ENCOUNTER — Ambulatory Visit (INDEPENDENT_AMBULATORY_CARE_PROVIDER_SITE_OTHER): Payer: Medicare Other | Admitting: Psychology

## 2020-02-10 DIAGNOSIS — G4733 Obstructive sleep apnea (adult) (pediatric): Secondary | ICD-10-CM | POA: Diagnosis not present

## 2020-02-10 DIAGNOSIS — I6381 Other cerebral infarction due to occlusion or stenosis of small artery: Secondary | ICD-10-CM

## 2020-02-10 DIAGNOSIS — R4181 Age-related cognitive decline: Secondary | ICD-10-CM

## 2020-02-10 DIAGNOSIS — S069X9S Unspecified intracranial injury with loss of consciousness of unspecified duration, sequela: Secondary | ICD-10-CM | POA: Diagnosis not present

## 2020-02-10 NOTE — Patient Instructions (Signed)
A repeat neuropsychological evaluation in 24-36 months (or sooner if functional decline is noted) is recommended to assess the trajectory of future cognitive decline should it occur. This will also aid in future efforts towards improved diagnostic clarity.  Ongoing treatment of hearing loss and obstructive sleep apnea will be vital in enhancing day-to-day cognitive abilities. Admittedly, this will likely be challenging as hearing aids have not proven useful in the past given that hearing loss is reportedly due to nerve damage stemming from his TBI and he has been unable to find a CPAP mask which fits well enough and does not cause recurring eye infections. Despite prior challenges. I would encourage [name] and his family to check in with his medical team regarding these issues to see if any alternate approaches remain. Uncorrected hearing loss has been linked to the development of dementia in the future. Likewise, untreated sleep apnea will increase his risk for stroke, heart attack, and future cognitive decline.    Performance across neurocognitive testing is not a strong predictor of an individual's safety operating a motor vehicle. Should his family wish to pursue a formalized driving evaluation, they would be encouraged to [employer] in [place], [place] at [phone].Another option would be through [facility]; however, the latter would likely require a referral from a medical doctor. [facility] can be reached directly at [phone].  [name] is encouraged to attend to lifestyle factors for brain health (e.g., regular physical exercise, good nutrition habits, regular participation in cognitively-stimulating activities, and general stress management techniques), which are likely to have benefits for both emotional adjustment and cognition. In fact, in addition to promoting good general health, regular exercise incorporating aerobic activities (e.g., brisk  walking, jogging, cycling, etc.) has been demonstrated to be a very effective treatment for depression and stress, with similar efficacy rates to both antidepressant medication and psychotherapy. Optimal control of vascular risk factors (including safe cardiovascular exercise and adherence to dietary recommendations) is encouraged.   If interested, there are some activities which have therapeutic value and can be useful in keeping him cognitively stimulated. For suggestions, [name] is encouraged to go to the following website: https://[email or web address].org/get-to-know-[email or web address]/[email or web address]-programs/neurorehabilitation-center/neuro-rehab-apps-and-games/ which has options, categorized by level of difficulty. It should be noted that these activities should not be viewed as a substitute for therapy.  When learning new information, he would benefit from information being broken up into small, manageable pieces. He may also find it helpful to articulate the material in his own words and in a context to promote encoding at the onset of a new task. This material may need to be repeated multiple times to promote encoding.  Memory can be improved using internal strategies such as rehearsal, repetition, chunking, mnemonics, association, and imagery. External strategies such as written notes in a consistently used memory journal, visual and nonverbal auditory cues such as a calendar on the refrigerator or appointments with alarm, such as on a cell phone, can also help maximize recall.    To address problems with processing speed, he may wish to consider:   -Ensuring that he is alerted when essential material or instructions are being presented   -Adjusting the speed at which new information is presented   -Allowing for more time in comprehending, processing, and responding in conversation  To address problems with fluctuating attention, he may wish to consider:   -Avoiding external distractions when needing to  concentrate   -Limiting exposure to fast paced environments with multiple sensory demands   -Writing down  complicated information and using checklists   -Attempting and completing one task at a time (i.e., no multi-tasking)   -Verbalizing aloud each step of a task to maintain focus   -Reducing the amount of information considered at one time

## 2020-02-10 NOTE — Progress Notes (Signed)
   Neuropsychology Feedback Session Bruce [name]. [place] [facility]  Reason for Referral:   Bruce INNISS IIIis a 69 y.o. right-handed Caucasian male referred by Metta Clines, D.O.,to characterize hiscurrent cognitive functioning and assist with diagnostic clarity and treatment planning in the context of subjective cognitive decline, several medical comorbidities, and a family history of Alzheimer's disease.   Feedback:   Bruce [name] completed a comprehensive neuropsychological evaluation on 02/02/2020. Please refer to that encounter for the full report and recommendations. Briefly, results suggested neuropsychological functioning within normal limits relative to age-matched peers and premorbid intellectual estimations. He did exhibit an isolated weakness across a clock drawing task; however, performance across other visuospatial tasks was strong, making this performance not cause for concern. It is worth noting that Bruce [name] completed testing with an amplification device that alleviated hearing loss concerns. Given that he performed well with this device, it seems apparent that hearing loss represents a primary culprit for day-to-day difficulties which he experiences in his daily life. Hearing loss could certainly impact learning and memory and could further explain some variability across testing. In addition to this, there is good probability of an underlying vascular etiology to explain cognitive and functional dysfunction. Variability across processing speed and memory tasks would be expected with this presentation. It would further be consistent with his history of basal ganglia lacunar infarct, neuroimaging suggesting mild to moderate small vessel ischemic changes, numerous cardiovascular ailments, and history of untreated sleep apnea.  Bruce [name] was accompanied by his daughter and son during the current feedback session. Content of the current session  focused on the results of his neuropsychological evaluation. Bruce [name] and his family were given the opportunity to ask questions and their questions were answered. They were encouraged to reach out should additional questions arise. A copy of his report was provided at the conclusion of the visit.      20 minutes were spent conducting the current feedback session with Bruce [name], billed as one unit [phone].

## 2020-02-11 ENCOUNTER — Ambulatory Visit: Payer: Medicare Other | Admitting: Neurology
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
## 2020-02-15 ENCOUNTER — Ambulatory Visit: Payer: Medicare Other | Admitting: Neurology

## 2020-02-15 ENCOUNTER — Encounter: Payer: Self-pay | Admitting: Neurology

## 2020-02-15 ENCOUNTER — Other Ambulatory Visit: Payer: Self-pay

## 2020-02-15 VITALS — BP 144/88 | HR 84 | Ht 72.0 in | Wt 237.2 lb

## 2020-02-15 DIAGNOSIS — G4733 Obstructive sleep apnea (adult) (pediatric): Secondary | ICD-10-CM

## 2020-02-15 DIAGNOSIS — G4481 Hypnic headache: Secondary | ICD-10-CM

## 2020-02-15 DIAGNOSIS — F01A0 Vascular dementia, mild, without behavioral disturbance, psychotic disturbance, mood disturbance, and anxiety: Secondary | ICD-10-CM

## 2020-02-15 DIAGNOSIS — I10 Essential (primary) hypertension: Secondary | ICD-10-CM

## 2020-02-15 DIAGNOSIS — F0150 Vascular dementia without behavioral disturbance: Secondary | ICD-10-CM

## 2020-02-15 DIAGNOSIS — E785 Hyperlipidemia, unspecified: Secondary | ICD-10-CM | POA: Diagnosis not present

## 2020-02-15 DIAGNOSIS — R4189 Other symptoms and signs involving cognitive functions and awareness: Secondary | ICD-10-CM | POA: Insufficient documentation

## 2020-02-15 NOTE — Patient Instructions (Signed)
1.  Continue aspirin 5m daily, blood pressure medication control, cholesterol medication control 2.  Try to work on use of CPAP 3.  1 cup of coffee every night 4.  Mediterranean diet (see below) 5.  Limit use of pain relievers to no more than 2 days out of week to prevent risk of rebound or medication-overuse headache. 6.  Keep headache diary 7.  If headaches get worse, follow up.  Otherwise follow up in one year   Mediterranean Diet A Mediterranean diet refers to food and lifestyle choices that are based on the traditions of countries located on the [employer] This way of eating has been shown to help prevent certain conditions and improve outcomes for people who have chronic diseases, like kidney disease and heart disease. What are tips for following this plan? Lifestyle  Cook and eat meals together with your family, when possible.  Drink enough fluid to keep your urine clear or pale yellow.  Be physically active every day. This includes: ? Aerobic exercise like running or swimming. ? Leisure activities like gardening, walking, or housework.  Get 7-8 hours of sleep each night.  If recommended by your health care provider, drink red wine in moderation. This means 1 glass a day for nonpregnant women and 2 glasses a day for men. A glass of wine equals 5 oz (150 mL). Reading food labels   Check the serving size of packaged foods. For foods such as rice and pasta, the serving size refers to the amount of cooked product, not dry.  Check the total fat in packaged foods. Avoid foods that have saturated fat or trans fats.  Check the ingredients list for added sugars, such as corn syrup. Shopping  At the grocery store, buy most of your food from the areas near the walls of the store. This includes: ? Fresh fruits and vegetables (produce). ? Grains, beans, nuts, and seeds. Some of these may be available in unpackaged forms or large amounts (in bulk). ? Fresh seafood. ? Poultry  and eggs. ? Low-fat dairy products.  Buy whole ingredients instead of prepackaged foods.  Buy fresh fruits and vegetables in-season from local farmers markets.  Buy frozen fruits and vegetables in resealable bags.  If you do not have access to quality fresh seafood, buy precooked frozen shrimp or canned fish, such as tuna, salmon, or sardines.  Buy small amounts of raw or cooked vegetables, salads, or olives from the deli or salad bar at your store.  Stock your pantry so you always have certain foods on hand, such as olive oil, canned tuna, canned tomatoes, rice, pasta, and beans. Cooking  Cook foods with extra-virgin olive oil instead of using butter or other vegetable oils.  Have meat as a side dish, and have vegetables or grains as your main dish. This means having meat in small portions or adding small amounts of meat to foods like pasta or stew.  Use beans or vegetables instead of meat in common dishes like chili or lasagna.  Experiment with different cooking methods. Try roasting or broiling vegetables instead of steaming or sauteing them.  Add frozen vegetables to soups, stews, pasta, or rice.  Add nuts or seeds for added healthy fat at each meal. You can add these to yogurt, salads, or vegetable dishes.  Marinate fish or vegetables using olive oil, lemon juice, garlic, and fresh herbs. Meal planning   Plan to eat 1 vegetarian meal one day each week. Try to work up to 2 vegetarian  meals, if possible.  Eat seafood 2 or more times a week.  Have healthy snacks readily available, such as: ? Vegetable sticks with hummus. ? [place] yogurt. ? Fruit and nut trail mix.  Eat balanced meals throughout the week. This includes: ? Fruit: 2-3 servings a day ? Vegetables: 4-5 servings a day ? Low-fat dairy: 2 servings a day ? Fish, poultry, or lean meat: 1 serving a day ? Beans and legumes: 2 or more servings a week ? Nuts and seeds: 1-2 servings a day ? Whole grains: 6-8  servings a day ? Extra-virgin olive oil: 3-4 servings a day  Limit red meat and sweets to only a few servings a month What are my food choices?  Mediterranean diet ? Recommended  Grains: Whole-grain pasta. Brown rice. Bulgar wheat. Polenta. Couscous. Whole-wheat bread. [place].  Vegetables: Artichokes. Beets. Broccoli. Cabbage. Carrots. Eggplant. Green beans. Chard. Kale. Spinach. Onions. Leeks. Peas. Squash. Tomatoes. Peppers. Radishes.  Fruits: Apples. Apricots. Avocado. Berries. Bananas. Cherries. Dates. Figs. Grapes. Lemons. Melon. Oranges. Peaches. Plums. Pomegranate.  Meats and other protein foods: Beans. Almonds. Sunflower seeds. Pine nuts. Peanuts. [place]. Salmon. Scallops. Shrimp. [place]. Tilapia. Clams. Oysters. Eggs.  Dairy: Low-fat milk. Cheese. Greek yogurt.  Beverages: Water. Red wine. Herbal tea.  Fats and oils: Extra virgin olive oil. Avocado oil. Grape seed oil.  Sweets and desserts: [place] yogurt with honey. Baked apples. Poached pears. Trail mix.  Seasoning and other foods: Basil. Cilantro. Coriander. Cumin. Mint. Parsley. Sage. Rosemary. Tarragon. Garlic. Oregano. Thyme. Pepper. Balsalmic vinegar. Tahini. Hummus. Tomato sauce. Olives. Mushrooms. ? Limit these  Grains: Prepackaged pasta or rice dishes. Prepackaged cereal with added sugar.  Vegetables: Deep fried potatoes (french fries).  Fruits: Fruit canned in syrup.  Meats and other protein foods: Beef. Pork. Lamb. Poultry with skin. Hot dogs. Berniece Salines.  Dairy: Ice cream. Sour cream. Whole milk.  Beverages: Juice. Sugar-sweetened soft drinks. Beer. Liquor and spirits.  Fats and oils: Butter. Canola oil. Vegetable oil. Beef fat (tallow). Lard.  Sweets and desserts: Cookies. Cakes. Pies. Candy.  Seasoning and other foods: Mayonnaise. Premade sauces and marinades. The items listed may not be a complete list. Talk with your dietitian about what dietary choices are right for you. Summary  The  Mediterranean diet includes both food and lifestyle choices.  Eat a variety of fresh fruits and vegetables, beans, nuts, seeds, and whole grains.  Limit the amount of red meat and sweets that you eat.  Talk with your health care provider about whether it is safe for you to drink red wine in moderation. This means 1 glass a day for nonpregnant women and 2 glasses a day for men. A glass of wine equals 5 oz (150 mL). This information is not intended to replace advice given to you by your health care provider. Make sure you discuss any questions you have with your health care provider. Document Revised: 10/20/2015 Document Reviewed: 10/13/2015 Elsevier Patient Education  [place].

## 2020-02-18 ENCOUNTER — Encounter: Payer: Medicare Other | Admitting: Psychology

## 2020-05-05 ENCOUNTER — Ambulatory Visit: Payer: Medicare Other
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
## 2020-05-10 ENCOUNTER — Ambulatory Visit (INDEPENDENT_AMBULATORY_CARE_PROVIDER_SITE_OTHER): Payer: Medicare Other | Admitting: Urology

## 2020-05-10 ENCOUNTER — Encounter: Payer: Self-pay | Admitting: Urology

## 2020-05-10 ENCOUNTER — Other Ambulatory Visit: Payer: Self-pay

## 2020-05-10 DIAGNOSIS — N401 Enlarged prostate with lower urinary tract symptoms: Secondary | ICD-10-CM | POA: Diagnosis not present

## 2020-05-10 DIAGNOSIS — N5201 Erectile dysfunction due to arterial insufficiency: Secondary | ICD-10-CM | POA: Diagnosis not present

## 2020-05-10 DIAGNOSIS — R351 Nocturia: Secondary | ICD-10-CM

## 2020-05-10 LAB — MICROSCOPIC EXAMINATION
Bacteria, UA: NONE SEEN
Epithelial Cells (non renal): NONE SEEN /hpf (ref 0–10)
Renal Epithel, UA: NONE SEEN /hpf
WBC, UA: NONE SEEN /hpf (ref 0–5)

## 2020-05-10 LAB — URINALYSIS, ROUTINE W REFLEX MICROSCOPIC
Bilirubin, UA: NEGATIVE
Glucose, UA: NEGATIVE
Ketones, UA: NEGATIVE
Leukocytes,UA: NEGATIVE
Nitrite, UA: NEGATIVE
Specific Gravity, UA: 1.025 (ref 1.005–1.030)
Urobilinogen, Ur: 0.2 mg/dL (ref 0.2–1.0)
pH, UA: 5.5 (ref 5.0–7.5)

## 2020-05-10 NOTE — Progress Notes (Signed)
Urological Symptom Review  Patient is experiencing the following symptoms: Frequent urination Hard to postpone urination Get up at night to urinate   Review of Systems  Gastrointestinal (upper)  : Negative for upper GI symptoms  Gastrointestinal (lower) : Constipation  Constitutional : Negative for symptoms  Skin: Negative for skin symptoms  Eyes: Negative for eye symptoms  Ear/Nose/Throat : Negative for Ear/Nose/Throat symptoms  Hematologic/Lymphatic: Easy bruising  Cardiovascular : Chest pain  Respiratory : Negative for respiratory symptoms  Endocrine: Negative for endocrine symptoms  Musculoskeletal: Back pain  Neurological: Negative for neurological symptoms  Psychologic: Negative for psychiatric symptoms

## 2020-05-27 ENCOUNTER — Ambulatory Visit: Payer: Medicare Other

## 2020-05-27 DIAGNOSIS — Z0000 Encounter for general adult medical examination without abnormal findings: Secondary | ICD-10-CM

## 2020-06-01 ENCOUNTER — Encounter: Payer: Self-pay | Admitting: Urology

## 2020-06-01 ENCOUNTER — Ambulatory Visit (INDEPENDENT_AMBULATORY_CARE_PROVIDER_SITE_OTHER): Payer: Medicare Other | Admitting: Urology

## 2020-06-01 ENCOUNTER — Other Ambulatory Visit: Payer: Self-pay

## 2020-06-01 VITALS — BP 141/85 | HR 80 | Ht 72.0 in | Wt 233.4 lb

## 2020-06-01 DIAGNOSIS — N401 Enlarged prostate with lower urinary tract symptoms: Secondary | ICD-10-CM | POA: Diagnosis not present

## 2020-06-01 DIAGNOSIS — R351 Nocturia: Secondary | ICD-10-CM

## 2020-06-01 LAB — URINALYSIS, COMPLETE
Bilirubin, UA: NEGATIVE
Glucose, UA: NEGATIVE
Leukocytes,UA: NEGATIVE
Nitrite, UA: NEGATIVE
RBC, UA: NEGATIVE
Specific Gravity, UA: 1.025 (ref 1.005–1.030)
Urobilinogen, Ur: 0.2 mg/dL (ref 0.2–1.0)
pH, UA: 5.5 (ref 5.0–7.5)

## 2020-06-01 LAB — MICROSCOPIC EXAMINATION
Bacteria, UA: NONE SEEN
Epithelial Cells (non renal): NONE SEEN /hpf (ref 0–10)

## 2020-06-01 LAB — BLADDER SCAN AMB NON-IMAGING

## 2020-06-01 NOTE — Progress Notes (Signed)
   06/01/2020 5:03 PM   Bruce [name] [birth year] [id number]  Reason for visit: Follow up BPH, nocturia  HPI: I saw Bruce [name] in urology clinic again today to discuss BPH and HOLEP.  He is regularly followed by Dr. Diona Fanti over at [facility] urology.  I last saw him in July 2021.  He has a very large 200 g prostate on CT, and is undergone a negative microscopic hematuria work-up previously with Dr. Diona Fanti that showed some friable prostate tissue.  He is on max dose Flomax currently.  PVRs have been normal.   His primary complaint is weak stream during the day and urinary frequency, and primarily nocturia 3-5 times per night.  When I last saw him in July 2021 he was consuming 10 cups of coffee, including a full cup right before bed " to prevent headaches overnight."  He continues to consume massive amounts of coffee with 8+ cups per day, including a large cup of coffee right before bedtime.  I again had a very frank conversation with the patient that I think his urinary symptoms are more likely related to his high coffee intake, including a cup right before bedtime.  I would be very hesitant to pursue HOLEP without first trying to have him cut back on his significant coffee/caffeine intake to see if his urinary symptoms improved.  Finally, he may benefit from urodynamics to confirm bladder outlet obstruction prior to undergoing a HOLEP.  -Behavioral strategies discussed extensively, recommended cutting back to less than 3 cups of coffee per day, and stopping after 3 PM -Would recommend urodynamics prior to any outlet procedure -RTC with me 3 months symptom check  [name], MD  [place] [address] [place], [place] [address] [phone]

## 2020-06-01 NOTE — Patient Instructions (Signed)
I think most of your urinary symptoms and getting up overnight to pee is from your very high coffee intake.  Cut back to less than 2 to 3 cups/day, and do not drink coffee or caffeine after 3 PM.  You have a very large prostate.  If your urinary symptoms do not improve with the above strategies, we may need to consider surgery to open up the prostate.  Holmium Laser Enucleation of the Prostate (HoLEP)  HoLEP is a treatment for men with benign prostatic hyperplasia (BPH). The laser surgery removed blockages of urine flow, and is done without any incisions on the body.     What is HoLEP?  HoLEP is a type of laser surgery used to treat obstruction (blockage) of urine flow as a result of benign prostatic hyperplasia (BPH). In men with BPH, the prostate gland is not cancerous, but has become enlarged. An enlarged prostate can result in a number of urinary tract symptoms such as weak urinary stream, difficulty in starting urination, inability to urinate, frequent urination, or getting up at night to urinate.  HoLEP was developed in the 1990's as a more effective and less expensive surgical option for BPH, compared to other surgical options such as laser vaporization(PVP/greenlight laser), transurethral resection of the prostate(TURP), and open simple prostatectomy.   What happens during a HoLEP?  HoLEP requires general anesthesia ("asleep" throughout the procedure).   An antibiotic is given to reduce the risk of infection  A surgical instrument called a resectoscope is inserted through the urethra (the tube that carries urine from the bladder). The resectoscope has a camera that allows the surgeon to view the internal structure of the prostate gland, and to see where the incisions are being made during surgery.  The laser is inserted into the resectoscope and is used to enucleate (free up) the enlarged prostate tissue from the capsule (outer shell) and then to seal up any blood vessels. The tissue  that has been removed is pushed back into the bladder.  A morcellator is placed through the resectoscope, and is used to suction out the prostate tissue that has been pushed into the bladder.  When the prostate tissue has been removed, the resectoscope is removed, and a foley catheter is placed to allow healing and drain the urine from the bladder.     What happens after a HoLEP?  More than 90% of patients go home the same day a few hours after surgery. Less than 10% will be admitted to the hospital overnight for observation to monitor the urine, or if they have other medical problems.  Fluid is flushed through the catheter for about 1 hour after surgery to clear any blood from the urine. It is normal to have some blood in the urine after surgery. The need for blood transfusion is extremely rare.  Eating and drinking are permitted after the procedure once the patient has fully awakened from anesthesia.  The catheter is usually removed 2-3 days after surgery- the patient will come to clinic to have the catheter removed and make sure they can urinate on their own.  It is very important to drink lots of fluids after surgery for one week to keep the bladder flushed.  At first, there may be some burning with urination, but this typically improved within a few hours to days. Most patients do not have a significant amount of pain, and narcotic pain medications are rarely needed.  Symptoms of urinary frequency, urgency, and even leakage are NORMAL for  the first few weeks after surgery as the bladder adjusts after having to work hard against blockage from the prostate for many years. This will improve, but can sometimes take several months.  The use of pelvic floor exercises (Kegel exercises) can help improve problems with urinary incontinence.   After catheter removal, patients will be seen at 6 weeks and 6 months for symptom check  No heavy lifting for at least 2-3 weeks after surgery, however  patients can walk and do light activities the first day after surgery. Return to work time depends on occupation.    What are the advantages of HoLEP?  HoLEP has been studied in many different parts of the world and has been shown to be a safe and effective procedure. Although there are many types of BPH surgeries available, HoLEP offers a unique advantage in being able to remove a large amount of tissue without any incisions on the body, even in very large prostates, while decreasing the risk of bleeding and providing tissue for pathology (to look for cancer). This decreases the need for blood transfusions during surgery, minimizes hospital stay, and reduces the risk of needing repeat treatment.  What are the side effects of HoLEP?  Temporary burning and bleeding during urination. Some blood may be seen in the urine for weeks after surgery and is part of the healing process.  Urinary incontinence (inability to control urine flow) is expected in all patients immediately after surgery and they should wear pads for the first few days/weeks. This typically improves over the course of several weeks. Performing Kegel exercises can help decrease leakage from stress maneuvers such as coughing, sneezing, or lifting. The rate of long term leakage is very low. Patients may also have leakage with urgency and this may be treated with medication. The risk of urge incontinence can be dependent on several factors including age, prostate size, symptoms, and other medical problems.  Retrograde ejaculation or "backwards ejaculation." In 75% of cases, the patient will not see any fluid during ejaculation after surgery.  Erectile function is generally not significantly affected.   What are the risks of HoLEP?  Injury to the urethra or development of scar tissue at a later date  Injury to the capsule of the prostate (typically treated with longer catheterization).  Injury to the bladder or ureteral orifices  (where the urine from the kidney drains out)  Infection of the bladder, testes, or kidneys  Return of urinary obstruction at a later date requiring another operation (<2%)  Need for blood transfusion or re-operation due to bleeding  Failure to relieve all symptoms and/or need for prolonged catheterization after surgery  5-15% of patients are found to have previously undiagnosed prostate cancer in their specimen. Prostate cancer can be treated after HoLEP.  Standard risks of anesthesia including blood clots, heart attacks, etc  When should I call my doctor?  Fever over 101.3 degrees  Inability to urinate, or large blood clots in the urine

## 2020-06-02 DIAGNOSIS — M1711 Unilateral primary osteoarthritis, right knee: Secondary | ICD-10-CM | POA: Diagnosis not present

## 2020-06-02 DIAGNOSIS — M25561 Pain in right knee: Secondary | ICD-10-CM | POA: Diagnosis not present

## 2020-08-12 DIAGNOSIS — H10013 Acute follicular conjunctivitis, bilateral: Secondary | ICD-10-CM | POA: Diagnosis not present

## 2020-08-18 DIAGNOSIS — M1711 Unilateral primary osteoarthritis, right knee: Secondary | ICD-10-CM | POA: Diagnosis not present

## 2020-08-24 ENCOUNTER — Telehealth: Payer: Self-pay | Admitting: Family Medicine

## 2020-08-24 NOTE — Telephone Encounter (Signed)
Copied from [place] [phone]. Topic: Medicare AWV >> Aug 24, 2020 10:35 AM Harris-Coley, Hannah Beat wrote: Reason for CRM: Left message for patient to schedule Annual Wellness Visit.  Please schedule with Nurse Health Advisor Charlott Rakes, RN at [facility].

## 2020-08-31 ENCOUNTER — Other Ambulatory Visit: Payer: Self-pay

## 2020-08-31 ENCOUNTER — Encounter: Payer: Self-pay | Admitting: Urology

## 2020-08-31 ENCOUNTER — Ambulatory Visit: Payer: Medicare Other | Admitting: Urology

## 2020-08-31 VITALS — BP 136/86 | HR 80 | Ht 71.0 in | Wt 237.0 lb

## 2020-08-31 DIAGNOSIS — R351 Nocturia: Secondary | ICD-10-CM

## 2020-08-31 DIAGNOSIS — N401 Enlarged prostate with lower urinary tract symptoms: Secondary | ICD-10-CM

## 2020-08-31 DIAGNOSIS — R399 Unspecified symptoms and signs involving the genitourinary system: Secondary | ICD-10-CM | POA: Diagnosis not present

## 2020-08-31 LAB — BLADDER SCAN AMB NON-IMAGING

## 2020-08-31 NOTE — Patient Instructions (Signed)
Contact your primary care physician to try and get a new CPAP machine for sleep apnea.  Patients with uncontrolled sleep apnea often have problems with excessive urination overnight, and with a CPAP machine that works this can completely resolve.

## 2020-08-31 NOTE — Progress Notes (Signed)
   08/31/2020 5:52 PM   [name] [birth year] [id number]  Reason for visit: Follow up BPH, nocturia   HPI: I saw [name] in urology clinic again today to discuss BPH and HOLEP.  He is regularly followed by Dr. Diona Fanti over at [facility] urology.   He has a very large 200 g prostate on CT, and has undergone a negative microscopic hematuria work-up previously with Dr. Diona Fanti that showed some friable prostate tissue.  He is on max dose Flomax currently.  PVRs have been normal.   Urinary complaints are urinary frequency and urgency during the day, but primarily nocturia 4-5 times per night(about every hour overnight with significant urgency).   When I last saw him in March 2022 he was consuming at least 8 cups of coffee a day including a large cup of coffee right before bedtime.  At that point we discussed behavioral strategies prior to considering HOLEP.  He has cut back to about 2 cups of coffee a day, including a cup of coffee right before bed to " prevent headaches."  He also has severe sleep apnea and is noncompliant with CPAP machine.  We discussed at length relationship between sleep apnea and nocturia, and I encouraged him to reach out to his PCP to try different machine, as I think this will improve his nocturia.  I again had a frank conversation with the patient that I think there are number of behavioral strategies he can focus on prior to pursuing HOLEP, and I think there is a possibility he could actually be worse after surgery if he has more of an overactive picture and uncontrolled sleep apnea.  Would still recommend urodynamics prior to any surgical intervention to confirm bladder outlet obstruction.  RTC 3 to 4 months virtual visit for symptom check, see if compliant with CPAP at that time, consider urodynamics if compliant with CPAP and persistent urinary symptoms/nocturia   [name], [place] [address], [place], [place] [address] [phone]

## 2020-10-20 ENCOUNTER — Other Ambulatory Visit: Payer: Self-pay

## 2020-10-20 ENCOUNTER — Ambulatory Visit (INDEPENDENT_AMBULATORY_CARE_PROVIDER_SITE_OTHER): Payer: Medicare Other | Admitting: Physician Assistant

## 2020-10-20 VITALS — HR 87 | Temp 98.2°F | Ht 71.0 in | Wt 232.2 lb

## 2020-10-20 DIAGNOSIS — R21 Rash and other nonspecific skin eruption: Secondary | ICD-10-CM

## 2020-10-20 MED ORDER — CLOTRIMAZOLE-BETAMETHASONE 1-0.05 % EX CREA: 1.0000 "application " | TOPICAL_CREAM | Freq: Every day | CUTANEOUS | 0 refills | Status: DC
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
## 2020-10-20 NOTE — Patient Instructions (Signed)
Good to meet you.  Please apply the cream I prescribed at bedtime and wash hands after use. You may take Benadryl as needed for the itching as well. Calamine lotion or over the counter anti-itch spray may help during the day time. Keep cool and hydrated. Try not to scratch!  Stop using the Neosporin.

## 2020-11-03 ENCOUNTER — Encounter: Payer: Self-pay | Admitting: Physician Assistant

## 2020-11-03 MED ORDER — CLOTRIMAZOLE-BETAMETHASONE 1-0.05 % EX CREA: 1.0000 "application " | TOPICAL_CREAM | Freq: Every day | CUTANEOUS | 0 refills | Status: DC

## 2020-11-03 NOTE — Telephone Encounter (Signed)
Please see message and advise 

## 2020-11-03 NOTE — Telephone Encounter (Signed)
Spoke to pt's daughter [name], told her calling about My chart message sent in regards to rash and refill on medication. Told her will refill prescription for him and have him continue for another 2 weeks, then recheck in person if worse or no better? Adria verbalized understanding and said will let pt know. She said the rash is no worse, lighter in color, cream helps with itching. Told her okay will send Rx now. Adria verbalized understanding.

## 2020-12-21 ENCOUNTER — Other Ambulatory Visit: Payer: Self-pay | Admitting: Family Medicine

## 2020-12-29 ENCOUNTER — Other Ambulatory Visit: Payer: Self-pay

## 2020-12-29 ENCOUNTER — Telehealth: Payer: Self-pay | Admitting: Urology

## 2020-12-29 ENCOUNTER — Ambulatory Visit (INDEPENDENT_AMBULATORY_CARE_PROVIDER_SITE_OTHER): Payer: Medicare Other | Admitting: Family Medicine

## 2020-12-29 ENCOUNTER — Ambulatory Visit: Payer: Medicare Other | Admitting: Urology

## 2020-12-29 ENCOUNTER — Encounter: Payer: Self-pay | Admitting: Family Medicine

## 2020-12-29 VITALS — BP 130/70 | HR 86 | Temp 97.9°F | Ht 70.98 in | Wt 237.6 lb

## 2020-12-29 DIAGNOSIS — E118 Type 2 diabetes mellitus with unspecified complications: Secondary | ICD-10-CM | POA: Diagnosis not present

## 2020-12-29 DIAGNOSIS — I428 Other cardiomyopathies: Secondary | ICD-10-CM | POA: Diagnosis not present

## 2020-12-29 DIAGNOSIS — E039 Hypothyroidism, unspecified: Secondary | ICD-10-CM

## 2020-12-29 DIAGNOSIS — N401 Enlarged prostate with lower urinary tract symptoms: Secondary | ICD-10-CM | POA: Diagnosis not present

## 2020-12-29 DIAGNOSIS — N3281 Overactive bladder: Secondary | ICD-10-CM

## 2020-12-29 DIAGNOSIS — D519 Vitamin B12 deficiency anemia, unspecified: Secondary | ICD-10-CM | POA: Diagnosis not present

## 2020-12-29 DIAGNOSIS — N138 Other obstructive and reflux uropathy: Secondary | ICD-10-CM | POA: Diagnosis not present

## 2020-12-29 MED ORDER — CLOTRIMAZOLE-BETAMETHASONE 1-0.05 % EX CREA: 1.0000 "application " | TOPICAL_CREAM | Freq: Two times a day (BID) | CUTANEOUS | 1 refills | Status: AC

## 2020-12-29 NOTE — Progress Notes (Signed)
Virtual Visit via Telephone Note  I connected with Bruce [name] on 12/29/20 at  1:00 PM EDT by telephone and verified that I am speaking with the correct person using two identifiers.   Patient location: Home Provider location: [facility] Urologic Office   I discussed the limitations, risks, security and privacy concerns of performing an evaluation and management service by telephone and the availability of in person appointments. We discussed the impact of the COVID-19 pandemic on the healthcare system, and the importance of social distancing and reducing patient and provider exposure. I also discussed with the patient that there may be a patient responsible charge related to this service. The patient expressed understanding and agreed to proceed.  Reason for visit: Bladder symptoms  History of Present Illness:   Assessment and Plan: Comorbid 70 year old male with ~200g prostate on CT and history of negative microscopic hematuria work-up currently on maximum dose Flomax.  He is primary urinary complaints are urinary frequency and urgency during the day, but mostly nocturia 4-5 times overnight with significant urgency.  PVRs have been normal.  He drinks a significant amount of coffee during the day, and also has severe sleep apnea and is noncompliant with his CPAP machine.  I previously had multiple conversations with him that I think his nocturia is primarily from his own managed sleep apnea, and have recommended CPAP to see if his nocturia improves.  He has continued to be noncompliant and has not been willing to try CPAP again.  He needs to try CPAP to see if his nocturia improves prior to any kind of urologic surgical intervention.  He may also need to have urodynamics prior to confirm this is truly outlet obstruction compared to overactive symptoms with his high intake of caffeine.  Follow Up: RTC 6 months with PVR I stressed the importance extensively of resuming CPAP, the  relationship between nocturia and uncontrolled sleep apnea   I discussed the assessment and treatment plan with the patient. The patient was provided an opportunity to ask questions and all were answered. The patient agreed with the plan and demonstrated an understanding of the instructions.   The patient was advised to call back or seek an in-person evaluation if the symptoms worsen or if the condition fails to improve as anticipated.  I provided 12 minutes of non-face-to-face time during this encounter.   [name], MD

## 2020-12-29 NOTE — Progress Notes (Signed)
- Phone [phone] In person visit   Subjective:   Bruce [name] is a 70 y.o. year old very pleasant male patient who presents for/with See problem oriented charting Chief Complaint  Patient presents with   Labs Only    Pt here to get A1C checked    Rash    Area on pt right calf muscle. Red , sometimes itchy and mildly painfu;. Previously prescribed Lotrisone   This visit occurred during the SARS-CoV-2 public health emergency.  Safety protocols were in place, including screening questions prior to the visit, additional usage of staff PPE, and extensive cleaning of exam room while observing appropriate contact time as indicated for disinfecting solutions.   Past Medical History-  Patient Active Problem List   Diagnosis Date Noted   Crohn's ileitis 03/17/2012    Priority: 1.   Myalgia due to statin 10/27/2019    Priority: 2.   Aortic atherosclerosis 03/30/2017    Priority: 2.   Osteoarthritis of left knee 06/21/2016    Priority: 2.   Former smoker 12/02/2015    Priority: 2.   Iron deficiency anemia 09/03/2013    Priority: 2.   B12 deficiency anemia 09/03/2013    Priority: 2.   Hyperlipidemia 12/17/2012    Priority: 2.   Essential hypertension 03/17/2012    Priority: 2.   Sleep apnea 03/17/2012    Priority: 2.   Hypothyroidism 03/17/2012    Priority: 2.   BPH associated with nocturia 03/17/2012    Priority: 2.   Erectile dysfunction 04/01/2018    Priority: 3.   GERD (gastroesophageal reflux disease) 04/01/2018    Priority: 3.   Hoarseness 06/27/2017    Priority: 3.   Onychomycosis 06/21/2016    Priority: 3.   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   Family history of colon cancer 01/26/2014    Priority: 3.   Constipation 05/20/2012    Priority: 3.   Obesity 03/17/2012    Priority: 3.   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     carvedilol (COREG) 6.25 MG tablet Take 6.25 mg by mouth 2 (two) times daily.     Garlic 8657 MG CAPS Take by mouth.     levothyroxine (SYNTHROID) 200 MCG tablet TAKE 1 TABLET (200 MCG TOTAL) BY MOUTH DAILY BEFORE BREAKFAST. 90 tablet 3   Multiple Vitamins-Minerals (ONE-A-DAY MENS HEALTH FORMULA) TABS Take by mouth.     polyethylene glycol (MIRALAX / GLYCOLAX) 17 g packet Take 17 g by mouth daily.     potassium gluconate 595 (99 K) MG TABS tablet Take 595 mg by mouth.     tamsulosin (FLOMAX) 0.4 MG CAPS capsule 0.4 mg 2 (two) times daily.     vitamin B-12 (CYANOCOBALAMIN) 1000 MCG tablet Take 1,000 mcg by mouth daily.     clotrimazole-betamethasone (LOTRISONE) cream Apply 1 application topically 2 (two) times daily for 15 days. Max for 2 weeks  90 g 1   No current facility-administered medications for this visit.     Objective:  BP 130/70   Pulse 86   Temp 97.9 F (36.6 C)   Ht 5' 10.98" (1.803 m)   Wt 237 lb 9.6 oz (107.8 kg)   SpO2 96%   BMI 33.15 kg/m  Gen: NAD, resting comfortably CV: RRR no murmurs rubs or gallops Lungs: CTAB no crackles, wheeze, rhonchi Abdomen: soft/nontender/nondistended/normal bowel sounds. No rebound or guarding.  Ext: trace edema Skin: warm, dry Neuro: hard of hearing per baseline    Assessment and Plan   #social update- new GF who is rather sedentary and can cook well- he thinks this is contributing to weight gain  # Diabetes- improved after weight loss S: Medication:none at present  CBGs- does not check Exercise and diet- not  exercising, weight up 8 lbs form last year Lab Results  Component Value Date   HGBA1C 5.5 10/28/2019   HGBA1C 5.2 04/21/2019   HGBA1C 5.9 02/26/2019  A/P: hopefully stable- update a1c today. Continue withoutmeds for now  #prior DVT after J+ J- has done fine with moderna since then  #hyperlipidemia/aortic atherosclerosis/statin myalgia/coronary calcium S: Medication: aspirin 15m Had been on lovastatin but had severe stiffness and trouble getting out of bed- only on garlic pills now. Also had mylagias and memory loss on atorvastatin 20 mg.  History of hypnic headaches- does well on carvedilol Lab Results  Component Value Date   CHOL 136 10/28/2019   HDL 41 10/28/2019   LDLCALC 72 10/28/2019   LDLDIRECT 82.0 11/02/2016   TRIG 144 10/28/2019   CHOLHDL 3.3 10/28/2019   A/P: Cholesterol mildly poorly controlled last year-prefer LDL under 70-he has been statin intolerant in the past due to memory loss and muscle aches and severe stiffness-might be worth trying rosuvastatin 5 mg once a week but if does not tolerate permanently discontinue.  Okay to continue garlic.  With coronary calcium and aortic atherosclerosis I think continuing aspirin is reasonable  #hypothyroidism S: compliant On thyroid medication-levothyroxine 200 mcg daily  Lab Results  Component Value Date   TSH 2.18 10/28/2019   A/P:hopefully stable- update tsh today. Continue current meds for now   # B12 deficiency S: Current treatment/medication (oral vs. IM): 1000 mcg daily  Lab Results  Component Value Date   VINOMVEHM09 47008/25/2021  A/P: hopefully stable- update b12 today. Continue current meds for now   #Rash S:rash on right lower - responds to cream that was given on 10/20/20. Started off extremely purple and itchy- this gradually resolved over 3-4 weeks. Restarted last week and is getting worse but not itching yet.  ROS-not ill appearing, no fever/chills. No new medications. Not immunocompromised. No mucus  membrane involvement.  A/P: Unclear cause of rash-possibly eczematous but may have fungal element given response to antifungal in past-the fact this resolved completely with Lotrisone cream is reassuring I am willing to retry this but if continues to have recurrent issues may need to refer to dermatology in the long run  Recommended follow up: keep march visit Future Appointments  Date Time Provider DVickery 02/14/2021  1:30 PM JPieter Partridge DO [facility] None  05/03/2021  1:45 PM SBilley Co MD [facility] None  05/17/2021  2:40 PM HMarin Olp MD [name]   Lab/Order associations:   ICD-10-CM   1. Type II diabetes mellitus with manifestations  E11.8 CBC with Differential/Platelet    Comprehensive metabolic panel    Hemoglobin A1c  Lipid panel    Microalbumin / creatinine urine ratio    2. Anemia due to vitamin B12 deficiency, unspecified B12 deficiency type  D51.9 Vitamin B12    3. Hypothyroidism, unspecified type  E03.9 TSH    4. Nonischemic cardiomyopathy  I42.8       Meds ordered this encounter  Medications   clotrimazole-betamethasone (LOTRISONE) cream    Sig: Apply 1 application topically 2 (two) times daily for 15 days. Max for 2 weeks    Dispense:  90 g    Refill:  1    I,Harris Phan,acting as a scribe for Garret Reddish, MD.,have documented all relevant documentation on the behalf of Garret Reddish, MD,as directed by  Garret Reddish, MD while in the presence of Garret Reddish, MD.  I, Garret Reddish, MD, have reviewed all documentation for this visit. The documentation on 12/29/20 for the exam, diagnosis, procedures, and orders are all accurate and complete.   Return precautions advised.  Garret Reddish, MD

## 2020-12-29 NOTE — Patient Instructions (Addendum)
Health Maintenance Due  Topic Date Due   Zoster Vaccines- Shingrix (1 of 2) -Please check with your pharmacy to see if they have the shingrix vaccine. If they do- please get this immunization and update [place] by phone call or mychart with dates you receive the vaccine  Never done   OPHTHALMOLOGY EXAM  - Sign release of information at the check out desk for Diabetic eye exam.  03/02/2020   COVID-19 Vaccine (3 - Booster for [employer] series) - Recommend getting Omicron/Bivalent booster only at your local pharmacy! Please let us know when you have received this vaccination.  04/25/2020   Please stop by lab before you go If you have mychart- we will send your results within 3 business days of [place] receiving them.  If you do not have mychart- we will call you about results within 5 business days of [place] receiving them.  *please also note that you will see labs on mychart as soon as they post. I will later go in and write notes on them- will say "notes from Dr. Yong Channel"  Restart new prescription of lotrisone cream again and use twice daily for 2 weeks (  I recommend taking a week break in between any 2-week trial) - if your symptoms are not improving or you have new/worsening symptoms for your rash, please let me know and we may need to refer to dermatology for the long run.  Your weight is up by 8 lbs from last year, I encourage you to start getting daily regular exercise - at least 150 minutes of regular exercise per week. Also I encourage you to start making efforts in healthier eating habits.  Recommended follow up: keep march visit

## 2020-12-30 LAB — VITAMIN B12: Vitamin B-12: >1550 pg/mL — ABNORMAL HIGH (ref 211–911)

## 2020-12-30 LAB — CBC WITH DIFFERENTIAL/PLATELET
Basophils Absolute: 0.1 10*3/uL (ref 0.0–0.1)
Basophils Relative: 0.8 % (ref 0.0–3.0)
Eosinophils Absolute: 0.4 10*3/uL (ref 0.0–0.7)
Eosinophils Relative: 5.7 % — ABNORMAL HIGH (ref 0.0–5.0)
HCT: 38.7 % — ABNORMAL LOW (ref 39.0–52.0)
Hemoglobin: 13 g/dL (ref 13.0–17.0)
Lymphocytes Relative: 28.1 % (ref 12.0–46.0)
Lymphs Abs: 2.2 10*3/uL (ref 0.7–4.0)
MCHC: 33.6 g/dL (ref 30.0–36.0)
MCV: 86.5 fl (ref 78.0–100.0)
Monocytes Absolute: 0.8 10*3/uL (ref 0.1–1.0)
Monocytes Relative: 10.8 % (ref 3.0–12.0)
Neutro Abs: 4.2 10*3/uL (ref 1.4–7.7)
Neutrophils Relative %: 54.6 % (ref 43.0–77.0)
Platelets: 444 10*3/uL — ABNORMAL HIGH (ref 150.0–400.0)
RBC: 4.47 Mil/uL (ref 4.22–5.81)
RDW: 14.5 % (ref 11.5–15.5)
WBC: 7.8 10*3/uL (ref 4.0–10.5)

## 2020-12-30 LAB — COMPREHENSIVE METABOLIC PANEL WITH GFR
ALT: 16 U/L (ref 0–53)
AST: 24 U/L (ref 0–37)
Albumin: 3.9 g/dL (ref 3.5–5.2)
Alkaline Phosphatase: 76 U/L (ref 39–117)
BUN: 13 mg/dL (ref 6–23)
CO2: 26 meq/L (ref 19–32)
Calcium: 8.7 mg/dL (ref 8.4–10.5)
Chloride: 105 meq/L (ref 96–112)
Creatinine, Ser: 1.3 mg/dL (ref 0.40–1.50)
GFR: 55.23 mL/min — ABNORMAL LOW
Glucose, Bld: 92 mg/dL (ref 70–99)
Potassium: 3.5 meq/L (ref 3.5–5.1)
Sodium: 139 meq/L (ref 135–145)
Total Bilirubin: 0.5 mg/dL (ref 0.2–1.2)
Total Protein: 7 g/dL (ref 6.0–8.3)

## 2020-12-30 LAB — LIPID PANEL
Cholesterol: 125 mg/dL (ref 0–200)
HDL: 35.4 mg/dL — ABNORMAL LOW
LDL Cholesterol: 67 mg/dL (ref 0–99)
NonHDL: 89.25
Total CHOL/HDL Ratio: 4
Triglycerides: 109 mg/dL (ref 0.0–149.0)
VLDL: 21.8 mg/dL (ref 0.0–40.0)

## 2020-12-30 LAB — HEMOGLOBIN A1C: Hgb A1c MFr Bld: 5.8 % (ref 4.6–6.5)

## 2020-12-30 LAB — MICROALBUMIN / CREATININE URINE RATIO
Creatinine,U: 183.3 mg/dL
Microalb Creat Ratio: 10.9 mg/g (ref 0.0–30.0)
Microalb, Ur: 19.9 mg/dL — ABNORMAL HIGH (ref 0.0–1.9)

## 2020-12-30 LAB — TSH: TSH: 3.64 u[IU]/mL (ref 0.35–5.50)

## 2021-01-06 DIAGNOSIS — I428 Other cardiomyopathies: Secondary | ICD-10-CM | POA: Diagnosis not present

## 2021-01-06 DIAGNOSIS — I2584 Coronary atherosclerosis due to calcified coronary lesion: Secondary | ICD-10-CM | POA: Diagnosis not present

## 2021-01-06 DIAGNOSIS — I2510 Atherosclerotic heart disease of native coronary artery without angina pectoris: Secondary | ICD-10-CM | POA: Diagnosis not present

## 2021-01-06 DIAGNOSIS — I10 Essential (primary) hypertension: Secondary | ICD-10-CM | POA: Diagnosis not present

## 2021-02-10 DIAGNOSIS — Z20822 Contact with and (suspected) exposure to covid-19: Secondary | ICD-10-CM | POA: Diagnosis not present

## 2021-02-11 DIAGNOSIS — Z20822 Contact with and (suspected) exposure to covid-19: Secondary | ICD-10-CM | POA: Diagnosis not present

## 2021-02-12 DIAGNOSIS — Z20822 Contact with and (suspected) exposure to covid-19: Secondary | ICD-10-CM | POA: Diagnosis not present
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
## 2021-02-14 ENCOUNTER — Telehealth (INDEPENDENT_AMBULATORY_CARE_PROVIDER_SITE_OTHER): Payer: Medicare Other | Admitting: Neurology

## 2021-02-14 ENCOUNTER — Encounter: Payer: Self-pay | Admitting: Neurology

## 2021-02-14 ENCOUNTER — Other Ambulatory Visit: Payer: Self-pay

## 2021-02-14 VITALS — Ht 72.0 in | Wt 235.0 lb

## 2021-02-14 DIAGNOSIS — G4481 Hypnic headache: Secondary | ICD-10-CM

## 2021-02-14 DIAGNOSIS — F01A0 Vascular dementia, mild, without behavioral disturbance, psychotic disturbance, mood disturbance, and anxiety: Secondary | ICD-10-CM

## 2021-02-14 NOTE — Patient Instructions (Signed)
Continue drinking cup of coffee at night before bed to prevent headache  Mediterranean Diet A Mediterranean diet refers to food and lifestyle choices that are based on the traditions of countries located on the [employer]. It focuses on eating more fruits, vegetables, whole grains, beans, nuts, seeds, and heart-healthy fats, and eating less dairy, meat, eggs, and processed foods with added sugar, salt, and fat. This way of eating has been shown to help prevent certain conditions and improve outcomes for people who have chronic diseases, like kidney disease and heart disease. What are tips for following this plan? Reading food labels Check the serving size of packaged foods. For foods such as rice and pasta, the serving size refers to the amount of cooked product, not dry. Check the total fat in packaged foods. Avoid foods that have saturated fat or trans fats. Check the ingredient list for added sugars, such as corn syrup. Shopping  Buy a variety of foods that offer a balanced diet, including: Fresh fruits and vegetables (produce). Grains, beans, nuts, and seeds. Some of these may be available in unpackaged forms or large amounts (in bulk). Fresh seafood. Poultry and eggs. Low-fat dairy products. Buy whole ingredients instead of prepackaged foods. Buy fresh fruits and vegetables in-season from local farmers markets. Buy plain frozen fruits and vegetables. If you do not have access to quality fresh seafood, buy precooked frozen shrimp or canned fish, such as tuna, salmon, or sardines. Stock your pantry so you always have certain foods on hand, such as olive oil, canned tuna, canned tomatoes, rice, pasta, and beans. Cooking Cook foods with extra-virgin olive oil instead of using butter or other vegetable oils. Have meat as a side dish, and have vegetables or grains as your main dish. This means having meat in small portions or adding small amounts of meat to foods like pasta or  stew. Use beans or vegetables instead of meat in common dishes like chili or lasagna. Experiment with different cooking methods. Try roasting, broiling, steaming, and sauting vegetables. Add frozen vegetables to soups, stews, pasta, or rice. Add nuts or seeds for added healthy fats and plant protein at each meal. You can add these to yogurt, salads, or vegetable dishes. Marinate fish or vegetables using olive oil, lemon juice, garlic, and fresh herbs. Meal planning Plan to eat one vegetarian meal one day each week. Try to work up to two vegetarian meals, if possible. Eat seafood two or more times a week. Have healthy snacks readily available, such as: Vegetable sticks with hummus. Greek yogurt. Fruit and nut trail mix. Eat balanced meals throughout the week. This includes: Fruit: 2-3 servings a day. Vegetables: 4-5 servings a day. Low-fat dairy: 2 servings a day. Fish, poultry, or lean meat: 1 serving a day. Beans and legumes: 2 or more servings a week. Nuts and seeds: 1-2 servings a day. Whole grains: 6-8 servings a day. Extra-virgin olive oil: 3-4 servings a day. Limit red meat and sweets to only a few servings a month. Lifestyle  Cook and eat meals together with your family, when possible. Drink enough fluid to keep your urine pale yellow. Be physically active every day. This includes: Aerobic exercise like running or swimming. Leisure activities like gardening, walking, or housework. Get 7-8 hours of sleep each night. If recommended by your health care provider, drink red wine in moderation. This means 1 glass a day for nonpregnant women and 2 glasses a day for men. A glass of wine equals 5 oz (150 mL).  What foods should I eat? Fruits Apples. Apricots. Avocado. Berries. Bananas. Cherries. Dates. Figs. Grapes. Lemons. Melon. Oranges. Peaches. Plums. Pomegranate. Vegetables Artichokes. Beets. Broccoli. Cabbage. Carrots. Eggplant. Green beans. Chard. Kale. Spinach. Onions.  Leeks. Peas. Squash. Tomatoes. Peppers. Radishes. Grains Whole-grain pasta. Brown rice. Bulgur wheat. Polenta. Couscous. Whole-wheat bread. [place]. Meats and other proteins Beans. Almonds. Sunflower seeds. Pine nuts. Peanuts. [place]. Salmon. Scallops. Shrimp. [place]. Tilapia. Clams. Oysters. Eggs. Poultry without skin. Dairy Low-fat milk. Cheese. Greek yogurt. Fats and oils Extra-virgin olive oil. Avocado oil. Grapeseed oil. Beverages Water. Red wine. Herbal tea. Sweets and desserts Greek yogurt with honey. Baked apples. Poached pears. Trail mix. Seasonings and condiments Basil. Cilantro. Coriander. Cumin. Mint. Parsley. Sage. Rosemary. Tarragon. Garlic. Oregano. Thyme. Pepper. Balsamic vinegar. Tahini. Hummus. Tomato sauce. Olives. Mushrooms. The items listed above may not be a complete list of foods and beverages you can eat. Contact a dietitian for more information. What foods should I limit? This is a list of foods that should be eaten rarely or only on special occasions. Fruits Fruit canned in syrup. Vegetables Deep-fried potatoes (french fries). Grains Prepackaged pasta or rice dishes. Prepackaged cereal with added sugar. Prepackaged snacks with added sugar. Meats and other proteins Beef. Pork. Lamb. Poultry with skin. Hot dogs. Berniece Salines. Dairy Ice cream. Sour cream. Whole milk. Fats and oils Butter. Canola oil. Vegetable oil. Beef fat (tallow). Lard. Beverages Juice. Sugar-sweetened soft drinks. Beer. Liquor and spirits. Sweets and desserts Cookies. Cakes. Pies. Candy. Seasonings and condiments Mayonnaise. Pre-made sauces and marinades. The items listed above may not be a complete list of foods and beverages you should limit. Contact a dietitian for more information. Summary The Mediterranean diet includes both food and lifestyle choices. Eat a variety of fresh fruits and vegetables, beans, nuts, seeds, and whole grains. Limit the amount of red meat and sweets that you  eat. If recommended by your health care provider, drink red wine in moderation. This means 1 glass a day for nonpregnant women and 2 glasses a day for men. A glass of wine equals 5 oz (150 mL). This information is not intended to replace advice given to you by your health care provider. Make sure you discuss any questions you have with your health care provider. Document Revised: 03/27/2019 Document Reviewed: 01/22/2019 Elsevier Patient Education  2022 [employer].

## 2021-03-02 ENCOUNTER — Encounter: Payer: Self-pay | Admitting: Family Medicine

## 2021-03-06 DIAGNOSIS — Z20822 Contact with and (suspected) exposure to covid-19: Secondary | ICD-10-CM | POA: Diagnosis not present

## 2021-03-07 DIAGNOSIS — Z20822 Contact with and (suspected) exposure to covid-19: Secondary | ICD-10-CM | POA: Diagnosis not present

## 2021-03-08 ENCOUNTER — Ambulatory Visit (INDEPENDENT_AMBULATORY_CARE_PROVIDER_SITE_OTHER): Payer: Medicare Other | Admitting: Family Medicine

## 2021-03-08 ENCOUNTER — Other Ambulatory Visit: Payer: Self-pay

## 2021-03-08 ENCOUNTER — Encounter: Payer: Self-pay | Admitting: Family Medicine

## 2021-03-08 VITALS — BP 140/80 | HR 80 | Temp 98.1°F | Ht 70.98 in | Wt 230.5 lb

## 2021-03-08 DIAGNOSIS — R070 Pain in throat: Secondary | ICD-10-CM

## 2021-03-08 DIAGNOSIS — R221 Localized swelling, mass and lump, neck: Secondary | ICD-10-CM | POA: Diagnosis not present

## 2021-03-08 DIAGNOSIS — R490 Dysphonia: Secondary | ICD-10-CM

## 2021-03-08 DIAGNOSIS — Z20822 Contact with and (suspected) exposure to covid-19: Secondary | ICD-10-CM | POA: Diagnosis not present

## 2021-03-08 LAB — CBC
HCT: 38.7 % — ABNORMAL LOW (ref 39.0–52.0)
Hemoglobin: 12.6 g/dL — ABNORMAL LOW (ref 13.0–17.0)
MCHC: 32.4 g/dL (ref 30.0–36.0)
MCV: 83.5 fl (ref 78.0–100.0)
Platelets: 486 10*3/uL — ABNORMAL HIGH (ref 150.0–400.0)
RBC: 4.64 Mil/uL (ref 4.22–5.81)
RDW: 15.6 % — ABNORMAL HIGH (ref 11.5–15.5)
WBC: 7.2 10*3/uL (ref 4.0–10.5)

## 2021-03-08 LAB — T3, FREE: T3, Free: 3.9 pg/mL (ref 2.3–4.2)

## 2021-03-08 LAB — TSH: TSH: 1.45 u[IU]/mL (ref 0.35–5.50)

## 2021-03-08 LAB — T4, FREE: Free T4: 1.25 ng/dL (ref 0.60–1.60)

## 2021-03-08 NOTE — Patient Instructions (Signed)
°  You can take tylenol for pain/fevers If worsening symptoms, let us know or go to the Emergency room
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
## 2021-03-09 LAB — COMPREHENSIVE METABOLIC PANEL
ALT: 21 U/L (ref 0–53)
AST: 26 U/L (ref 0–37)
Albumin: 3.8 g/dL (ref 3.5–5.2)
Alkaline Phosphatase: 92 U/L (ref 39–117)
BUN: 15 mg/dL (ref 6–23)
CO2: 25 mEq/L (ref 19–32)
Calcium: 8.9 mg/dL (ref 8.4–10.5)
Chloride: 107 mEq/L (ref 96–112)
Creatinine, Ser: 1.4 mg/dL (ref 0.40–1.50)
GFR: 50.47 mL/min — ABNORMAL LOW (ref >60.00)
Glucose, Bld: 100 mg/dL — ABNORMAL HIGH (ref 70–99)
Potassium: 4.1 mEq/L (ref 3.5–5.1)
Sodium: 140 mEq/L (ref 135–145)
Total Bilirubin: 0.4 mg/dL (ref 0.2–1.2)
Total Protein: 6.8 g/dL (ref 6.0–8.3)

## 2021-03-10 ENCOUNTER — Other Ambulatory Visit: Payer: Self-pay

## 2021-03-10 ENCOUNTER — Ambulatory Visit (HOSPITAL_COMMUNITY)
Admission: RE | Admit: 2021-03-10 | Discharge: 2021-03-10 | Disposition: A | Discharge status: Home / Self Care | Payer: Medicare Other | Source: Ambulatory Visit | Attending: Family Medicine | Admitting: Family Medicine

## 2021-03-10 ENCOUNTER — Encounter (HOSPITAL_COMMUNITY): Payer: Self-pay

## 2021-03-10 DIAGNOSIS — Z9889 Other specified postprocedural states: Secondary | ICD-10-CM | POA: Diagnosis not present

## 2021-03-10 DIAGNOSIS — R490 Dysphonia: Secondary | ICD-10-CM | POA: Insufficient documentation

## 2021-03-10 DIAGNOSIS — R221 Localized swelling, mass and lump, neck: Secondary | ICD-10-CM | POA: Insufficient documentation

## 2021-03-10 DIAGNOSIS — K110 Atrophy of salivary gland: Secondary | ICD-10-CM | POA: Diagnosis not present

## 2021-03-10 DIAGNOSIS — R070 Pain in throat: Secondary | ICD-10-CM | POA: Insufficient documentation

## 2021-03-10 MED ORDER — SODIUM CHLORIDE (PF) 0.9 % IJ SOLN
INTRAMUSCULAR | Status: AC
  Filled 2021-03-10: qty 50

## 2021-03-10 MED ORDER — IOHEXOL 350 MG/ML SOLN
75.0000 mL | Freq: Once | INTRAVENOUS | Status: AC | PRN
  Administered 2021-03-10: 75 mL via INTRAVENOUS

## 2021-03-13 DIAGNOSIS — I2584 Coronary atherosclerosis due to calcified coronary lesion: Secondary | ICD-10-CM | POA: Diagnosis not present

## 2021-03-13 DIAGNOSIS — I10 Essential (primary) hypertension: Secondary | ICD-10-CM | POA: Diagnosis not present

## 2021-03-13 DIAGNOSIS — E7800 Pure hypercholesterolemia, unspecified: Secondary | ICD-10-CM | POA: Diagnosis not present

## 2021-03-13 DIAGNOSIS — I2510 Atherosclerotic heart disease of native coronary artery without angina pectoris: Secondary | ICD-10-CM | POA: Diagnosis not present

## 2021-03-13 DIAGNOSIS — I428 Other cardiomyopathies: Secondary | ICD-10-CM | POA: Diagnosis not present

## 2021-03-14 DIAGNOSIS — R070 Pain in throat: Secondary | ICD-10-CM | POA: Diagnosis not present

## 2021-03-14 DIAGNOSIS — K219 Gastro-esophageal reflux disease without esophagitis: Secondary | ICD-10-CM | POA: Diagnosis not present

## 2021-03-31 DIAGNOSIS — R9430 Abnormal result of cardiovascular function study, unspecified: Secondary | ICD-10-CM | POA: Diagnosis not present

## 2021-03-31 DIAGNOSIS — I428 Other cardiomyopathies: Secondary | ICD-10-CM | POA: Diagnosis not present

## 2021-04-26 DIAGNOSIS — R490 Dysphonia: Secondary | ICD-10-CM | POA: Diagnosis not present

## 2021-04-26 DIAGNOSIS — K219 Gastro-esophageal reflux disease without esophagitis: Secondary | ICD-10-CM | POA: Diagnosis not present

## 2021-04-26 DIAGNOSIS — R070 Pain in throat: Secondary | ICD-10-CM | POA: Diagnosis not present

## 2021-05-03 ENCOUNTER — Ambulatory Visit: Payer: Medicare Other | Admitting: Urology
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
## 2021-05-11 ENCOUNTER — Other Ambulatory Visit: Payer: Self-pay

## 2021-05-11 MED ORDER — TAMSULOSIN HCL 0.4 MG PO CAPS: ORAL_CAPSULE | ORAL | 3 refills | Status: DC

## 2021-05-17 ENCOUNTER — Ambulatory Visit (INDEPENDENT_AMBULATORY_CARE_PROVIDER_SITE_OTHER): Payer: Medicare Other | Admitting: Family Medicine

## 2021-05-17 ENCOUNTER — Encounter: Payer: Self-pay | Admitting: Family Medicine

## 2021-05-17 VITALS — BP 126/82 | HR 65 | Temp 98.0°F | Ht 70.0 in | Wt 230.0 lb

## 2021-05-17 DIAGNOSIS — E785 Hyperlipidemia, unspecified: Secondary | ICD-10-CM | POA: Diagnosis not present

## 2021-05-17 DIAGNOSIS — E118 Type 2 diabetes mellitus with unspecified complications: Secondary | ICD-10-CM | POA: Diagnosis not present

## 2021-05-17 DIAGNOSIS — E039 Hypothyroidism, unspecified: Secondary | ICD-10-CM

## 2021-05-17 DIAGNOSIS — D519 Vitamin B12 deficiency anemia, unspecified: Secondary | ICD-10-CM

## 2021-05-17 DIAGNOSIS — Z85828 Personal history of other malignant neoplasm of skin: Secondary | ICD-10-CM | POA: Diagnosis not present

## 2021-05-17 DIAGNOSIS — Z0000 Encounter for general adult medical examination without abnormal findings: Secondary | ICD-10-CM | POA: Diagnosis not present

## 2021-05-17 DIAGNOSIS — K50012 Crohn's disease of small intestine with intestinal obstruction: Secondary | ICD-10-CM

## 2021-05-17 DIAGNOSIS — R21 Rash and other nonspecific skin eruption: Secondary | ICD-10-CM

## 2021-05-17 DIAGNOSIS — I700 Atherosclerosis of aorta: Secondary | ICD-10-CM | POA: Diagnosis not present

## 2021-05-17 DIAGNOSIS — M791 Myalgia, unspecified site: Secondary | ICD-10-CM | POA: Diagnosis not present

## 2021-05-17 DIAGNOSIS — T466X5A Adverse effect of antihyperlipidemic and antiarteriosclerotic drugs, initial encounter: Secondary | ICD-10-CM

## 2021-05-17 DIAGNOSIS — I10 Essential (primary) hypertension: Secondary | ICD-10-CM

## 2021-05-17 DIAGNOSIS — I428 Other cardiomyopathies: Secondary | ICD-10-CM

## 2021-05-17 NOTE — Patient Instructions (Addendum)
Please stop by lab before you go ?If you have mychart- we will send your results within 3 business days of [place] receiving them.  ?If you do not have mychart- we will call you about results within 5 business days of [place] receiving them.  ?*please also note that you will see labs on mychart as soon as they post. I will later go in and write notes on them- will say "notes from Dr. Yong Channel"  ? ?We will call you within two weeks about your referral to dermatology. If you do not hear within 2 weeks, give [place] a call.   ? ?Recommended follow up: Return in about 6 months (around 11/17/2021) for follow up- or sooner if needed. ?

## 2021-05-18 LAB — CBC WITH DIFFERENTIAL/PLATELET
Basophils Absolute: 0 10*3/uL (ref 0.0–0.1)
Basophils Relative: 0.3 % (ref 0.0–3.0)
Eosinophils Absolute: 0.1 10*3/uL (ref 0.0–0.7)
Eosinophils Relative: 1.3 % (ref 0.0–5.0)
HCT: 38.6 % — ABNORMAL LOW (ref 39.0–52.0)
Hemoglobin: 12.7 g/dL — ABNORMAL LOW (ref 13.0–17.0)
Lymphocytes Relative: 30.9 % (ref 12.0–46.0)
Lymphs Abs: 2.3 10*3/uL (ref 0.7–4.0)
MCHC: 33 g/dL (ref 30.0–36.0)
MCV: 81 fl (ref 78.0–100.0)
Monocytes Absolute: 0.6 10*3/uL (ref 0.1–1.0)
Monocytes Relative: 8 % (ref 3.0–12.0)
Neutro Abs: 4.5 10*3/uL (ref 1.4–7.7)
Neutrophils Relative %: 59.5 % (ref 43.0–77.0)
Platelets: 451 10*3/uL — ABNORMAL HIGH (ref 150.0–400.0)
RBC: 4.77 Mil/uL (ref 4.22–5.81)
RDW: 17 % — ABNORMAL HIGH (ref 11.5–15.5)
WBC: 7.5 10*3/uL (ref 4.0–10.5)

## 2021-05-18 LAB — COMPREHENSIVE METABOLIC PANEL
ALT: 18 U/L (ref 0–53)
AST: 23 U/L (ref 0–37)
Albumin: 4 g/dL (ref 3.5–5.2)
Alkaline Phosphatase: 92 U/L (ref 39–117)
BUN: 15 mg/dL (ref 6–23)
CO2: 25 mEq/L (ref 19–32)
Calcium: 8.9 mg/dL (ref 8.4–10.5)
Chloride: 105 mEq/L (ref 96–112)
Creatinine, Ser: 1.41 mg/dL (ref 0.40–1.50)
GFR: 49.97 mL/min — ABNORMAL LOW (ref >60.00)
Glucose, Bld: 87 mg/dL (ref 70–99)
Potassium: 3.5 mEq/L (ref 3.5–5.1)
Sodium: 138 mEq/L (ref 135–145)
Total Bilirubin: 0.5 mg/dL (ref 0.2–1.2)
Total Protein: 7 g/dL (ref 6.0–8.3)

## 2021-05-18 LAB — SEDIMENTATION RATE: Sed Rate: 41 mm/hr — ABNORMAL HIGH (ref 0–20)

## 2021-05-18 LAB — TSH: TSH: 1.33 u[IU]/mL (ref 0.35–5.50)

## 2021-05-18 LAB — HEMOGLOBIN A1C: Hgb A1c MFr Bld: 6 % (ref 4.6–6.5)

## 2021-05-18 LAB — C-REACTIVE PROTEIN: CRP: <1 mg/dL (ref 0.5–20.0)

## 2021-06-06 ENCOUNTER — Ambulatory Visit: Payer: Medicare Other | Admitting: Urology

## 2021-06-14 ENCOUNTER — Other Ambulatory Visit: Payer: Self-pay | Admitting: Urology

## 2021-06-22 DIAGNOSIS — L300 Nummular dermatitis: Secondary | ICD-10-CM | POA: Diagnosis not present

## 2021-06-22 DIAGNOSIS — L821 Other seborrheic keratosis: Secondary | ICD-10-CM | POA: Diagnosis not present

## 2021-06-28 DIAGNOSIS — K219 Gastro-esophageal reflux disease without esophagitis: Secondary | ICD-10-CM | POA: Diagnosis not present

## 2021-06-28 DIAGNOSIS — R490 Dysphonia: Secondary | ICD-10-CM | POA: Diagnosis not present

## 2021-06-28 DIAGNOSIS — R070 Pain in throat: Secondary | ICD-10-CM | POA: Diagnosis not present

## 2021-07-04 ENCOUNTER — Encounter: Payer: Self-pay | Admitting: Family Medicine

## 2021-07-05 ENCOUNTER — Encounter: Payer: Self-pay | Admitting: Family

## 2021-07-05 ENCOUNTER — Ambulatory Visit (INDEPENDENT_AMBULATORY_CARE_PROVIDER_SITE_OTHER): Payer: Medicare Other | Admitting: Family

## 2021-07-05 VITALS — BP 155/94 | HR 73 | Temp 98.0°F | Ht 70.0 in | Wt 230.5 lb

## 2021-07-05 DIAGNOSIS — M545 Low back pain, unspecified: Secondary | ICD-10-CM

## 2021-07-05 MED ORDER — PREDNISONE 10 MG PO TABS: ORAL_TABLET | ORAL | 0 refills | Status: DC
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
## 2021-07-05 NOTE — Patient Instructions (Signed)
It was very nice to see you today! ? ?I have sent a prednisone pack to start tomorrow morning for your back pain. ?Can continue to take Tylenol as needed with the prednisone. ?Be sure to drink at least 2 liters of water daily. ?OK to continue to use heat up to 30 minutes 3 times a day. ? ?Let us know if your pain is not better. ? ? ?PLEASE NOTE: ? ?If you had any lab tests please let us know if you have not heard back within a few days. You may see your results on MyChart before we have a chance to review them but we will give you a call once they are reviewed by [place]. If we ordered any referrals today, please let us know if you have not heard from their office within the next week.  ? ?Please try these tips to maintain a healthy lifestyle: ? ?Eat most of your calories during the day when you are active. Eliminate processed foods including packaged sweets (pies, cakes, cookies), reduce intake of potatoes, white bread, white pasta, and white rice. Look for whole grain options, oat flour or almond flour. ? ?Each meal should contain half fruits/vegetables, one quarter protein, and one quarter carbs (no bigger than a computer mouse). ? ?Cut down on sweet beverages. This includes juice, soda, and sweet tea. Also watch fruit intake, though this is a healthier sweet option, it still contains natural sugar! Limit to 3 servings daily. ? ?Drink at least 1 glass of water with each meal and aim for at least 8 glasses per day ? ?Exercise at least 150 minutes every week.  ? ?

## 2021-07-11 ENCOUNTER — Ambulatory Visit: Payer: Medicare Other | Admitting: Urology

## 2021-07-14 ENCOUNTER — Encounter: Payer: Self-pay | Admitting: Family

## 2021-07-14 DIAGNOSIS — M5134 Other intervertebral disc degeneration, thoracic region: Secondary | ICD-10-CM

## 2021-07-21 ENCOUNTER — Telehealth: Payer: Self-pay | Admitting: Family Medicine

## 2021-07-21 NOTE — Telephone Encounter (Signed)
Copied from [place] [id number]. Topic: Medicare AWV >> Jul 21, 2021 10:55 AM Harris-Coley, Hannah Beat wrote: Reason for CRM: Attempted to schedule AWV. Unable to LVM.  Will try at later time.

## 2021-07-25 ENCOUNTER — Ambulatory Visit: Payer: Medicare Other | Admitting: Urology

## 2021-07-28 DIAGNOSIS — H40033 Anatomical narrow angle, bilateral: Secondary | ICD-10-CM | POA: Diagnosis not present

## 2021-07-28 DIAGNOSIS — E119 Type 2 diabetes mellitus without complications: Secondary | ICD-10-CM | POA: Diagnosis not present

## 2021-09-15 DIAGNOSIS — M1711 Unilateral primary osteoarthritis, right knee: Secondary | ICD-10-CM | POA: Diagnosis not present

## 2021-09-15 DIAGNOSIS — I10 Essential (primary) hypertension: Secondary | ICD-10-CM | POA: Diagnosis not present

## 2021-09-15 DIAGNOSIS — R52 Pain, unspecified: Secondary | ICD-10-CM | POA: Diagnosis not present

## 2021-10-10 NOTE — Progress Notes (Unsigned)
10/11/2021 [name] [id number] Jul 16, 1950  Referring provider: Marin Olp, MD Primary GI doctor: Dr. Hilarie Fredrickson  ASSESSMENT AND PLAN:   Assessment: 71 y.o. male here for assessment of the following: 1. Crohn's disease of ileum with intestinal obstruction ([place])   2. Gastroesophageal reflux disease without esophagitis   3. Lacunar infarction   4. Nonischemic cardiomyopathy   5. Iron deficiency anemia, unspecified iron deficiency anemia type   6. Anemia due to vitamin B12 deficiency, unspecified B12 deficiency type   7. Type II diabetes mellitus with manifestations   8. Right lower quadrant abdominal pain   9. Screening for viral disease    Last colonoscopy: 03/2017 mild to moderate ileitis with erosions, erythema and ulcerations in the neoterminal ileum, severely active chronic active ileitis consistent with Crohn's. Due for repeat 03/2022 Last small bowel imaging: 08/2013 with abdominal pain and history of small bowel obstruction with Crohn's showed small bowel obstruction level terminal ileum, small bilateral indeterminate renal lesions, status post ileus ectomy  Patient lost to follow-up after death of his wife, last seen 2017/05/10. Has been having worsening reflux, sore throat.  Has seen Dr. Melene Plan who believes it is more reflux, had CT neck without any evidence of carcinoma.  Mild intermittent dysphagia.  Patient is also been having some worsening loose stools, abdominal pain, abdominal distention with decreased flatulence some concern for obstruction with history, last colonoscopy 2017-05-10 had good anastomosis but several ulcers. Right lower quadrant abdominal pain on exam.  Last echocardiogram 03/2021 ejection fraction 5055%, normal valves, LVH, no shortness of breath or chest pain. Patient's not on any blood thinners, will plan to schedule EGD at [place]: Will start the patient on a PPI twice daily 40 mg, will schedule for EGD Lifestyle changes discussed, avoid  NSAIDS, ETOH-had recent prednisone for a fall for his back. Will schedule EGD to evaluate for possible H. pylori, esophagitis, gastritis, Barrett's, peptic ulcer disease, etc.. I discussed risks of EGD with patient today, including risk of sedation, bleeding or perforation.  Patient provides understanding and gave verbal consent to proceed.  Recommend updated CT enterography  to assess for inflammation/strictures and small bowel.  Check inflammatory labs, Fecal calprotectin, CBC, CMET, CRP.  Patient is due for colonoscopy 03/2022, pending CT enterography results may try to schedule. Will go ahead and restart patient on Pentasa 1 g 3 times daily Patient had good response to budesonide previously, pending labs and CT will plan to restart the patient on this. Will check HBV surface ag,  and Quantiferon Gold case there is a need to advance therapy  Orders Placed This Encounter  Procedures   CALPROTECTIN   CT ENTERO ABD/PELVIS W CONTAST   CBC with Differential/Platelet   Basic metabolic panel   Hepatic function panel   High sensitivity CRP   Sedimentation rate   Hepatitis B surface antigen   QuantiFERON-TB Gold Plus   Ambulatory referral to Gastroenterology    Meds ordered this encounter  Medications   pantoprazole (PROTONIX) 40 MG tablet    Sig: Take 1 tablet (40 mg total) by mouth 2 (two) times daily before a meal.    Dispense:  60 tablet    Refill:  3   mesalamine (PENTASA) 500 MG CR capsule    Sig: Take 1 capsule (500 mg total) by mouth 3 (three) times daily.    Dispense:  90 capsule    Refill:  3      History of Present Illness:  71  y.o. male presents for evaluation of Crohn's ileitis. Past medical history includes B12 deficiency, BPH, diverticulosis, history of DVT after [employer] vaccine, COPD emphysema, hypertension, gallstones, hyperlipidemia, hypothyroidism, nonischemic cardiomyopathy ejection fraction 45 to 50% 2020, sleep apnea not on CPAP, type 2 diabetes,  GERD, Crohn's ileitis and IDA Last seen in the office on 10/03/2017 by Dr. Hilarie Fredrickson.  IBD history:Diagnosed 2009  Surgical history: Status post ileocecectomy June 2015 for ileal stricture..  Current medications and last dose:  Prior medications: (antibody titers, reasons stopped) -03/2017 after colonoscopy 8-week course of budesonide 9 mg daily and restarted Pentasa 1 g twice daily. -Previously on Pentasa for his ileocecectomy in 2015.   -Had considerable improvement reported at last office visit 10/2017 Last colonoscopy: 03/2017 mild to moderate ileitis with erosions, erythema and ulcerations in the neoterminal ileum, severely active chronic active ileitis consistent with Crohn's. Due for repeat 03/2022 Last small bowel imaging: 08/2013 with abdominal pain and history of small bowel obstruction with Crohn's showed small bowel obstruction level terminal ileum, small bilateral indeterminate renal lesions, status post ileus ectomy  Recent labs: 05/17/2021 CRP <1.0 05/17/2021 SED RATE 41 05/17/2021 WBC 7.5 HGB 12.7 MCV 81.0 Platelets 451.0 10/28/2019 Iron 81 Ferritin 50 B12 >1550 05/17/2021 AST 23 ALT 18 Alkphos 92 TBili 0.5 10/28/2019 VITAMIN D 34  Current History Daughter comes with the patient, HOH, hears out of left ear, Adria and she provides some history.  States his wife died during the last several years.  Saw PCP with sore throat and trouble swallowing, had swelling at neck so had CT scan that was unremarkable.  Referred to ENT, Dr. Lutricia Feil, saw erythema so was started on pepcid x 3 months without change, then started prilosec in Feb, still not resolving so made appointment.  He states he continues to have upper esophageal pain, worse with coffee and spicy foods will start to have discomfort and worse hoarseness.  Will have intermittent pills and food getting caught in his stomach.  No NSAIDS, no ETOH. Former smoker, quit smoking 2007, 160 pack year smoking history.  Denies nausea, vomiting,  black stools.   He has BM 3-4 times during the day, always loose.  No blood or mucus in the stool.  He has intermittent AB pain left lower AB, will increase miralax and feel better after BM. AFter he eats will have AB swelling and discomfort, not passing as much gas.  No fever, chills, weight loss.   IBD Health Care Maintenance: Annual Flu Vaccine -gets annually, due this year. Pneumococcal Vaccine if receiving immunosuppression: -Completed TB testing if on anti-TNF, yearly -will get Vitamin D screening -last checked 2021 COVID vaccine -Moderna vaccines completed Shingrix -will suggest the patient get  Immunization History  Administered Date(s) Administered   Fluad Quad(high Dose 65+) 11/01/2018, 12/16/2020   Influenza, High Dose Seasonal PF 12/02/2015, 11/02/2016, 12/12/2017, 12/16/2019   Influenza,inj,Quad PF,6+ Mos 01/06/2014, 12/18/2018   Janssen (J&J) SARS-COV-2 Vaccination 06/10/2019   Moderna Covid-19 Vaccine Bivalent Booster 37yr & up 03/22/2021   Moderna Sars-Covid-2 Vaccination 02/29/2020, 09/19/2020   Pneumococcal Conjugate-13 01/21/2014   Pneumococcal Polysaccharide-23 04/03/2012, 06/27/2017   Pneumococcal-Unspecified 04/03/2012, 06/27/2017   Tdap 04/03/2012, 03/13/2020      Current Medications:   Current Outpatient Medications (Endocrine & Metabolic):    levothyroxine (SYNTHROID) 200 MCG tablet, TAKE 1 TABLET (200 MCG TOTAL) BY MOUTH DAILY BEFORE BREAKFAST.   predniSONE (DELTASONE) 10 MG tablet, 5 tab day 1, 4 tab day 2-3, 3 tab day 4-5, 2 tab day 6-7 (  Patient not taking: Reported on 10/11/2021)  Current Outpatient Medications (Cardiovascular):    bosentan (TRACLEER) 125 MG tablet, Take by mouth 2 (two) times daily.   carvedilol (COREG) 6.25 MG tablet, Take 6.25 mg by mouth 2 (two) times daily.   Current Outpatient Medications (Analgesics):    aspirin 81 MG chewable tablet, Chew by mouth daily.  Current Outpatient Medications (Hematological):    vitamin B-12  (CYANOCOBALAMIN) 1000 MCG tablet, Take 1,000 mcg by mouth daily.  Current Outpatient Medications (Other):    famotidine (PEPCID) 20 MG tablet, Take 20 mg by mouth at bedtime.   Garlic 1517 MG CAPS, Take by mouth.   magnesium 30 MG tablet, Take 30 mg by mouth 2 (two) times daily.   MODERNA COVID-19 VACCINE 100 MCG/0.5ML injection,    Multiple Vitamins-Minerals (ONE-A-DAY MENS HEALTH FORMULA) TABS, Take by mouth.   omeprazole (PRILOSEC) 20 MG capsule, Take 20 mg by mouth every morning.   polyethylene glycol (MIRALAX / GLYCOLAX) 17 g packet, Take 17 g by mouth daily.   tamsulosin (FLOMAX) 0.4 MG CAPS capsule, TAKE 1 CAPSULE BY MOUTH 2 TIMES DAILY   potassium gluconate 595 (99 K) MG TABS tablet, Take 595 mg by mouth. (Patient not taking: Reported on 10/11/2021)  Surgical History:  He  has a past surgical history that includes Nasal sinus surgery; Cholecystectomy; spleenectomy; seventh nerve face; Colonoscopy (01/19/2011); laparotomy (N/A, 08/11/2013); Bowel resection (N/A, 08/11/2013); Incision and drainage perirectal abscess (N/A, 09/08/2013); and Tonsillectomy and adenoidectomy. Family History:  His family history includes Alzheimer's disease in his maternal grandfather, maternal grandmother, mother, paternal grandfather, and paternal grandmother; Arthritis in his daughter; Bladder Cancer in his paternal grandfather; Brain cancer in his father; Colon cancer in his mother and sister; Crohn's disease in his daughter; Healthy in his sister, sister, and son; Heart disease in his maternal grandmother; Irritable bowel syndrome in his sister; Other in his daughter; Uterine cancer in his mother. Social History:   reports that he quit smoking about 15 years ago. His smoking use included cigarettes. He has a 160.00 pack-year smoking history. He has never used smokeless tobacco. He reports that he does not drink alcohol and does not use drugs.  Current Medications, Allergies, Past Medical History, Past Surgical  History, Family History and Social History were reviewed in [employer] record.  Physical Exam: BP (!) 142/86   Pulse 75   Ht 5' 10"  (1.778 m)   Wt 231 lb (104.8 kg)   BMI 33.15 kg/m  General:   Pleasant, elderly appearing hard of hearing male Heart : Systolic murmur, regular rate and rhythm Pulm: Clear anteriorly; no wheezing Abdomen:  Soft, Obese AB, Sluggish bowel sounds. moderate tenderness in the epigastrium and in the RLQ. With guarding and Without rebound, No organomegaly appreciated. Rectal: Not evaluated Extremities:  without  edema. Neurologic:  Alert and  oriented x4;  Psych:  Cooperative.  Flat affect, normal mood.   Vladimir Crofts, PA-C 10/11/21

## 2021-10-11 ENCOUNTER — Encounter: Payer: Self-pay | Admitting: Physician Assistant

## 2021-10-11 ENCOUNTER — Other Ambulatory Visit (INDEPENDENT_AMBULATORY_CARE_PROVIDER_SITE_OTHER): Payer: Medicare Other

## 2021-10-11 ENCOUNTER — Ambulatory Visit: Payer: Medicare Other | Admitting: Physician Assistant

## 2021-10-11 VITALS — BP 142/86 | HR 75 | Ht 70.0 in | Wt 231.0 lb

## 2021-10-11 DIAGNOSIS — M5136 Other intervertebral disc degeneration, lumbar region: Secondary | ICD-10-CM | POA: Diagnosis not present

## 2021-10-11 DIAGNOSIS — D519 Vitamin B12 deficiency anemia, unspecified: Secondary | ICD-10-CM | POA: Diagnosis not present

## 2021-10-11 DIAGNOSIS — K50012 Crohn's disease of small intestine with intestinal obstruction: Secondary | ICD-10-CM

## 2021-10-11 DIAGNOSIS — R1031 Right lower quadrant pain: Secondary | ICD-10-CM

## 2021-10-11 DIAGNOSIS — D509 Iron deficiency anemia, unspecified: Secondary | ICD-10-CM | POA: Diagnosis not present

## 2021-10-11 DIAGNOSIS — Z1159 Encounter for screening for other viral diseases: Secondary | ICD-10-CM

## 2021-10-11 DIAGNOSIS — E118 Type 2 diabetes mellitus with unspecified complications: Secondary | ICD-10-CM | POA: Diagnosis not present

## 2021-10-11 DIAGNOSIS — M47816 Spondylosis without myelopathy or radiculopathy, lumbar region: Secondary | ICD-10-CM | POA: Diagnosis not present

## 2021-10-11 DIAGNOSIS — K219 Gastro-esophageal reflux disease without esophagitis: Secondary | ICD-10-CM

## 2021-10-11 DIAGNOSIS — I428 Other cardiomyopathies: Secondary | ICD-10-CM

## 2021-10-11 DIAGNOSIS — I6381 Other cerebral infarction due to occlusion or stenosis of small artery: Secondary | ICD-10-CM

## 2021-10-11 DIAGNOSIS — I10 Essential (primary) hypertension: Secondary | ICD-10-CM | POA: Diagnosis not present

## 2021-10-11 LAB — CBC WITH DIFFERENTIAL/PLATELET
Basophils Absolute: 0.1 10*3/uL (ref 0.0–0.1)
Basophils Relative: 1.5 % (ref 0.0–3.0)
Eosinophils Absolute: 0.2 10*3/uL (ref 0.0–0.7)
Eosinophils Relative: 3.4 % (ref 0.0–5.0)
HCT: 40.7 % (ref 39.0–52.0)
Hemoglobin: 13.2 g/dL (ref 13.0–17.0)
Lymphocytes Relative: 25.4 % (ref 12.0–46.0)
Lymphs Abs: 1.7 10*3/uL (ref 0.7–4.0)
MCHC: 32.5 g/dL (ref 30.0–36.0)
MCV: 84.9 fl (ref 78.0–100.0)
Monocytes Absolute: 0.8 10*3/uL (ref 0.1–1.0)
Monocytes Relative: 11.6 % (ref 3.0–12.0)
Neutro Abs: 3.8 10*3/uL (ref 1.4–7.7)
Neutrophils Relative %: 58.1 % (ref 43.0–77.0)
Platelets: 426 10*3/uL — ABNORMAL HIGH (ref 150.0–400.0)
RBC: 4.79 Mil/uL (ref 4.22–5.81)
RDW: 16.3 % — ABNORMAL HIGH (ref 11.5–15.5)
WBC: 6.5 10*3/uL (ref 4.0–10.5)

## 2021-10-11 LAB — BASIC METABOLIC PANEL
BUN: 14 mg/dL (ref 6–23)
CO2: 29 mEq/L (ref 19–32)
Calcium: 9.2 mg/dL (ref 8.4–10.5)
Chloride: 103 mEq/L (ref 96–112)
Creatinine, Ser: 1.31 mg/dL (ref 0.40–1.50)
GFR: 54.43 mL/min — ABNORMAL LOW (ref >60.00)
Glucose, Bld: 90 mg/dL (ref 70–99)
Potassium: 4 mEq/L (ref 3.5–5.1)
Sodium: 138 mEq/L (ref 135–145)

## 2021-10-11 LAB — HEPATIC FUNCTION PANEL
ALT: 17 U/L (ref 0–53)
AST: 24 U/L (ref 0–37)
Albumin: 4 g/dL (ref 3.5–5.2)
Alkaline Phosphatase: 85 U/L (ref 39–117)
Bilirubin, Direct: 0.1 mg/dL (ref 0.0–0.3)
Total Bilirubin: 0.6 mg/dL (ref 0.2–1.2)
Total Protein: 7.3 g/dL (ref 6.0–8.3)

## 2021-10-11 LAB — HIGH SENSITIVITY CRP: CRP, High Sensitivity: 2.77 mg/L (ref 0.000–5.000)

## 2021-10-11 LAB — SEDIMENTATION RATE: Sed Rate: 32 mm/hr — ABNORMAL HIGH (ref 0–20)

## 2021-10-11 MED ORDER — MESALAMINE ER 500 MG PO CPCR: 500.0000 mg | ORAL_CAPSULE | Freq: Three times a day (TID) | ORAL | 3 refills | Status: DC

## 2021-10-11 MED ORDER — PANTOPRAZOLE SODIUM 40 MG PO TBEC: 40.0000 mg | DELAYED_RELEASE_TABLET | Freq: Two times a day (BID) | ORAL | 3 refills | Status: DC

## 2021-10-11 NOTE — Patient Instructions (Addendum)
Your provider has requested that you go to the basement level for lab work before leaving today. Press "B" on the elevator. The lab is located at the first door on the left as you exit the elevator.  You have been scheduled for an endoscopy. Please follow written instructions given to you at your visit today. If you use inhalers (even only as needed), please bring them with you on the day of your procedure.    You will be contacted by [facility] in the next 7  days to arrange a CT Enterography.  The number on your caller ID will be [phone], please answer when they call.  If you have not heard from them in 2 days please call [phone] to schedule.     Due to recent changes in healthcare laws, you may see the results of your imaging and laboratory studies on MyChart before your provider has had a chance to review them.  We understand that in some cases there may be results that are confusing or concerning to you. Not all laboratory results come back in the same time frame and the provider may be waiting for multiple results in order to interpret others.  Please give [place] 48 hours in order for your provider to thoroughly review all the results before contacting the office for clarification of your results.    _______________________________________________________  If you are age 5 or older, your body mass index should be between 23-30. Your Body mass index is 33.15 kg/m. If this is out of the aforementioned range listed, please consider follow up with your Primary Care Provider.  If you are age 29 or younger, your body mass index should be between 19-25. Your Body mass index is 33.15 kg/m. If this is out of the aformentioned range listed, please consider follow up with your Primary Care Provider.   ________________________________________________________  The [facility] providers would like to encourage you to use [facility] to communicate with providers for non-urgent  requests or questions.  Due to long hold times on the telephone, sending your provider a message by [facility] may be a faster and more efficient way to get a response.  Please allow 48 business hours for a response.  Please remember that this is for non-urgent requests.  _______________________________________________________   Thank you for choosing [facility]  [facility]

## 2021-10-12 ENCOUNTER — Other Ambulatory Visit: Payer: Self-pay

## 2021-10-12 ENCOUNTER — Encounter: Payer: Self-pay | Admitting: Certified Registered Nurse Anesthetist

## 2021-10-12 NOTE — Progress Notes (Signed)
Addendum: Reviewed and agree with assessment and management plan. Bari Handshoe M, MD  

## 2021-10-14 LAB — HEPATITIS B SURFACE ANTIGEN: Hepatitis B Surface Ag: NONREACTIVE

## 2021-10-14 LAB — QUANTIFERON-TB GOLD PLUS
Mitogen-NIL: 5.94 IU/mL
NIL: 0.04 IU/mL
QuantiFERON-TB Gold Plus: NEGATIVE
TB1-NIL: <0 IU/mL
TB2-NIL: 0 IU/mL

## 2021-10-16 ENCOUNTER — Encounter (HOSPITAL_COMMUNITY): Payer: Self-pay

## 2021-10-16 ENCOUNTER — Other Ambulatory Visit: Payer: Medicare Other

## 2021-10-16 ENCOUNTER — Ambulatory Visit (HOSPITAL_COMMUNITY)
Admission: RE | Admit: 2021-10-16 | Discharge: 2021-10-16 | Disposition: A | Discharge status: Home / Self Care | Payer: Medicare Other | Source: Ambulatory Visit | Attending: Physician Assistant | Admitting: Physician Assistant

## 2021-10-16 DIAGNOSIS — R1031 Right lower quadrant pain: Secondary | ICD-10-CM | POA: Diagnosis not present

## 2021-10-16 DIAGNOSIS — K50012 Crohn's disease of small intestine with intestinal obstruction: Secondary | ICD-10-CM

## 2021-10-16 MED ORDER — BARIUM SULFATE 0.1 % PO SUSP
ORAL | Status: AC
  Filled 2021-10-16: qty 3

## 2021-10-19 ENCOUNTER — Ambulatory Visit (AMBULATORY_SURGERY_CENTER): Payer: Medicare Other | Admitting: Internal Medicine

## 2021-10-19 ENCOUNTER — Encounter: Payer: Self-pay | Admitting: Internal Medicine

## 2021-10-19 VITALS — BP 137/81 | HR 70 | Temp 96.6°F | Resp 10 | Ht 70.0 in | Wt 231.0 lb

## 2021-10-19 DIAGNOSIS — K219 Gastro-esophageal reflux disease without esophagitis: Secondary | ICD-10-CM | POA: Diagnosis not present

## 2021-10-19 DIAGNOSIS — K317 Polyp of stomach and duodenum: Secondary | ICD-10-CM | POA: Diagnosis not present

## 2021-10-19 DIAGNOSIS — K2940 Chronic atrophic gastritis without bleeding: Secondary | ICD-10-CM | POA: Diagnosis not present

## 2021-10-19 DIAGNOSIS — K2960 Other gastritis without bleeding: Secondary | ICD-10-CM

## 2021-10-19 DIAGNOSIS — K31A0 Gastric intestinal metaplasia, unspecified: Secondary | ICD-10-CM | POA: Diagnosis not present

## 2021-10-19 DIAGNOSIS — K2970 Gastritis, unspecified, without bleeding: Secondary | ICD-10-CM

## 2021-10-19 MED ORDER — SODIUM CHLORIDE 0.9 % IV SOLN: 500.0000 mL | Freq: Once | INTRAVENOUS | Status: DC

## 2021-10-19 NOTE — Op Note (Signed)
[facility] Patient Name: Archer Moist Procedure Date: 10/19/2021 9:02 AM MRN: [id number] Endoscopist: Jerene Bears , MD Age: 71 Referring MD:  Date of Birth: 1950-07-16 Gender: Male Account #: [id number] Procedure:                Upper GI endoscopy Indications:              Heartburn, Gastro-esophageal reflux disease, also                            history of small bowel Crohn's disease Medicines:                Monitored Anesthesia Care Procedure:                Pre-Anesthesia Assessment:                           - Prior to the procedure, a History and Physical                            was performed, and patient medications and                            allergies were reviewed. The patient's tolerance of                            previous anesthesia was also reviewed. The risks                            and benefits of the procedure and the sedation                            options and risks were discussed with the patient.                            All questions were answered, and informed consent                            was obtained. Prior Anticoagulants: The patient has                            taken no previous anticoagulant or antiplatelet                            agents. ASA Grade Assessment: III - A patient with                            severe systemic disease. After reviewing the risks                            and benefits, the patient was deemed in                            satisfactory condition to undergo the procedure.  After obtaining informed consent, the endoscope was                            passed under direct vision. Throughout the                            procedure, the patient's blood pressure, pulse, and                            oxygen saturations were monitored continuously. The                            Endoscope was introduced through the mouth, and                            advanced to the  second part of duodenum. The upper                            GI endoscopy was accomplished without difficulty.                            The patient tolerated the procedure well. Scope In: Scope Out: Findings:                 The examined esophagus was normal.                           A single 6 mm sessile polyp was found in the                            gastric antrum. The polyp was removed with a cold                            biopsy forceps. Resection and retrieval were                            complete.                           Diffuse mild inflammation characterized by erythema                            and granularity was found in the gastric body and                            in the gastric antrum. Biopsies were taken with a                            cold forceps for histology and Helicobacter pylori                            testing.                           The examined duodenum was normal.  Complications:            No immediate complications. Estimated Blood Loss:     Estimated blood loss was minimal. Impression:               - Normal esophagus.                           - A single gastric polyp. Resected and retrieved.                           - Mild gastritis with atrophic appearing mucosa                            most notable in the gastric body. Biopsied.                           - Normal examined duodenum. Recommendation:           - Patient has a contact number available for                            emergencies. The signs and symptoms of potential                            delayed complications were discussed with the                            patient. Return to normal activities tomorrow.                            Written discharge instructions were provided to the                            patient.                           - Resume previous diet.                           - Continue present medications.                           - Await  pathology results.                           - Proceed with CT enterography as scheduled.                           - Office followup with me in 3 months or so. Jerene Bears, MD 10/19/2021 9:32:12 AM This report has been signed electronically.

## 2021-10-19 NOTE — Progress Notes (Signed)
0911 Robinul 0.1 mg IV given due large amount of secretions upon assessment.  MD made aware, vss

## 2021-10-19 NOTE — Progress Notes (Signed)
0919  Pt experienced osa with jaw thrust performed. vss

## 2021-10-19 NOTE — Patient Instructions (Signed)
Handout given on gastritis  YOU HAD AN ENDOSCOPIC PROCEDURE TODAY AT [facility]:   Refer to the procedure report that was given to you for any specific questions about what was found during the examination.  If the procedure report does not answer your questions, please call your gastroenterologist to clarify.  If you requested that your care partner not be given the details of your procedure findings, then the procedure report has been included in a sealed envelope for you to review at your convenience later.  YOU SHOULD EXPECT: Some feelings of bloating in the abdomen. Passage of more gas than usual.  Walking can help get rid of the air that was put into your GI tract during the procedure and reduce the bloating. If you had a lower endoscopy (such as a colonoscopy or flexible sigmoidoscopy) you may notice spotting of blood in your stool or on the toilet paper. If you underwent a bowel prep for your procedure, you may not have a normal bowel movement for a few days.  Please Note:  You might notice some irritation and congestion in your nose or some drainage.  This is from the oxygen used during your procedure.  There is no need for concern and it should clear up in a day or so.  SYMPTOMS TO REPORT IMMEDIATELY:  Following upper endoscopy (EGD)  Vomiting of blood or coffee ground material  New chest pain or pain under the shoulder blades  Painful or persistently difficult swallowing  New shortness of breath  Fever of 100F or higher  Black, tarry-looking stools  For urgent or emergent issues, a gastroenterologist can be reached at any hour by calling [phone]. Do not use MyChart messaging for urgent concerns.    DIET:  We do recommend a small meal at first, but then you may proceed to your regular diet.  Drink plenty of fluids but you should avoid alcoholic beverages for 24 hours.  ACTIVITY:  You should plan to take it easy for the rest of today and you should NOT DRIVE  or use heavy machinery until tomorrow (because of the sedation medicines used during the test).    FOLLOW UP: Our staff will call the number listed on your records the next business day following your procedure.  We will call around 7:15- 8:00 am to check on you and address any questions or concerns that you may have regarding the information given to you following your procedure. If we do not reach you, we will leave a message.  If you develop any symptoms (ie: fever, flu-like symptoms, shortness of breath, cough etc.) before then, please call [phone].  If you test positive for Covid 19 in the 2 weeks post procedure, please call and report this information to [place].    If any biopsies were taken you will be contacted by phone or by letter within the next 1-3 weeks.  Please call us at [phone] if you have not heard about the biopsies in 3 weeks.    SIGNATURES/CONFIDENTIALITY: You and/or your care partner have signed paperwork which will be entered into your electronic medical record.  These signatures attest to the fact that that the information above on your After Visit Summary has been reviewed and is understood.  Full responsibility of the confidentiality of this discharge information lies with you and/or your care-partner.

## 2021-10-19 NOTE — Progress Notes (Signed)
Pt's states no medical or surgical changes since previsit or office visit. 

## 2021-10-19 NOTE — Progress Notes (Signed)
Report given to PACU, vss 

## 2021-10-19 NOTE — Progress Notes (Signed)
See office note dated 10/11/2021 for details and current H&P Patient seen for EGD to evaluate GERD   He remains appropriate for EGD here today.

## 2021-10-20 ENCOUNTER — Telehealth: Payer: Self-pay | Admitting: *Deleted

## 2021-10-20 NOTE — Telephone Encounter (Signed)
  Follow up Call-     10/19/2021    8:05 AM  Call back number  Post procedure Call Back phone  # Roy-son call [phone]  Permission to leave phone message Yes     Patient questions:  Do you have a fever, pain , or abdominal swelling? No. Pain Score  0 *  Have you tolerated food without any problems? Yes.    Have you been able to return to your normal activities? Yes.    Do you have any questions about your discharge instructions: Diet   No. Medications  No. Follow up visit  No.  Do you have questions or concerns about your Care? No.  Actions: * If pain score is 4 or above: No action needed, pain <4.

## 2021-10-21 LAB — CALPROTECTIN: Calprotectin: 639 mcg/g — ABNORMAL HIGH

## 2021-10-24 ENCOUNTER — Encounter: Payer: Self-pay | Admitting: Internal Medicine

## 2021-10-26 ENCOUNTER — Ambulatory Visit (HOSPITAL_COMMUNITY)
Admission: RE | Admit: 2021-10-26 | Discharge: 2021-10-26 | Disposition: A | Discharge status: Home / Self Care | Payer: Medicare Other | Source: Ambulatory Visit | Attending: Physician Assistant | Admitting: Physician Assistant

## 2021-10-26 ENCOUNTER — Encounter (HOSPITAL_COMMUNITY): Payer: Self-pay

## 2021-10-26 DIAGNOSIS — K50012 Crohn's disease of small intestine with intestinal obstruction: Secondary | ICD-10-CM | POA: Insufficient documentation

## 2021-10-26 DIAGNOSIS — R1031 Right lower quadrant pain: Secondary | ICD-10-CM | POA: Diagnosis not present

## 2021-10-26 DIAGNOSIS — I700 Atherosclerosis of aorta: Secondary | ICD-10-CM | POA: Diagnosis not present

## 2021-10-26 DIAGNOSIS — R109 Unspecified abdominal pain: Secondary | ICD-10-CM | POA: Diagnosis not present

## 2021-10-26 MED ORDER — IOHEXOL 300 MG/ML  SOLN
100.0000 mL | Freq: Once | INTRAMUSCULAR | Status: AC | PRN
  Administered 2021-10-26: 100 mL via INTRAVENOUS

## 2021-10-26 MED ORDER — SODIUM CHLORIDE (PF) 0.9 % IJ SOLN
INTRAMUSCULAR | Status: AC
  Filled 2021-10-26: qty 50

## 2021-11-08 ENCOUNTER — Other Ambulatory Visit: Payer: Self-pay | Admitting: Physician Assistant

## 2021-11-15 ENCOUNTER — Ambulatory Visit (HOSPITAL_BASED_OUTPATIENT_CLINIC_OR_DEPARTMENT_OTHER): Payer: Medicare Other | Attending: Nurse Practitioner | Admitting: Physical Therapy

## 2021-11-15 ENCOUNTER — Other Ambulatory Visit: Payer: Self-pay

## 2021-11-15 DIAGNOSIS — M47816 Spondylosis without myelopathy or radiculopathy, lumbar region: Secondary | ICD-10-CM | POA: Insufficient documentation

## 2021-11-15 DIAGNOSIS — M5134 Other intervertebral disc degeneration, thoracic region: Secondary | ICD-10-CM | POA: Diagnosis not present

## 2021-11-15 DIAGNOSIS — M1711 Unilateral primary osteoarthritis, right knee: Secondary | ICD-10-CM | POA: Diagnosis not present

## 2021-11-15 DIAGNOSIS — M546 Pain in thoracic spine: Secondary | ICD-10-CM | POA: Insufficient documentation

## 2021-11-15 DIAGNOSIS — M545 Low back pain, unspecified: Secondary | ICD-10-CM | POA: Insufficient documentation

## 2021-11-15 DIAGNOSIS — M5459 Other low back pain: Secondary | ICD-10-CM

## 2021-11-15 DIAGNOSIS — G8929 Other chronic pain: Secondary | ICD-10-CM | POA: Insufficient documentation
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
## 2021-11-16 ENCOUNTER — Encounter (HOSPITAL_BASED_OUTPATIENT_CLINIC_OR_DEPARTMENT_OTHER): Payer: Self-pay | Admitting: Physical Therapy

## 2021-11-17 ENCOUNTER — Telehealth: Payer: Self-pay | Admitting: Internal Medicine

## 2021-11-17 NOTE — Telephone Encounter (Signed)
Inbound call from patients daughter 3xs trying to schedule follow up visit in November. There are no available appts with Dr. Hilarie Fredrickson in November and they really only want to see him not APP. He needs a driver so he is depending on his son and daughters schedule. Please advise, thank you.

## 2021-11-17 NOTE — Telephone Encounter (Signed)
Pt scheduled to see Dr. Norman Herrlich 01/03/22 at 1:50pm. Please notify pt of appt date and time.

## 2021-11-17 NOTE — Telephone Encounter (Signed)
Called pt's daughter and made aware of appt.

## 2021-11-22 ENCOUNTER — Ambulatory Visit (HOSPITAL_BASED_OUTPATIENT_CLINIC_OR_DEPARTMENT_OTHER): Payer: Medicare Other | Admitting: Physical Therapy

## 2021-11-22 ENCOUNTER — Encounter (HOSPITAL_BASED_OUTPATIENT_CLINIC_OR_DEPARTMENT_OTHER): Payer: Self-pay | Admitting: Physical Therapy

## 2021-11-22 ENCOUNTER — Ambulatory Visit: Payer: Medicare Other | Admitting: Family Medicine

## 2021-11-22 DIAGNOSIS — M47816 Spondylosis without myelopathy or radiculopathy, lumbar region: Secondary | ICD-10-CM | POA: Diagnosis not present

## 2021-11-22 DIAGNOSIS — M1711 Unilateral primary osteoarthritis, right knee: Secondary | ICD-10-CM | POA: Diagnosis not present

## 2021-11-22 DIAGNOSIS — M546 Pain in thoracic spine: Secondary | ICD-10-CM

## 2021-11-22 DIAGNOSIS — M5459 Other low back pain: Secondary | ICD-10-CM

## 2021-11-22 DIAGNOSIS — G8929 Other chronic pain: Secondary | ICD-10-CM | POA: Diagnosis not present

## 2021-11-22 DIAGNOSIS — M545 Low back pain, unspecified: Secondary | ICD-10-CM | POA: Diagnosis not present

## 2021-11-22 DIAGNOSIS — M5134 Other intervertebral disc degeneration, thoracic region: Secondary | ICD-10-CM | POA: Diagnosis not present
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
## 2021-11-23 ENCOUNTER — Encounter: Payer: Self-pay | Admitting: Family Medicine

## 2021-11-23 ENCOUNTER — Other Ambulatory Visit (INDEPENDENT_AMBULATORY_CARE_PROVIDER_SITE_OTHER): Payer: Medicare Other

## 2021-11-23 ENCOUNTER — Ambulatory Visit (INDEPENDENT_AMBULATORY_CARE_PROVIDER_SITE_OTHER): Payer: Medicare Other | Admitting: Family Medicine

## 2021-11-23 VITALS — BP 132/70 | HR 76 | Temp 98.2°F | Ht 70.0 in | Wt 232.4 lb

## 2021-11-23 DIAGNOSIS — I10 Essential (primary) hypertension: Secondary | ICD-10-CM

## 2021-11-23 DIAGNOSIS — Z23 Encounter for immunization: Secondary | ICD-10-CM

## 2021-11-23 DIAGNOSIS — J439 Emphysema, unspecified: Secondary | ICD-10-CM | POA: Diagnosis not present

## 2021-11-23 DIAGNOSIS — I428 Other cardiomyopathies: Secondary | ICD-10-CM | POA: Diagnosis not present

## 2021-11-23 DIAGNOSIS — E118 Type 2 diabetes mellitus with unspecified complications: Secondary | ICD-10-CM

## 2021-11-23 DIAGNOSIS — E785 Hyperlipidemia, unspecified: Secondary | ICD-10-CM | POA: Diagnosis not present

## 2021-11-23 DIAGNOSIS — G720 Drug-induced myopathy: Secondary | ICD-10-CM | POA: Diagnosis not present

## 2021-11-23 DIAGNOSIS — E039 Hypothyroidism, unspecified: Secondary | ICD-10-CM | POA: Diagnosis not present

## 2021-11-23 LAB — LIPID PANEL
Cholesterol: 116 mg/dL (ref 0–200)
HDL: 32.5 mg/dL — ABNORMAL LOW (ref >39.00)
LDL Cholesterol: 44 mg/dL (ref 0–99)
NonHDL: 83.02
Total CHOL/HDL Ratio: 4
Triglycerides: 193 mg/dL — ABNORMAL HIGH (ref 0.0–149.0)
VLDL: 38.6 mg/dL (ref 0.0–40.0)

## 2021-11-23 LAB — CBC WITH DIFFERENTIAL/PLATELET
Basophils Absolute: 0.1 10*3/uL (ref 0.0–0.1)
Basophils Relative: 0.7 % (ref 0.0–3.0)
Eosinophils Absolute: 0.4 10*3/uL (ref 0.0–0.7)
Eosinophils Relative: 4.5 % (ref 0.0–5.0)
HCT: 39.3 % (ref 39.0–52.0)
Hemoglobin: 13.1 g/dL (ref 13.0–17.0)
Lymphocytes Relative: 27 % (ref 12.0–46.0)
Lymphs Abs: 2.1 10*3/uL (ref 0.7–4.0)
MCHC: 33.2 g/dL (ref 30.0–36.0)
MCV: 84.2 fl (ref 78.0–100.0)
Monocytes Absolute: 0.9 10*3/uL (ref 0.1–1.0)
Monocytes Relative: 11.4 % (ref 3.0–12.0)
Neutro Abs: 4.5 10*3/uL (ref 1.4–7.7)
Neutrophils Relative %: 56.4 % (ref 43.0–77.0)
Platelets: 420 10*3/uL — ABNORMAL HIGH (ref 150.0–400.0)
RBC: 4.67 Mil/uL (ref 4.22–5.81)
RDW: 15.9 % — ABNORMAL HIGH (ref 11.5–15.5)
WBC: 7.9 10*3/uL (ref 4.0–10.5)

## 2021-11-23 LAB — COMPREHENSIVE METABOLIC PANEL
ALT: 15 U/L (ref 0–53)
AST: 22 U/L (ref 0–37)
Albumin: 3.7 g/dL (ref 3.5–5.2)
Alkaline Phosphatase: 83 U/L (ref 39–117)
BUN: 13 mg/dL (ref 6–23)
CO2: 29 mEq/L (ref 19–32)
Calcium: 8.9 mg/dL (ref 8.4–10.5)
Chloride: 102 mEq/L (ref 96–112)
Creatinine, Ser: 1.29 mg/dL (ref 0.40–1.50)
GFR: 55.4 mL/min — ABNORMAL LOW (ref >60.00)
Glucose, Bld: 84 mg/dL (ref 70–99)
Potassium: 3.6 mEq/L (ref 3.5–5.1)
Sodium: 138 mEq/L (ref 135–145)
Total Bilirubin: 0.5 mg/dL (ref 0.2–1.2)
Total Protein: 7.1 g/dL (ref 6.0–8.3)
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
## 2021-11-23 NOTE — Patient Instructions (Addendum)
Please schedule diabetic eye exam and have sent to [place] at [phone].  Let us know when you get your [facility] vaccine at the pharmacy.  Please stop by lab before you go If you have mychart- we will send your results within 3 business days of [place] receiving them.  If you do not have mychart- we will call you about results within 5 business days of [place] receiving them.  *please also note that you will see labs on mychart as soon as they post. I will later go in and write notes on them- will say "notes from Dr. Yong Channel"   Recommended follow up: Return in about 6 months (around 05/24/2022) for physical or sooner if needed.Schedule b4 you leave.

## 2021-11-24 LAB — TSH: TSH: 1.98 u[IU]/mL (ref 0.35–5.50)

## 2021-11-27 ENCOUNTER — Ambulatory Visit (HOSPITAL_BASED_OUTPATIENT_CLINIC_OR_DEPARTMENT_OTHER): Payer: Medicare Other | Admitting: Physical Therapy

## 2021-11-27 DIAGNOSIS — M545 Low back pain, unspecified: Secondary | ICD-10-CM | POA: Diagnosis not present

## 2021-11-27 DIAGNOSIS — G8929 Other chronic pain: Secondary | ICD-10-CM | POA: Diagnosis not present

## 2021-11-27 DIAGNOSIS — M5134 Other intervertebral disc degeneration, thoracic region: Secondary | ICD-10-CM | POA: Diagnosis not present

## 2021-11-27 DIAGNOSIS — M546 Pain in thoracic spine: Secondary | ICD-10-CM | POA: Diagnosis not present

## 2021-11-27 DIAGNOSIS — M1711 Unilateral primary osteoarthritis, right knee: Secondary | ICD-10-CM | POA: Diagnosis not present

## 2021-11-27 DIAGNOSIS — M5459 Other low back pain: Secondary | ICD-10-CM

## 2021-11-27 DIAGNOSIS — M47816 Spondylosis without myelopathy or radiculopathy, lumbar region: Secondary | ICD-10-CM | POA: Diagnosis not present

## 2021-11-27 LAB — HEMOGLOBIN A1C: Hgb A1c MFr Bld: 5.8 % (ref 4.6–6.5)
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
## 2021-11-28 ENCOUNTER — Encounter (HOSPITAL_BASED_OUTPATIENT_CLINIC_OR_DEPARTMENT_OTHER): Payer: Self-pay | Admitting: Physical Therapy

## 2021-11-29 ENCOUNTER — Ambulatory Visit (HOSPITAL_BASED_OUTPATIENT_CLINIC_OR_DEPARTMENT_OTHER): Payer: Medicare Other | Admitting: Physical Therapy

## 2021-11-29 DIAGNOSIS — M1711 Unilateral primary osteoarthritis, right knee: Secondary | ICD-10-CM | POA: Diagnosis not present

## 2021-11-29 DIAGNOSIS — M545 Low back pain, unspecified: Secondary | ICD-10-CM | POA: Diagnosis not present

## 2021-11-29 DIAGNOSIS — M5459 Other low back pain: Secondary | ICD-10-CM

## 2021-11-29 DIAGNOSIS — M546 Pain in thoracic spine: Secondary | ICD-10-CM | POA: Diagnosis not present

## 2021-11-29 DIAGNOSIS — M47816 Spondylosis without myelopathy or radiculopathy, lumbar region: Secondary | ICD-10-CM | POA: Diagnosis not present

## 2021-11-29 DIAGNOSIS — M5134 Other intervertebral disc degeneration, thoracic region: Secondary | ICD-10-CM | POA: Diagnosis not present

## 2021-11-29 DIAGNOSIS — G8929 Other chronic pain: Secondary | ICD-10-CM | POA: Diagnosis not present
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
## 2021-11-30 ENCOUNTER — Encounter (HOSPITAL_BASED_OUTPATIENT_CLINIC_OR_DEPARTMENT_OTHER): Payer: Self-pay | Admitting: Physical Therapy

## 2021-12-04 ENCOUNTER — Ambulatory Visit (HOSPITAL_BASED_OUTPATIENT_CLINIC_OR_DEPARTMENT_OTHER): Payer: Medicare Other | Attending: Nurse Practitioner | Admitting: Physical Therapy

## 2021-12-04 DIAGNOSIS — M5459 Other low back pain: Secondary | ICD-10-CM | POA: Insufficient documentation

## 2021-12-04 DIAGNOSIS — M25561 Pain in right knee: Secondary | ICD-10-CM | POA: Insufficient documentation

## 2021-12-04 DIAGNOSIS — M6281 Muscle weakness (generalized): Secondary | ICD-10-CM | POA: Diagnosis not present

## 2021-12-04 DIAGNOSIS — M546 Pain in thoracic spine: Secondary | ICD-10-CM | POA: Diagnosis not present
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
## 2021-12-05 ENCOUNTER — Encounter (HOSPITAL_BASED_OUTPATIENT_CLINIC_OR_DEPARTMENT_OTHER): Payer: Self-pay | Admitting: Physical Therapy

## 2021-12-06 ENCOUNTER — Ambulatory Visit (HOSPITAL_BASED_OUTPATIENT_CLINIC_OR_DEPARTMENT_OTHER): Payer: Medicare Other | Admitting: Physical Therapy

## 2021-12-06 DIAGNOSIS — M6281 Muscle weakness (generalized): Secondary | ICD-10-CM | POA: Diagnosis not present

## 2021-12-06 DIAGNOSIS — M25561 Pain in right knee: Secondary | ICD-10-CM | POA: Diagnosis not present

## 2021-12-06 DIAGNOSIS — M5459 Other low back pain: Secondary | ICD-10-CM | POA: Diagnosis not present

## 2021-12-06 DIAGNOSIS — M546 Pain in thoracic spine: Secondary | ICD-10-CM
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
## 2021-12-07 ENCOUNTER — Encounter (HOSPITAL_BASED_OUTPATIENT_CLINIC_OR_DEPARTMENT_OTHER): Payer: Self-pay | Admitting: Physical Therapy

## 2021-12-09 ENCOUNTER — Other Ambulatory Visit: Payer: Self-pay | Admitting: Family Medicine

## 2021-12-11 ENCOUNTER — Ambulatory Visit (HOSPITAL_BASED_OUTPATIENT_CLINIC_OR_DEPARTMENT_OTHER): Payer: Medicare Other | Admitting: Physical Therapy

## 2021-12-11 ENCOUNTER — Encounter (HOSPITAL_BASED_OUTPATIENT_CLINIC_OR_DEPARTMENT_OTHER): Payer: Self-pay | Admitting: Physical Therapy

## 2021-12-11 DIAGNOSIS — M6281 Muscle weakness (generalized): Secondary | ICD-10-CM

## 2021-12-11 DIAGNOSIS — M25561 Pain in right knee: Secondary | ICD-10-CM

## 2021-12-11 DIAGNOSIS — M5459 Other low back pain: Secondary | ICD-10-CM | POA: Diagnosis not present

## 2021-12-11 DIAGNOSIS — M546 Pain in thoracic spine: Secondary | ICD-10-CM | POA: Diagnosis not present
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
## 2021-12-12 ENCOUNTER — Encounter (HOSPITAL_BASED_OUTPATIENT_CLINIC_OR_DEPARTMENT_OTHER): Payer: Medicare Other | Admitting: Physical Therapy

## 2021-12-13 ENCOUNTER — Ambulatory Visit (HOSPITAL_BASED_OUTPATIENT_CLINIC_OR_DEPARTMENT_OTHER): Payer: Medicare Other | Admitting: Physical Therapy

## 2021-12-18 ENCOUNTER — Encounter (HOSPITAL_BASED_OUTPATIENT_CLINIC_OR_DEPARTMENT_OTHER): Payer: Medicare Other | Admitting: Physical Therapy

## 2021-12-18 DIAGNOSIS — M1711 Unilateral primary osteoarthritis, right knee: Secondary | ICD-10-CM | POA: Diagnosis not present

## 2021-12-19 ENCOUNTER — Ambulatory Visit (HOSPITAL_BASED_OUTPATIENT_CLINIC_OR_DEPARTMENT_OTHER): Payer: Medicare Other | Admitting: Physical Therapy

## 2021-12-19 ENCOUNTER — Encounter (HOSPITAL_BASED_OUTPATIENT_CLINIC_OR_DEPARTMENT_OTHER): Payer: Self-pay | Admitting: Physical Therapy

## 2021-12-19 DIAGNOSIS — M6281 Muscle weakness (generalized): Secondary | ICD-10-CM | POA: Diagnosis not present

## 2021-12-19 DIAGNOSIS — M5459 Other low back pain: Secondary | ICD-10-CM

## 2021-12-19 DIAGNOSIS — M25561 Pain in right knee: Secondary | ICD-10-CM

## 2021-12-19 DIAGNOSIS — M546 Pain in thoracic spine: Secondary | ICD-10-CM
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
## 2021-12-25 ENCOUNTER — Encounter: Payer: Self-pay | Admitting: *Deleted

## 2021-12-26 DIAGNOSIS — M25562 Pain in left knee: Secondary | ICD-10-CM | POA: Diagnosis not present

## 2021-12-27 ENCOUNTER — Ambulatory Visit (HOSPITAL_BASED_OUTPATIENT_CLINIC_OR_DEPARTMENT_OTHER): Payer: Medicare Other | Admitting: Physical Therapy

## 2021-12-27 ENCOUNTER — Encounter (HOSPITAL_BASED_OUTPATIENT_CLINIC_OR_DEPARTMENT_OTHER): Payer: Self-pay | Admitting: Physical Therapy

## 2021-12-27 DIAGNOSIS — M25561 Pain in right knee: Secondary | ICD-10-CM

## 2021-12-27 DIAGNOSIS — M6281 Muscle weakness (generalized): Secondary | ICD-10-CM

## 2021-12-27 DIAGNOSIS — M5459 Other low back pain: Secondary | ICD-10-CM | POA: Diagnosis not present

## 2021-12-27 DIAGNOSIS — M546 Pain in thoracic spine: Secondary | ICD-10-CM
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
## 2021-12-30 ENCOUNTER — Emergency Department (HOSPITAL_BASED_OUTPATIENT_CLINIC_OR_DEPARTMENT_OTHER)
Admission: EM | Admit: 2021-12-30 | Discharge: 2021-12-30 | Disposition: A | Discharge status: Home / Self Care | Payer: Medicare Other | Attending: Emergency Medicine | Admitting: Emergency Medicine

## 2021-12-30 ENCOUNTER — Emergency Department (HOSPITAL_BASED_OUTPATIENT_CLINIC_OR_DEPARTMENT_OTHER): Payer: Medicare Other

## 2021-12-30 ENCOUNTER — Other Ambulatory Visit: Payer: Self-pay

## 2021-12-30 ENCOUNTER — Encounter (HOSPITAL_BASED_OUTPATIENT_CLINIC_OR_DEPARTMENT_OTHER): Payer: Self-pay

## 2021-12-30 DIAGNOSIS — R109 Unspecified abdominal pain: Secondary | ICD-10-CM | POA: Diagnosis not present

## 2021-12-30 DIAGNOSIS — N281 Cyst of kidney, acquired: Secondary | ICD-10-CM | POA: Diagnosis not present

## 2021-12-30 DIAGNOSIS — Z7982 Long term (current) use of aspirin: Secondary | ICD-10-CM | POA: Insufficient documentation

## 2021-12-30 LAB — LIPASE, BLOOD: Lipase: 62 U/L — ABNORMAL HIGH (ref 11–51)

## 2021-12-30 LAB — HEPATIC FUNCTION PANEL
ALT: 17 U/L (ref 0–44)
AST: 21 U/L (ref 15–41)
Albumin: 4.1 g/dL (ref 3.5–5.0)
Alkaline Phosphatase: 72 U/L (ref 38–126)
Bilirubin, Direct: 0.1 mg/dL (ref 0.0–0.2)
Indirect Bilirubin: 0.4 mg/dL (ref 0.3–0.9)
Total Bilirubin: 0.5 mg/dL (ref 0.3–1.2)
Total Protein: 7.5 g/dL (ref 6.5–8.1)

## 2021-12-30 LAB — URINALYSIS, ROUTINE W REFLEX MICROSCOPIC
Bilirubin Urine: NEGATIVE
Glucose, UA: NEGATIVE mg/dL
Ketones, ur: NEGATIVE mg/dL
Leukocytes,Ua: NEGATIVE
Nitrite: NEGATIVE
Specific Gravity, Urine: 1.024 (ref 1.005–1.030)
pH: 5.5 (ref 5.0–8.0)

## 2021-12-30 LAB — BASIC METABOLIC PANEL
Anion gap: 7 (ref 5–15)
BUN: 25 mg/dL — ABNORMAL HIGH (ref 8–23)
CO2: 25 mmol/L (ref 22–32)
Calcium: 9.3 mg/dL (ref 8.9–10.3)
Chloride: 105 mmol/L (ref 98–111)
Creatinine, Ser: 1.45 mg/dL — ABNORMAL HIGH (ref 0.61–1.24)
GFR, Estimated: 51 mL/min — ABNORMAL LOW (ref >60)
Glucose, Bld: 88 mg/dL (ref 70–99)
Potassium: 4.2 mmol/L (ref 3.5–5.1)
Sodium: 137 mmol/L (ref 135–145)

## 2021-12-30 LAB — CBC
HCT: 42.4 % (ref 39.0–52.0)
Hemoglobin: 13.7 g/dL (ref 13.0–17.0)
MCH: 27.8 pg (ref 26.0–34.0)
MCHC: 32.3 g/dL (ref 30.0–36.0)
MCV: 86 fL (ref 80.0–100.0)
Platelets: 484 10*3/uL — ABNORMAL HIGH (ref 150–400)
RBC: 4.93 MIL/uL (ref 4.22–5.81)
RDW: 15.9 % — ABNORMAL HIGH (ref 11.5–15.5)
WBC: 9.6 10*3/uL (ref 4.0–10.5)
nRBC: 0 % (ref 0.0–0.2)

## 2021-12-30 MED ORDER — SODIUM CHLORIDE 0.9 % IV BOLUS
1000.0000 mL | Freq: Once | INTRAVENOUS | Status: AC
  Administered 2021-12-30: 1000 mL via INTRAVENOUS

## 2021-12-30 MED ORDER — CYCLOBENZAPRINE HCL 5 MG PO TABS: 5.0000 mg | ORAL_TABLET | Freq: Two times a day (BID) | ORAL | 0 refills | Status: DC | PRN

## 2021-12-30 MED ORDER — LIDOCAINE 5 % EX PTCH
1.0000 | MEDICATED_PATCH | Freq: Once | CUTANEOUS | Status: DC
  Administered 2021-12-30: 1 via TRANSDERMAL
  Filled 2021-12-30: qty 1

## 2021-12-30 MED ORDER — LIDOCAINE 5 % EX PTCH: 1.0000 | MEDICATED_PATCH | CUTANEOUS | 0 refills | Status: DC

## 2021-12-30 NOTE — ED Provider Notes (Signed)
[facility] Provider Note   CSN: [id number] Arrival date & time: 12/30/21  1249     History  Chief Complaint  Patient presents with   Flank Pain    [name] is a 71 y.o. male.  HPI 71 year old male presents with left-sided flank pain.  Is been ongoing for at least 1 week though after him talking to him more he thinks it might be 2 or 3 weeks.  He has a history of Crohn's, arthritis, previous DVT but not on blood thinners, and TBI.  The pain seems to be constant but is worse whenever he tries to move or twist.  Sitting still gives the most minimal pain.  No leg weakness or numbness, incontinence, or saddle anesthesia.  No fevers.  No abdominal pain, chest pain, shortness of breath.  He has been taking Tylenol with no relief.  Home Medications Prior to Admission medications   Medication Sig Start Date End Date Taking? Authorizing Provider  cyclobenzaprine (FLEXERIL) 5 MG tablet Take 1 tablet (5 mg total) by mouth 2 (two) times daily as needed for muscle spasms. 12/30/21  Yes Sherwood Gambler, MD  lidocaine (LIDODERM) 5 % Place 1 patch onto the skin daily. Remove & Discard patch within 12 hours or as directed by MD 12/30/21  Yes Sherwood Gambler, MD  aspirin 81 MG chewable tablet Chew by mouth daily.    [provider]  carvedilol (COREG) 6.25 MG tablet Take 6.25 mg by mouth 2 (two) times daily. 12/20/20   [provider]  Garlic 3474 MG CAPS Take by mouth.    [provider]  levothyroxine (SYNTHROID) 200 MCG tablet TAKE 1 TABLET (200 MCG TOTAL) BY MOUTH DAILY BEFORE BREAKFAST. 12/11/21   Marin Olp, MD  magnesium 30 MG tablet Take 30 mg by mouth 2 (two) times daily.    [provider]  mesalamine (PENTASA) 500 MG CR capsule Take 1 capsule (500 mg total) by mouth 3 (three) times daily. Patient not taking: Reported on 11/15/2021 10/11/21   Vladimir Crofts, PA-C  Multiple Vitamins-Minerals (ONE-A-DAY MENS  HEALTH FORMULA) TABS Take by mouth.    [provider]  pantoprazole (PROTONIX) 40 MG tablet Take 1 tablet (40 mg total) by mouth 2 (two) times daily before a meal. 10/11/21   Vladimir Crofts, PA-C  polyethylene glycol (MIRALAX / GLYCOLAX) 17 g packet Take 17 g by mouth daily.    [provider]  potassium gluconate 595 (99 K) MG TABS tablet Take 595 mg by mouth.    [provider]  predniSONE (DELTASONE) 10 MG tablet 5 tab day 1, 4 tab day 2-3, 3 tab day 4-5, 2 tab day 6-7 Patient not taking: Reported on 10/11/2021 07/05/21   Jeanie Sewer, NP  tamsulosin (FLOMAX) 0.4 MG CAPS capsule TAKE 1 CAPSULE BY MOUTH 2 TIMES DAILY 06/19/21   Franchot Gallo, MD  vitamin B-12 (CYANOCOBALAMIN) 1000 MCG tablet Take 1,000 mcg by mouth daily.    [provider]      Allergies    Lisinopril and Coconut flavor    Review of Systems   Review of Systems  Constitutional:  Negative for fever.  Respiratory:  Negative for shortness of breath.   Cardiovascular:  Negative for chest pain.  Gastrointestinal:  Negative for abdominal pain and vomiting.  Genitourinary:  Positive for flank pain and frequency. Negative for dysuria.  Musculoskeletal:  Negative for back pain.  Neurological:  Negative for weakness and numbness.  Physical Exam Updated Vital Signs BP (!) 151/89   Pulse 73   Temp 98.2 F (36.8 C)   Resp 17   Ht 5' 11"  (1.803 m)   Wt 104.3 kg   SpO2 94%   BMI 32.08 kg/m  Physical Exam Vitals and nursing note reviewed.  Constitutional:      Appearance: He is well-developed. He is obese.  HENT:     Head: Normocephalic and atraumatic.  Cardiovascular:     Rate and Rhythm: Normal rate and regular rhythm.     Heart sounds: Normal heart sounds.  Pulmonary:     Effort: Pulmonary effort is normal.     Breath sounds: Normal breath sounds.  Abdominal:     Palpations: Abdomen is soft.     Tenderness: There is no abdominal tenderness. There is no right CVA  tenderness or left CVA tenderness.       Comments: Mild tenderness in mid-axillary line, mostly and inferior ribs. No rash  Musculoskeletal:     Thoracic back: No bony tenderness.     Lumbar back: No bony tenderness.  Skin:    General: Skin is warm and dry.  Neurological:     Mental Status: He is alert.     Comments: 5/5 strength in BLE. Grossly normal sensation     ED Results / Procedures / Treatments   Labs (all labs ordered are listed, but only abnormal results are displayed) Labs Reviewed  URINALYSIS, ROUTINE W REFLEX MICROSCOPIC - Abnormal; Notable for the following components:      Result Value   Hgb urine dipstick TRACE (*)    Protein, ur TRACE (*)    All other components within normal limits  CBC - Abnormal; Notable for the following components:   RDW 15.9 (*)    Platelets 484 (*)    All other components within normal limits  BASIC METABOLIC PANEL - Abnormal; Notable for the following components:   BUN 25 (*)    Creatinine, Ser 1.45 (*)    GFR, Estimated 51 (*)    All other components within normal limits  LIPASE, BLOOD - Abnormal; Notable for the following components:   Lipase 62 (*)    All other components within normal limits  HEPATIC FUNCTION PANEL    EKG None  Radiology CT Renal Stone Study  Result Date: 12/30/2021 CLINICAL DATA:  Left flank pain, concern for kidney stone. EXAM: CT ABDOMEN AND PELVIS WITHOUT CONTRAST TECHNIQUE: Multidetector CT imaging of the abdomen and pelvis was performed following the standard protocol without IV contrast. RADIATION DOSE REDUCTION: This exam was performed according to the departmental dose-optimization program which includes automated exposure control, adjustment of the mA and/or kV according to patient size and/or use of iterative reconstruction technique. COMPARISON:  CT abdomen pelvis dated 10/26/2021. FINDINGS: Lower chest: No acute abnormality. Hepatobiliary: No focal liver abnormality is seen. Status post  cholecystectomy. No biliary dilatation. Pancreas: Unremarkable. No pancreatic ductal dilatation or surrounding inflammatory changes. Spleen: Multiple splenules are noted throughout the left hemiabdomen, unchanged. Adrenals/Urinary Tract: Adrenal glands are unremarkable. Benign cysts in the left kidney are redemonstrated. No imaging follow-up is recommended for these findings. Otherwise, the kidneys are normal, without renal calculi, suspicious focal lesion, or hydronephrosis. Bladder is unremarkable. Stomach/Bowel: Stomach is within normal limits. The patient is status post a ileocecal ectomy. Appendix appears normal. Mild bowel wall thickening involving the distal ileum appears unchanged. No associated fat stranding to suggest active inflammation. No evidence of bowel distention or inflammatory changes. Vascular/Lymphatic:  Aortic atherosclerosis. No enlarged abdominal or pelvic lymph nodes. Reproductive: The prostate is enlarged, measuring 6.8 cm in transverse dimension. Other: Multiple ventral midline fat containing hernias are noted. No free intraperitoneal fluid. Musculoskeletal: Degenerative changes are seen in the spine. IMPRESSION: 1. No acute findings in the abdomen or pelvis. No findings to explain the patient's symptoms. Aortic Atherosclerosis (ICD10-I70.0). Electronically Signed   By: Zerita Boers M.D.   On: 12/30/2021 16:21    Procedures Procedures    Medications Ordered in ED Medications  lidocaine (LIDODERM) 5 % 1 patch (has no administration in time range)  sodium chloride 0.9 % bolus 1,000 mL (1,000 mLs Intravenous New Bag/Given 12/30/21 1633)    ED Course/ Medical Decision Making/ A&P                           Medical Decision Making Amount and/or Complexity of Data Reviewed Independent Historian:     Details: Daughter External Data Reviewed: notes. Labs: ordered.    Details: Normal WBC, hemoglobin.  Creatinine slightly worse than baseline but not consistent with AKI.   Unremarkable LFTs.  Lipase not consistent with pancreatitis.  UA with small amount of blood but no UTI Radiology: ordered and independent interpretation performed.    Details: No ureteral or kidney stone  Risk Prescription drug management.   I suspect based on the time of duration as well as movement induced pain, this is probably muscular or more superficial.  CT images viewed/interpreted by myself and no kidney stone or other emergent condition.  No UTI.  Lab work overall unremarkable as above.  He declines anything significant for pain here.  We will put on a Lidoderm patch and do a small dose of Flexeril to see if this helps in addition to his home meds.  Otherwise he already has PCP follow-up next week.  Discussed return precautions but at this point doubt acute spinal or intra-abdominal emergency.        Final Clinical Impression(s) / ED Diagnoses Final diagnoses:  Left flank pain    Rx / DC Orders ED Discharge Orders          Ordered    lidocaine (LIDODERM) 5 %  Every 24 hours        12/30/21 1722    cyclobenzaprine (FLEXERIL) 5 MG tablet  2 times daily PRN        12/30/21 1722              Sherwood Gambler, MD 12/30/21 1725

## 2021-12-30 NOTE — Discharge Instructions (Addendum)
At this point, there is no clear cause of why your flank is hurting.  For now, continue your typical meds and we will add a muscle relaxer and the topical numbing medicine.  However, if you develop fever, severe or worsening pain, weakness or numbness in the extremities, or any other new/concerning symptoms and return to the ER for evaluation.

## 2021-12-30 NOTE — ED Triage Notes (Signed)
Patient here POV from Home.  Endorses Left Side Flank pain that began 3 Days ago. Mostly Consistent since it began.  Associated with Night Sweats, Darker Urine. No N/V/D.   NAD Noted during Triage. A&Ox4. GCS 15. Ambulatory.

## 2022-01-03 ENCOUNTER — Ambulatory Visit (HOSPITAL_BASED_OUTPATIENT_CLINIC_OR_DEPARTMENT_OTHER): Payer: Medicare Other | Admitting: Physical Therapy

## 2022-01-03 ENCOUNTER — Ambulatory Visit: Payer: Medicare Other | Admitting: Internal Medicine

## 2022-01-03 DIAGNOSIS — R0609 Other forms of dyspnea: Secondary | ICD-10-CM | POA: Diagnosis not present

## 2022-01-03 DIAGNOSIS — G4733 Obstructive sleep apnea (adult) (pediatric): Secondary | ICD-10-CM | POA: Diagnosis not present

## 2022-01-03 DIAGNOSIS — I428 Other cardiomyopathies: Secondary | ICD-10-CM | POA: Diagnosis not present

## 2022-01-03 DIAGNOSIS — M546 Pain in thoracic spine: Secondary | ICD-10-CM | POA: Diagnosis not present

## 2022-01-03 DIAGNOSIS — M47814 Spondylosis without myelopathy or radiculopathy, thoracic region: Secondary | ICD-10-CM | POA: Diagnosis not present

## 2022-01-03 DIAGNOSIS — R9430 Abnormal result of cardiovascular function study, unspecified: Secondary | ICD-10-CM | POA: Diagnosis not present

## 2022-01-03 DIAGNOSIS — M47816 Spondylosis without myelopathy or radiculopathy, lumbar region: Secondary | ICD-10-CM | POA: Diagnosis not present

## 2022-01-03 DIAGNOSIS — I10 Essential (primary) hypertension: Secondary | ICD-10-CM | POA: Diagnosis not present

## 2022-01-03 DIAGNOSIS — I2510 Atherosclerotic heart disease of native coronary artery without angina pectoris: Secondary | ICD-10-CM | POA: Diagnosis not present

## 2022-01-03 DIAGNOSIS — I2584 Coronary atherosclerosis due to calcified coronary lesion: Secondary | ICD-10-CM | POA: Diagnosis not present

## 2022-01-05 ENCOUNTER — Encounter (HOSPITAL_BASED_OUTPATIENT_CLINIC_OR_DEPARTMENT_OTHER): Payer: Medicare Other | Admitting: Physical Therapy

## 2022-01-06 ENCOUNTER — Other Ambulatory Visit: Payer: Self-pay | Admitting: Physician Assistant

## 2022-01-10 ENCOUNTER — Encounter (HOSPITAL_BASED_OUTPATIENT_CLINIC_OR_DEPARTMENT_OTHER): Payer: Medicare Other | Admitting: Physical Therapy

## 2022-01-10 DIAGNOSIS — M1711 Unilateral primary osteoarthritis, right knee: Secondary | ICD-10-CM | POA: Diagnosis not present

## 2022-01-10 DIAGNOSIS — I428 Other cardiomyopathies: Secondary | ICD-10-CM | POA: Diagnosis not present

## 2022-01-10 DIAGNOSIS — I2584 Coronary atherosclerosis due to calcified coronary lesion: Secondary | ICD-10-CM | POA: Diagnosis not present

## 2022-01-10 DIAGNOSIS — R0609 Other forms of dyspnea: Secondary | ICD-10-CM | POA: Diagnosis not present

## 2022-01-10 DIAGNOSIS — G4733 Obstructive sleep apnea (adult) (pediatric): Secondary | ICD-10-CM | POA: Diagnosis not present

## 2022-01-10 DIAGNOSIS — I2510 Atherosclerotic heart disease of native coronary artery without angina pectoris: Secondary | ICD-10-CM | POA: Diagnosis not present

## 2022-01-10 DIAGNOSIS — I10 Essential (primary) hypertension: Secondary | ICD-10-CM | POA: Diagnosis not present

## 2022-01-16 ENCOUNTER — Encounter: Payer: Self-pay | Admitting: Family Medicine

## 2022-01-17 ENCOUNTER — Encounter (HOSPITAL_BASED_OUTPATIENT_CLINIC_OR_DEPARTMENT_OTHER): Payer: Medicare Other | Admitting: Physical Therapy

## 2022-01-17 DIAGNOSIS — I428 Other cardiomyopathies: Secondary | ICD-10-CM | POA: Diagnosis not present

## 2022-01-17 DIAGNOSIS — M1711 Unilateral primary osteoarthritis, right knee: Secondary | ICD-10-CM | POA: Diagnosis not present

## 2022-01-17 DIAGNOSIS — R0609 Other forms of dyspnea: Secondary | ICD-10-CM | POA: Diagnosis not present

## 2022-01-24 DIAGNOSIS — M1711 Unilateral primary osteoarthritis, right knee: Secondary | ICD-10-CM | POA: Diagnosis not present

## 2022-01-29 DIAGNOSIS — M47814 Spondylosis without myelopathy or radiculopathy, thoracic region: Secondary | ICD-10-CM | POA: Diagnosis not present

## 2022-02-04 ENCOUNTER — Other Ambulatory Visit: Payer: Self-pay | Admitting: Urology

## 2022-02-05 DIAGNOSIS — M47814 Spondylosis without myelopathy or radiculopathy, thoracic region: Secondary | ICD-10-CM | POA: Diagnosis not present

## 2022-02-06 NOTE — Progress Notes (Signed)
[facility] Quality Team Note  Name: [name] Date of Birth: 10-Sep-1950 MRN: [id number] Date: 02/06/2022  [facility] Quality Team has reviewed this patient's chart, please see recommendations below:  [facility] Quality Other; ([place]- PATIENT NEEDS URINE MICROALBUMIN/CREATININE RATIO FOR GAP CLOSURE. EGFR ALREADY COMPLETED IN 2023)

## 2022-02-28 DIAGNOSIS — M1712 Unilateral primary osteoarthritis, left knee: Secondary | ICD-10-CM | POA: Diagnosis not present

## 2022-02-28 DIAGNOSIS — M25562 Pain in left knee: Secondary | ICD-10-CM | POA: Diagnosis not present

## 2022-03-08 DIAGNOSIS — M25562 Pain in left knee: Secondary | ICD-10-CM | POA: Diagnosis not present

## 2022-03-08 DIAGNOSIS — M1712 Unilateral primary osteoarthritis, left knee: Secondary | ICD-10-CM | POA: Diagnosis not present

## 2022-03-14 DIAGNOSIS — M25562 Pain in left knee: Secondary | ICD-10-CM | POA: Diagnosis not present

## 2022-03-14 DIAGNOSIS — M1712 Unilateral primary osteoarthritis, left knee: Secondary | ICD-10-CM | POA: Diagnosis not present

## 2022-03-15 ENCOUNTER — Ambulatory Visit: Payer: Medicare Other | Admitting: Internal Medicine

## 2022-03-15 ENCOUNTER — Other Ambulatory Visit: Payer: Self-pay | Admitting: Internal Medicine

## 2022-03-15 ENCOUNTER — Ambulatory Visit (INDEPENDENT_AMBULATORY_CARE_PROVIDER_SITE_OTHER): Payer: Medicare Other | Admitting: Family Medicine

## 2022-03-15 ENCOUNTER — Encounter: Payer: Self-pay | Admitting: Internal Medicine

## 2022-03-15 ENCOUNTER — Other Ambulatory Visit (INDEPENDENT_AMBULATORY_CARE_PROVIDER_SITE_OTHER): Payer: Medicare Other

## 2022-03-15 ENCOUNTER — Encounter: Payer: Self-pay | Admitting: Family Medicine

## 2022-03-15 VITALS — BP 140/92 | HR 74 | Ht 71.0 in | Wt 230.0 lb

## 2022-03-15 VITALS — BP 140/80 | HR 79 | Temp 98.0°F | Ht 71.0 in | Wt 230.2 lb

## 2022-03-15 DIAGNOSIS — K219 Gastro-esophageal reflux disease without esophagitis: Secondary | ICD-10-CM

## 2022-03-15 DIAGNOSIS — G720 Drug-induced myopathy: Secondary | ICD-10-CM | POA: Diagnosis not present

## 2022-03-15 DIAGNOSIS — Z800 Family history of malignant neoplasm of digestive organs: Secondary | ICD-10-CM | POA: Diagnosis not present

## 2022-03-15 DIAGNOSIS — E039 Hypothyroidism, unspecified: Secondary | ICD-10-CM

## 2022-03-15 DIAGNOSIS — I10 Essential (primary) hypertension: Secondary | ICD-10-CM

## 2022-03-15 DIAGNOSIS — E118 Type 2 diabetes mellitus with unspecified complications: Secondary | ICD-10-CM

## 2022-03-15 DIAGNOSIS — I428 Other cardiomyopathies: Secondary | ICD-10-CM

## 2022-03-15 DIAGNOSIS — K5000 Crohn's disease of small intestine without complications: Secondary | ICD-10-CM | POA: Diagnosis not present

## 2022-03-15 DIAGNOSIS — K2940 Chronic atrophic gastritis without bleeding: Secondary | ICD-10-CM

## 2022-03-15 DIAGNOSIS — K50019 Crohn's disease of small intestine with unspecified complications: Secondary | ICD-10-CM | POA: Diagnosis not present

## 2022-03-15 LAB — HEMOGLOBIN A1C: Hgb A1c MFr Bld: 5.6 % (ref 4.6–6.5)

## 2022-03-15 LAB — TSH: TSH: 2.38 u[IU]/mL (ref 0.35–5.50)

## 2022-03-15 LAB — VITAMIN B12: Vitamin B-12: 845 pg/mL (ref 211–911)

## 2022-03-15 MED ORDER — PANTOPRAZOLE SODIUM 40 MG PO TBEC: 40.0000 mg | DELAYED_RELEASE_TABLET | Freq: Every day | ORAL | 1 refills | Status: AC

## 2022-03-15 MED ORDER — CARVEDILOL 12.5 MG PO TABS: 12.5000 mg | ORAL_TABLET | Freq: Two times a day (BID) | ORAL | 3 refills | Status: DC

## 2022-03-15 MED ORDER — PLENVU 140 G PO SOLR: 1.0000 | Freq: Once | ORAL | 0 refills | Status: AC

## 2022-03-15 NOTE — Patient Instructions (Addendum)
If you are age 73 or older, your body mass index should be between 23-30. Your Body mass index is 32.08 kg/m. If this is out of the aforementioned range listed, please consider follow up with your Primary Care Provider.  If you are age 29 or younger, your body mass index should be between 19-25. Your Body mass index is 32.08 kg/m. If this is out of the aformentioned range listed, please consider follow up with your Primary Care Provider.   ________________________________________________________   [name] have been scheduled for a colonoscopy. Please follow written instructions given to you at your visit today.  Please pick up your prep supplies at the pharmacy within the next 1-3 days. If you use inhalers (even only as needed), please bring them with you on the day of your procedure.   Please go to the lab in the basement of our building to have lab work done as you leave today. Hit "B" for basement when you get on the elevator.  When the doors open the lab is on your left.  We will call you with the results. Thank you.  Decrease pantoprazole 40 mg to once daily.  Thank you for entrusting me with your care and for choosing [facility], Dr. Zenovia Jarred

## 2022-03-15 NOTE — Progress Notes (Signed)
Subjective:    Patient ID: Bruce [name], male    DOB: 1950-05-20, 72 y.o.   MRN: [id number]  HPI Bruce [name] is a 72 year old male with a history of Crohn's ileitis, diverticulosis, GERD, prior DVT, B12 deficiency, COPD, hypertension, hyperlipidemia, hypothyroidism, nonischemic cardiomyopathy and sleep apnea who is here for follow-up.  He was last seen for upper endoscopy on 10/19/2021.  He is here today with daughter.  EGD performed on 10/19/2021 revealed a normal esophagus.  A small gastric polyp which was removed and found to be hyperplastic.  Minimal erythema.  Gastric biopsies showed patchy chronic gastritis with atrophy, intestinal metaplasia and micronodular ECL cell hyperplasia consistent with chronic atrophic autoimmune gastritis.  No dysplasia.  He reports that he has been feeling well.  Since his upper endoscopy he has remained on pantoprazole twice daily and he has had resolution of his heartburn and indigestion symptoms.  From a bowel perspective he has frequent loose stools as much is 4-5 times per day.  He does not have to wake up at night to have bowel movements though he does wake up for 4-5 times for urination.  He does not see visible blood in his stool or melena.  He is not having abdominal pain.  However if he eats nuts, significant amounts of cheese or corn he will have crampy diffuse abdominal pain.  He avoids these foods.  His daughter states that he gets very nervous if he feels constipated or has reduced bowel movements.  She feels that this is likely related to what happened when he became obstructed and needed the initial Crohn's surgery.  Review of Systems As per HPI, otherwise negative  Current Medications, Allergies, Past Medical History, Past Surgical History, Family History and Social History were reviewed in [employer] record.    Objective:   Physical Exam BP (!) 140/92   Pulse 74   Ht '5\' 11"'$  (1.803 m)   Wt 230 lb (104.3 kg)    BMI 32.08 kg/m   Gen: awake, alert, NAD HEENT: anicteric  CV: RRR, no mrg Pulm: CTA b/l Abd: soft, obese, right-sided abdominal tenderness in the mid and lower abdomen without rebound or guarding, nondistended, +BS throughout Ext: no c/c/e Neuro: nonfocal  CT ABDOMEN AND PELVIS WITH CONTRAST (ENTEROGRAPHY)   TECHNIQUE: Multidetector CT of the abdomen and pelvis during bolus administration of intravenous contrast. Negative oral contrast was given.   RADIATION DOSE REDUCTION: This exam was performed according to the departmental dose-optimization program which includes automated exposure control, adjustment of the mA and/or kV according to patient size and/or use of iterative reconstruction technique.   CONTRAST:  178m OMNIPAQUE IOHEXOL 300 MG/ML  SOLN   COMPARISON:  11/12/2018 from [facility]   FINDINGS: Lower Chest: No acute findings.   Hepatobiliary: No hepatic masses identified. Prior cholecystectomy. No evidence of biliary obstruction.   Pancreas:  No mass or inflammatory changes.   Spleen: Multiple splenules are seen throughout the left abdomen and pelvis, and a small splenule is also seen along the posterior margin of the right hepatic lobe. These remain stable.   Adrenals/Urinary Tract: Several small bilateral Bosniak category 1 and 2 renal cysts show no significant change (no followup imaging is recommended). No suspicious masses identified. No evidence of ureteral calculi or hydronephrosis.   Stomach/Bowel: Stable postop changes from previous ileocecectomy. Mild wall thickening and mucosal enhancement of the distal ileum shows no significant change since prior study. No evidence of mesenteric inflammatory changes,  stricture, or dilatation. No signs of penetrating disease, fistulae, or abnormal fluid collections.   Vascular/Lymphatic: No pathologically enlarged lymph nodes identified. No acute vascular findings. Aortic  atherosclerotic calcification incidentally noted.   Reproductive: Stable markedly enlarged prostate gland, with mass effect on the bladder base.   Other: Several small midline ventral abdominal wall hernias are again seen which contain only fat, without change since prior study. Stable small bilateral inguinal hernias are also seen which contain only fat.   Musculoskeletal:  No suspicious bone lesions identified.   IMPRESSION: Previous ileocecectomy. Stable chronic findings of Crohn disease involving the distal ileum, with no imaging signs of acute inflammation. No evidence of stricture, bowel obstruction, or other acute findings.   Stable markedly enlarged prostate.   Stable splenosis.   Stable small ventral and bilateral inguinal hernias, which contain only fat.   Aortic Atherosclerosis (ICD10-I70.0).     Electronically Signed   By: Marlaine Hind M.D.   On: 10/28/2021 11:31  Fecal Cal 639 hsCRP 2.77 ESR 32 Quant gold neg     Latest Ref Rng & Units 12/30/2021    1:38 PM 11/23/2021    2:12 PM 10/11/2021   12:20 PM  CBC  WBC 4.0 - 10.5 K/uL 9.6  7.9  6.5   Hemoglobin 13.0 - 17.0 g/dL 13.7  13.1  13.2   Hematocrit 39.0 - 52.0 % 42.4  39.3  40.7   Platelets 150 - 400 K/uL 484  420.0  426.0    CMP     Component Value Date/Time   NA 137 12/30/2021 1338   K 4.2 12/30/2021 1338   CL 105 12/30/2021 1338   CO2 25 12/30/2021 1338   GLUCOSE 88 12/30/2021 1338   BUN 25 (H) 12/30/2021 1338   CREATININE 1.45 (H) 12/30/2021 1338   CREATININE 1.22 (H) 10/28/2019 1118   CALCIUM 9.3 12/30/2021 1338   PROT 7.5 12/30/2021 1338   ALBUMIN 4.1 12/30/2021 1338   AST 21 12/30/2021 1338   ALT 17 12/30/2021 1338   ALKPHOS 72 12/30/2021 1338   BILITOT 0.5 12/30/2021 1338   GFRNONAA 51 (L) 12/30/2021 1338   GFRNONAA 60 10/28/2019 1118   GFRAA 69 10/28/2019 1118         Assessment & Plan:   72 year old male with a history of Crohn's ileitis, diverticulosis, GERD, prior DVT,  B12 deficiency, COPD, hypertension, hyperlipidemia, hypothyroidism, nonischemic cardiomyopathy and sleep apnea who is here for follow-up.   Ileal Crohn's disease --CT reviewed which does not show any concerning features like fistula or stricture.  However, his loose frequent stools throughout the day are very likely inflammatory in nature related to his ileitis.  He has become accustomed to this and actually becomes more concerned if he goes to the bathroom infrequently.  I am not sure without complication if escalation of therapy benefits would outweigh the risk.  He did not respond to Pentasa.  It is reasonable to repeat colonoscopy to assess disease activity. --Colonoscopy in the [place] --He remains off therapy for now but we can at least discuss budesonide depending on the findings  2.  GERD/dyspepsia --symptoms resolved with twice daily pantoprazole.  Will attempt to reduce to once daily. --Reduce pantoprazole back to 40 mg once daily; if symptoms recur we can discuss increasing back to twice daily  3.  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  No H. pylori. --Check B12  4.  Family history of colon cancer --screening  colonoscopy in the setting of family history recommended at this time.  Last exam was 5 years ago --Colonoscopy in the [place]  30 minutes total spent today including patient facing time, coordination of care, reviewing medical history/procedures/pertinent radiology studies, and documentation of the encounter.

## 2022-03-15 NOTE — Progress Notes (Signed)
Phone [phone] In person visit   Subjective:   Bruce [name] is a 72 y.o. year old very pleasant male patient who presents for/with See problem oriented charting Chief Complaint  Patient presents with   Follow-up    Pt is here to f/u on labs and cardiology visit.   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    Priority: High   Crohn's ileitis 03/17/2012    Priority: High   Emphysema lung 10/27/2019    Priority: Medium    Myalgia due to statin 10/27/2019    Priority: Medium    Aortic atherosclerosis 03/30/2017    Priority: Medium    Osteoarthritis of left knee 06/21/2016    Priority: Medium    Former smoker 12/02/2015    Priority: Medium    Iron deficiency anemia 09/03/2013    Priority: Medium    B12 deficiency anemia 09/03/2013    Priority: Medium    Hyperlipidemia 12/17/2012    Priority: Medium    Essential hypertension 03/17/2012    Priority: Medium    Sleep apnea 03/17/2012    Priority: Medium    Hypothyroidism 03/17/2012    Priority: Medium    BPH associated with nocturia 03/17/2012    Priority: Medium    Erectile dysfunction 04/01/2018    Priority: Low   GERD (gastroesophageal reflux disease) 04/01/2018    Priority: Low   Hoarseness 06/27/2017    Priority: Low   Onychomycosis 06/21/2016    Priority: Low   DDD (degenerative disc disease), 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    Priority: Low   Family history of colon cancer 01/26/2014    Priority: Low   Constipation 05/20/2012    Priority: Low   Obesity  03/17/2012    Priority: Low   Headache     Medications- reviewed and updated Current Outpatient Medications  Medication Sig Dispense Refill   aspirin 81 MG chewable tablet Chew by mouth daily.     cyclobenzaprine (FLEXERIL) 5 MG tablet Take 1 tablet (5 mg total) by mouth 2 (two) times daily as needed for muscle spasms. 10 tablet 0   Garlic 5397 MG CAPS Take by mouth.     levothyroxine (SYNTHROID) 200 MCG tablet TAKE 1 TABLET (200 MCG TOTAL) BY MOUTH DAILY BEFORE BREAKFAST. 90 tablet 3   lidocaine (LIDODERM) 5 % Place 1 patch onto the skin daily. Remove & Discard patch within 12 hours or as directed by MD 5 patch 0   losartan (COZAAR) 25 MG tablet Take 12.5 mg by mouth daily.     magnesium 30 MG tablet Take 30 mg by mouth 2 (two) times daily.     Multiple Vitamins-Minerals (ONE-A-DAY MENS HEALTH FORMULA) TABS Take by mouth.     pantoprazole (PROTONIX) 40 MG tablet TAKE 1 TABLET (40 MG TOTAL) BY MOUTH TWICE A DAY BEFORE MEALS 180 tablet 1   polyethylene glycol (MIRALAX / GLYCOLAX) 17 g packet Take 17 g by mouth daily.     potassium gluconate  595 (99 K) MG TABS tablet Take 595 mg by mouth.     ranolazine (RANEXA) 500 MG 12 hr tablet Take by mouth.     tamsulosin (FLOMAX) 0.4 MG CAPS capsule TAKE 1 CAPSULE BY MOUTH TWICE A DAY 180 capsule 1   vitamin B-12 (CYANOCOBALAMIN) 1000 MCG tablet Take 1,000 mcg by mouth daily.     carvedilol (COREG) 12.5 MG tablet Take 1 tablet (12.5 mg total) by mouth 2 (two) times daily with a meal. 180 tablet 3   No current facility-administered medications for this visit.     Objective:  BP (!) 140/80   Pulse 79   Temp 98 F (36.7 C)   Ht '5\' 11"'$  (1.803 m)   Wt 230 lb 3.2 oz (104.4 kg)   SpO2 96%   BMI 32.11 kg/m  Gen: NAD, resting comfortably CV: RRR no murmurs rubs or gallops Lungs: CTAB no crackles, wheeze, rhonchi  Ext: no edema Skin: warm, dry Neuro:  hard of hearing    Assessment and Plan   Other notes: 1.[facility] pain management- Synvisc  for knees has been completed 2.Ablation in 2 weeks for thoracic spine 3.  GI appointment this afternoon for Crohn's disease-I apologized for my tardiness but we are hoping he can make it to that appointment by 3:20 PM-we finished our visit at 3:02 PM and he will go straight there-we also called GI to let them know and were told they could potentially see him as long as he was within 10 minutes of appointment time 4.  We plan to do labs here but since he is going to the [place] location-we will do labs in the basement after GI appt to assist with him getting there on time.  5. See avs for immunization plan  #hypertension S: medication: losartan 12.5 mg, coreg 6.25 mg BID -Patient saw cardiology in November with [facility] and they added the losartan 12.5 mg but creatinine increased and TSH was elevated so they wanted to discontinue losartan and try amlodipine with repeat labs in a month.  When I looked back at his labs creatinine was 1.45 before starting losartan so we did not consider this a substantial rise we opted to continue losartan and hold off on amlodipine. Home readings #s: 130s to 150s for most part with some readings into 120s and some as high as 160s.  Heart rates off and 70s and 80s BP Readings from Last 3 Encounters:  03/15/22 (!) 140/80  12/30/21 (!) 172/95  11/23/21 132/70  A/P: Blood pressure above ideal goal-we opted to increase carvedilol to 12.5 mg twice daily and continue losartan 12.5 mg for now as long as renal function is stable-updating with labs today.  They will update me in 2 weeks and honestly I would consider increasing to 25 mg of losartan and watching renal function closely-losartan is a great choice for him long-term with his diabetes  # Diabetes S: Medication:Typically diet controlled Lab Results  Component Value Date   HGBA1C 5.8 11/23/2021   HGBA1C 6.0 05/17/2021   HGBA1C 5.8 12/29/2020  A/P: Hopefuly stable or improved- update A1c with labs today. Continue without  meds for now.    #Nonischemic cardiomyopathy- S: medication: Carvedilol 6.25 mg twice daily, losartan 12.5 mg Unintentional weight gain-none reported Edema-none A/P: Appears to be euvolemic-increase carvedilol to try to help with blood pressure as above under hypertension section.  Continue losartan for now  #hyperlipidemia with statin myalgia #Aortic atherosclerosis S: Medication:None-LDL under 70 in 2023 even without medication  Lab Results  Component Value Date   CHOL 116 11/23/2021   HDL 32.50 (L) 11/23/2021   LDLCALC 44 11/23/2021   LDLDIRECT 82.0 11/02/2016   TRIG 193.0 (H) 11/23/2021   CHOLHDL 4 11/23/2021    A/P: LDL was very well-controlled-could work on triglycerides with healthy eating/regular exercise.  Aortic atherosclerosis goal LDL under 70 and at goal for that.  Also with history of statin myalgia so cannot tolerate   #hypothyroidism S: compliant On thyroid medication-levothyroxine 200 mcg Lab Results  Component Value Date   TSH 1.98 11/23/2021  A/P: TSH was mildly elevated with cardiology-we will repeat with labs  Recommended follow up: Return for next already scheduled visit or sooner if needed. Future Appointments  Date Time Provider [place]  03/15/2022  3:20 PM Pyrtle, Lajuan Lines, MD [facility]  05/24/2022  3:00 PM Marin Olp, MD [name]    Lab/Order associations:   ICD-10-CM   1. Type II diabetes mellitus with manifestations  E11.8 Microalbumin / creatinine urine ratio    Comprehensive metabolic panel    HgB Q7Y    2. Hypothyroidism, unspecified type  E03.9 TSH    3. Nonischemic cardiomyopathy (HCC) Chronic I42.8     4. Drug-induced myopathy  G72.0     5. Essential hypertension  I10       Meds ordered this encounter  Medications   carvedilol (COREG) 12.5 MG tablet    Sig: Take 1 tablet (12.5 mg total) by mouth 2 (two) times daily with a meal.    Dispense:  180 tablet    Refill:  3    Return precautions advised.   Garret Reddish, MD

## 2022-03-15 NOTE — Patient Instructions (Addendum)
Sign release of information at the check out desk for Diabetic Eye Exam.  Shingrix at pharmacy. Prevnar 20 we can do here in march. Covid 19 at pharmacy  Increase carvedilol to 12.5 mg twice daily  Update me in 2 weeks with home readings- may need further adjustments. Considering increasing losartan honestly but want to check kidney and thyroid first.   Please go to [place] [facility] (updated 04/30/2019) - located [address] across [address] from [place] - in the basement - Hours: 7:30-5:30 PM M-F. You do NOT need an appointment.     Recommended follow up: Return for next already scheduled visit or sooner if needed.

## 2022-03-16 LAB — MICROALBUMIN / CREATININE URINE RATIO
Creatinine,U: 199.3 mg/dL
Microalb Creat Ratio: 9.9 mg/g (ref 0.0–30.0)
Microalb, Ur: 19.6 mg/dL — ABNORMAL HIGH (ref 0.0–1.9)

## 2022-03-16 LAB — COMPREHENSIVE METABOLIC PANEL
ALT: 14 U/L (ref 0–53)
AST: 21 U/L (ref 0–37)
Albumin: 3.9 g/dL (ref 3.5–5.2)
Alkaline Phosphatase: 76 U/L (ref 39–117)
BUN: 14 mg/dL (ref 6–23)
CO2: 25 mEq/L (ref 19–32)
Calcium: 8.8 mg/dL (ref 8.4–10.5)
Chloride: 104 mEq/L (ref 96–112)
Creatinine, Ser: 1.52 mg/dL — ABNORMAL HIGH (ref 0.40–1.50)
GFR: 45.4 mL/min — ABNORMAL LOW (ref >60.00)
Glucose, Bld: 91 mg/dL (ref 70–99)
Potassium: 3.8 mEq/L (ref 3.5–5.1)
Sodium: 139 mEq/L (ref 135–145)
Total Bilirubin: 0.4 mg/dL (ref 0.2–1.2)
Total Protein: 7 g/dL (ref 6.0–8.3)

## 2022-03-19 DIAGNOSIS — M47814 Spondylosis without myelopathy or radiculopathy, thoracic region: Secondary | ICD-10-CM | POA: Diagnosis not present

## 2022-03-26 ENCOUNTER — Encounter: Payer: Self-pay | Admitting: Internal Medicine

## 2022-04-02 DIAGNOSIS — M47814 Spondylosis without myelopathy or radiculopathy, thoracic region: Secondary | ICD-10-CM | POA: Diagnosis not present

## 2022-04-03 ENCOUNTER — Encounter: Payer: Self-pay | Admitting: Internal Medicine

## 2022-04-06 MED ORDER — NA SULFATE-K SULFATE-MG SULF 17.5-3.13-1.6 GM/177ML PO SOLN: ORAL | 0 refills | Status: DC

## 2022-04-16 ENCOUNTER — Encounter: Payer: Self-pay | Admitting: Family Medicine

## 2022-04-17 ENCOUNTER — Encounter: Payer: Self-pay | Admitting: Internal Medicine

## 2022-04-19 ENCOUNTER — Encounter: Payer: Medicare Other | Admitting: Internal Medicine

## 2022-04-20 ENCOUNTER — Other Ambulatory Visit: Payer: Self-pay

## 2022-04-20 ENCOUNTER — Telehealth: Payer: Self-pay | Admitting: *Deleted

## 2022-04-20 DIAGNOSIS — K5000 Crohn's disease of small intestine without complications: Secondary | ICD-10-CM

## 2022-04-20 DIAGNOSIS — Z800 Family history of malignant neoplasm of digestive organs: Secondary | ICD-10-CM

## 2022-04-20 NOTE — Telephone Encounter (Signed)
Unfortunately this was caught by [facility], need hospital based procedures Please apologize to pt for the inconvenience

## 2022-04-20 NOTE — Telephone Encounter (Signed)
Dr. Hilarie Fredrickson,   This pt is a documented difficult intubation and his procedure will need to be done at the hospital.   Thanks,  Osvaldo Angst

## 2022-04-20 NOTE — Telephone Encounter (Signed)
Pts appt rescheduled at [facility] for 04/24/22 at 8:30am, pt will need to arrive there at 7am. Left message for pt to call back.  Spoke with pts granddaughter and she is aware. Updated instructions sent to pt. [employer].

## 2022-04-23 ENCOUNTER — Encounter (HOSPITAL_COMMUNITY): Payer: Self-pay | Admitting: Internal Medicine

## 2022-04-23 NOTE — Anesthesia Preprocedure Evaluation (Signed)
Anesthesia Evaluation  Patient identified by MRN, date of birth, ID band Patient awake    Reviewed: Allergy & Precautions, NPO status , Patient's Chart, lab work & pertinent test results  History of Anesthesia Complications Negative for: history of anesthetic complications  Airway Mallampati: III  TM Distance: >3 FB Neck ROM: Full    Dental no notable dental hx. (+) Dental Advisory Given   Pulmonary sleep apnea , COPD, former smoker   Pulmonary exam normal        Cardiovascular hypertension, Pt. on home beta blockers and Pt. on medications Normal cardiovascular exam  Study Conclusions   - Left ventricle: The cavity size was mildly dilated. Wall    thickness was increased in a pattern of mild LVH. There    was focal basal hypertrophy. Systolic function was mildly    reduced. The estimated ejection fraction was in the range    of 45% to 50%. There was an increased relative    contribution of atrial contraction to ventricular filling.  - Aortic valve: Mild regurgitation.     Neuro/Psych CVA    GI/Hepatic Neg liver ROS,GERD  ,,Crohn's Ds   Endo/Other  diabetesHypothyroidism    Renal/GU negative Renal ROS     Musculoskeletal negative musculoskeletal ROS (+)    Abdominal   Peds  Hematology negative hematology ROS (+)   Anesthesia Other Findings   Reproductive/Obstetrics                             Anesthesia Physical Anesthesia Plan  ASA: 3  Anesthesia Plan: MAC   Post-op Pain Management: Minimal or no pain anticipated   Induction:   PONV Risk Score and Plan: 1 and Propofol infusion  Airway Management Planned: Natural Airway  Additional Equipment:   Intra-op Plan:   Post-operative Plan:   Informed Consent: I have reviewed the patients History and Physical, chart, labs and discussed the procedure including the risks, benefits and alternatives for the proposed anesthesia with  the patient or authorized representative who has indicated his/her understanding and acceptance.     Dental advisory given  Plan Discussed with: Anesthesiologist and CRNA  Anesthesia Plan Comments:        Anesthesia Quick Evaluation

## 2022-04-24 ENCOUNTER — Other Ambulatory Visit: Payer: Self-pay

## 2022-04-24 ENCOUNTER — Ambulatory Visit (HOSPITAL_COMMUNITY): Payer: Medicare Other | Admitting: Anesthesiology

## 2022-04-24 ENCOUNTER — Encounter (HOSPITAL_COMMUNITY)
Admission: RE | Disposition: A | Discharge status: Home / Self Care | Payer: Self-pay | Source: Home / Self Care | Attending: Internal Medicine

## 2022-04-24 ENCOUNTER — Ambulatory Visit (HOSPITAL_BASED_OUTPATIENT_CLINIC_OR_DEPARTMENT_OTHER): Payer: Medicare Other | Admitting: Anesthesiology

## 2022-04-24 ENCOUNTER — Encounter (HOSPITAL_COMMUNITY): Payer: Self-pay | Admitting: Internal Medicine

## 2022-04-24 ENCOUNTER — Ambulatory Visit (HOSPITAL_COMMUNITY)
Admission: RE | Admit: 2022-04-24 | Discharge: 2022-04-24 | Disposition: A | Discharge status: Home / Self Care | Payer: Medicare Other | Attending: Internal Medicine | Admitting: Internal Medicine

## 2022-04-24 ENCOUNTER — Encounter: Payer: Medicare Other | Admitting: Internal Medicine

## 2022-04-24 DIAGNOSIS — K648 Other hemorrhoids: Secondary | ICD-10-CM | POA: Insufficient documentation

## 2022-04-24 DIAGNOSIS — Z1211 Encounter for screening for malignant neoplasm of colon: Secondary | ICD-10-CM

## 2022-04-24 DIAGNOSIS — J449 Chronic obstructive pulmonary disease, unspecified: Secondary | ICD-10-CM | POA: Diagnosis not present

## 2022-04-24 DIAGNOSIS — K624 Stenosis of anus and rectum: Secondary | ICD-10-CM | POA: Insufficient documentation

## 2022-04-24 DIAGNOSIS — Z800 Family history of malignant neoplasm of digestive organs: Secondary | ICD-10-CM

## 2022-04-24 DIAGNOSIS — Z980 Intestinal bypass and anastomosis status: Secondary | ICD-10-CM | POA: Diagnosis not present

## 2022-04-24 DIAGNOSIS — K5730 Diverticulosis of large intestine without perforation or abscess without bleeding: Secondary | ICD-10-CM | POA: Diagnosis not present

## 2022-04-24 DIAGNOSIS — Z9049 Acquired absence of other specified parts of digestive tract: Secondary | ICD-10-CM | POA: Diagnosis not present

## 2022-04-24 DIAGNOSIS — Z87891 Personal history of nicotine dependence: Secondary | ICD-10-CM

## 2022-04-24 DIAGNOSIS — K5000 Crohn's disease of small intestine without complications: Secondary | ICD-10-CM | POA: Diagnosis not present

## 2022-04-24 DIAGNOSIS — I10 Essential (primary) hypertension: Secondary | ICD-10-CM

## 2022-04-24 DIAGNOSIS — E039 Hypothyroidism, unspecified: Secondary | ICD-10-CM | POA: Diagnosis not present

## 2022-04-24 HISTORY — PX: COLONOSCOPY: SHX5424

## 2022-04-24 HISTORY — PX: BIOPSY: SHX5522

## 2022-04-24 SURGERY — COLONOSCOPY
Anesthesia: Monitor Anesthesia Care

## 2022-04-24 MED ORDER — SODIUM CHLORIDE 0.9 % IV SOLN: INTRAVENOUS | Status: DC

## 2022-04-24 MED ORDER — PROPOFOL 10 MG/ML IV BOLUS
INTRAVENOUS | Status: DC | PRN
  Administered 2022-04-24: 20 mg via INTRAVENOUS
  Administered 2022-04-24: 10 mg via INTRAVENOUS

## 2022-04-24 MED ORDER — PROPOFOL 500 MG/50ML IV EMUL
INTRAVENOUS | Status: DC | PRN
  Administered 2022-04-24: 125 ug/kg/min via INTRAVENOUS

## 2022-04-24 MED ORDER — LACTATED RINGERS IV SOLN
INTRAVENOUS | Status: DC
  Administered 2022-04-24: 1000 mL via INTRAVENOUS

## 2022-04-24 MED ORDER — PROPOFOL 1000 MG/100ML IV EMUL
INTRAVENOUS | Status: AC
  Filled 2022-04-24: qty 100

## 2022-04-24 MED ORDER — PROPOFOL 500 MG/50ML IV EMUL
INTRAVENOUS | Status: AC
  Filled 2022-04-24: qty 50

## 2022-04-24 NOTE — Transfer of Care (Signed)
Immediate Anesthesia Transfer of Care Note  Patient: Bruce [name]  Procedure(s) Performed: COLONOSCOPY BIOPSY  Patient Location: PACU  Anesthesia Type:MAC  Level of Consciousness: drowsy  Airway & Oxygen Therapy: Patient Spontanous Breathing and Patient connected to face mask oxygen  Post-op Assessment: Report given to RN, Post -op Vital signs reviewed and stable, and Patient moving all extremities X 4  Post vital signs: Reviewed and stable  Last Vitals:  Vitals Value Taken Time  BP 111/69   Temp    Pulse 82   Resp 12   SpO2 100     Last Pain:  Vitals:   04/24/22 0841  TempSrc:   PainSc: 0-No pain         Complications: No notable events documented.

## 2022-04-24 NOTE — Op Note (Signed)
[facility] Patient Name: Bruce [name] Procedure Date: 04/24/2022 MRN: [id number] Attending MD: Jerene Bears , MD, [id number] Date of Birth: 1950-11-03 CSN: [id number] Age: 72 Admit Type: Outpatient Procedure:                Colonoscopy Indications:              Screening in patient at increased risk: Family                            history of 1st-degree relative with colorectal                            cancer, Last colonoscopy: January 2019; personal                            history of ileal Crohn's disease with prior ileal                            resection with ileocolonic anastomosis Providers:                Lajuan Lines. Hilarie Fredrickson, MD, Jobe Igo, RN, Cherylynn Ridges, Technician, Maudry Diego, CRNA Referring MD:             Brayton Mars. Hunter MD Medicines:                Propofol per Anesthesia Complications:            No immediate complications. Estimated Blood Loss:     Estimated blood loss was minimal. Procedure:                Pre-Anesthesia Assessment:                           - Prior to the procedure, a History and Physical                            was performed, and patient medications and                            allergies were reviewed. The patient's tolerance of                            previous anesthesia was also reviewed. The risks                            and benefits of the procedure and the sedation                            options and risks were discussed with the patient.                            All questions were answered, and informed consent  was obtained. Prior Anticoagulants: The patient has                            taken no anticoagulant or antiplatelet agents. ASA                            Grade Assessment: III - A patient with severe                            systemic disease. After reviewing the risks and                            benefits, the patient was  deemed in satisfactory                            condition to undergo the procedure.                           After obtaining informed consent, the colonoscope                            was passed under direct vision. Throughout the                            procedure, the patient's blood pressure, pulse, and                            oxygen saturations were monitored continuously. The                            [id number]) Olympus colonoscope was                            introduced through the anus and advanced to the                            ileocolonic anastomosis. The colonoscopy was                            performed without difficulty. The patient tolerated                            the procedure well. The quality of the bowel                            preparation was good. The ileocecal valve,                            appendiceal orifice, and rectum were photographed. Scope In: 8:50:25 AM Scope Out: 9:15:10 AM Scope Withdrawal Time: 0 hours 20 minutes 19 seconds  Total Procedure Duration: 0 hours 24 minutes 45 seconds  Findings:      The digital rectal exam findings include anal stricture. Query previous       perianal involvement with Crohn's though no inflammation  seen today.      There was evidence of a prior end-to-side ileo-colonic anastomosis in       the ascending colon (cecum sparing). This was characterized by       inflammation. The anastomosis could not be traversed with stricturing       present at immediate anastomosis.      Localized inflammation characterized by friability, granularity,       scarring and shallow ulcerations was found in the neo-terminal Ileum and       in the surgical anastomosis. The inflammation was moderate in severity.       Biopsies were taken with a cold forceps for histology. Ileum could not       be fully intubated but the inflammation appears confined to the       immediate distal neo-TI and anastomosis.       Scattered small-mouthed diverticula were found in the sigmoid colon.      Internal hemorrhoids were found during retroflexion. The hemorrhoids       were small.      The exam was otherwise without abnormality. No colitis or colonic polyps       seen. 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                            Query previous perianal Crohn's (adult scope                            passes).                           - End-to-side ileo-colonic anastomosis,                            characterized by inflammation.                           - Crohn's disease with ileitis. Inflammation was                            found. This was moderate in severity. Biopsied.                            Disease activity appears confined to the immediate                            distal neo-TI and anastomosis.                           - Mild diverticulosis in the sigmoid colon.                           - Internal hemorrhoids.                           - The examination was otherwise normal. No colitis  or colonic polyps seen. Moderate Sedation:      N/A Recommendation:           - Patient has a contact number available for                            emergencies. The signs and symptoms of potential                            delayed complications were discussed with the                            patient. Return to normal activities tomorrow.                            Written discharge instructions were provided to the                            patient.                           - Resume previous diet.                           - Continue present medications. See office note,                            but Crohn's therapy deferred at this time given                            limited extent of disease, relative risk of                            therapy, and patient preference. However, if                            partial obstructive  symptoms occur surgical                            revision of anastomosis and further discussion                            about medical management would be recommended.                           - Await pathology results.                           - Repeat colonoscopy in 5 years for screening                            purposes. Procedure Code(s):        --- Professional ---                           [phone], Colonoscopy, flexible; with biopsy, single  or multiple Diagnosis Code(s):        --- Professional ---                           Z80.0, Family history of malignant neoplasm of                            digestive organs                           K62.4, Stenosis of anus and rectum                           Z98.0, Intestinal bypass and anastomosis status                           K50.00, Crohn's disease of small intestine without                            complications                           K64.8, Other hemorrhoids                           K57.30, Diverticulosis of large intestine without                            perforation or abscess without bleeding CPT copyright 2022 American [employer] Association. All rights reserved. The codes documented in this report are preliminary and upon coder review may  be revised to meet current compliance requirements. Jerene Bears, MD 04/24/2022 9:28:11 AM This report has been signed electronically. Number of Addenda: 0

## 2022-04-24 NOTE — Anesthesia Postprocedure Evaluation (Signed)
Anesthesia Post Note  Patient: Bruce [name]  Procedure(s) Performed: COLONOSCOPY BIOPSY     Patient location during evaluation: Endoscopy Anesthesia Type: MAC Level of consciousness: awake and alert Pain management: pain level controlled Vital Signs Assessment: post-procedure vital signs reviewed and stable Respiratory status: spontaneous breathing and respiratory function stable Cardiovascular status: stable Postop Assessment: no apparent nausea or vomiting Anesthetic complications: no  No notable events documented.  Last Vitals:  Vitals:   04/24/22 0920 04/24/22 0930  BP: 108/78 127/69  Pulse: 81 83  Resp: 16 12  Temp:    SpO2: 100% 98%    Last Pain:  Vitals:   04/24/22 0930  TempSrc:   PainSc: 0-No pain                 Branden Vine DANIEL

## 2022-04-24 NOTE — Anesthesia Procedure Notes (Signed)
Procedure Name: MAC Date/Time: 04/24/2022 8:41 AM  Performed by: Niel Hummer, CRNAPre-anesthesia Checklist: Patient identified, Emergency Drugs available, Suction available and Patient being monitored Oxygen Delivery Method: Simple face mask
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
## 2022-04-24 NOTE — Discharge Instructions (Signed)
YOU HAD AN ENDOSCOPIC PROCEDURE TODAY: Refer to the procedure report and other information in the discharge instructions given to you for any specific questions about what was found during the examination. If this information does not answer your questions, please call [place] office at [phone] to clarify.  ° °YOU SHOULD EXPECT: Some feelings of bloating in the abdomen. Passage of more gas than usual. Walking can help get rid of the air that was put into your GI tract during the procedure and reduce the bloating. If you had a lower endoscopy (such as a colonoscopy or flexible sigmoidoscopy) you may notice spotting of blood in your stool or on the toilet paper. Some abdominal soreness may be present for a day or two, also. ° °DIET: Your first meal following the procedure should be a light meal and then it is ok to progress to your normal diet. A half-sandwich or bowl of soup is an example of a good first meal. Heavy or fried foods are harder to digest and may make you feel nauseous or bloated. Drink plenty of fluids but you should avoid alcoholic beverages for 24 hours. If you had a esophageal dilation, please see attached instructions for diet.   ° °ACTIVITY: Your care partner should take you home directly after the procedure. You should plan to take it easy, moving slowly for the rest of the day. You can resume normal activity the day after the procedure however YOU SHOULD NOT DRIVE, use power tools, machinery or perform tasks that involve climbing or major physical exertion for 24 hours (because of the sedation medicines used during the test).  ° °SYMPTOMS TO REPORT IMMEDIATELY: °A gastroenterologist can be reached at any hour. Please call [phone]  for any of the following symptoms:  °Following lower endoscopy (colonoscopy, flexible sigmoidoscopy) °Excessive amounts of blood in the stool  °Significant tenderness, worsening of abdominal pains  °Swelling of the abdomen that is new, acute  °Fever of 100° or  higher  °Following upper endoscopy (EGD, EUS, ERCP, esophageal dilation) °Vomiting of blood or coffee ground material  °New, significant abdominal pain  °New, significant chest pain or pain under the shoulder blades  °Painful or persistently difficult swallowing  °New shortness of breath  °Black, tarry-looking or red, bloody stools ° °FOLLOW UP:  °If any biopsies were taken you will be contacted by phone or by letter within the next 1-3 weeks. Call [phone]  if you have not heard about the biopsies in 3 weeks.  °Please also call with any specific questions about appointments or follow up tests. ° °

## 2022-04-25 LAB — SURGICAL PATHOLOGY

## 2022-04-26 ENCOUNTER — Encounter: Payer: Self-pay | Admitting: Internal Medicine

## 2022-04-27 ENCOUNTER — Encounter (HOSPITAL_COMMUNITY): Payer: Self-pay | Admitting: Internal Medicine

## 2022-05-24 ENCOUNTER — Encounter: Payer: Self-pay | Admitting: Family Medicine

## 2022-05-24 ENCOUNTER — Ambulatory Visit (INDEPENDENT_AMBULATORY_CARE_PROVIDER_SITE_OTHER): Payer: Medicare Other | Admitting: Family Medicine

## 2022-05-24 VITALS — BP 126/74 | HR 74 | Temp 97.3°F | Ht 71.0 in | Wt 234.8 lb

## 2022-05-24 DIAGNOSIS — R351 Nocturia: Secondary | ICD-10-CM

## 2022-05-24 DIAGNOSIS — I700 Atherosclerosis of aorta: Secondary | ICD-10-CM

## 2022-05-24 DIAGNOSIS — N401 Enlarged prostate with lower urinary tract symptoms: Secondary | ICD-10-CM

## 2022-05-24 DIAGNOSIS — G720 Drug-induced myopathy: Secondary | ICD-10-CM | POA: Diagnosis not present

## 2022-05-24 DIAGNOSIS — J439 Emphysema, unspecified: Secondary | ICD-10-CM | POA: Diagnosis not present

## 2022-05-24 DIAGNOSIS — Z0000 Encounter for general adult medical examination without abnormal findings: Secondary | ICD-10-CM

## 2022-05-24 DIAGNOSIS — F01A0 Vascular dementia, mild, without behavioral disturbance, psychotic disturbance, mood disturbance, and anxiety: Secondary | ICD-10-CM | POA: Diagnosis not present

## 2022-05-24 DIAGNOSIS — B351 Tinea unguium: Secondary | ICD-10-CM
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
## 2022-05-24 NOTE — Patient Instructions (Addendum)
Sign release of information at the check out desk for Diabetic eye exam.  Take carvedilol twice a day full tablet of 12.5 mg  Recommended follow up: Return in about 4 months (around 09/23/2022) for followup or sooner if needed.Schedule b4 you leave.

## 2022-06-22 ENCOUNTER — Encounter: Payer: Self-pay | Admitting: Family Medicine

## 2022-06-22 ENCOUNTER — Ambulatory Visit (INDEPENDENT_AMBULATORY_CARE_PROVIDER_SITE_OTHER): Payer: Medicare Other | Admitting: Family Medicine

## 2022-06-22 VITALS — BP 120/70 | HR 90 | Temp 98.1°F | Ht 71.0 in | Wt 231.2 lb

## 2022-06-22 DIAGNOSIS — J069 Acute upper respiratory infection, unspecified: Secondary | ICD-10-CM

## 2022-06-22 DIAGNOSIS — R0982 Postnasal drip: Secondary | ICD-10-CM | POA: Diagnosis not present

## 2022-06-22 NOTE — Progress Notes (Signed)
Established Patient Office Visit   Subjective  Patient ID: Bruce [name], male    DOB: January 04, 1951  Age: 72 y.o. MRN: [id number]  Chief Complaint  Patient presents with   Cough    Patient complains of cough, x1 week, Tried Nyquil, Dayquil and Mucinex    Nasal Congestion    Patient complains of nasal congestion, Tried Nyquil, Dayquil and Mucinex   Pt accompanied by his daughter.  Patient is a 72 year old male with pmh sig for emphysema, former smoker, mild dementia, TBI, history of DVT s/p JJ COVID vaccine,NCIM, decreased hearing DM2, Crohn's disease, HLD, HTN, hypothyroidism who is followed by Dr. Durene Cal and seen for acute concern.  Patient endorses productive cough and nasal congestion x 1 week.  Endorses slight soreness to throat with coughing.  Cough worse at night with some SOB.  Taking Mucinex, DayQuil, NyQuil causing increased GI symptoms due to Crohn's disease.  Also tried Vicks vapor rub.  Patient denies sick contacts, headache, fever, facial pain/pressure, ear pain/pressure, nausea, vomiting, history of seasonal allergies.  Patient started endorses hearing wheezing last night.  At home COVID test negative prior to visit.  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   Headache    Lacunar infarction    Emphysema lung 10/27/2019   Myalgia due to statin 10/27/2019   History of DVT (deep vein thrombosis) after J+J covid vaccine  06/26/2019   Nonischemic cardiomyopathy 04/01/2018   Erectile dysfunction 04/01/2018   GERD (gastroesophageal reflux disease) 04/01/2018   Hoarseness 06/27/2017   Aortic atherosclerosis 03/30/2017   Osteoarthritis of left knee 06/21/2016   Onychomycosis 06/21/2016   Former smoker 12/02/2015   DDD (degenerative disc disease), 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    Family history of colon cancer 01/26/2014   Iron deficiency anemia 09/03/2013   B12 deficiency anemia 09/03/2013   Type II diabetes mellitus with manifestations 08/17/2013   Hyperlipidemia 12/17/2012   Constipation 05/20/2012   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   Essential hypertension 03/17/2012   Sleep apnea 03/17/2012   Hypothyroidism 03/17/2012   Crohn's ileitis 03/17/2012   Obesity 03/17/2012   BPH associated with nocturia 03/17/2012   Past Surgical History:  Procedure Laterality Date   BIOPSY  04/24/2022   Procedure: BIOPSY;  Surgeon: Beverley Fiedler, MD;  Location: [facility];  Service: Gastroenterology;;   BOWEL RESECTION N/A 08/11/2013   x2- one at [facility] [facility] in 2015   CHOLECYSTECTOMY     COLONOSCOPY  01/19/2011   Procedure: COLONOSCOPY;  Surgeon: Malissa Hippo, MD;  [facility];  Service: Endoscopy;  Laterality: N/A;  9:30 am   COLONOSCOPY N/A 04/24/2022   Procedure: COLONOSCOPY;  Surgeon: Beverley Fiedler, MD;  Location: [facility];  Service: Gastroenterology;  Laterality: N/A;   INCISION AND DRAINAGE PERIRECTAL ABSCESS N/A 09/08/2013   Procedure: IRRIGATION AND DEBRIDEMENT PERIRECTAL ABSCESS;  Surgeon: Wilmon Arms. Corliss Skains, MD;  Location: [facility];  Service: General;  Laterality: N/A;   LAPAROTOMY N/A 08/11/2013   Procedure: EXPLORATORY LAPAROTOMY ;  Surgeon: Clovis Pu. Cornett, MD;  Location: [facility];  Service: General;  Laterality: N/A;   NASAL SINUS SURGERY     seventh nerve face     spleenectomy     Punctured after a fall in 1963   TONSILLECTOMY AND ADENOIDECTOMY  Social History   Tobacco Use   Smoking status: Former    Packs/day: 4.00    Years: 40.00    Additional pack years: 0.00    Total pack years: 160.00    Types: Cigarettes    Quit date: 12/24/2005    Years since quitting: 16.5   Smokeless tobacco: Never  Vaping Use   Vaping Use: Never used  Substance Use Topics   Alcohol use: No   Drug use: No   Family History  Problem  Relation Age of Onset   Colon cancer Mother    Uterine cancer Mother    Alzheimer's disease Mother    Colon cancer Sister    Healthy Sister    Healthy Sister    Crohn's disease Daughter    Arthritis Daughter    Healthy Son    Brain cancer Father    Other Daughter        accidental death   Heart disease Maternal Grandmother    Alzheimer's disease Maternal Grandmother    Bladder Cancer Paternal Grandfather    Alzheimer's disease Paternal Grandfather    Irritable bowel syndrome Sister    Alzheimer's disease Maternal Grandfather    Alzheimer's disease Paternal Grandmother    Allergies  Allergen Reactions   Lisinopril Cough   Coconut Flavor Other (See Comments)    Does not eat because of crohn's    Benzodiazepines Other (See Comments)    Per daughter, benzo's make dementia worse and really hard to come down off these, not a true allergy but would rather not take them if avoidable.       Review of Systems  Respiratory:  Positive for cough.    Negative unless stated above    Objective:     BP 120/70 (BP Location: Left Arm, Patient Position: Sitting, Cuff Size: Normal)   Pulse 90   Temp 98.1 F (36.7 C) (Oral)   Ht  (1.803 m)   Wt 231 lb 3.2 oz (104.9 kg)   SpO2 97%   BMI 32.25 kg/m    Physical Exam Constitutional:      Appearance: Normal appearance.     Comments: A spot of dried blood on L upper pant leg.  HENT:     Head: Normocephalic and atraumatic.     Right Ear: Tympanic membrane normal. Decreased hearing noted.     Left Ear: Tympanic membrane normal.     Nose: Congestion present.     Right Sinus: No maxillary sinus tenderness or frontal sinus tenderness.     Left Sinus: No maxillary sinus tenderness or frontal sinus tenderness.     Comments: Dried nasal drainage inside of nares bilaterally.    Mouth/Throat:     Mouth: Mucous membranes are moist.     Tongue: Tongue deviates from midline.     Pharynx: Posterior oropharyngeal erythema present.      Comments: Postnasal drainage noted Neurological:     Mental Status: He is alert.      No results found for any visits on 06/22/22.    Assessment & Plan:  Viral URI with cough  Post-nasal drainage  Advised symptoms likely 2/2 viral etiology.  Lungs clear on exam.  Postnasal drainage contributing to increased coughing.  Consider OTC Allegra for drainage.  Continue other supportive care.  Given strict precautions for continued or worsening symptoms.  Return if symptoms worsen or fail to improve.   Deeann Saint, MD

## 2022-06-22 NOTE — Addendum Note (Signed)
Addended by: Christy Sartorius on: 06/22/2022 04:51 PM   Modules accepted: Orders

## 2022-06-28 ENCOUNTER — Encounter: Payer: Self-pay | Admitting: Family Medicine

## 2022-06-28 ENCOUNTER — Ambulatory Visit (INDEPENDENT_AMBULATORY_CARE_PROVIDER_SITE_OTHER): Payer: Medicare Other | Admitting: Family

## 2022-06-28 VITALS — BP 128/78 | HR 80 | Ht 71.0 in | Wt 238.0 lb

## 2022-06-28 DIAGNOSIS — R053 Chronic cough: Secondary | ICD-10-CM

## 2022-06-28 MED ORDER — PREDNISONE 20 MG PO TABS: ORAL_TABLET | ORAL | 0 refills | Status: DC
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
## 2022-07-04 DIAGNOSIS — G4733 Obstructive sleep apnea (adult) (pediatric): Secondary | ICD-10-CM | POA: Diagnosis not present

## 2022-07-04 DIAGNOSIS — I428 Other cardiomyopathies: Secondary | ICD-10-CM | POA: Diagnosis not present

## 2022-07-04 DIAGNOSIS — E7800 Pure hypercholesterolemia, unspecified: Secondary | ICD-10-CM | POA: Diagnosis not present

## 2022-07-04 DIAGNOSIS — R0609 Other forms of dyspnea: Secondary | ICD-10-CM | POA: Diagnosis not present

## 2022-07-04 DIAGNOSIS — I2510 Atherosclerotic heart disease of native coronary artery without angina pectoris: Secondary | ICD-10-CM | POA: Diagnosis not present

## 2022-07-04 DIAGNOSIS — I2584 Coronary atherosclerosis due to calcified coronary lesion: Secondary | ICD-10-CM | POA: Diagnosis not present

## 2022-07-04 DIAGNOSIS — I10 Essential (primary) hypertension: Secondary | ICD-10-CM | POA: Diagnosis not present

## 2022-07-17 DIAGNOSIS — M79676 Pain in unspecified toe(s): Secondary | ICD-10-CM | POA: Diagnosis not present

## 2022-07-17 DIAGNOSIS — B351 Tinea unguium: Secondary | ICD-10-CM | POA: Diagnosis not present

## 2022-07-29 ENCOUNTER — Other Ambulatory Visit: Payer: Self-pay | Admitting: Urology

## 2022-07-31 ENCOUNTER — Encounter: Payer: Self-pay | Admitting: Family Medicine

## 2022-07-31 ENCOUNTER — Other Ambulatory Visit: Payer: Self-pay

## 2022-07-31 DIAGNOSIS — R0609 Other forms of dyspnea: Secondary | ICD-10-CM | POA: Diagnosis not present

## 2022-07-31 DIAGNOSIS — I10 Essential (primary) hypertension: Secondary | ICD-10-CM | POA: Diagnosis not present

## 2022-07-31 DIAGNOSIS — G4733 Obstructive sleep apnea (adult) (pediatric): Secondary | ICD-10-CM | POA: Diagnosis not present

## 2022-07-31 MED ORDER — LOSARTAN POTASSIUM 25 MG PO TABS: 25.0000 mg | ORAL_TABLET | Freq: Every morning | ORAL | 3 refills | Status: DC

## 2022-08-02 DIAGNOSIS — M79676 Pain in unspecified toe(s): Secondary | ICD-10-CM | POA: Diagnosis not present

## 2022-08-02 DIAGNOSIS — L03031 Cellulitis of right toe: Secondary | ICD-10-CM | POA: Diagnosis not present

## 2022-08-02 DIAGNOSIS — B351 Tinea unguium: Secondary | ICD-10-CM | POA: Diagnosis not present

## 2022-08-04 ENCOUNTER — Emergency Department (HOSPITAL_BASED_OUTPATIENT_CLINIC_OR_DEPARTMENT_OTHER)
Admission: EM | Admit: 2022-08-04 | Discharge: 2022-08-04 | Disposition: A | Discharge status: Home / Self Care | Payer: Medicare Other | Attending: Emergency Medicine | Admitting: Emergency Medicine

## 2022-08-04 ENCOUNTER — Emergency Department (HOSPITAL_BASED_OUTPATIENT_CLINIC_OR_DEPARTMENT_OTHER): Payer: Medicare Other

## 2022-08-04 ENCOUNTER — Other Ambulatory Visit: Payer: Self-pay

## 2022-08-04 ENCOUNTER — Encounter (HOSPITAL_BASED_OUTPATIENT_CLINIC_OR_DEPARTMENT_OTHER): Payer: Self-pay | Admitting: Emergency Medicine

## 2022-08-04 DIAGNOSIS — E119 Type 2 diabetes mellitus without complications: Secondary | ICD-10-CM | POA: Insufficient documentation

## 2022-08-04 DIAGNOSIS — R109 Unspecified abdominal pain: Secondary | ICD-10-CM | POA: Diagnosis not present

## 2022-08-04 DIAGNOSIS — I10 Essential (primary) hypertension: Secondary | ICD-10-CM | POA: Insufficient documentation

## 2022-08-04 DIAGNOSIS — Z79899 Other long term (current) drug therapy: Secondary | ICD-10-CM | POA: Diagnosis not present

## 2022-08-04 DIAGNOSIS — Z85038 Personal history of other malignant neoplasm of large intestine: Secondary | ICD-10-CM | POA: Diagnosis not present

## 2022-08-04 DIAGNOSIS — N281 Cyst of kidney, acquired: Secondary | ICD-10-CM | POA: Diagnosis not present

## 2022-08-04 DIAGNOSIS — Z7982 Long term (current) use of aspirin: Secondary | ICD-10-CM | POA: Diagnosis not present

## 2022-08-04 LAB — COMPREHENSIVE METABOLIC PANEL
ALT: 19 U/L (ref 0–44)
AST: 24 U/L (ref 15–41)
Albumin: 3.7 g/dL (ref 3.5–5.0)
Alkaline Phosphatase: 72 U/L (ref 38–126)
Anion gap: 9 (ref 5–15)
BUN: 21 mg/dL (ref 8–23)
CO2: 23 mmol/L (ref 22–32)
Calcium: 8.9 mg/dL (ref 8.9–10.3)
Chloride: 103 mmol/L (ref 98–111)
Creatinine, Ser: 1.48 mg/dL — ABNORMAL HIGH (ref 0.61–1.24)
GFR, Estimated: 50 mL/min — ABNORMAL LOW (ref >60)
Glucose, Bld: 97 mg/dL (ref 70–99)
Potassium: 4.1 mmol/L (ref 3.5–5.1)
Sodium: 135 mmol/L (ref 135–145)
Total Bilirubin: 0.7 mg/dL (ref 0.3–1.2)
Total Protein: 7.6 g/dL (ref 6.5–8.1)

## 2022-08-04 LAB — CBC WITH DIFFERENTIAL/PLATELET
Abs Immature Granulocytes: 0.03 10*3/uL (ref 0.00–0.07)
Basophils Absolute: 0.1 10*3/uL (ref 0.0–0.1)
Basophils Relative: 1 %
Eosinophils Absolute: 0.5 10*3/uL (ref 0.0–0.5)
Eosinophils Relative: 5 %
HCT: 40.7 % (ref 39.0–52.0)
Hemoglobin: 13.5 g/dL (ref 13.0–17.0)
Immature Granulocytes: 0 %
Lymphocytes Relative: 26 %
Lymphs Abs: 2.6 10*3/uL (ref 0.7–4.0)
MCH: 29.6 pg (ref 26.0–34.0)
MCHC: 33.2 g/dL (ref 30.0–36.0)
MCV: 89.3 fL (ref 80.0–100.0)
Monocytes Absolute: 0.9 10*3/uL (ref 0.1–1.0)
Monocytes Relative: 9 %
Neutro Abs: 5.9 10*3/uL (ref 1.7–7.7)
Neutrophils Relative %: 59 %
Platelets: 476 10*3/uL — ABNORMAL HIGH (ref 150–400)
RBC: 4.56 MIL/uL (ref 4.22–5.81)
RDW: 15.1 % (ref 11.5–15.5)
WBC: 10 10*3/uL (ref 4.0–10.5)
nRBC: 0 % (ref 0.0–0.2)

## 2022-08-04 LAB — URINALYSIS, ROUTINE W REFLEX MICROSCOPIC
Bilirubin Urine: NEGATIVE
Glucose, UA: NEGATIVE mg/dL
Hgb urine dipstick: NEGATIVE
Ketones, ur: NEGATIVE mg/dL
Nitrite: NEGATIVE
Protein, ur: NEGATIVE mg/dL
Specific Gravity, Urine: 1.01 (ref 1.005–1.030)
pH: 5.5 (ref 5.0–8.0)

## 2022-08-04 LAB — URINALYSIS, MICROSCOPIC (REFLEX)
Bacteria, UA: NONE SEEN
RBC / HPF: NONE SEEN RBC/hpf (ref 0–5)

## 2022-08-04 LAB — LIPASE, BLOOD: Lipase: 60 U/L — ABNORMAL HIGH (ref 11–51)

## 2022-08-04 MED ORDER — ACETAMINOPHEN 500 MG PO TABS: 500.0000 mg | ORAL_TABLET | Freq: Four times a day (QID) | ORAL | 0 refills | Status: AC | PRN

## 2022-08-04 MED ORDER — CYCLOBENZAPRINE HCL 10 MG PO TABS: 10.0000 mg | ORAL_TABLET | Freq: Two times a day (BID) | ORAL | 0 refills | Status: DC | PRN

## 2022-08-04 NOTE — Discharge Instructions (Signed)
Your pain is likely due to a muscle strain.  You may take Tylenol and muscle relaxant as needed.  Follow-up closely with your doctor for further care.  Return if you have any concern.

## 2022-08-04 NOTE — ED Triage Notes (Addendum)
Pt c/o RT flank pain x 2-3 days; denies urinary sxs; pain worse w/ movement, but denies injury

## 2022-08-04 NOTE — ED Notes (Signed)
D/c paperwork reviewed with pt, including prescriptions.  No questions or concerns voiced at time of d/c. . Pt verbalized understanding, Ambulatory with family to ED exit, NAD.   

## 2022-08-04 NOTE — ED Provider Notes (Signed)
EMERGENCY DEPARTMENT AT [facility] Provider Note   CSN: [id number] Arrival date & time: 08/04/22  1551     History  Chief Complaint  Patient presents with   Flank Pain    Bruce [name] is a 72 y.o. male.  The history is provided by the patient, medical records and a relative. No language interpreter was used.  Flank Pain     72 year old male with significant history of Crohn's disease, constipations, diabetes, diverticulosis and family history of colon cancer who presents with complaint of flank pain.  Patient report for the past 2 days he has had intermittent pain primarily on his right flank.  Pain is noticeable with any kind of movement and seems to resolved at rest.  Pain is nonradiating without any associated lightheadedness, dizziness, nausea, vomiting, diarrhea, constipation, urinary discomfort, hematuria, or rash.  He has been eating and drinking fine and having normal bowel movement.  He denies any specific treatment tried but daughter states he has been taking Tylenol without relief.  At this time he rates his pain as 1 out of 10.  Patient is hard of hearing from his right ear.  He has had remote history of kidney stones.  No report of any strenuous activities or heavy lifting.  Home Medications Prior to Admission medications   Medication Sig Start Date End Date Taking? Authorizing Provider  aspirin 81 MG chewable tablet Chew 81 mg by mouth daily with supper.    [provider]  carvedilol (COREG) 12.5 MG tablet Take 1 tablet (12.5 mg total) by mouth 2 (two) times daily with a meal. Patient taking differently: Take 25 mg by mouth 2 (two) times daily with a meal. 03/15/22   Shelva Majestic, MD  Cyanocobalamin (VITAMIN B-12 PO) Take 1 tablet by mouth daily with supper.    [provider]  cyclobenzaprine (FLEXERIL) 5 MG tablet Take 1 tablet (5 mg total) by mouth 2 (two) times daily as needed for muscle spasms. 12/30/21   Pricilla Loveless, MD  GARLIC PO Take 1 capsule by mouth with breakfast, with lunch, and with evening meal.    [provider]  levothyroxine (SYNTHROID) 200 MCG tablet TAKE 1 TABLET (200 MCG TOTAL) BY MOUTH DAILY BEFORE BREAKFAST. 12/11/21   Shelva Majestic, MD  losartan (COZAAR) 25 MG tablet Take 1 tablet (25 mg total) by mouth in the morning. 07/31/22   Shelva Majestic, MD  MAGNESIUM PO Take 1 tablet by mouth 2 (two) times daily.    [provider]  Multiple Vitamins-Minerals (ONE-A-DAY MENS HEALTH FORMULA) TABS Take by mouth.    [provider]  pantoprazole (PROTONIX) 40 MG tablet Take 1 tablet (40 mg total) by mouth daily. Patient taking differently: Take 40 mg by mouth daily after lunch. 03/15/22   Pyrtle, Carie Caddy, MD  Polyethyl Glycol-Propyl Glycol (SYSTANE) 0.4-0.3 % SOLN Place 1-2 drops into both eyes 3 (three) times daily as needed (dry/irritated eyes.).    [provider]  polyethylene glycol (MIRALAX / GLYCOLAX) 17 g packet Take 17 g by mouth daily.    [provider]  potassium gluconate 595 (99 K) MG TABS tablet Take 595 mg by mouth daily with supper.    [provider]  predniSONE (DELTASONE) 20 MG tablet Take 2 pills in the morning with breakfast for 3 days, then 1 pill for 2 days 06/29/22   Dulce Sellar, NP  ranolazine (RANEXA) 500 MG 12 hr tablet Take 500  mg by mouth in the morning. 03/02/22   [provider]  tamsulosin (FLOMAX) 0.4 MG CAPS capsule TAKE 1 CAPSULE BY MOUTH TWICE A DAY 02/06/22   Marcine Matar, MD      Allergies    Lisinopril, Coconut flavor, and Benzodiazepines    Review of Systems   Review of Systems  Genitourinary:  Positive for flank pain.  All other systems reviewed and are negative.   Physical Exam Updated Vital Signs BP 130/85 (BP Location: Right Arm)   Pulse 82   Temp 97.9 F (36.6 C) (Oral)   Resp (!) 24   Ht 5\' 11"  (1.803 m)   Wt 108 kg   SpO2 97%   BMI 33.21 kg/m  Physical  Exam Vitals and nursing note reviewed.  Constitutional:      General: He is not in acute distress.    Appearance: He is well-developed.  HENT:     Head: Atraumatic.  Eyes:     Conjunctiva/sclera: Conjunctivae normal.  Cardiovascular:     Rate and Rhythm: Normal rate and regular rhythm.     Pulses: Normal pulses.     Heart sounds: Normal heart sounds.  Pulmonary:     Effort: Pulmonary effort is normal.     Breath sounds: Normal breath sounds.  Abdominal:     Palpations: Abdomen is soft.     Tenderness: There is no abdominal tenderness. There is no right CVA tenderness or left CVA tenderness.  Musculoskeletal:     Cervical back: Neck supple.  Skin:    Findings: No rash.  Neurological:     Mental Status: He is alert.     ED Results / Procedures / Treatments   Labs (all labs ordered are listed, but only abnormal results are displayed) Labs Reviewed  CBC WITH DIFFERENTIAL/PLATELET - Abnormal; Notable for the following components:      Result Value   Platelets 476 (*)    All other components within normal limits  COMPREHENSIVE METABOLIC PANEL - Abnormal; Notable for the following components:   Creatinine, Ser 1.48 (*)    GFR, Estimated 50 (*)    All other components within normal limits  LIPASE, BLOOD - Abnormal; Notable for the following components:   Lipase 60 (*)    All other components within normal limits  URINALYSIS, ROUTINE W REFLEX MICROSCOPIC - Abnormal; Notable for the following components:   Leukocytes,Ua SMALL (*)    All other components within normal limits  URINALYSIS, MICROSCOPIC (REFLEX)    EKG None  Radiology CT Renal Stone Study  Result Date: 08/04/2022 CLINICAL DATA:  Right flank pain for 3 days.  Prior kidney stones. EXAM: CT ABDOMEN AND PELVIS WITHOUT CONTRAST TECHNIQUE: Multidetector CT imaging of the abdomen and pelvis was performed following the standard protocol without IV contrast. RADIATION DOSE REDUCTION: This exam was performed according to  the departmental dose-optimization program which includes automated exposure control, adjustment of the mA and/or kV according to patient size and/or use of iterative reconstruction technique. COMPARISON:  12/30/2021 FINDINGS: Lower chest: No acute findings. Hepatobiliary: No mass visualized on this unenhanced exam. Prior cholecystectomy. No evidence of biliary obstruction. Pancreas: No mass or inflammatory process visualized on this unenhanced exam. Spleen: Prior splenectomy. Multiple hypertrophied accessory splenules within the abdomen and pelvis are stable, left side greater than right, consistent with splenosis. Adrenals/Urinary tract: No evidence of urolithiasis or hydronephrosis. Several left renal cysts remains stable. Diffuse bladder wall thickening is again seen, presumably due to chronic bladder outlet obstruction given  enlarged prostate. Stomach/Bowel: No evidence of obstruction, inflammatory process, or abnormal fluid collections. Vascular/Lymphatic: No pathologically enlarged lymph nodes identified. No evidence of abdominal aortic aneurysm. Aortic atherosclerotic calcification incidentally noted. Reproductive:  Stable markedly enlarged prostate. Other: Stable small bilateral inguinal hernias, which contain only fat. Several small midline ventral abdominal wall hernias also remains stable, and contain only fat. Musculoskeletal:  No suspicious bone lesions identified. IMPRESSION: No evidence of urolithiasis, hydronephrosis, or other acute findings. Stable markedly enlarged prostate, and findings of chronic bladder outlet obstruction. Stable small bilateral inguinal and ventral abdominal wall hernias, which contain only fat. Electronically Signed   By: Danae Orleans M.D.   On: 08/04/2022 16:51    Procedures Procedures    Medications Ordered in ED Medications - No data to display  ED Course/ Medical Decision Making/ A&P Clinical Course as of 08/04/22 1708  Sat Aug 04, 2022  [id number] Stable  72 year old male presenting for left flank pain.  Now improving.  Negative CT scan, labs at baseline no evidence of acute infection.  Will plan for outpatient follow-up with PCP for likely musculoskeletal etiology. [CC]    Clinical Course User Index [CC] Bruce Ade, MD                             Medical Decision Making Amount and/or Complexity of Data Reviewed Labs: ordered. Radiology: ordered.  Risk OTC drugs. Prescription drug management.   BP 130/85 (BP Location: Right Arm)   Pulse 82   Temp 97.9 F (36.6 C) (Oral)   Resp (!) 24   Ht 5\' 11"  (1.803 m)   Wt 108 kg   SpO2 97%   BMI 33.21 kg/m   44:36 PM 72 year old male with significant history of Crohn's disease, constipations, diabetes, diverticulosis and family history of colon cancer who presents with complaint of flank pain.  Patient report for the past 2 days he has had intermittent pain primarily on his right flank.  Pain is noticeable with any kind of movement and seems to resolved at rest.  Pain is nonradiating without any associated lightheadedness, dizziness, nausea, vomiting, diarrhea, constipation, urinary discomfort, hematuria, or rash.  He has been eating and drinking fine and having normal bowel movement.  He denies any specific treatment tried but daughter states he has been taking Tylenol without relief.  At this time he rates his pain as 1 out of 10.  Patient is hard of hearing from his right ear.  He has had remote history of kidney stones.  No report of any strenuous activities or heavy lifting.  On exam elderly male sitting in the bed appears to be in no acute discomfort.  Heart with normal rate and rhythm, lungs are clear to auscultation bilaterally abdomen is soft nontender no CVA tenderness no overlying skin changes no concerning rash to his flank.  No reproducible abdominal tenderness.  However given history of prior kidney stone as well as his age and comorbidity, workup initiated, will obtain  abdominal pelvis CT scan for further assessment.  Pain medication considered but patient denies any active pain at this time.  -Labs ordered, independently viewed and interpreted by me.  Labs remarkable for Cr. 1.48, similar to prior.  Lipase 60, similar to prior, ua without blood or uti -The patient was maintained on a cardiac monitor.  I personally viewed and interpreted the cardiac monitored which showed an underlying rhythm of: NSR -Imaging independently viewed and interpreted by me and I  agree with radiologist's interpretation.  Result remarkable for abd/pelvis CT without acute changes -This patient presents to the ED for concern of flank pain, this involves an extensive number of treatment options, and is a complaint that carries with it a high risk of complications and morbidity.  The differential diagnosis includes MSK pain, muscle strain, kidney stone, pyelonephritis, shingles -Co morbidities that complicate the patient evaluation includes HTN, kidney stone, Crohns -Treatment includes reassurance -Reevaluation of the patient after these medicines showed that the patient improved -PCP office notes or outside notes reviewed -Discussion with specialist attending DR. Countryman -Escalation to admission/observation considered: patients feels much better, is comfortable with discharge, and will follow up with PCP -Prescription medication considered, patient comfortable with tylenol and flexeril -Social Determinant of Health considered         Final Clinical Impression(s) / ED Diagnoses Final diagnoses:  Right flank pain    Rx / DC Orders ED Discharge Orders          Ordered    acetaminophen (TYLENOL) 500 MG tablet  Every 6 hours PRN        08/04/22 1707    cyclobenzaprine (FLEXERIL) 10 MG tablet  2 times daily PRN        08/04/22 1707              Fayrene Helper, PA-C 08/04/22 1710    Bruce Ade, MD 08/04/22 2127

## 2022-08-07 DIAGNOSIS — G4710 Hypersomnia, unspecified: Secondary | ICD-10-CM | POA: Diagnosis not present

## 2022-08-07 DIAGNOSIS — I10 Essential (primary) hypertension: Secondary | ICD-10-CM | POA: Diagnosis not present

## 2022-08-07 DIAGNOSIS — R0609 Other forms of dyspnea: Secondary | ICD-10-CM | POA: Diagnosis not present

## 2022-08-16 DIAGNOSIS — L03032 Cellulitis of left toe: Secondary | ICD-10-CM | POA: Diagnosis not present

## 2022-09-16 ENCOUNTER — Other Ambulatory Visit: Payer: Self-pay | Admitting: Family Medicine

## 2022-09-27 ENCOUNTER — Encounter: Payer: Self-pay | Admitting: Family Medicine

## 2022-09-27 ENCOUNTER — Ambulatory Visit (INDEPENDENT_AMBULATORY_CARE_PROVIDER_SITE_OTHER): Payer: Medicare Other

## 2022-09-27 ENCOUNTER — Ambulatory Visit (INDEPENDENT_AMBULATORY_CARE_PROVIDER_SITE_OTHER): Payer: Medicare Other | Admitting: Family Medicine

## 2022-09-27 VITALS — BP 118/68 | HR 83 | Temp 97.5°F | Wt 235.6 lb

## 2022-09-27 VITALS — BP 118/68 | HR 83 | Temp 97.5°F | Ht 71.0 in | Wt 235.0 lb

## 2022-09-27 DIAGNOSIS — E118 Type 2 diabetes mellitus with unspecified complications: Secondary | ICD-10-CM

## 2022-09-27 DIAGNOSIS — E1165 Type 2 diabetes mellitus with hyperglycemia: Secondary | ICD-10-CM | POA: Diagnosis not present

## 2022-09-27 DIAGNOSIS — Z0000 Encounter for general adult medical examination without abnormal findings: Secondary | ICD-10-CM

## 2022-09-27 DIAGNOSIS — M545 Low back pain, unspecified: Secondary | ICD-10-CM

## 2022-09-27 DIAGNOSIS — E039 Hypothyroidism, unspecified: Secondary | ICD-10-CM | POA: Diagnosis not present

## 2022-09-27 DIAGNOSIS — D519 Vitamin B12 deficiency anemia, unspecified: Secondary | ICD-10-CM

## 2022-09-27 DIAGNOSIS — R739 Hyperglycemia, unspecified: Secondary | ICD-10-CM

## 2022-09-27 DIAGNOSIS — Z23 Encounter for immunization: Secondary | ICD-10-CM

## 2022-09-27 DIAGNOSIS — Z131 Encounter for screening for diabetes mellitus: Secondary | ICD-10-CM

## 2022-09-27 DIAGNOSIS — E785 Hyperlipidemia, unspecified: Secondary | ICD-10-CM

## 2022-09-27 DIAGNOSIS — S069X9S Unspecified intracranial injury with loss of consciousness of unspecified duration, sequela: Secondary | ICD-10-CM | POA: Diagnosis not present

## 2022-09-27 DIAGNOSIS — I10 Essential (primary) hypertension: Secondary | ICD-10-CM | POA: Diagnosis not present

## 2022-09-27 DIAGNOSIS — G8929 Other chronic pain: Secondary | ICD-10-CM
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
## 2022-09-27 NOTE — Patient Instructions (Signed)
[name] , Thank you for taking time to come for your Medicare Wellness Visit. I appreciate your ongoing commitment to your health goals. Please review the following plan we discussed and let me know if I can assist you in the future.   Referrals/Orders/Follow-Ups/Clinician Recommendations: stay healthy and active , declined shingrix and covid immunizations at this time   This is a list of the screening recommended for you and due dates:  Health Maintenance  Topic Date Due   Zoster (Shingles) Vaccine (1 of 2) Never done   Eye exam for diabetics  03/02/2020   Medicare Annual Wellness Visit  03/30/2020   COVID-19 Vaccine (5 - 2023-24 season) 11/03/2021   Hemoglobin A1C  09/13/2022   Flu Shot  10/04/2022   Yearly kidney health urinalysis for diabetes  03/16/2023   Complete foot exam   05/24/2023   Yearly kidney function blood test for diabetes  08/04/2023   Colon Cancer Screening  04/25/2027   DTaP/Tdap/Td vaccine (3 - Td or Tdap) 03/13/2030   Hepatitis C Screening  Completed   HPV Vaccine  Aged Out   Pneumonia Vaccine  Discontinued    Advanced directives: (Copy Requested) Please bring a copy of your health care power of attorney and living will to the office to be added to your chart at your convenience.  Next Medicare Annual Wellness Visit scheduled for next year: Yes  Preventive Care 36 Years and Older, Male  Preventive care refers to lifestyle choices and visits with your health care provider that can promote health and wellness. What does preventive care include? A yearly physical exam. This is also called an annual well check. Dental exams once or twice a year. Routine eye exams. Ask your health care provider how often you should have your eyes checked. Personal lifestyle choices, including: Daily care of your teeth and gums. Regular physical activity. Eating a healthy diet. Avoiding tobacco and drug use. Limiting alcohol use. Practicing safe sex. Taking low doses of  aspirin every day. Taking vitamin and mineral supplements as recommended by your health care provider. What happens during an annual well check? The services and screenings done by your health care provider during your annual well check will depend on your age, overall health, lifestyle risk factors, and family history of disease. Counseling  Your health care provider may ask you questions about your: Alcohol use. Tobacco use. Drug use. Emotional well-being. Home and relationship well-being. Sexual activity. Eating habits. History of falls. Memory and ability to understand (cognition). Work and work Astronomer. Screening  You may have the following tests or measurements: Height, weight, and BMI. Blood pressure. Lipid and cholesterol levels. These may be checked every 5 years, or more frequently if you are over 57 years old. Skin check. Lung cancer screening. You may have this screening every year starting at age 33 if you have a 30-pack-year history of smoking and currently smoke or have quit within the past 15 years. Fecal occult blood test (FOBT) of the stool. You may have this test every year starting at age 44. Flexible sigmoidoscopy or colonoscopy. You may have a sigmoidoscopy every 5 years or a colonoscopy every 10 years starting at age 62. Prostate cancer screening. Recommendations will vary depending on your family history and other risks. Hepatitis C blood test. Hepatitis B blood test. Sexually transmitted disease (STD) testing. Diabetes screening. This is done by checking your blood sugar (glucose) after you have not eaten for a while (fasting). You may have this done every  1-3 years. Abdominal aortic aneurysm (AAA) screening. You may need this if you are a current or former smoker. Osteoporosis. You may be screened starting at age 34 if you are at high risk. Talk with your health care provider about your test results, treatment options, and if necessary, the need for more  tests. Vaccines  Your health care provider may recommend certain vaccines, such as: Influenza vaccine. This is recommended every year. Tetanus, diphtheria, and acellular pertussis (Tdap, Td) vaccine. You may need a Td booster every 10 years. Zoster vaccine. You may need this after age 62. Pneumococcal 13-valent conjugate (PCV13) vaccine. One dose is recommended after age 84. Pneumococcal polysaccharide (PPSV23) vaccine. One dose is recommended after age 74. Talk to your health care provider about which screenings and vaccines you need and how often you need them. This information is not intended to replace advice given to you by your health care provider. Make sure you discuss any questions you have with your health care provider. Document Released: 03/18/2015 Document Revised: 11/09/2015 Document Reviewed: 12/21/2014 Elsevier Interactive Patient Education  2017 [employer].  Fall Prevention in the Home Falls can cause injuries. They can happen to people of all ages. There are many things you can do to make your home safe and to help prevent falls. What can I do on the outside of my home? Regularly fix the edges of walkways and driveways and fix any cracks. Remove anything that might make you trip as you walk through a door, such as a raised step or threshold. Trim any bushes or trees on the path to your home. Use bright outdoor lighting. Clear any walking paths of anything that might make someone trip, such as rocks or tools. Regularly check to see if handrails are loose or broken. Make sure that both sides of any steps have handrails. Any raised decks and porches should have guardrails on the edges. Have any leaves, snow, or ice cleared regularly. Use sand or salt on walking paths during winter. Clean up any spills in your garage right away. This includes oil or grease spills. What can I do in the bathroom? Use night lights. Install grab bars by the toilet and in the tub and shower.  Do not use towel bars as grab bars. Use non-skid mats or decals in the tub or shower. If you need to sit down in the shower, use a plastic, non-slip stool. Keep the floor dry. Clean up any water that spills on the floor as soon as it happens. Remove soap buildup in the tub or shower regularly. Attach bath mats securely with double-sided non-slip rug tape. Do not have throw rugs and other things on the floor that can make you trip. What can I do in the bedroom? Use night lights. Make sure that you have a light by your bed that is easy to reach. Do not use any sheets or blankets that are too big for your bed. They should not hang down onto the floor. Have a firm chair that has side arms. You can use this for support while you get dressed. Do not have throw rugs and other things on the floor that can make you trip. What can I do in the kitchen? Clean up any spills right away. Avoid walking on wet floors. Keep items that you use a lot in easy-to-reach places. If you need to reach something above you, use a strong step stool that has a grab bar. Keep electrical cords out of the way.  Do not use floor polish or wax that makes floors slippery. If you must use wax, use non-skid floor wax. Do not have throw rugs and other things on the floor that can make you trip. What can I do with my stairs? Do not leave any items on the stairs. Make sure that there are handrails on both sides of the stairs and use them. Fix handrails that are broken or loose. Make sure that handrails are as long as the stairways. Check any carpeting to make sure that it is firmly attached to the stairs. Fix any carpet that is loose or worn. Avoid having throw rugs at the top or bottom of the stairs. If you do have throw rugs, attach them to the floor with carpet tape. Make sure that you have a light switch at the top of the stairs and the bottom of the stairs. If you do not have them, ask someone to add them for you. What else  can I do to help prevent falls? Wear shoes that: Do not have high heels. Have rubber bottoms. Are comfortable and fit you well. Are closed at the toe. Do not wear sandals. If you use a stepladder: Make sure that it is fully opened. Do not climb a closed stepladder. Make sure that both sides of the stepladder are locked into place. Ask someone to hold it for you, if possible. Clearly mark and make sure that you can see: Any grab bars or handrails. First and last steps. Where the edge of each step is. Use tools that help you move around (mobility aids) if they are needed. These include: Canes. Walkers. Scooters. Crutches. Turn on the lights when you go into a dark area. Replace any light bulbs as soon as they burn out. Set up your furniture so you have a clear path. Avoid moving your furniture around. If any of your floors are uneven, fix them. If there are any pets around you, be aware of where they are. Review your medicines with your doctor. Some medicines can make you feel dizzy. This can increase your chance of falling. Ask your doctor what other things that you can do to help prevent falls. This information is not intended to replace advice given to you by your health care provider. Make sure you discuss any questions you have with your health care provider. Document Released: 12/16/2008 Document Revised: 07/28/2015 Document Reviewed: 03/26/2014 Elsevier Interactive Patient Education  2017 [employer].

## 2022-09-27 NOTE — Addendum Note (Signed)
Addended by: Shelva Majestic on: 09/27/2022 05:57 PM   Modules accepted: Level of Service

## 2022-09-27 NOTE — Patient Instructions (Addendum)
Prevnar 20 today  Get diabetic eye exam scheduled once you change eye doctors  We have placed a referral for you today to [facility]. In some cases you will see # listed below- you can call this if you have not heard within a week. If you do not see # listed- you should receive a mychart message or phone call within a week with the # to call. Reach out to [place] if you ar enot scheduled within 2 weeks  Please stop by lab before you go If you have mychart- we will send your results within 3 business days of [place] receiving them.  If you do not have mychart- we will call you about results within 5 business days of [place] receiving them.  *please also note that you will see labs on mychart as soon as they post. I will later go in and write notes on them- will say "notes from Dr. Durene Cal"   Recommended follow up: Return in about 6 months (around 03/30/2023) for physical or sooner if needed.Schedule b4 you leave.

## 2022-09-27 NOTE — Addendum Note (Signed)
Addended by: Gwenette Greet on: 09/27/2022 04:15 PM   Modules accepted: Orders

## 2022-09-27 NOTE — Progress Notes (Signed)
Phone [phone] In person visit   Subjective:   Bruce [name] is a 72 y.o. year old very pleasant male patient who presents for/with See problem oriented charting Chief Complaint  Patient presents with   Medical Management of Chronic Issues   Diabetes   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    Priority: High   Crohn's ileitis 03/17/2012    Priority: High   Emphysema lung 10/27/2019    Priority: Medium    Myalgia due to statin 10/27/2019    Priority: Medium    Aortic atherosclerosis 03/30/2017    Priority: Medium    Osteoarthritis of left knee 06/21/2016    Priority: Medium    Former smoker 12/02/2015    Priority: Medium    Iron deficiency anemia 09/03/2013    Priority: Medium    B12 deficiency anemia 09/03/2013    Priority: Medium    Hyperlipidemia 12/17/2012    Priority: Medium    Essential hypertension 03/17/2012    Priority: Medium    Sleep apnea 03/17/2012    Priority: Medium    Hypothyroidism 03/17/2012    Priority: Medium    BPH associated with nocturia 03/17/2012    Priority: Medium    Erectile dysfunction 04/01/2018    Priority: Low   GERD (gastroesophageal reflux disease) 04/01/2018    Priority: Low   Hoarseness 06/27/2017    Priority: Low   Onychomycosis 06/21/2016    Priority: Low   DDD (degenerative disc disease), 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 30 tablet 0   aspirin 81 MG chewable tablet Chew 81 mg by mouth daily with supper.     carvedilol (COREG) 12.5 MG tablet Take 1 tablet (12.5 mg total) by mouth 2 (two) times daily with a meal. (Patient taking differently: Take 25 mg by mouth 2 (two) times daily with a meal.) 180 tablet 3   Cyanocobalamin (VITAMIN B-12 PO) Take 1 tablet by mouth daily with supper.     cyclobenzaprine (FLEXERIL) 10 MG tablet Take 1 tablet (10 mg total) by mouth 2 (two) times daily as needed for muscle spasms. 20 tablet 0   GARLIC PO Take 1 capsule by mouth with breakfast, with lunch, and with evening meal.     levothyroxine (SYNTHROID) 200 MCG tablet TAKE 1 TABLET (200 MCG TOTAL) BY MOUTH DAILY BEFORE BREAKFAST. 90 tablet 3   losartan (COZAAR) 25 MG tablet Take 1 tablet (25 mg total) by mouth in the  morning. 90 tablet 3   MAGNESIUM PO Take 1 tablet by mouth 2 (two) times daily.     Multiple Vitamins-Minerals (ONE-A-DAY MENS HEALTH FORMULA) TABS Take by mouth.     pantoprazole (PROTONIX) 40 MG tablet Take 1 tablet (40 mg total) by mouth daily. (Patient taking differently: Take 40 mg by mouth daily after lunch.) 180 tablet 1   Polyethyl Glycol-Propyl Glycol (SYSTANE) 0.4-0.3 % SOLN Place 1-2 drops into both eyes 3 (three) times daily as needed (dry/irritated eyes.).     polyethylene glycol (MIRALAX / GLYCOLAX) 17 g packet Take 17 g by mouth daily.     potassium gluconate 595 (99 K) MG TABS tablet Take 595 mg by mouth daily with supper.     tamsulosin (FLOMAX) 0.4 MG CAPS capsule TAKE 1 CAPSULE BY MOUTH TWICE A DAY 180 capsule 3   No current  facility-administered medications for this visit.     Objective:  BP 118/68   Pulse 83   Temp (!) 97.5 F (36.4 C)   Ht 5\' 11"  (1.803 m)   Wt 235 lb (106.6 kg)   SpO2 98%   BMI 32.78 kg/m  Gen: NAD, resting comfortably CV: RRR no murmurs rubs or gallops Lungs: CTAB no crackles, wheeze, rhonchi Abdomen: soft/nontender/nondistended/normal bowel sounds. No rebound or guarding.  Ext: minimal edema Skin: warm, dry Musculoskeletal: pain in right low back with deep palpation- reports recurrence with movements at time    Assessment and Plan  #Health maintenance - prevnar 20 today -declines shingrix  #Right flank pain- Emergency Department visit August 04 2022- no stones were seen- he is on preventative medicine for stoes- has continued to have some intermittent pain but not as bad -grabs him in certain positions -no obvious cause on CT renal stones study -we opted of the refer to sports medicine for their opinion  -pain not associated with bowel movement- less likely to be crohn's related  # Mild vascular neurocognitive disorder-with hypnic headaches-1 cup of coffee at night (no issues with sleep) helps with headaches- still working.  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 Rhea Belton stopped - they are avoiding biolgoics despite activity- continue to monitor   # Diabetes S: Medication:Typically diet controlled CBGs- doesn't check Exercise and diet- no regular exercise, stable weight Lab Results  Component Value Date   HGBA1C 5.6 03/15/2022   HGBA1C 5.8 11/23/2021   HGBA1C 6.0 05/17/2021   A/P: hopefully stable- update a1c today. Continue without meds for now   #Nonischemic cardiomyopathy #hypertension S:  medication: Carvedilol 12.5 mg twice daily, losartan 25 mg -cardiology note mentions amlodipine but he states not taking - no chest pain - shortness of breath only with leaning over then getting back up Unintentional weight gain-none  Edema-none  A/P: cardiomyopathy stable without signs fluid overload- continue current medications Hypertension well controlled continue current medications   #coronary artery calcifcations noted by [facility] cardiology #hyperlipidemia with statin myalgia #Aortic atherosclerosis S: Medication:None-LDL under 70 in 2022-2023 even without medication Lab Results  Component Value Date   CHOL 116 11/23/2021   HDL 32.50 (L) 11/23/2021   LDLCALC 44 11/23/2021   LDLDIRECT 82.0 11/02/2016   TRIG 193.0 (H) 11/23/2021   CHOLHDL 4 11/23/2021   A/P: coronary  artery disease asymptomatic continue current medications Aortic atherosclerosis (presumed stable)- LDL goal ideally <70 - at goal even without medications Lipids have been controlled lately without medications- monitor   #hypothyroidism S: compliant On thyroid medication-levothyroxine 200 mcg Lab Results  Component Value Date   TSH 2.38 03/15/2022  A/P:hopefully stable- update tsh today. Continue current meds for now    #BPH S: Medication: Flomax/tamsulosin 0.4 mg twice daily-  unfortunately with incontinence  -TURP was not option- was told too large - [place] laser enucleation would only be short term option (holmium laser enucleation) A/P: not sure if helping but we worry about obstruction off medicine- we opted to continue it    # GERD S:Medication: Pantoprazole 40 mg B12 levels related to PPI use: Takes B12 daily for prevention of deficiency A/P: stable- continue current medicines     #Nephrolithiasis history-remains on potassium gluconate 195 mg   Recommended follow up: Return in about 6 months (around 03/30/2023) for physical or sooner if needed.Schedule b4 you leave. Future Appointments  Date  Time Provider Department Center  10/03/2023  2:30 PM [facility] ANNUAL WELLNESS VISIT 1 [name]    Lab/Order associations:   ICD-10-CM   1. Type II diabetes mellitus with manifestations  E11.8     2. Essential hypertension  I10     3. Hypothyroidism, unspecified type  E03.9     4. Hyperlipidemia, unspecified hyperlipidemia type  E78.5 Comprehensive metabolic panel    CBC with Differential/Platelet    Lipid panel    5. Anemia due to vitamin B12 deficiency, unspecified B12 deficiency type  D51.9     6. Traumatic brain injury with loss of consciousness, sequela (HCC)  S06.9X9S     7. Chronic right-sided low back pain without sciatica  M54.50 Ambulatory referral to Sports Medicine   G89.29 CANCELED: Ambulatory referral to Sports Medicine    8. Hyperglycemia  R73.9 Hemoglobin A1c    9. Screening for diabetes mellitus  Z13.1 Hemoglobin A1c      No orders of the defined types were placed in this encounter.   Return precautions advised.  Tana Conch, MD

## 2022-09-29 DIAGNOSIS — G4733 Obstructive sleep apnea (adult) (pediatric): Secondary | ICD-10-CM | POA: Diagnosis not present

## 2022-10-03 ENCOUNTER — Ambulatory Visit: Payer: Medicare Other | Admitting: Family Medicine

## 2022-10-17 DIAGNOSIS — G4733 Obstructive sleep apnea (adult) (pediatric): Secondary | ICD-10-CM | POA: Diagnosis not present

## 2022-10-17 DIAGNOSIS — M47814 Spondylosis without myelopathy or radiculopathy, thoracic region: Secondary | ICD-10-CM | POA: Diagnosis not present

## 2022-10-17 DIAGNOSIS — G4710 Hypersomnia, unspecified: Secondary | ICD-10-CM | POA: Diagnosis not present

## 2022-10-17 DIAGNOSIS — I10 Essential (primary) hypertension: Secondary | ICD-10-CM | POA: Diagnosis not present

## 2022-10-19 DIAGNOSIS — M47814 Spondylosis without myelopathy or radiculopathy, thoracic region: Secondary | ICD-10-CM | POA: Diagnosis not present

## 2022-12-02 ENCOUNTER — Encounter: Payer: Self-pay | Admitting: Family Medicine

## 2022-12-03 ENCOUNTER — Telehealth: Payer: Medicare Other | Admitting: Nurse Practitioner

## 2022-12-03 DIAGNOSIS — U071 COVID-19: Secondary | ICD-10-CM

## 2022-12-03 MED ORDER — NIRMATRELVIR/RITONAVIR (PAXLOVID) TABLET (RENAL DOSING)
2.0000 | ORAL_TABLET | Freq: Two times a day (BID) | ORAL | 0 refills | Status: AC
Start: 2022-12-03 — End: 2022-12-08

## 2022-12-03 NOTE — Progress Notes (Signed)
Virtual Visit Consent   Bruce [name], you are scheduled for a virtual visit with a [facility] provider today. Just as with appointments in the office, your consent must be obtained to participate. Your consent will be active for this visit and any virtual visit you may have with one of our providers in the next 365 days. If you have a MyChart account, a copy of this consent can be sent to you electronically.  As this is a virtual visit, video technology does not allow for your provider to perform a traditional examination. This may limit your provider's ability to fully assess your condition. If your provider identifies any concerns that need to be evaluated in person or the need to arrange testing (such as labs, EKG, etc.), we will make arrangements to do so. Although advances in technology are sophisticated, we cannot ensure that it will always work on either your end or our end. If the connection with a video visit is poor, the visit may have to be switched to a telephone visit. With either a video or telephone visit, we are not always able to ensure that we have a secure connection.  By engaging in this virtual visit, you consent to the provision of healthcare and authorize for your insurance to be billed (if applicable) for the services provided during this visit. Depending on your insurance coverage, you may receive a charge related to this service.  I need to obtain your verbal consent now. Are you willing to proceed with your visit today? Bruce [name] has provided verbal consent on 12/03/2022 for a virtual visit (video or telephone). Viviano Simas, FNP  Date: 12/03/2022 7:00 PM  Virtual Visit via Video Note   I, Viviano Simas, connected with  Bruce [name]  ([id number], 07-Jul-1950) on 12/03/22 at  7:30 PM EDT by a video-enabled telemedicine application and verified that I am speaking with the correct person using two identifiers.  Location: Patient: Virtual Visit Location  Patient: Home Provider: Virtual Visit Location Provider: Home Office   I discussed the limitations of evaluation and management by telemedicine and the availability of in person appointments. The patient expressed understanding and agreed to proceed.    History of Present Illness: Bruce [name] is a 72 y.o. who identifies as a male who was assigned male at birth, and is being seen today for after testing positive for COVID with a home test.  He started feeling sick 2 days ago  Symptoms today include: Sore throat, cough, fatigue   He has had COVID in the past without complication/ hospitalization   He has not taken Paxlovid in the past   Cough without wheezing has not needed inhalers in the past    GFR 45 in July    Problems:  Patient Active Problem List   Diagnosis Date Noted   Mild vascular dementia without behavioral disturbance, psychotic disturbance, mood disturbance, or anxiety (HCC) 05/24/2022   Cognitive deficits 02/15/2020   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   Headache    Lacunar infarction    Emphysema lung 10/27/2019   Myalgia due to statin 10/27/2019   History of DVT (deep vein thrombosis) after J+J covid vaccine  06/26/2019   Nonischemic cardiomyopathy 04/01/2018   Erectile dysfunction 04/01/2018   GERD (gastroesophageal reflux disease) 04/01/2018   Hoarseness 06/27/2017   Aortic atherosclerosis 03/30/2017   Osteoarthritis of left knee 06/21/2016   Onychomycosis 06/21/2016   Former smoker 12/02/2015   DDD (degenerative disc disease),  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   Family history of colon cancer 01/26/2014   Iron deficiency anemia 09/03/2013   B12 deficiency anemia 09/03/2013   Type II diabetes mellitus with manifestations 08/17/2013   Hyperlipidemia 12/17/2012   Constipation 05/20/2012   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    Medications:  Current Outpatient Medications:    acetaminophen (TYLENOL) 500 MG tablet, Take 1 tablet (500 mg total) by mouth every 6 (six) hours as needed., Disp: 30 tablet, Rfl: 0   aspirin 81 MG chewable tablet, Chew 81 mg by mouth daily with supper., Disp: , Rfl:    carvedilol (COREG) 12.5 MG tablet, Take 1 tablet (12.5 mg total) by mouth 2 (two) times daily with a meal. (Patient taking differently: Take 25 mg by mouth 2 (two) times daily with a meal.), Disp: 180 tablet, Rfl: 3   Cyanocobalamin (VITAMIN B-12 PO), Take 1 tablet by mouth daily with supper., Disp: , Rfl:    cyclobenzaprine (FLEXERIL) 10 MG tablet, Take 1 tablet (10 mg total) by mouth 2 (two) times daily as needed for muscle spasms., Disp: 20 tablet, Rfl: 0   GARLIC PO, Take 1 capsule by mouth with breakfast, with lunch, and with evening meal., Disp: , Rfl:    levothyroxine (SYNTHROID) 200 MCG tablet, TAKE 1 TABLET (200 MCG TOTAL) BY MOUTH DAILY BEFORE BREAKFAST., Disp: 90 tablet, Rfl: 3   losartan (COZAAR) 25 MG tablet, Take 1 tablet (25 mg total) by mouth in the morning., Disp: 90 tablet, Rfl: 3   MAGNESIUM PO, Take 1 tablet by mouth 2 (two) times daily., Disp: , Rfl:    Multiple Vitamins-Minerals (ONE-A-DAY MENS HEALTH FORMULA) TABS, Take by mouth., Disp: , Rfl:    pantoprazole (PROTONIX) 40 MG tablet, Take 1 tablet (40 mg total) by mouth daily. (Patient taking differently: Take 40 mg by mouth daily after lunch.), Disp: 180 tablet, Rfl: 1   Polyethyl Glycol-Propyl Glycol (SYSTANE) 0.4-0.3 % SOLN, Place 1-2 drops into both eyes 3 (three)  times daily as needed (dry/irritated eyes.)., Disp: , Rfl:    polyethylene glycol (MIRALAX / GLYCOLAX) 17 g packet, Take 17 g by mouth daily., Disp: , Rfl:    potassium gluconate 595 (99 K) MG TABS tablet, Take 595 mg by mouth daily with supper., Disp: , Rfl:    tamsulosin (FLOMAX) 0.4 MG CAPS capsule, TAKE 1 CAPSULE BY MOUTH TWICE A DAY, Disp: 180 capsule, Rfl: 3  Observations/Objective: Patient is well-developed, well-nourished in no acute distress.  Resting comfortably  at home.  Head is normocephalic, atraumatic.  No labored breathing.  Speech is clear and coherent with logical content.  Patient is alert and oriented at baseline.    Assessment and Plan:  1. COVID-19  Hold Garlic until anti-viral completes   - nirmatrelvir/ritonavir, renal dosing, (PAXLOVID) 10 x 150 MG & 10 x 100MG  TABS; Take 2 tablets by mouth 2 (two) times daily for 5 days. (Take nirmatrelvir 150 mg  one tablet twice daily for 5 days and ritonavir 100 mg one tablet twice daily for 5 days) Patient GFR is 45  Dispense: 20 tablet; Refill: 0    May use Coricidin HBP or Plain Mucinex without decongestant to assist with cough  Follow up with new or worsening symptoms    Follow Up Instructions: I discussed the assessment and treatment plan with the patient. The patient was provided an opportunity to ask questions and all were answered. The patient agreed with the plan and demonstrated an understanding of the instructions.  A copy of instructions were sent to the patient via MyChart unless otherwise noted below.    The patient was advised to call back or seek an in-person evaluation if the symptoms worsen or if the condition fails to improve as anticipated.  Time:  I spent 15 minutes with the patient via telehealth technology discussing the above problems/concerns.    Viviano Simas, FNP

## 2022-12-03 NOTE — Telephone Encounter (Signed)
LVM to schedule patient a virtual visit.

## 2022-12-03 NOTE — Telephone Encounter (Signed)
Patient's daughter returned call and stated that since her brother is out of town and she is currently at work, they can't help patient do a virtual during PCP office hours. I offered caller to use Mychart virtual urgent care option and she agreed this would be best option. States she is thankful and will be doing this later this evening with patient.

## 2023-01-17 DIAGNOSIS — R3915 Urgency of urination: Secondary | ICD-10-CM | POA: Diagnosis not present

## 2023-01-17 DIAGNOSIS — R350 Frequency of micturition: Secondary | ICD-10-CM | POA: Diagnosis not present

## 2023-02-08 DIAGNOSIS — R350 Frequency of micturition: Secondary | ICD-10-CM | POA: Diagnosis not present

## 2023-03-12 ENCOUNTER — Other Ambulatory Visit: Payer: Self-pay | Admitting: Family Medicine

## 2023-03-15 DIAGNOSIS — R350 Frequency of micturition: Secondary | ICD-10-CM | POA: Diagnosis not present

## 2023-03-18 ENCOUNTER — Encounter: Payer: Self-pay | Admitting: Family Medicine

## 2023-03-18 ENCOUNTER — Ambulatory Visit (INDEPENDENT_AMBULATORY_CARE_PROVIDER_SITE_OTHER): Payer: Medicare Other | Admitting: Family Medicine

## 2023-03-18 VITALS — BP 120/70 | HR 88 | Temp 97.0°F | Ht 71.0 in | Wt 235.0 lb

## 2023-03-18 DIAGNOSIS — J189 Pneumonia, unspecified organism: Secondary | ICD-10-CM | POA: Diagnosis not present

## 2023-03-18 DIAGNOSIS — E118 Type 2 diabetes mellitus with unspecified complications: Secondary | ICD-10-CM

## 2023-03-18 DIAGNOSIS — I10 Essential (primary) hypertension: Secondary | ICD-10-CM

## 2023-03-18 DIAGNOSIS — J439 Emphysema, unspecified: Secondary | ICD-10-CM

## 2023-03-18 DIAGNOSIS — I428 Other cardiomyopathies: Secondary | ICD-10-CM | POA: Diagnosis not present

## 2023-03-18 DIAGNOSIS — R059 Cough, unspecified: Secondary | ICD-10-CM | POA: Diagnosis not present

## 2023-03-18 LAB — POC COVID19 BINAXNOW: SARS Coronavirus 2 Ag: NEGATIVE

## 2023-03-18 LAB — POCT INFLUENZA A/B
Influenza A, POC: NEGATIVE
Influenza B, POC: NEGATIVE

## 2023-03-18 MED ORDER — BENZONATATE 100 MG PO CAPS: 100.0000 mg | ORAL_CAPSULE | Freq: Two times a day (BID) | ORAL | 0 refills | Status: DC | PRN

## 2023-03-18 MED ORDER — AMOXICILLIN-POT CLAVULANATE 875-125 MG PO TABS: 1.0000 | ORAL_TABLET | Freq: Two times a day (BID) | ORAL | 0 refills | Status: AC

## 2023-03-18 MED ORDER — AZITHROMYCIN 250 MG PO TABS: ORAL_TABLET | ORAL | 0 refills | Status: AC

## 2023-03-18 NOTE — Patient Instructions (Addendum)
 Mucinex D would avoid due to potential to raise blood pressure- you can take plain mucinex or mucinex DM   Exam is concerning for possible pneumonia with crackles in the left lower lung -we discussed possible x-ray vs empiric treatment and opted for treatment with follow up if not improving in next 7-10 days or sooner if worsens (would definitely get CXR in that case).  - due to your age need a little stronger antibiotics with combo of azithromycin  and augmentin - please complete both  -tessalon  for cough while you wait- or could change the mucinex D (recommend against) to mucinex DM.   Recommended follow up: Return for next already scheduled visit or sooner if needed.

## 2023-03-18 NOTE — Progress Notes (Signed)
 Phone [phone] In person visit   Subjective:   Bruce [name] is a 73 y.o. year old very pleasant male patient who presents for/with See problem oriented charting Chief Complaint  Patient presents with   Cough    Pt c/o coughing bad since Friday with green mucous. 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    Priority: High   Crohn's ileitis 03/17/2012    Priority: High   Emphysema lung 10/27/2019    Priority: Medium    Myalgia due to statin 10/27/2019    Priority: Medium    Aortic atherosclerosis 03/30/2017    Priority: Medium    Osteoarthritis of left knee 06/21/2016    Priority: Medium    Former smoker 12/02/2015    Priority: Medium    Iron deficiency anemia 09/03/2013    Priority: Medium    B12 deficiency anemia 09/03/2013    Priority: Medium    Hyperlipidemia 12/17/2012    Priority: Medium    Essential hypertension 03/17/2012    Priority: Medium    Sleep apnea 03/17/2012    Priority: Medium    Hypothyroidism 03/17/2012    Priority: Medium    BPH associated with nocturia 03/17/2012    Priority: Medium    Erectile dysfunction 04/01/2018    Priority: Low   GERD (gastroesophageal reflux disease) 04/01/2018    Priority: Low   Hoarseness 06/27/2017    Priority: Low   Onychomycosis 06/21/2016    Priority: Low   DDD (degenerative disc disease), 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    Priority: Low   Family history of colon cancer 01/26/2014    Priority: Low    Constipation 05/20/2012    Priority: Low   Obesity 03/17/2012    Priority: Low   Mild vascular dementia without behavioral disturbance, psychotic disturbance, mood disturbance, or anxiety (HCC) 05/24/2022   Headache     Medications- reviewed and updated Current Outpatient Medications  Medication Sig Dispense Refill   acetaminophen  (TYLENOL ) 500 MG tablet Take 1 tablet (500 mg total) by mouth every 6 (six) hours as needed. 30 tablet 0   amoxicillin -clavulanate (AUGMENTIN ) 875-125 MG tablet Take 1 tablet by mouth 2 (two) times daily for 7 days. 14 tablet 0   aspirin  81 MG chewable tablet Chew 81 mg by mouth daily with supper.     azithromycin  (ZITHROMAX ) 250 MG tablet Take 2 tablets on day 1, then 1 tablet daily on days 2 through 5 6 tablet 0   benzonatate  (TESSALON ) 100 MG capsule Take 1 capsule (100 mg total) by mouth 2 (two) times daily as needed for cough. 20 capsule 0   carvedilol  (COREG ) 12.5 MG tablet TAKE 1 TABLET (12.5MG  TOTAL) BY MOUTH TWICE A DAY WITH MEALS 180 tablet 3   Cyanocobalamin  (VITAMIN B-12 PO) Take 1 tablet by mouth daily with supper.     cyclobenzaprine  (FLEXERIL ) 10 MG tablet Take 1 tablet (10  mg total) by mouth 2 (two) times daily as needed for muscle spasms. 20 tablet 0   GARLIC PO Take 1 capsule by mouth with breakfast, with lunch, and with evening meal.     levothyroxine  (SYNTHROID ) 200 MCG tablet TAKE 1 TABLET (200 MCG TOTAL) BY MOUTH DAILY BEFORE BREAKFAST. 90 tablet 3   losartan  (COZAAR ) 25 MG tablet Take 1 tablet (25 mg total) by mouth in the morning. 90 tablet 3   MAGNESIUM PO Take 1 tablet by mouth 2 (two) times daily.     Multiple Vitamins-Minerals (ONE-A-DAY MENS HEALTH FORMULA) TABS Take by mouth.     pantoprazole  (PROTONIX ) 40 MG tablet Take 1 tablet (40 mg total) by mouth daily. (Patient taking differently: Take 40 mg by mouth daily after lunch.) 180 tablet 1   Polyethyl Glycol-Propyl Glycol (SYSTANE) 0.4-0.3 % SOLN Place 1-2 drops into both eyes 3  (three) times daily as needed (dry/irritated eyes.).     polyethylene glycol (MIRALAX  / GLYCOLAX ) 17 g packet Take 17 g by mouth daily.     potassium gluconate 595 (99 K) MG TABS tablet Take 595 mg by mouth daily with supper.     tamsulosin  (FLOMAX ) 0.4 MG CAPS capsule TAKE 1 CAPSULE BY MOUTH TWICE A DAY 180 capsule 3   No current facility-administered medications for this visit.     Objective:  BP 120/70   Pulse 88   Temp (!) 97 F (36.1 C)   Ht 5' 11 (1.803 m)   Wt 235 lb (106.6 kg)   SpO2 96%   BMI 32.78 kg/m  Gen: NAD, resting comfortably Tympanic membrane's normal bilaterally, nasal turbinates erythematous and edematous with yellow discharge, oropharynx largely normal per his baseline CV: RRR no murmurs rubs or gallops Lungs: Left lower lung with recurrent crackles-does not clear with cough.  Rest of lung fields are normal/clear to auscultation Ext: Minimal edema Skin: warm, dry  Results for orders placed or performed in visit on 03/18/23 (from the past 24 hours)  POC COVID-19     Status: None   Collection Time: 03/18/23  3:10 PM  Result Value Ref Range   SARS Coronavirus 2 Ag Negative Negative  POCT Influenza A/B     Status: None   Collection Time: 03/18/23  3:10 PM  Result Value Ref Range   Influenza A, POC Negative Negative   Influenza B, POC Negative Negative       Assessment and Plan   # Cough/congestion S: Patient reports cough and congestion starting on last Friday- about 3 days ago.  Green sputum production has been noted. Ok sitting up but if lays down particularly on right side feels achy so he avoids this.  -No fever or chills, no shortness of breath above baseline, no sinus pain or pressure  -COVID and flu test negative today.  Had COVID-19 in September -had flu shot Friday before last. No ear pain -mucinex- D helps some A/P: Exam is concerning for possible pneumonia with crackles in the left lower lung -we discussed possible x-ray vs empiric  treatment and opted for treatment with follow up if not improving in next 7-10 days or sooner if worsens (would definitely get CXR in that case).  - due to your age need a little stronger antibiotics with combo of azithromycin  and augmentin - please complete both  -tessalon  for cough while you wait- or could change the mucinex D (recommend against) to mucinex DM.  -Does have nonischemic cardiomyopathy but I do not think this represents unilateral  fluid overload -Does have emphysema noted as incidental finding but no wheezing detected.  Would not call this emphysema exacerbation but should be high risk for bacterial infection regardless due to this which lowers the threshold for treatment   # Diabetes S: Medication:Typically diet controlled Lab Results  Component Value Date   HGBA1C 5.3 09/27/2022   HGBA1C 5.6 03/15/2022   HGBA1C 5.8 11/23/2021   A/P: Has been very well-controlled-discussed option of checking today versus at follow-up in April and prefers April-plan to check a bedtime   # Nonischemic cardiomyopathy #hypertension S: medication: Carvedilol  12.5 mg twice daily, losartan  25 mg -cardiology note mentions amlodipine but he states not taking Unintentional weight gain-none/weight stable  Edema-no increase    BP Readings from Last 3 Encounters:  03/18/23 120/70  09/27/22 118/68  09/27/22 118/68    A/P: Nonischemic cardiomyopathy-appears euvolemic- continue current medications Hypertension stable- continue current medicines   Recommended follow up: Return for next already scheduled visit or sooner if needed. Future Appointments  Date Time Provider Department Center  07/02/2023  2:20 PM Katrinka Garnette KIDD, MD [facility]  10/03/2023  2:30 PM [facility] ANNUAL WELLNESS VISIT 1 [name]    Lab/Order associations:   ICD-10-CM   1. Community acquired pneumonia of left lower lobe of lung  J18.9     2. Cough, unspecified type  R05.9 POC COVID-19    POCT Influenza A/B    3.  Type II diabetes mellitus with manifestations  E11.8     4. Essential hypertension  I10     5. Nonischemic cardiomyopathy  I42.8     6. Pulmonary emphysema, unspecified emphysema type (HCC) Chronic J43.9       Meds ordered this encounter  Medications   amoxicillin -clavulanate (AUGMENTIN ) 875-125 MG tablet    Sig: Take 1 tablet by mouth 2 (two) times daily for 7 days.    Dispense:  14 tablet    Refill:  0   azithromycin  (ZITHROMAX ) 250 MG tablet    Sig: Take 2 tablets on day 1, then 1 tablet daily on days 2 through 5    Dispense:  6 tablet    Refill:  0   benzonatate  (TESSALON ) 100 MG capsule    Sig: Take 1 capsule (100 mg total) by mouth 2 (two) times daily as needed for cough.    Dispense:  20 capsule    Refill:  0    Return precautions advised.  Garnette Katrinka, MD

## 2023-03-19 ENCOUNTER — Other Ambulatory Visit: Payer: Self-pay | Admitting: Urology

## 2023-03-19 DIAGNOSIS — N401 Enlarged prostate with lower urinary tract symptoms: Secondary | ICD-10-CM

## 2023-04-02 ENCOUNTER — Encounter: Payer: Medicare Other | Admitting: Family Medicine

## 2023-04-08 ENCOUNTER — Ambulatory Visit
Admission: RE | Admit: 2023-04-08 | Discharge: 2023-04-08 | Disposition: A | Discharge status: Home / Self Care | Payer: Medicare Other | Source: Ambulatory Visit | Attending: Urology | Admitting: Urology

## 2023-04-08 DIAGNOSIS — R350 Frequency of micturition: Secondary | ICD-10-CM | POA: Diagnosis not present

## 2023-04-08 DIAGNOSIS — N401 Enlarged prostate with lower urinary tract symptoms: Secondary | ICD-10-CM

## 2023-04-08 DIAGNOSIS — R3915 Urgency of urination: Secondary | ICD-10-CM | POA: Diagnosis not present

## 2023-04-08 HISTORY — PX: IR RADIOLOGIST EVAL & MGMT: IMG5224
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
## 2023-04-09 ENCOUNTER — Other Ambulatory Visit: Payer: Self-pay | Admitting: Interventional Radiology

## 2023-04-09 DIAGNOSIS — N400 Enlarged prostate without lower urinary tract symptoms: Secondary | ICD-10-CM

## 2023-04-11 ENCOUNTER — Other Ambulatory Visit (HOSPITAL_COMMUNITY): Payer: Self-pay | Admitting: Interventional Radiology

## 2023-04-11 ENCOUNTER — Telehealth (HOSPITAL_COMMUNITY): Payer: Self-pay | Admitting: Radiology

## 2023-04-11 DIAGNOSIS — N138 Other obstructive and reflux uropathy: Secondary | ICD-10-CM

## 2023-04-11 NOTE — Telephone Encounter (Signed)
 Called pt's son to see if pt was available for his PAE with Dr. Jinx Mourning on 04/25/23. The son has to work that day so that day is not good for them. I will call him back with some other dates once I have them. He agrees to this. JM

## 2023-04-25 ENCOUNTER — Telehealth (HOSPITAL_COMMUNITY): Payer: Self-pay | Admitting: Radiology

## 2023-04-25 NOTE — Telephone Encounter (Signed)
Called pt's son to schedule PAE with Dr. Elby Showers. No answer and no VM. JM

## 2023-04-26 DIAGNOSIS — E7800 Pure hypercholesterolemia, unspecified: Secondary | ICD-10-CM | POA: Diagnosis not present

## 2023-04-26 DIAGNOSIS — I10 Essential (primary) hypertension: Secondary | ICD-10-CM | POA: Diagnosis not present

## 2023-04-26 DIAGNOSIS — I2510 Atherosclerotic heart disease of native coronary artery without angina pectoris: Secondary | ICD-10-CM | POA: Diagnosis not present

## 2023-04-26 DIAGNOSIS — I428 Other cardiomyopathies: Secondary | ICD-10-CM | POA: Diagnosis not present

## 2023-05-01 ENCOUNTER — Telehealth (HOSPITAL_COMMUNITY): Payer: Self-pay | Admitting: Radiology

## 2023-05-01 NOTE — Telephone Encounter (Signed)
 Tried to call pt's son again to set up his PAE with Dr. Elby Showers. No VM. JM

## 2023-05-02 ENCOUNTER — Telehealth (HOSPITAL_COMMUNITY): Payer: Self-pay | Admitting: Radiology

## 2023-05-02 NOTE — Telephone Encounter (Signed)
 Called pt's son to see if he could bring patient in for his PAE with Dr. Elby Showers on 05/14/23. Mr. Roseanne Reno states he cannot do that day, as he works that day. I have made multiple attempts to schedule this patient with the son and his schedule never seems to line up with the availability of Dr. Elby Showers and anesthesia. Will continue to work on this. JM

## 2023-05-08 ENCOUNTER — Telehealth (HOSPITAL_COMMUNITY): Payer: Self-pay | Admitting: Radiology

## 2023-05-08 NOTE — Telephone Encounter (Signed)
 Attempted to reach pt's son again today to schedule his PAE with Dr. Elby Showers. No answer and no VM. JM

## 2023-05-24 ENCOUNTER — Ambulatory Visit
Admission: RE | Admit: 2023-05-24 | Discharge: 2023-05-24 | Disposition: A | Discharge status: Home / Self Care | Source: Ambulatory Visit | Attending: Interventional Radiology | Admitting: Interventional Radiology

## 2023-05-24 DIAGNOSIS — N400 Enlarged prostate without lower urinary tract symptoms: Secondary | ICD-10-CM

## 2023-05-24 MED ORDER — IOPAMIDOL (ISOVUE-370) INJECTION 76%
80.0000 mL | Freq: Once | INTRAVENOUS | Status: AC | PRN
  Administered 2023-05-24: 80 mL via INTRAVENOUS

## 2023-06-07 DIAGNOSIS — M25511 Pain in right shoulder: Secondary | ICD-10-CM | POA: Diagnosis not present

## 2023-06-18 ENCOUNTER — Encounter: Payer: Self-pay | Admitting: *Deleted

## 2023-06-24 ENCOUNTER — Other Ambulatory Visit: Payer: Self-pay | Admitting: Student

## 2023-06-24 ENCOUNTER — Telehealth (HOSPITAL_COMMUNITY): Payer: Self-pay | Admitting: Student

## 2023-06-24 ENCOUNTER — Other Ambulatory Visit (HOSPITAL_COMMUNITY): Payer: Self-pay | Admitting: Student

## 2023-06-24 DIAGNOSIS — N401 Enlarged prostate with lower urinary tract symptoms: Secondary | ICD-10-CM

## 2023-06-24 MED ORDER — SOLIFENACIN SUCCINATE 5 MG PO TABS: 5.0000 mg | ORAL_TABLET | Freq: Every day | ORAL | 0 refills | Status: AC

## 2023-06-24 MED ORDER — PHENAZOPYRIDINE HCL 100 MG PO TABS: 100.0000 mg | ORAL_TABLET | Freq: Three times a day (TID) | ORAL | 0 refills | Status: DC

## 2023-06-24 MED ORDER — METHYLPREDNISOLONE 4 MG PO TBPK: ORAL_TABLET | ORAL | 0 refills | Status: DC

## 2023-06-24 MED ORDER — CIPROFLOXACIN HCL 500 MG PO TABS: 500.0000 mg | ORAL_TABLET | Freq: Two times a day (BID) | ORAL | 0 refills | Status: AC

## 2023-06-24 NOTE — Telephone Encounter (Signed)
 Patient scheduled for prostate artery embolization 06/26/23 with Dr. Jinx Mourning. Post-procedure medications e-prescribed to the patient's pharmacy. Patient's daughter Adria notified. She is aware to arrive by 0600 and keep the patient NPO after midnight. She has the number to reach me if she has any questions/concerns prior to the procedure.   Saige Busby, AGACNP-BC 06/24/2023, 12:54 PM

## 2023-06-25 ENCOUNTER — Encounter (HOSPITAL_COMMUNITY): Payer: Self-pay | Admitting: Interventional Radiology

## 2023-06-25 ENCOUNTER — Encounter (HOSPITAL_COMMUNITY): Payer: Self-pay

## 2023-06-25 ENCOUNTER — Other Ambulatory Visit: Payer: Self-pay | Admitting: Radiology

## 2023-06-25 ENCOUNTER — Other Ambulatory Visit: Payer: Self-pay
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
## 2023-06-25 NOTE — Progress Notes (Addendum)
 SDW CALL  Patient was given pre-op instructions over the phone. The opportunity was given for the patient to ask questions. No further questions asked. Patient verbalized understanding of instructions given.   PCP - Shira Dopp Cardiologist - Kennie Peach- Last OV-04/26/23  PPM/ICD - denies Device Orders -  Rep Notified -   Chest x-ray - na EKG - 04/26/23 -CE tracing requested Stress Test - CE 03/31/21 ECHO - CE 03/31/21 Cardiac Cath - denies  Sleep Study - 10/2022 CPAP - no  Fasting Blood Sugar - na Checks Blood Sugar _____ times a day  Blood Thinner Instructions:na Aspirin  Instructions:hold day of surgery  ERAS Protcol -no PRE-SURGERY Ensure or G2-   COVID TEST- na   Anesthesia review: yes-HTN,cardiomyopathy,chronic DOE,history of difficult airway   Oral Hygiene is also important to reduce your risk of infection.  Remember - BRUSH YOUR TEETH THE MORNING OF SURGERY WITH YOUR REGULAR TOOTHPASTE

## 2023-06-26 ENCOUNTER — Encounter (HOSPITAL_COMMUNITY): Payer: Self-pay | Admitting: Interventional Radiology

## 2023-06-26 ENCOUNTER — Ambulatory Visit (HOSPITAL_COMMUNITY): Admitting: Physician Assistant

## 2023-06-26 ENCOUNTER — Ambulatory Visit (HOSPITAL_COMMUNITY)
Admission: RE | Admit: 2023-06-26 | Discharge: 2023-06-26 | Disposition: A | Discharge status: Home / Self Care | Source: Home / Self Care | Attending: Interventional Radiology | Admitting: Interventional Radiology

## 2023-06-26 ENCOUNTER — Encounter (HOSPITAL_COMMUNITY)
Admission: RE | Disposition: A | Discharge status: Home / Self Care | Payer: Self-pay | Source: Home / Self Care | Attending: Interventional Radiology

## 2023-06-26 ENCOUNTER — Other Ambulatory Visit: Payer: Self-pay

## 2023-06-26 ENCOUNTER — Ambulatory Visit (HOSPITAL_COMMUNITY)
Admission: RE | Admit: 2023-06-26 | Discharge: 2023-06-26 | Disposition: A | Discharge status: Home / Self Care | Source: Ambulatory Visit | Attending: Interventional Radiology | Admitting: Interventional Radiology

## 2023-06-26 DIAGNOSIS — M5134 Other intervertebral disc degeneration, thoracic region: Secondary | ICD-10-CM | POA: Diagnosis not present

## 2023-06-26 DIAGNOSIS — R310 Gross hematuria: Secondary | ICD-10-CM | POA: Diagnosis not present

## 2023-06-26 DIAGNOSIS — I129 Hypertensive chronic kidney disease with stage 1 through stage 4 chronic kidney disease, or unspecified chronic kidney disease: Secondary | ICD-10-CM | POA: Diagnosis not present

## 2023-06-26 DIAGNOSIS — N138 Other obstructive and reflux uropathy: Secondary | ICD-10-CM | POA: Diagnosis not present

## 2023-06-26 DIAGNOSIS — N401 Enlarged prostate with lower urinary tract symptoms: Secondary | ICD-10-CM

## 2023-06-26 DIAGNOSIS — N5201 Erectile dysfunction due to arterial insufficiency: Secondary | ICD-10-CM

## 2023-06-26 DIAGNOSIS — N1831 Chronic kidney disease, stage 3a: Secondary | ICD-10-CM | POA: Diagnosis not present

## 2023-06-26 DIAGNOSIS — N99820 Postprocedural hemorrhage and hematoma of a genitourinary system organ or structure following a genitourinary system procedure: Secondary | ICD-10-CM | POA: Diagnosis not present

## 2023-06-26 DIAGNOSIS — Z87891 Personal history of nicotine dependence: Secondary | ICD-10-CM | POA: Diagnosis not present

## 2023-06-26 DIAGNOSIS — F0150 Vascular dementia without behavioral disturbance: Secondary | ICD-10-CM | POA: Insufficient documentation

## 2023-06-26 DIAGNOSIS — N183 Chronic kidney disease, stage 3 unspecified: Secondary | ICD-10-CM | POA: Insufficient documentation

## 2023-06-26 DIAGNOSIS — N1330 Unspecified hydronephrosis: Secondary | ICD-10-CM | POA: Diagnosis not present

## 2023-06-26 DIAGNOSIS — K5000 Crohn's disease of small intestine without complications: Secondary | ICD-10-CM | POA: Diagnosis not present

## 2023-06-26 DIAGNOSIS — R351 Nocturia: Secondary | ICD-10-CM | POA: Diagnosis not present

## 2023-06-26 DIAGNOSIS — N179 Acute kidney failure, unspecified: Secondary | ICD-10-CM | POA: Diagnosis not present

## 2023-06-26 DIAGNOSIS — I10 Essential (primary) hypertension: Secondary | ICD-10-CM | POA: Diagnosis not present

## 2023-06-26 DIAGNOSIS — E1141 Type 2 diabetes mellitus with diabetic mononeuropathy: Secondary | ICD-10-CM | POA: Diagnosis not present

## 2023-06-26 DIAGNOSIS — R3915 Urgency of urination: Secondary | ICD-10-CM | POA: Insufficient documentation

## 2023-06-26 DIAGNOSIS — R339 Retention of urine, unspecified: Secondary | ICD-10-CM | POA: Diagnosis not present

## 2023-06-26 DIAGNOSIS — R338 Other retention of urine: Secondary | ICD-10-CM | POA: Diagnosis not present

## 2023-06-26 DIAGNOSIS — I428 Other cardiomyopathies: Secondary | ICD-10-CM | POA: Diagnosis not present

## 2023-06-26 DIAGNOSIS — E1121 Type 2 diabetes mellitus with diabetic nephropathy: Secondary | ICD-10-CM | POA: Insufficient documentation

## 2023-06-26 DIAGNOSIS — R350 Frequency of micturition: Secondary | ICD-10-CM | POA: Insufficient documentation

## 2023-06-26 DIAGNOSIS — K560 Paralytic ileus: Secondary | ICD-10-CM | POA: Diagnosis not present

## 2023-06-26 DIAGNOSIS — N320 Bladder-neck obstruction: Secondary | ICD-10-CM | POA: Diagnosis not present

## 2023-06-26 DIAGNOSIS — I700 Atherosclerosis of aorta: Secondary | ICD-10-CM | POA: Diagnosis not present

## 2023-06-26 DIAGNOSIS — E039 Hypothyroidism, unspecified: Secondary | ICD-10-CM | POA: Diagnosis not present

## 2023-06-26 DIAGNOSIS — E1122 Type 2 diabetes mellitus with diabetic chronic kidney disease: Secondary | ICD-10-CM | POA: Diagnosis not present

## 2023-06-26 DIAGNOSIS — E785 Hyperlipidemia, unspecified: Secondary | ICD-10-CM | POA: Diagnosis not present

## 2023-06-26 DIAGNOSIS — G4730 Sleep apnea, unspecified: Secondary | ICD-10-CM | POA: Diagnosis not present

## 2023-06-26 DIAGNOSIS — K219 Gastro-esophageal reflux disease without esophagitis: Secondary | ICD-10-CM | POA: Diagnosis not present

## 2023-06-26 DIAGNOSIS — K567 Ileus, unspecified: Secondary | ICD-10-CM | POA: Diagnosis not present

## 2023-06-26 DIAGNOSIS — Z79899 Other long term (current) drug therapy: Secondary | ICD-10-CM | POA: Insufficient documentation

## 2023-06-26 DIAGNOSIS — M1990 Unspecified osteoarthritis, unspecified site: Secondary | ICD-10-CM | POA: Insufficient documentation

## 2023-06-26 DIAGNOSIS — J449 Chronic obstructive pulmonary disease, unspecified: Secondary | ICD-10-CM | POA: Insufficient documentation

## 2023-06-26 DIAGNOSIS — H9190 Unspecified hearing loss, unspecified ear: Secondary | ICD-10-CM | POA: Diagnosis not present

## 2023-06-26 HISTORY — PX: IR US GUIDE VASC ACCESS LEFT: IMG2389

## 2023-06-26 HISTORY — PX: IR EMBO ARTERIAL NOT HEMORR HEMANG INC GUIDE ROADMAPPING: IMG5448

## 2023-06-26 HISTORY — DX: Unspecified dementia, unspecified severity, without behavioral disturbance, psychotic disturbance, mood disturbance, and anxiety: F03.90

## 2023-06-26 HISTORY — PX: IR ANGIOGRAM PELVIS SELECTIVE OR SUPRASELECTIVE: IMG661

## 2023-06-26 HISTORY — PX: RADIOLOGY WITH ANESTHESIA: SHX6223

## 2023-06-26 LAB — CBC
HCT: 37.7 % — ABNORMAL LOW (ref 39.0–52.0)
Hemoglobin: 12.4 g/dL — ABNORMAL LOW (ref 13.0–17.0)
MCH: 29.2 pg (ref 26.0–34.0)
MCHC: 32.9 g/dL (ref 30.0–36.0)
MCV: 88.7 fL (ref 80.0–100.0)
Platelets: 387 10*3/uL (ref 150–400)
RBC: 4.25 MIL/uL (ref 4.22–5.81)
RDW: 16.3 % — ABNORMAL HIGH (ref 11.5–15.5)
WBC: 9.5 10*3/uL (ref 4.0–10.5)
nRBC: 0 % (ref 0.0–0.2)

## 2023-06-26 LAB — PROTIME-INR
INR: 1 (ref 0.8–1.2)
Prothrombin Time: 13.8 s (ref 11.4–15.2)

## 2023-06-26 LAB — GLUCOSE, CAPILLARY: Glucose-Capillary: 106 mg/dL — ABNORMAL HIGH (ref 70–99)

## 2023-06-26 LAB — BASIC METABOLIC PANEL WITH GFR
Anion gap: 7 (ref 5–15)
BUN: 18 mg/dL (ref 8–23)
CO2: 24 mmol/L (ref 22–32)
Calcium: 8.9 mg/dL (ref 8.9–10.3)
Chloride: 107 mmol/L (ref 98–111)
Creatinine, Ser: 1.63 mg/dL — ABNORMAL HIGH (ref 0.61–1.24)
GFR, Estimated: 44 mL/min — ABNORMAL LOW (ref >60)
Glucose, Bld: 99 mg/dL (ref 70–99)
Potassium: 4.6 mmol/L (ref 3.5–5.1)
Sodium: 138 mmol/L (ref 135–145)

## 2023-06-26 SURGERY — RADIOLOGY WITH ANESTHESIA
Anesthesia: Monitor Anesthesia Care

## 2023-06-26 MED ORDER — CHLORHEXIDINE GLUCONATE CLOTH 2 % EX PADS: 6.0000 | MEDICATED_PAD | Freq: Every day | CUTANEOUS | Status: DC

## 2023-06-26 MED ORDER — FENTANYL CITRATE (PF) 100 MCG/2ML IJ SOLN: 25.0000 ug | INTRAMUSCULAR | Status: DC | PRN

## 2023-06-26 MED ORDER — LIDOCAINE HCL URETHRAL/MUCOSAL 2 % EX GEL
1.0000 | Freq: Once | CUTANEOUS | Status: AC
  Administered 2023-06-26: 1 via URETHRAL
  Filled 2023-06-26: qty 6

## 2023-06-26 MED ORDER — VERAPAMIL HCL 2.5 MG/ML IV SOLN
INTRAVENOUS | Status: AC
  Filled 2023-06-26: qty 2

## 2023-06-26 MED ORDER — GLYCOPYRROLATE 0.2 MG/ML IJ SOLN
INTRAMUSCULAR | Status: DC | PRN
  Administered 2023-06-26: .2 mg via INTRAVENOUS

## 2023-06-26 MED ORDER — NITROGLYCERIN 1 MG/10 ML FOR IR/CATH LAB
INTRA_ARTERIAL | Status: DC | PRN
  Administered 2023-06-26: 100 ug via INTRA_ARTERIAL

## 2023-06-26 MED ORDER — IODIXANOL 320 MG/ML IV SOLN
100.0000 mL | Freq: Once | INTRAVENOUS | Status: AC | PRN
  Administered 2023-06-26: 50 mL via INTRAVENOUS

## 2023-06-26 MED ORDER — CLEVIDIPINE BUTYRATE 0.5 MG/ML IV EMUL
INTRAVENOUS | Status: AC
  Filled 2023-06-26: qty 50

## 2023-06-26 MED ORDER — CIPROFLOXACIN IN D5W 400 MG/200ML IV SOLN
400.0000 mg | INTRAVENOUS | Status: AC
  Administered 2023-06-26: 400 mg via INTRAVENOUS
  Filled 2023-06-26: qty 200

## 2023-06-26 MED ORDER — DEXMEDETOMIDINE HCL IN NACL 80 MCG/20ML IV SOLN
INTRAVENOUS | Status: DC | PRN
  Administered 2023-06-26 (×2): 8 ug via INTRAVENOUS

## 2023-06-26 MED ORDER — OXYCODONE HCL 5 MG PO TABS: 5.0000 mg | ORAL_TABLET | Freq: Once | ORAL | Status: DC | PRN

## 2023-06-26 MED ORDER — PHENYLEPHRINE 80 MCG/ML (10ML) SYRINGE FOR IV PUSH (FOR BLOOD PRESSURE SUPPORT)
PREFILLED_SYRINGE | INTRAVENOUS | Status: DC | PRN
  Administered 2023-06-26 (×3): 160 ug via INTRAVENOUS
  Administered 2023-06-26: 80 ug via INTRAVENOUS
  Administered 2023-06-26: 160 ug via INTRAVENOUS

## 2023-06-26 MED ORDER — ONDANSETRON HCL 4 MG/2ML IJ SOLN
INTRAMUSCULAR | Status: DC | PRN
Start: 2023-06-26 — End: 2023-06-26
  Administered 2023-06-26: 4 mg via INTRAVENOUS

## 2023-06-26 MED ORDER — FENTANYL CITRATE (PF) 100 MCG/2ML IJ SOLN
INTRAMUSCULAR | Status: AC
  Filled 2023-06-26: qty 2

## 2023-06-26 MED ORDER — ACETAMINOPHEN 10 MG/ML IV SOLN: 1000.0000 mg | Freq: Once | INTRAVENOUS | Status: DC | PRN

## 2023-06-26 MED ORDER — OXYCODONE HCL 5 MG/5ML PO SOLN: 5.0000 mg | Freq: Once | ORAL | Status: DC | PRN

## 2023-06-26 MED ORDER — VERAPAMIL HCL 2.5 MG/ML IV SOLN: INTRA_ARTERIAL | Status: DC | PRN

## 2023-06-26 MED ORDER — LIDOCAINE-EPINEPHRINE 1 %-1:100000 IJ SOLN
INTRAMUSCULAR | Status: AC
  Filled 2023-06-26: qty 1

## 2023-06-26 MED ORDER — IODIXANOL 320 MG/ML IV SOLN
100.0000 mL | Freq: Once | INTRAVENOUS | Status: AC | PRN
  Administered 2023-06-26: 10 mL via INTRA_ARTERIAL

## 2023-06-26 MED ORDER — ORAL CARE MOUTH RINSE: 15.0000 mL | Freq: Once | OROMUCOSAL | Status: AC

## 2023-06-26 MED ORDER — NITROGLYCERIN 1 MG/10 ML FOR IR/CATH LAB
INTRA_ARTERIAL | Status: AC
  Filled 2023-06-26: qty 10

## 2023-06-26 MED ORDER — IODIXANOL 320 MG/ML IV SOLN
100.0000 mL | Freq: Once | INTRAVENOUS | Status: AC | PRN
  Administered 2023-06-26: 20 mL via INTRAVENOUS

## 2023-06-26 MED ORDER — PREDNISONE 20 MG PO TABS
20.0000 mg | ORAL_TABLET | Freq: Once | ORAL | Status: AC
  Administered 2023-06-26: 20 mg via ORAL
  Filled 2023-06-26: qty 1

## 2023-06-26 MED ORDER — LIDOCAINE 2% (20 MG/ML) 5 ML SYRINGE
INTRAMUSCULAR | Status: DC | PRN
  Administered 2023-06-26: 60 mg via INTRAVENOUS

## 2023-06-26 MED ORDER — LIDOCAINE-PRILOCAINE 2.5-2.5 % EX CREA
TOPICAL_CREAM | Freq: Once | CUTANEOUS | Status: AC
  Filled 2023-06-26: qty 5

## 2023-06-26 MED ORDER — CHLORHEXIDINE GLUCONATE 0.12 % MT SOLN
15.0000 mL | Freq: Once | OROMUCOSAL | Status: AC
  Administered 2023-06-26: 15 mL via OROMUCOSAL
  Filled 2023-06-26: qty 15

## 2023-06-26 MED ORDER — PHENYLEPHRINE HCL-NACL 20-0.9 MG/250ML-% IV SOLN
INTRAVENOUS | Status: DC | PRN
  Administered 2023-06-26: 50 ug/min via INTRAVENOUS

## 2023-06-26 MED ORDER — MIDAZOLAM HCL 2 MG/2ML IJ SOLN
INTRAMUSCULAR | Status: AC
  Filled 2023-06-26: qty 2

## 2023-06-26 MED ORDER — LACTATED RINGERS IV SOLN: INTRAVENOUS | Status: DC

## 2023-06-26 MED ORDER — ONDANSETRON HCL 4 MG/2ML IJ SOLN: 4.0000 mg | Freq: Once | INTRAMUSCULAR | Status: DC | PRN

## 2023-06-26 MED ORDER — INSULIN ASPART 100 UNIT/ML IJ SOLN: 0.0000 [IU] | INTRAMUSCULAR | Status: DC | PRN

## 2023-06-26 MED ORDER — HEPARIN SODIUM (PORCINE) 1000 UNIT/ML IJ SOLN
INTRAMUSCULAR | Status: AC
  Filled 2023-06-26: qty 10

## 2023-06-26 MED ORDER — NITROGLYCERIN 2 % TD OINT
1.0000 [in_us] | TOPICAL_OINTMENT | Freq: Once | TRANSDERMAL | Status: AC
  Administered 2023-06-26: 1 [in_us] via TOPICAL
  Filled 2023-06-26: qty 1

## 2023-06-26 MED ORDER — PROPOFOL 10 MG/ML IV BOLUS
INTRAVENOUS | Status: DC | PRN
  Administered 2023-06-26: 30 mg via INTRAVENOUS
  Administered 2023-06-26: 40 mg via INTRAVENOUS
  Administered 2023-06-26: 30 mg via INTRAVENOUS
  Administered 2023-06-26: 100 ug/kg/min via INTRAVENOUS

## 2023-06-26 MED ORDER — CARVEDILOL 12.5 MG PO TABS
12.5000 mg | ORAL_TABLET | Freq: Once | ORAL | Status: AC
  Administered 2023-06-26: 12.5 mg via ORAL
  Filled 2023-06-26: qty 1

## 2023-06-26 NOTE — Discharge Instructions (Addendum)

## 2023-06-26 NOTE — Sedation Documentation (Signed)
 Patient transported to PACU with this RN and CRNA. Left radial site assessed with PACU RN, TR band placed at 1025, 14cc in band. Level 0, site soft, no bleeding or hematoma.

## 2023-06-26 NOTE — H&P (Deleted)
   The note originally documented on this encounter has been moved the the encounter in which it belongs.
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
## 2023-06-26 NOTE — Anesthesia Postprocedure Evaluation (Signed)
 Anesthesia Post Note  Patient: CARNEL STEGMAN III  Procedure(s) Performed: RADIOLOGY WITH ANESTHESIA     Patient location during evaluation: PACU Anesthesia Type: MAC Level of consciousness: awake and alert Pain management: pain level controlled Vital Signs Assessment: post-procedure vital signs reviewed and stable Respiratory status: spontaneous breathing, nonlabored ventilation, respiratory function stable and patient connected to nasal cannula oxygen Cardiovascular status: blood pressure returned to baseline and stable Postop Assessment: no apparent nausea or vomiting Anesthetic complications: no   No notable events documented.  Last Vitals:  Vitals:   06/26/23 1145 06/26/23 1200  BP: (!) 142/90 (!) 149/88  Pulse: 83 84  Resp: 11 17  Temp:    SpO2: 97% 94%    Last Pain:  Vitals:   06/26/23 1145  TempSrc:   PainSc: 0-No pain                 Leslye Rast

## 2023-06-26 NOTE — Procedures (Signed)
 Interventional Radiology Procedure Note  Procedure: Prostate artery embolization  Findings: Please refer to procedural dictation for full description. Left radial artery access, 14 cc air in TR band at 10:25.    Complications: None immediate  Estimated Blood Loss: < 5 mL  Recommendations: Foley out now. TR band release per protocol. IR will arrange for 1 month outpatient follow up.   Creasie Doctor, MD

## 2023-06-26 NOTE — Procedures (Deleted)
   The note originally documented on this encounter has been moved the the encounter in which it belongs.

## 2023-06-26 NOTE — Progress Notes (Deleted)
   The note originally documented on this encounter has been moved the the encounter in which it belongs.

## 2023-06-26 NOTE — Transfer of Care (Signed)
 Immediate Anesthesia Transfer of Care Note  Patient: Bruce [name]  Procedure(s) Performed: RADIOLOGY WITH ANESTHESIA  Patient Location: PACU  Anesthesia Type:MAC  Level of Consciousness: awake, alert , and oriented  Airway & Oxygen Therapy: Patient Spontanous Breathing  Post-op Assessment: Report given to RN and Post -op Vital signs reviewed and stable  Post vital signs: Reviewed and stable  Last Vitals:  Vitals Value Taken Time  BP 127/90 06/26/23 1047  Temp    Pulse 88 06/26/23 1049  Resp 11 06/26/23 1049  SpO2 94 % 06/26/23 1049  Vitals shown include unfiled device data.  Last Pain:  Vitals:   06/26/23 0713  TempSrc:   PainSc: 0-No pain         Complications: No notable events documented.

## 2023-06-26 NOTE — Progress Notes (Signed)
 Short stay took over patient care at 1245 from Anda [place], [place] in PACU.

## 2023-06-27 ENCOUNTER — Encounter (HOSPITAL_COMMUNITY): Payer: Self-pay | Admitting: Interventional Radiology

## 2023-06-29 ENCOUNTER — Other Ambulatory Visit: Payer: Self-pay

## 2023-06-29 ENCOUNTER — Emergency Department (HOSPITAL_BASED_OUTPATIENT_CLINIC_OR_DEPARTMENT_OTHER)

## 2023-06-29 ENCOUNTER — Inpatient Hospital Stay (HOSPITAL_BASED_OUTPATIENT_CLINIC_OR_DEPARTMENT_OTHER)
Admission: EM | Admit: 2023-06-29 | Discharge: 2023-07-01 | DRG: 908 | Disposition: A | Discharge status: Home / Self Care | Attending: Family Medicine | Admitting: Family Medicine

## 2023-06-29 ENCOUNTER — Encounter (HOSPITAL_BASED_OUTPATIENT_CLINIC_OR_DEPARTMENT_OTHER): Payer: Self-pay

## 2023-06-29 DIAGNOSIS — N320 Bladder-neck obstruction: Secondary | ICD-10-CM | POA: Diagnosis present

## 2023-06-29 DIAGNOSIS — E039 Hypothyroidism, unspecified: Secondary | ICD-10-CM | POA: Diagnosis present

## 2023-06-29 DIAGNOSIS — R310 Gross hematuria: Secondary | ICD-10-CM | POA: Diagnosis not present

## 2023-06-29 DIAGNOSIS — Z8049 Family history of malignant neoplasm of other genital organs: Secondary | ICD-10-CM

## 2023-06-29 DIAGNOSIS — I428 Other cardiomyopathies: Secondary | ICD-10-CM | POA: Diagnosis present

## 2023-06-29 DIAGNOSIS — N281 Cyst of kidney, acquired: Secondary | ICD-10-CM | POA: Diagnosis not present

## 2023-06-29 DIAGNOSIS — G4730 Sleep apnea, unspecified: Secondary | ICD-10-CM | POA: Diagnosis present

## 2023-06-29 DIAGNOSIS — Z8673 Personal history of transient ischemic attack (TIA), and cerebral infarction without residual deficits: Secondary | ICD-10-CM

## 2023-06-29 DIAGNOSIS — N179 Acute kidney failure, unspecified: Secondary | ICD-10-CM | POA: Diagnosis not present

## 2023-06-29 DIAGNOSIS — N99820 Postprocedural hemorrhage and hematoma of a genitourinary system organ or structure following a genitourinary system procedure: Principal | ICD-10-CM | POA: Diagnosis present

## 2023-06-29 DIAGNOSIS — E785 Hyperlipidemia, unspecified: Secondary | ICD-10-CM | POA: Diagnosis present

## 2023-06-29 DIAGNOSIS — Z8249 Family history of ischemic heart disease and other diseases of the circulatory system: Secondary | ICD-10-CM

## 2023-06-29 DIAGNOSIS — N2889 Other specified disorders of kidney and ureter: Secondary | ICD-10-CM | POA: Diagnosis not present

## 2023-06-29 DIAGNOSIS — H9190 Unspecified hearing loss, unspecified ear: Secondary | ICD-10-CM | POA: Diagnosis present

## 2023-06-29 DIAGNOSIS — Z8052 Family history of malignant neoplasm of bladder: Secondary | ICD-10-CM

## 2023-06-29 DIAGNOSIS — K560 Paralytic ileus: Secondary | ICD-10-CM | POA: Diagnosis not present

## 2023-06-29 DIAGNOSIS — E1141 Type 2 diabetes mellitus with diabetic mononeuropathy: Secondary | ICD-10-CM | POA: Diagnosis present

## 2023-06-29 DIAGNOSIS — K5000 Crohn's disease of small intestine without complications: Secondary | ICD-10-CM | POA: Diagnosis present

## 2023-06-29 DIAGNOSIS — N401 Enlarged prostate with lower urinary tract symptoms: Secondary | ICD-10-CM | POA: Diagnosis present

## 2023-06-29 DIAGNOSIS — N1831 Chronic kidney disease, stage 3a: Secondary | ICD-10-CM | POA: Diagnosis present

## 2023-06-29 DIAGNOSIS — K567 Ileus, unspecified: Secondary | ICD-10-CM | POA: Diagnosis not present

## 2023-06-29 DIAGNOSIS — Z9181 History of falling: Secondary | ICD-10-CM

## 2023-06-29 DIAGNOSIS — I700 Atherosclerosis of aorta: Secondary | ICD-10-CM | POA: Diagnosis present

## 2023-06-29 DIAGNOSIS — M5134 Other intervertebral disc degeneration, thoracic region: Secondary | ICD-10-CM | POA: Diagnosis present

## 2023-06-29 DIAGNOSIS — Z860100 Personal history of colon polyps, unspecified: Secondary | ICD-10-CM

## 2023-06-29 DIAGNOSIS — Z79890 Hormone replacement therapy (postmenopausal): Secondary | ICD-10-CM | POA: Diagnosis not present

## 2023-06-29 DIAGNOSIS — E1122 Type 2 diabetes mellitus with diabetic chronic kidney disease: Secondary | ICD-10-CM | POA: Diagnosis present

## 2023-06-29 DIAGNOSIS — N138 Other obstructive and reflux uropathy: Secondary | ICD-10-CM | POA: Diagnosis present

## 2023-06-29 DIAGNOSIS — Z9049 Acquired absence of other specified parts of digestive tract: Secondary | ICD-10-CM

## 2023-06-29 DIAGNOSIS — R339 Retention of urine, unspecified: Secondary | ICD-10-CM | POA: Diagnosis not present

## 2023-06-29 DIAGNOSIS — Z808 Family history of malignant neoplasm of other organs or systems: Secondary | ICD-10-CM

## 2023-06-29 DIAGNOSIS — F0150 Vascular dementia without behavioral disturbance: Secondary | ICD-10-CM | POA: Diagnosis present

## 2023-06-29 DIAGNOSIS — Z7982 Long term (current) use of aspirin: Secondary | ICD-10-CM

## 2023-06-29 DIAGNOSIS — Z86718 Personal history of other venous thrombosis and embolism: Secondary | ICD-10-CM

## 2023-06-29 DIAGNOSIS — Z8261 Family history of arthritis: Secondary | ICD-10-CM

## 2023-06-29 DIAGNOSIS — R338 Other retention of urine: Secondary | ICD-10-CM | POA: Diagnosis present

## 2023-06-29 DIAGNOSIS — Z87820 Personal history of traumatic brain injury: Secondary | ICD-10-CM

## 2023-06-29 DIAGNOSIS — I129 Hypertensive chronic kidney disease with stage 1 through stage 4 chronic kidney disease, or unspecified chronic kidney disease: Secondary | ICD-10-CM | POA: Diagnosis present

## 2023-06-29 DIAGNOSIS — K219 Gastro-esophageal reflux disease without esophagitis: Secondary | ICD-10-CM | POA: Diagnosis present

## 2023-06-29 DIAGNOSIS — Z820 Family history of epilepsy and other diseases of the nervous system: Secondary | ICD-10-CM

## 2023-06-29 DIAGNOSIS — Z87891 Personal history of nicotine dependence: Secondary | ICD-10-CM

## 2023-06-29 DIAGNOSIS — Z91018 Allergy to other foods: Secondary | ICD-10-CM

## 2023-06-29 DIAGNOSIS — N1330 Unspecified hydronephrosis: Secondary | ICD-10-CM | POA: Diagnosis present

## 2023-06-29 DIAGNOSIS — K439 Ventral hernia without obstruction or gangrene: Secondary | ICD-10-CM | POA: Diagnosis not present

## 2023-06-29 DIAGNOSIS — Z79899 Other long term (current) drug therapy: Secondary | ICD-10-CM

## 2023-06-29 DIAGNOSIS — Z800 Family history of malignant neoplasm of digestive organs: Secondary | ICD-10-CM

## 2023-06-29 DIAGNOSIS — Z888 Allergy status to other drugs, medicaments and biological substances status: Secondary | ICD-10-CM

## 2023-06-29 DIAGNOSIS — Z9081 Acquired absence of spleen: Secondary | ICD-10-CM

## 2023-06-29 LAB — URINALYSIS, W/ REFLEX TO CULTURE (INFECTION SUSPECTED)
Bilirubin Urine: NEGATIVE
Glucose, UA: NEGATIVE mg/dL
Ketones, ur: NEGATIVE mg/dL
Nitrite: NEGATIVE
Specific Gravity, Urine: 1.009 (ref 1.005–1.030)
pH: 5.5 (ref 5.0–8.0)

## 2023-06-29 LAB — COMPREHENSIVE METABOLIC PANEL WITH GFR
ALT: 14 U/L (ref 0–44)
AST: 28 U/L (ref 15–41)
Albumin: 4 g/dL (ref 3.5–5.0)
Alkaline Phosphatase: 89 U/L (ref 38–126)
Anion gap: 15 (ref 5–15)
BUN: 29 mg/dL — ABNORMAL HIGH (ref 8–23)
CO2: 17 mmol/L — ABNORMAL LOW (ref 22–32)
Calcium: 9.4 mg/dL (ref 8.9–10.3)
Chloride: 101 mmol/L (ref 98–111)
Creatinine, Ser: 3.36 mg/dL — ABNORMAL HIGH (ref 0.61–1.24)
GFR, Estimated: 18 mL/min — ABNORMAL LOW (ref >60)
Glucose, Bld: 107 mg/dL — ABNORMAL HIGH (ref 70–99)
Potassium: 4 mmol/L (ref 3.5–5.1)
Sodium: 134 mmol/L — ABNORMAL LOW (ref 135–145)
Total Bilirubin: 0.7 mg/dL (ref 0.0–1.2)
Total Protein: 7.1 g/dL (ref 6.5–8.1)

## 2023-06-29 LAB — CBC WITH DIFFERENTIAL/PLATELET
Abs Immature Granulocytes: 0.05 10*3/uL (ref 0.00–0.07)
Basophils Absolute: 0 10*3/uL (ref 0.0–0.1)
Basophils Relative: 0 %
Eosinophils Absolute: 0.2 10*3/uL (ref 0.0–0.5)
Eosinophils Relative: 1 %
HCT: 40.5 % (ref 39.0–52.0)
Hemoglobin: 13.6 g/dL (ref 13.0–17.0)
Immature Granulocytes: 0 %
Lymphocytes Relative: 7 %
Lymphs Abs: 1.1 10*3/uL (ref 0.7–4.0)
MCH: 28.7 pg (ref 26.0–34.0)
MCHC: 33.6 g/dL (ref 30.0–36.0)
MCV: 85.4 fL (ref 80.0–100.0)
Monocytes Absolute: 1.7 10*3/uL — ABNORMAL HIGH (ref 0.1–1.0)
Monocytes Relative: 10 %
Neutro Abs: 13.7 10*3/uL — ABNORMAL HIGH (ref 1.7–7.7)
Neutrophils Relative %: 82 %
Platelets: 386 10*3/uL (ref 150–400)
RBC: 4.74 MIL/uL (ref 4.22–5.81)
RDW: 15.9 % — ABNORMAL HIGH (ref 11.5–15.5)
WBC: 16.8 10*3/uL — ABNORMAL HIGH (ref 4.0–10.5)
nRBC: 0 % (ref 0.0–0.2)

## 2023-06-29 LAB — LIPASE, BLOOD: Lipase: 41 U/L (ref 11–51)

## 2023-06-29 MED ORDER — RANOLAZINE ER 500 MG PO TB12
500.0000 mg | ORAL_TABLET | Freq: Two times a day (BID) | ORAL | Status: DC
  Administered 2023-06-29 – 2023-07-01 (×4): 500 mg via ORAL
  Filled 2023-06-29 (×4): qty 1

## 2023-06-29 MED ORDER — ALBUTEROL SULFATE (2.5 MG/3ML) 0.083% IN NEBU: 2.5000 mg | INHALATION_SOLUTION | RESPIRATORY_TRACT | Status: DC | PRN

## 2023-06-29 MED ORDER — ONDANSETRON HCL 4 MG/2ML IJ SOLN: 4.0000 mg | Freq: Four times a day (QID) | INTRAMUSCULAR | Status: DC | PRN

## 2023-06-29 MED ORDER — CIPROFLOXACIN HCL 500 MG PO TABS: 500.0000 mg | ORAL_TABLET | Freq: Two times a day (BID) | ORAL | Status: DC

## 2023-06-29 MED ORDER — LACTATED RINGERS IV BOLUS
1000.0000 mL | Freq: Once | INTRAVENOUS | Status: AC
  Administered 2023-06-29: 1000 mL via INTRAVENOUS

## 2023-06-29 MED ORDER — TRAZODONE HCL 50 MG PO TABS
25.0000 mg | ORAL_TABLET | Freq: Every evening | ORAL | Status: DC | PRN
  Administered 2023-06-29 – 2023-06-30 (×2): 25 mg via ORAL
  Filled 2023-06-29 (×2): qty 1

## 2023-06-29 MED ORDER — MORPHINE SULFATE (PF) 2 MG/ML IV SOLN
2.0000 mg | Freq: Once | INTRAVENOUS | Status: AC
  Administered 2023-06-29: 2 mg via INTRAVENOUS
  Filled 2023-06-29: qty 1

## 2023-06-29 MED ORDER — ACETAMINOPHEN 650 MG RE SUPP: 650.0000 mg | Freq: Four times a day (QID) | RECTAL | Status: DC | PRN

## 2023-06-29 MED ORDER — ONDANSETRON HCL 4 MG PO TABS: 4.0000 mg | ORAL_TABLET | Freq: Four times a day (QID) | ORAL | Status: DC | PRN

## 2023-06-29 MED ORDER — ACETAMINOPHEN 325 MG PO TABS
650.0000 mg | ORAL_TABLET | Freq: Four times a day (QID) | ORAL | Status: DC | PRN
  Administered 2023-06-30: 650 mg via ORAL
  Filled 2023-06-29: qty 2

## 2023-06-29 MED ORDER — LEVOTHYROXINE SODIUM 100 MCG PO TABS
200.0000 ug | ORAL_TABLET | Freq: Every day | ORAL | Status: DC
  Administered 2023-06-30 – 2023-07-01 (×2): 200 ug via ORAL
  Filled 2023-06-29 (×2): qty 2

## 2023-06-29 MED ORDER — LACTATED RINGERS IV SOLN: INTRAVENOUS | Status: AC

## 2023-06-29 MED ORDER — FENTANYL CITRATE PF 50 MCG/ML IJ SOSY
12.5000 ug | PREFILLED_SYRINGE | INTRAMUSCULAR | Status: DC | PRN
  Administered 2023-06-30: 12.5 ug via INTRAVENOUS
  Filled 2023-06-29: qty 1

## 2023-06-29 MED ORDER — ONDANSETRON HCL 4 MG/2ML IJ SOLN
4.0000 mg | Freq: Once | INTRAMUSCULAR | Status: AC
  Administered 2023-06-29: 4 mg via INTRAVENOUS
  Filled 2023-06-29: qty 2

## 2023-06-29 MED ORDER — CARVEDILOL 12.5 MG PO TABS
12.5000 mg | ORAL_TABLET | Freq: Two times a day (BID) | ORAL | Status: DC
  Administered 2023-06-30 – 2023-07-01 (×3): 12.5 mg via ORAL
  Filled 2023-06-29 (×3): qty 1

## 2023-06-29 MED ORDER — ASPIRIN 81 MG PO CHEW
81.0000 mg | CHEWABLE_TABLET | Freq: Every day | ORAL | Status: DC
  Administered 2023-06-30 – 2023-07-01 (×2): 81 mg via ORAL
  Filled 2023-06-29 (×2): qty 1

## 2023-06-29 MED ORDER — TAMSULOSIN HCL 0.4 MG PO CAPS
0.4000 mg | ORAL_CAPSULE | Freq: Two times a day (BID) | ORAL | Status: DC
  Administered 2023-06-29 – 2023-07-01 (×4): 0.4 mg via ORAL
  Filled 2023-06-29 (×4): qty 1

## 2023-06-29 MED ORDER — CIPROFLOXACIN HCL 500 MG PO TABS
500.0000 mg | ORAL_TABLET | Freq: Every day | ORAL | Status: AC
  Administered 2023-06-30 – 2023-07-01 (×2): 500 mg via ORAL
  Filled 2023-06-29 (×2): qty 1

## 2023-06-29 MED ORDER — HEPARIN SODIUM (PORCINE) 5000 UNIT/ML IJ SOLN
5000.0000 [IU] | Freq: Three times a day (TID) | INTRAMUSCULAR | Status: DC
  Administered 2023-06-29 – 2023-07-01 (×5): 5000 [IU] via SUBCUTANEOUS
  Filled 2023-06-29 (×5): qty 1

## 2023-06-29 MED ORDER — MORPHINE SULFATE (PF) 4 MG/ML IV SOLN
4.0000 mg | Freq: Once | INTRAVENOUS | Status: AC
  Administered 2023-06-29: 4 mg via INTRAVENOUS
  Filled 2023-06-29: qty 1

## 2023-06-29 NOTE — ED Notes (Addendum)
 Note entered in error
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
## 2023-06-29 NOTE — ED Provider Notes (Signed)
 [place] EMERGENCY DEPARTMENT AT [facility] Provider Note   CSN: [id number] Arrival date & time: 06/29/23  1015     History  Chief Complaint  Patient presents with   Constipation    Bruce [name] is a 73 y.o. male.  Patient is a 73 year old male with a history of Crohn's disease status post multiple resections and prior bowel obstructions who is not on any chronic therapy for his Crohn's due to his age, prior DVTs, hypertension, nonischemic cardiomyopathy, diabetes, dementia, BPH with recent IR procedure for embolization of the prostate on Wednesday who is presenting today due to worsening abdominal pain and no bowel movement since Tuesday.  Initially started with just feeling bloated and poor appetite.  However this morning he woke up with nausea and more abdominal pain.  He has had poor intake for the last few days and despite taking MiraLAX , senna, magnesium citrate and trying an enema he has not been able to have a bowel movement.  He has been urinating a little bit.  He denies any fever, chest pain or shortness of breath.  The history is provided by the patient and a relative.  Constipation      Home Medications Prior to Admission medications   Medication Sig Start Date End Date Taking? Authorizing Provider  acetaminophen  (TYLENOL ) 500 MG tablet Take 1 tablet (500 mg total) by mouth every 6 (six) hours as needed. 08/04/22   Debbra Fairy, PA-C  aspirin  81 MG chewable tablet Chew 81 mg by mouth in the morning.    [provider]  carvedilol  (COREG ) 12.5 MG tablet TAKE 1 TABLET (12.5MG  TOTAL) BY MOUTH TWICE A DAY WITH MEALS 03/12/23   Almira Jaeger, MD  ciprofloxacin  (CIPRO ) 500 MG tablet Take 1 tablet (500 mg total) by mouth 2 (two) times daily for 7 days. 06/24/23 07/01/23  Covington, Jamie R, NP  GARLIC PO Take 1 capsule by mouth 3 (three) times daily before meals.    [provider]  GEMTESA 75 MG TABS Take 75 mg by mouth at bedtime. 06/09/23    [provider]  levothyroxine  (SYNTHROID ) 200 MCG tablet TAKE 1 TABLET (200 MCG TOTAL) BY MOUTH DAILY BEFORE BREAKFAST. 09/17/22   Almira Jaeger, MD  losartan  (COZAAR ) 25 MG tablet Take 1 tablet (25 mg total) by mouth in the morning. 07/31/22   Almira Jaeger, MD  methylPREDNISolone  (MEDROL  DOSEPAK) 4 MG TBPK tablet Taper per instructions on box 06/24/23   Covington, Jamie R, NP  pantoprazole  (PROTONIX ) 40 MG tablet Take 1 tablet (40 mg total) by mouth daily. Patient taking differently: Take 40 mg by mouth daily as needed (indigestion/heartburn.). 03/15/22   Pyrtle, Amber Bail, MD  phenazopyridine  (PYRIDIUM ) 100 MG tablet Take 1 tablet (100 mg total) by mouth in the morning, at noon, and at bedtime for 7 days. 06/24/23 07/01/23  Covington, Jamie R, NP  Polyethyl Glycol-Propyl Glycol (SYSTANE) 0.4-0.3 % SOLN Place 1-2 drops into both eyes 3 (three) times daily as needed (dry/irritated eyes.).    [provider]  polyethylene glycol (MIRALAX  / GLYCOLAX ) 17 g packet Take 17 g by mouth daily as needed (constipation.).    [provider]  potassium gluconate 595 (99 K) MG TABS tablet Take 595 mg by mouth in the morning.    [provider]  ranolazine (RANEXA) 500 MG 12 hr tablet Take 500 mg by mouth 2 (two) times daily.    [provider]  solifenacin  (VESICARE ) 5 MG tablet  Take 1 tablet (5 mg total) by mouth daily for 7 days. 06/24/23 07/01/23  Covington, Jamie R, NP  tamsulosin  (FLOMAX ) 0.4 MG CAPS capsule TAKE 1 CAPSULE BY MOUTH TWICE A DAY 08/06/22   Trent Frizzle, MD      Allergies    Lisinopril , Coconut flavoring agent (non-screening), Food, and Benzodiazepines    Review of Systems   Review of Systems  Gastrointestinal:  Positive for constipation.    Physical Exam Updated Vital Signs BP 118/80   Pulse 97   Temp 97.6 F (36.4 C) (Oral)   Resp 18   Ht 5\' 11"  (1.803 m)   Wt 104.3 kg   SpO2 90%   BMI 32.08 kg/m  Physical Exam Vitals and  nursing note reviewed.  Constitutional:      General: He is not in acute distress.    Appearance: He is well-developed.  HENT:     Head: Normocephalic and atraumatic.  Eyes:     Conjunctiva/sclera: Conjunctivae normal.     Pupils: Pupils are equal, round, and reactive to light.  Cardiovascular:     Rate and Rhythm: Normal rate and regular rhythm.     Pulses: Normal pulses.     Heart sounds: No murmur heard. Pulmonary:     Effort: Pulmonary effort is normal. No respiratory distress.     Breath sounds: Normal breath sounds. No wheezing or rales.  Abdominal:     General: Bowel sounds are absent. There is distension.     Palpations: Abdomen is soft.     Tenderness: There is generalized abdominal tenderness. There is guarding. There is no rebound.     Comments: Patient has pain everywhere but more severe in the lower quadrants  Genitourinary:    Comments: Rectal exam is normal without impaction Musculoskeletal:        General: No tenderness. Normal range of motion.     Cervical back: Normal range of motion and neck supple.  Skin:    General: Skin is warm and dry.     Coloration: Skin is pale.     Findings: No erythema or rash.  Neurological:     Mental Status: He is alert and oriented to person, place, and time. Mental status is at baseline.  Psychiatric:        Behavior: Behavior normal.     ED Results / Procedures / Treatments   Labs (all labs ordered are listed, but only abnormal results are displayed) Labs Reviewed  CBC WITH DIFFERENTIAL/PLATELET - Abnormal; Notable for the following components:      Result Value   WBC 16.8 (*)    RDW 15.9 (*)    Neutro Abs 13.7 (*)    Monocytes Absolute 1.7 (*)    All other components within normal limits  COMPREHENSIVE METABOLIC PANEL WITH GFR - Abnormal; Notable for the following components:   Sodium 134 (*)    CO2 17 (*)    Glucose, Bld 107 (*)    BUN 29 (*)    Creatinine, Ser 3.36 (*)    GFR, Estimated 18 (*)    All other  components within normal limits  URINALYSIS, W/ REFLEX TO CULTURE (INFECTION SUSPECTED) - Abnormal; Notable for the following components:   Hgb urine dipstick MODERATE (*)    Protein, ur TRACE (*)    Leukocytes,Ua SMALL (*)    Bacteria, UA RARE (*)    All other components within normal limits  URINE CULTURE  LIPASE, BLOOD    EKG None  Radiology  CT ABDOMEN PELVIS WO CONTRAST Result Date: 06/29/2023 CLINICAL DATA:  Generalized abdominal pain. Prostate embolization 3 days ago. History of colon resections. EXAM: CT ABDOMEN AND PELVIS WITHOUT CONTRAST TECHNIQUE: Multidetector CT imaging of the abdomen and pelvis was performed following the standard protocol without IV contrast. RADIATION DOSE REDUCTION: This exam was performed according to the departmental dose-optimization program which includes automated exposure control, adjustment of the mA and/or kV according to patient size and/or use of iterative reconstruction technique. COMPARISON:  08/04/2022 FINDINGS: Lower chest: Left base scarring laterally. Normal heart size without pericardial or pleural effusion. Aortic valve calcification. Hepatobiliary: Cholecystectomy. Normal noncontrast appearance of the liver, without biliary duct dilatation. Pancreas: Normal, without mass or ductal dilatation. 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 Adrenals/Urinary Tract: Mild right adrenal thickening and nodularity are unchanged. Normal left adrenal gland. Mild renal cortical thinning bilaterally.  No renal calculi. 1.9 cm interpolar left renal lesion measures 31 HU on 37/2 and is similar in size to the prior exam. When compared to a contrast enhanced exam of 10/26/2021, this is most consistent with a hemorrhagic/proteinaceous cyst. Ureters are mildly prominent proximally, likely related to bladder distension. Mild left and minimal right-sided caliectasis. The bladder is mild-to-moderately distended, without stone.  Stomach/Bowel: Normal stomach, without wall thickening. Scattered colonic diverticula. The colon is primarily fluid-filled and mildly dilated. Transitions are identified within the upper and lower sigmoid including on 43/2 and 61/2. No obstructive mass. Surgical changes at the ileocecal junction. No small bowel dilatation. Vascular/Lymphatic: Aortic atherosclerosis. No abdominopelvic adenopathy. Reproductive: Moderate to marked prostatomegaly. Other: No significant free fluid. Bilateral, right larger than left fat containing inguinal hernias. Multiple pole foci of fat containing ventral abdominal wall laxity and hernia. Musculoskeletal: Schmorl's nodes within the lumbar spine. IMPRESSION: 1. Prostatomegaly with mild to moderate bladder distension. Minimal hydroureteronephrosis bilaterally, likely secondary. 2. Diffuse colonic dilatation, favored postprocedure adynamic ileus. There are areas of mild proximal and distal sigmoid transition from dilated to more normal caliber, without obstructive mass. 3. Aortic valvular calcifications. Consider echocardiography to evaluate for valvular dysfunction. 4. Fat containing areas of ventral abdominal wall hernia and laxity. 5. Left renal minimally complex 1.9 cm cyst does not warrant specific imaging follow-up. 6.  Aortic Atherosclerosis (ICD10-I70.0). Electronically Signed   By: Lore Rode M.D.   On: 06/29/2023 13:42    Procedures Procedures    Medications Ordered in ED Medications  morphine  (PF) 4 MG/ML injection 4 mg (4 mg Intravenous Given 06/29/23 1233)  ondansetron  (ZOFRAN ) injection 4 mg (4 mg Intravenous Given 06/29/23 1233)  lactated ringers  bolus 1,000 mL (1,000 mLs Intravenous New Bag/Given 06/29/23 1233)    ED Course/ Medical Decision Making/ A&P                                 Medical Decision Making Amount and/or Complexity of Data Reviewed External Data Reviewed: notes. Labs: ordered. Decision-making details documented in ED  Course. Radiology: ordered and independent interpretation performed. Decision-making details documented in ED Course. ECG/medicine tests: ordered and independent interpretation performed. Decision-making details documented in ED Course.  Risk Prescription drug management.   Pt with multiple medical problems and comorbidities and presenting today with a complaint that caries a high risk for morbidity and mortality.  Here today with his daughter for the above symptoms.  Concern for bowel obstruction versus dehydration and AKI versus bladder retention.  Low suspicion for cardiac or respiratory issues  at this time.  I independently interpreted patient's labs and CBC today with a leukocytosis of 16 but patient has been on steroids since his prostate embolization.  Hemoglobin is stable at 13, CMP today with new AKI with creatinine of 3.36 from his baseline of 1.6 with normal LFTs and anion gap, lipase within normal limits.  Patient's bladder scan did have a volume of 900 and a Foley catheter was placed with improvement of his pain. CT is pending.  Patient given pain and nausea control.  I have independently visualized and interpreted pt's images today.  CT showed significant bladder distention prior to Foley catheter placement, they also see diffuse colonic dilatation favor postprocedure adynamic ileus but there are some areas of mild proximal and distal sigmoid transition from dilated to more normal caliber.  Given patient's new AKI, ileus and symptoms we will admit for hydration, recheck renal function and ensure no worsening GI symptoms.  Discussed this with the patient and his family and they are comfortable with this plan.  Feel the patient needs admission.  Consulted hospitalist.         Final Clinical Impression(s) / ED Diagnoses Final diagnoses:  AKI (acute kidney injury) (HCC)  Urinary retention  Adynamic ileus [facility])    Rx / DC Orders ED Discharge Orders     None          Almond Army, MD 06/29/23 1410

## 2023-06-29 NOTE — ED Notes (Signed)
 ED Provider at bedside.

## 2023-06-29 NOTE — ED Triage Notes (Signed)
 Pt POV from home w/ daughter c/o constipation since Tuesday. Pt had procedure done on prostate on Wednesday. Pt c/o lower abd pain, nausea starting this AM, no vomiting. Hx of Crohns, bowel obstruction and 3 resections.

## 2023-06-29 NOTE — ED Notes (Signed)
 Called [facility] for transport, pt bed assignment is ready

## 2023-06-29 NOTE — Progress Notes (Signed)
 Plan of Care Note for accepted transfer   Patient: Bruce [name] MRN: [id number]   DOA: 06/29/2023  Facility requesting transfer: [facility] Requesting Provider: Dr. Leida Puna Reason for transfer: AKI Facility course:  73 year old man PMH Crohn's disease, status post embolization of the prostate for BPH 4/23 who presented with abdominal pain, last bowel movement 4/22, bloating, poor appetite, nausea.  Vital signs stable.  Workup revealed acute kidney injury, acute urinary retention, diffuse colonic dilatation probably adynamic ileus.  Plan of care: The patient is accepted for admission to Med-surg  unit, at [facility]..   Admitted for AKI, urinary retention, adynamic ileus  Patient remains under the care of Dr. Leida Puna and the emergency department team until arrival to [facility].  Author: Jerline Moon, MD 06/29/2023  Check [email or web address] for on-call coverage.  Nursing staff, Please call [facility] Admits & Consults System-Wide number on Amion as soon as patient's arrival, so appropriate admitting provider can evaluate the pt.

## 2023-06-30 DIAGNOSIS — N179 Acute kidney failure, unspecified: Secondary | ICD-10-CM | POA: Diagnosis not present

## 2023-06-30 DIAGNOSIS — R310 Gross hematuria: Secondary | ICD-10-CM

## 2023-06-30 DIAGNOSIS — N401 Enlarged prostate with lower urinary tract symptoms: Secondary | ICD-10-CM

## 2023-06-30 DIAGNOSIS — R338 Other retention of urine: Secondary | ICD-10-CM | POA: Diagnosis not present

## 2023-06-30 DIAGNOSIS — N138 Other obstructive and reflux uropathy: Secondary | ICD-10-CM

## 2023-06-30 DIAGNOSIS — R339 Retention of urine, unspecified: Secondary | ICD-10-CM

## 2023-06-30 LAB — BASIC METABOLIC PANEL WITH GFR
Anion gap: 9 (ref 5–15)
BUN: 27 mg/dL — ABNORMAL HIGH (ref 8–23)
CO2: 23 mmol/L (ref 22–32)
Calcium: 8.7 mg/dL — ABNORMAL LOW (ref 8.9–10.3)
Chloride: 104 mmol/L (ref 98–111)
Creatinine, Ser: 1.93 mg/dL — ABNORMAL HIGH (ref 0.61–1.24)
GFR, Estimated: 36 mL/min — ABNORMAL LOW (ref >60)
Glucose, Bld: 89 mg/dL (ref 70–99)
Potassium: 4.1 mmol/L (ref 3.5–5.1)
Sodium: 136 mmol/L (ref 135–145)

## 2023-06-30 LAB — CBC
HCT: 40.5 % (ref 39.0–52.0)
Hemoglobin: 13.1 g/dL (ref 13.0–17.0)
MCH: 29.2 pg (ref 26.0–34.0)
MCHC: 32.3 g/dL (ref 30.0–36.0)
MCV: 90.4 fL (ref 80.0–100.0)
Platelets: 353 10*3/uL (ref 150–400)
RBC: 4.48 MIL/uL (ref 4.22–5.81)
RDW: 16.5 % — ABNORMAL HIGH (ref 11.5–15.5)
WBC: 12.3 10*3/uL — ABNORMAL HIGH (ref 4.0–10.5)
nRBC: 0 % (ref 0.0–0.2)

## 2023-06-30 LAB — URINE CULTURE: Culture: NO GROWTH

## 2023-06-30 MED ORDER — HYDROMORPHONE HCL 1 MG/ML IJ SOLN
0.5000 mg | Freq: Once | INTRAMUSCULAR | Status: AC
  Administered 2023-06-30: 0.5 mg via INTRAVENOUS
  Filled 2023-06-30: qty 0.5

## 2023-06-30 MED ORDER — CHLORHEXIDINE GLUCONATE CLOTH 2 % EX PADS
6.0000 | MEDICATED_PAD | Freq: Every day | CUTANEOUS | Status: DC
Start: 2023-06-30 — End: 2023-07-01
  Administered 2023-06-30 – 2023-07-01 (×2): 6 via TOPICAL
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
## 2023-06-30 NOTE — Hospital Course (Signed)
 Bruce [name] is a 73 y.o. male with a history of Crohn's disease, DVT, hypertension, nonischemic cardiomyopathy, BPH status post recent prostate artery embolization.  Patient presented secondary to abdominal pain and constipation with evidence of adynamic ileus and acute urinary retention on admission.  Patient managed with clear liquid diet initially in addition to IV fluids for supportive care.  Urology was consulted for evidence of acute urinary retention with poor urine output from Foley catheter.  Foley catheter was exchanged on 4/27.  Hospitalization complicated by associated AKI, improving.

## 2023-06-30 NOTE — Progress Notes (Signed)
 PROGRESS NOTE    [name]  JXB:[id number] DOB: [birth year] DOA: 06/29/2023 PCP: Almira Jaeger, MD   Brief Narrative: Bruce [name] is a 73 y.o. male with a history of Crohn's disease, DVT, hypertension, nonischemic cardiomyopathy, BPH status post recent prostate artery embolization.  Patient presented secondary to abdominal pain and constipation with evidence of adynamic ileus and acute urinary retention on admission.  Patient managed with clear liquid diet initially in addition to IV fluids for supportive care.  Urology was consulted for evidence of acute urinary retention with poor urine output from Foley catheter.  Foley catheter was exchanged on 4/27.  Hospitalization complicated by associated AKI, improving.   Assessment and Plan:  Adynamic ileus Associated abdominal pain, distention and lack of bowel movements. Symptoms, however, possibly related to urinary retention. Patient with multiple bowel movements today. If ileus present on admission, it appears to have resolved.  Acute urinary retention Present on admission secondary to blood clots. Urology consulted on 4/27 and replaced patient's foley catheter. Retention appears to be resolved at this time; continuous bladder irrigation deferred for now. -Follow-up ongoing urology recommendations  AKI on CKD stage IIIa Baseline creatinine of 1.4-1.5. Creatinine of 3.36 on admission. Secondary to obstruction. Patient started on IV fluids. Acute urinary retention addressed. Creatinine down to 1.93. -BMP in AM  BPH with urinary retention Bilateral hydronephrosis Patient is s/p prostate artery embolization prior to admission; foley in place on admission and exchanged during admission.  Nonischemic cardiomyopathy Stable. -Continue home aspirin , Coreg  and Ranexa  Hypothyroidism -Continue Synthroid    DVT prophylaxis: SCDs Code Status:   Code Status: Full Code Family Communication: Daughter at bedside Disposition  Plan: Discharge home pending ongoing urology recommendations. Likely discharge in 1-2 days.   Consultants:  Urology  Procedures:  Foley catheter placement  Antimicrobials: None    Subjective: Patient reports feeling very relieved since urology replaced his foley catheter.   Objective: BP 114/65 (BP Location: Left Arm)   Pulse 97   Temp 98 F (36.7 C) (Oral)   Resp 20   Ht 5\' 11"  (1.803 m)   Wt 104.3 kg   SpO2 94%   BMI 32.08 kg/m   Examination:  General exam: Appears calm and comfortable Respiratory system: Clear to auscultation. Respiratory effort normal. Cardiovascular system: S1 & S2 heard, RRR. No murmurs. Gastrointestinal system: Abdomen is nondistended, soft and mildly tender at flanks. Normal bowel sounds heard. Central nervous system: Alert and oriented. No focal neurological deficits. Psychiatry: Judgement and insight appear normal. Mood & affect appropriate.    Data Reviewed: I have personally reviewed following labs and imaging studies  CBC Lab Results  Component Value Date   WBC 16.8 (H) 06/29/2023   RBC 4.74 06/29/2023   HGB 13.6 06/29/2023   HCT 40.5 06/29/2023   MCV 85.4 06/29/2023   MCH 28.7 06/29/2023   PLT 386 06/29/2023   MCHC 33.6 06/29/2023   RDW 15.9 (H) 06/29/2023   LYMPHSABS 1.1 06/29/2023   MONOABS 1.7 (H) 06/29/2023   EOSABS 0.2 06/29/2023   BASOSABS 0.0 06/29/2023     Last metabolic panel Lab Results  Component Value Date   NA 134 (L) 06/29/2023   K 4.0 06/29/2023   CL 101 06/29/2023   CO2 17 (L) 06/29/2023   BUN 29 (H) 06/29/2023   CREATININE 3.36 (H) 06/29/2023   GLUCOSE 107 (H) 06/29/2023   GFRNONAA 18 (L) 06/29/2023   GFRAA 69 10/28/2019   CALCIUM  9.4 06/29/2023   PHOS  4.5 08/17/2013   PROT 7.1 06/29/2023   ALBUMIN 4.0 06/29/2023   BILITOT 0.7 06/29/2023   ALKPHOS 89 06/29/2023   AST 28 06/29/2023   ALT 14 06/29/2023   ANIONGAP 15 06/29/2023    GFR: Estimated Creatinine Clearance: 23.7 mL/min (A) (by C-G  formula based on SCr of 3.36 mg/dL (H)).  No results found for this or any previous visit (from the past 240 hours).    Radiology Studies: CT ABDOMEN PELVIS WO CONTRAST Result Date: 06/29/2023 CLINICAL DATA:  Generalized abdominal pain. Prostate embolization 3 days ago. History of colon resections. EXAM: CT ABDOMEN AND PELVIS WITHOUT CONTRAST TECHNIQUE: Multidetector CT imaging of the abdomen and pelvis was performed following the standard protocol without IV contrast. RADIATION DOSE REDUCTION: This exam was performed according to the departmental dose-optimization program which includes automated exposure control, adjustment of the mA and/or kV according to patient size and/or use of iterative reconstruction technique. COMPARISON:  08/04/2022 FINDINGS: Lower chest: Left base scarring laterally. Normal heart size without pericardial or pleural effusion. Aortic valve calcification. Hepatobiliary: Cholecystectomy. Normal noncontrast appearance of the liver, without biliary duct dilatation. Pancreas: Normal, without mass or ductal dilatation. 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 Adrenals/Urinary Tract: Mild right adrenal thickening and nodularity are unchanged. Normal left adrenal gland. Mild renal cortical thinning bilaterally.  No renal calculi. 1.9 cm interpolar left renal lesion measures 31 HU on 37/2 and is similar in size to the prior exam. When compared to a contrast enhanced exam of 10/26/2021, this is most consistent with a hemorrhagic/proteinaceous cyst. Ureters are mildly prominent proximally, likely related to bladder distension. Mild left and minimal right-sided caliectasis. The bladder is mild-to-moderately distended, without stone. Stomach/Bowel: Normal stomach, without wall thickening. Scattered colonic diverticula. The colon is primarily fluid-filled and mildly dilated. Transitions are identified within the upper and lower sigmoid including  on 43/2 and 61/2. No obstructive mass. Surgical changes at the ileocecal junction. No small bowel dilatation. Vascular/Lymphatic: Aortic atherosclerosis. No abdominopelvic adenopathy. Reproductive: Moderate to marked prostatomegaly. Other: No significant free fluid. Bilateral, right larger than left fat containing inguinal hernias. Multiple pole foci of fat containing ventral abdominal wall laxity and hernia. Musculoskeletal: Schmorl's nodes within the lumbar spine. IMPRESSION: 1. Prostatomegaly with mild to moderate bladder distension. Minimal hydroureteronephrosis bilaterally, likely secondary. 2. Diffuse colonic dilatation, favored postprocedure adynamic ileus. There are areas of mild proximal and distal sigmoid transition from dilated to more normal caliber, without obstructive mass. 3. Aortic valvular calcifications. Consider echocardiography to evaluate for valvular dysfunction. 4. Fat containing areas of ventral abdominal wall hernia and laxity. 5. Left renal minimally complex 1.9 cm cyst does not warrant specific imaging follow-up. 6.  Aortic Atherosclerosis (ICD10-I70.0). Electronically Signed   By: Lore Rode M.D.   On: 06/29/2023 13:42      LOS: 1 day    Aneita Keens, MD [facility] 06/30/2023, 7:47 AM   If 7PM-7AM, please contact night-coverage [email or web address]

## 2023-07-01 DIAGNOSIS — N179 Acute kidney failure, unspecified: Secondary | ICD-10-CM | POA: Diagnosis not present

## 2023-07-01 LAB — BASIC METABOLIC PANEL WITH GFR
Anion gap: 8 (ref 5–15)
BUN: 23 mg/dL (ref 8–23)
CO2: 22 mmol/L (ref 22–32)
Calcium: 8.3 mg/dL — ABNORMAL LOW (ref 8.9–10.3)
Chloride: 102 mmol/L (ref 98–111)
Creatinine, Ser: 1.65 mg/dL — ABNORMAL HIGH (ref 0.61–1.24)
GFR, Estimated: 43 mL/min — ABNORMAL LOW (ref >60)
Glucose, Bld: 85 mg/dL (ref 70–99)
Potassium: 4 mmol/L (ref 3.5–5.1)
Sodium: 132 mmol/L — ABNORMAL LOW (ref 135–145)

## 2023-07-01 NOTE — Progress Notes (Signed)
 Subjective: First time meeting pt and his daughter. He was up in recliner and doing well. Catheter is well tolerated. NAEON. Discussed the case, plan, and follow up with them both. All questions were answered to their satisfaction.  Objective: Vital signs in last 24 hours: Temp:  [98.3 F (36.8 C)-98.6 F (37 C)] 98.3 F (36.8 C) (04/28 0500) Pulse Rate:  [80-87] 80 (04/28 0500) Resp:  [16-18] 18 (04/28 0500) BP: (103-108)/(65-73) 103/65 (04/28 0500) SpO2:  [94 %-97 %] 94 % (04/28 0500)  Assessment/Plan: #urinary retention #foley malfunction #BPH #AKI S/p PAE 06/26/23 w/ Dr. Jinx Mourning Urinary retention, failed catheter, now with 59f coude hematuria 3-way foley. Draining well.  SCr more or less at this baseline.  Follow up within 2 weeks for TOV Continue flomax  OK to discharge from a urologic perspective  Intake/Output from previous day: 04/27 0701 - 04/28 0700 In: 1305.1 [P.O.:720; I.V.:515.1] Out: 2170 [Urine:2170]  Intake/Output this shift: Total I/O In: -  Out: 100 [Urine:100]  Physical Exam:  General: Alert and oriented. Hard of hearing CV: No cyanosis Lungs: equal chest rise Abdomen: Soft, NTND, no rebound or guarding Gu: 3-way foley in place draining clear yellow urine with some larger pieces of sediment in tubing.  Lab Results: Recent Labs    06/29/23 1208 06/30/23 0843  HGB 13.6 13.1  HCT 40.5 40.5   BMET Recent Labs    06/30/23 0843 07/01/23 0538  NA 136 132*  K 4.1 4.0  CL 104 102  CO2 23 22  GLUCOSE 89 85  BUN 27* 23  CREATININE 1.93* 1.65*  CALCIUM  8.7* 8.3*     Studies/Results: CT ABDOMEN PELVIS WO CONTRAST Result Date: 06/29/2023 CLINICAL DATA:  Generalized abdominal pain. Prostate embolization 3 days ago. History of colon resections. EXAM: CT ABDOMEN AND PELVIS WITHOUT CONTRAST TECHNIQUE: Multidetector CT imaging of the abdomen and pelvis was performed following the standard protocol without IV contrast. RADIATION DOSE REDUCTION:  This exam was performed according to the departmental dose-optimization program which includes automated exposure control, adjustment of the mA and/or kV according to patient size and/or use of iterative reconstruction technique. COMPARISON:  08/04/2022 FINDINGS: Lower chest: Left base scarring laterally. Normal heart size without pericardial or pleural effusion. Aortic valve calcification. Hepatobiliary: Cholecystectomy. Normal noncontrast appearance of the liver, without biliary duct dilatation. Pancreas: Normal, without mass or ductal dilatation. 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 Adrenals/Urinary Tract: Mild right adrenal thickening and nodularity are unchanged. Normal left adrenal gland. Mild renal cortical thinning bilaterally.  No renal calculi. 1.9 cm interpolar left renal lesion measures 31 HU on 37/2 and is similar in size to the prior exam. When compared to a contrast enhanced exam of 10/26/2021, this is most consistent with a hemorrhagic/proteinaceous cyst. Ureters are mildly prominent proximally, likely related to bladder distension. Mild left and minimal right-sided caliectasis. The bladder is mild-to-moderately distended, without stone. Stomach/Bowel: Normal stomach, without wall thickening. Scattered colonic diverticula. The colon is primarily fluid-filled and mildly dilated. Transitions are identified within the upper and lower sigmoid including on 43/2 and 61/2. No obstructive mass. Surgical changes at the ileocecal junction. No small bowel dilatation. Vascular/Lymphatic: Aortic atherosclerosis. No abdominopelvic adenopathy. Reproductive: Moderate to marked prostatomegaly. Other: No significant free fluid. Bilateral, right larger than left fat containing inguinal hernias. Multiple pole foci of fat containing ventral abdominal wall laxity and hernia. Musculoskeletal: Schmorl's nodes within the lumbar spine. IMPRESSION: 1. Prostatomegaly with  mild to moderate bladder distension. Minimal hydroureteronephrosis  bilaterally, likely secondary. 2. Diffuse colonic dilatation, favored postprocedure adynamic ileus. There are areas of mild proximal and distal sigmoid transition from dilated to more normal caliber, without obstructive mass. 3. Aortic valvular calcifications. Consider echocardiography to evaluate for valvular dysfunction. 4. Fat containing areas of ventral abdominal wall hernia and laxity. 5. Left renal minimally complex 1.9 cm cyst does not warrant specific imaging follow-up. 6.  Aortic Atherosclerosis (ICD10-I70.0). Electronically Signed   By: Lore Rode M.D.   On: 06/29/2023 13:42      LOS: 2 days   Alla Ar, NP [facility] Pager: [phone]  07/01/2023, 9:46 AM

## 2023-07-01 NOTE — Discharge Summary (Signed)
 Physician Discharge Summary   Patient: Bruce [name] MRN: [id number] DOB: [birth year]  Admit date:     06/29/2023  Discharge date: 07/01/23  Discharge Physician: Aneita Keens, MD   PCP: Almira Jaeger, MD   Recommendations at discharge:  PCP visit for hospital follow-up Urology visit for voiding trial  Discharge Diagnoses: Principal Problem:   AKI (acute kidney injury) [facility]) Active Problems:   BPH with obstruction/lower urinary tract symptoms   Urinary retention   Gross hematuria  Resolved Problems:   * No resolved hospital problems. *  Hospital Course: Bruce [name] is a 74 y.o. male with a history of Crohn's disease, DVT, hypertension, nonischemic cardiomyopathy, BPH status post recent prostate artery embolization.  Patient presented secondary to abdominal pain and constipation with evidence of adynamic ileus and acute urinary retention on admission.  Patient managed with clear liquid diet initially in addition to IV fluids for supportive care.  Urology was consulted for evidence of acute urinary retention with poor urine output from Foley catheter.  Foley catheter was exchanged on 4/27.  Hospitalization complicated by associated AKI which improved with foley replacement.  Assessment and Plan:  Adynamic ileus Associated abdominal pain, distention and lack of bowel movements. Symptoms, however, possibly related to urinary retention. Patient with multiple bowel movements today. If ileus present on admission, it appears to have resolved.  Acute urinary retention Present on admission secondary to blood clots. Urology consulted on 4/27 and replaced patient's foley catheter. Retention appears to be resolved at this time; continuous bladder irrigation deferred for now. Foley continues to show good function. Recommendation from urology for voiding trial as an outpatient.  AKI on CKD stage IIIa Baseline creatinine of 1.4-1.5. Creatinine of 3.36 on admission. Secondary to  obstruction. Patient started on IV fluids. Acute urinary retention addressed. Creatinine down to 1.65 on day of discharge. Discontinue magnesium containing medications.  BPH with urinary retention Bilateral hydronephrosis Patient is s/p prostate artery embolization prior to admission; foley in place on admission and exchanged during admission.  Nonischemic cardiomyopathy Stable. Continue home aspirin , Coreg  and Ranexa.  Hypothyroidism Continue Synthroid .   Consultants:  Urology   Procedures:  Foley catheter placement  Disposition: Home Diet recommendation: Cardiac diet   DISCHARGE MEDICATION: Allergies as of 07/01/2023       Reactions   Lisinopril  Cough   Coconut Flavoring Agent (non-screening) Other (See Comments)   Does not eat because of crohn's    Food    Nuts-crohn's flares   Benzodiazepines Other (See Comments)   Per daughter, benzo's make dementia worse and really hard to come down off these, not a true allergy but would rather not take them if avoidable.         Medication List     PAUSE taking these medications    losartan  25 MG tablet Wait to take this until your doctor or other care provider tells you to start again. Commonly known as: COZAAR  Take 1 tablet (25 mg total) by mouth in the morning.       STOP taking these medications    MAGNESIUM CITRATE PO   Magnesium Gluconate 250 MG Tabs   methylPREDNISolone  4 MG Tbpk tablet Commonly known as: MEDROL  DOSEPAK   phenazopyridine  100 MG tablet Commonly known as: PYRIDIUM        TAKE these medications    acetaminophen  500 MG tablet Commonly known as: TYLENOL  Take 1 tablet (500 mg total) by mouth every 6 (six) hours as needed.   aspirin  81  MG chewable tablet Chew 81 mg by mouth in the morning.   carvedilol  12.5 MG tablet Commonly known as: COREG  TAKE 1 TABLET (12.5MG  TOTAL) BY MOUTH TWICE A DAY WITH MEALS What changed: See the new instructions.   ciprofloxacin  500 MG tablet Commonly  known as: Cipro  Take 1 tablet (500 mg total) by mouth 2 (two) times daily for 7 days.   GARLIC PO Take 1 capsule by mouth 3 (three) times daily before meals.   Gemtesa 75 MG Tabs Generic drug: Vibegron Take 75 mg by mouth at bedtime.   levothyroxine  200 MCG tablet Commonly known as: SYNTHROID  TAKE 1 TABLET (200 MCG TOTAL) BY MOUTH DAILY BEFORE BREAKFAST.   pantoprazole  40 MG tablet Commonly known as: PROTONIX  Take 1 tablet (40 mg total) by mouth daily. What changed:  when to take this reasons to take this   polyethylene glycol 17 g packet Commonly known as: MIRALAX  / GLYCOLAX  Take 17 g by mouth daily as needed (constipation.).   potassium gluconate 595 (99 K) MG Tabs tablet Take 595 mg by mouth in the morning.   ranolazine 500 MG 12 hr tablet Commonly known as: RANEXA Take 500 mg by mouth 2 (two) times daily.   senna 8.6 MG Tabs tablet Commonly known as: SENOKOT Take 1 tablet by mouth daily as needed for mild constipation.   solifenacin  5 MG tablet Commonly known as: VESICARE  Take 1 tablet (5 mg total) by mouth daily for 7 days.   Systane 0.4-0.3 % Soln Generic drug: Polyethyl Glycol-Propyl Glycol Place 1-2 drops into both eyes at bedtime.   tamsulosin  0.4 MG Caps capsule Commonly known as: FLOMAX  TAKE 1 CAPSULE BY MOUTH TWICE A DAY   VITAMIN B12 PO Take 1 tablet by mouth in the morning.        Follow-up Information     Almira Jaeger, MD. Schedule an appointment as soon as possible for a visit in 1 week(s).   Specialty: Family Medicine Why: For hospital follow-up Contact information: [address] [place] [address] [phone]         Samson Croak, MD. Schedule an appointment as soon as possible for a visit in 2 week(s).   Specialty: Urology Why: For hospital follow-up. Voiding trial Contact information: [address] [place] [address] [phone]                Discharge Exam: BP 103/65 (BP  Location: Left Arm)   Pulse 80   Temp 98.3 F (36.8 C) (Oral)   Resp 18   Ht 5\' 11"  (1.803 m)   Wt 104.3 kg   SpO2 94%   BMI 32.08 kg/m   General exam: Appears calm and comfortable Respiratory system: Clear to auscultation. Respiratory effort normal. Cardiovascular system: S1 & S2 heard, RRR. No murmurs, rubs, gallops or clicks. Gastrointestinal system: Abdomen is nondistended, soft and nontender. Normal bowel sounds heard. Central nervous system: Alert and oriented. No focal neurological deficits. Musculoskeletal: No edema. No calf tenderness Psychiatry: Judgement and insight appear normal. Mood & affect appropriate.   Condition at discharge: stable  The results of significant diagnostics from this hospitalization (including imaging, microbiology, ancillary and laboratory) are listed below for reference.   Imaging Studies: CT ABDOMEN PELVIS WO CONTRAST Result Date: 06/29/2023 CLINICAL DATA:  Generalized abdominal pain. Prostate embolization 3 days ago. History of colon resections. EXAM: CT ABDOMEN AND PELVIS WITHOUT CONTRAST TECHNIQUE: Multidetector CT imaging of the abdomen and pelvis was performed following the standard  protocol without IV contrast. RADIATION DOSE REDUCTION: This exam was performed according to the departmental dose-optimization program which includes automated exposure control, adjustment of the mA and/or kV according to patient size and/or use of iterative reconstruction technique. COMPARISON:  08/04/2022 FINDINGS: Lower chest: Left base scarring laterally. Normal heart size without pericardial or pleural effusion. Aortic valve calcification. Hepatobiliary: Cholecystectomy. Normal noncontrast appearance of the liver, without biliary duct dilatation. Pancreas: Normal, without mass or ductal dilatation. 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 Adrenals/Urinary Tract: Mild right adrenal thickening and nodularity are  unchanged. Normal left adrenal gland. Mild renal cortical thinning bilaterally.  No renal calculi. 1.9 cm interpolar left renal lesion measures 31 HU on 37/2 and is similar in size to the prior exam. When compared to a contrast enhanced exam of 10/26/2021, this is most consistent with a hemorrhagic/proteinaceous cyst. Ureters are mildly prominent proximally, likely related to bladder distension. Mild left and minimal right-sided caliectasis. The bladder is mild-to-moderately distended, without stone. Stomach/Bowel: Normal stomach, without wall thickening. Scattered colonic diverticula. The colon is primarily fluid-filled and mildly dilated. Transitions are identified within the upper and lower sigmoid including on 43/2 and 61/2. No obstructive mass. Surgical changes at the ileocecal junction. No small bowel dilatation. Vascular/Lymphatic: Aortic atherosclerosis. No abdominopelvic adenopathy. Reproductive: Moderate to marked prostatomegaly. Other: No significant free fluid. Bilateral, right larger than left fat containing inguinal hernias. Multiple pole foci of fat containing ventral abdominal wall laxity and hernia. Musculoskeletal: Schmorl's nodes within the lumbar spine. IMPRESSION: 1. Prostatomegaly with mild to moderate bladder distension. Minimal hydroureteronephrosis bilaterally, likely secondary. 2. Diffuse colonic dilatation, favored postprocedure adynamic ileus. There are areas of mild proximal and distal sigmoid transition from dilated to more normal caliber, without obstructive mass. 3. Aortic valvular calcifications. Consider echocardiography to evaluate for valvular dysfunction. 4. Fat containing areas of ventral abdominal wall hernia and laxity. 5. Left renal minimally complex 1.9 cm cyst does not warrant specific imaging follow-up. 6.  Aortic Atherosclerosis (ICD10-I70.0). Electronically Signed   By: Lore Rode M.D.   On: 06/29/2023 13:42   IR EMBO ARTERIAL NOT HEMORR HEMANG INC GUIDE  ROADMAPPING Result Date: 06/26/2023 INDICATION: 73 year old male with history of benign prostatic hyperplasia (245 g) in severe lower urinary tract symptoms (IPSS-QoL 21/5). EXAM: 1. Ultrasound-guided vascular access of the left radial artery. 2. Selective catheterization and angiography of the left internal iliac artery, left internal pudendal artery, left prostatic artery, right internal iliac artery, right internal pudendal artery, and right prostatic artery. 3. Cone beam CT. 4. Bilateral prostatic artery embolization. MEDICATIONS: Ciprofloxacin  400 mg IV. The antibiotic was administered within 1 hour of the procedure ANESTHESIA/SEDATION: The patient received monitored anesthesia care under the direct supervision of the Department of Anesthesia. CONTRAST:  10mL VISIPAQUE  IODIXANOL  320 MG/ML IV SOLN, 50mL VISIPAQUE  IODIXANOL  320 MG/ML IV SOLN, 20mL VISIPAQUE  IODIXANOL  320 MG/ML IV SOLN FLUOROSCOPY: Radiation Exposure Index (as provided by the fluoroscopic device): 1,548 mGy Kerma COMPLICATIONS: None immediate. PROCEDURE: Informed consent was obtained from the patient following explanation of the procedure, risks, benefits and alternatives. The patient understands, agrees and consents for the procedure. All questions were addressed. A time out was performed prior to the initiation of the procedure. Maximal barrier sterile technique utilized including caps, mask, sterile gowns, sterile gloves, large sterile drape, hand hygiene, and Betadine prep. The left wrist was prepped and draped in standard fashion. Pulse oximeter was attached to the left thumb. Art Largo test was performed, grade A. The left radial artery  measured 0.3 cm in diameter. Subdermal Local anesthesia was provided at the planned needle entry site with 1% lidocaine . A small skin nick was made. Under direct ultrasound visualization, the left radial artery was punctured with a 21 gauge micropuncture needle. A permanent image was captured and stored in the  record. A microwire was placed and exchanged for a 4/5 French slender sheath. The sheath was flushed followed by installation of standard radial cocktail. An MG2 5 French catheter and Bentson wire were directed to the level of the aortic arch. There was difficulty cannulation due to tortuosity of the aortic arch. Therefore, a will a wire was inserted and the catheter was exchanged for a 100 cm pigtail catheter through which a Wholey wire was introduced into the descending thoracic aorta. The catheter was exchanged for the MG2 catheter was first directed under fluoroscopic guidance to the pelvis into the left internal iliac artery. Left internal iliac angiogram was performed which demonstrated conventional and anatomy and patency. The prostatic artery was visualized arising from the proximal internal pudendal artery. A Glidewire was inserted to direct the catheter into the proximal internal pudendal artery. Angiogram was performed which demonstrated patency of the internal pudendal artery with a shared vesiculoprostatic branch arising from the proximal portion. The vesiculoprostatic branch was catheterized with a 1.9 [place] Progreat lambda triple angled microcatheter and Aristotle 14 standard microwire. An angiogram was performed demonstrated patency of the vessels with more superior branches supplying the bladder and inferior branches supplying the prostate gland. The prostatic branch was then selected and angiogram was performed demonstrated opacification of the left hemi prostate. No evidence of nontarget branch vessels. Kumpe beam CT was performed at this location which confirmed left hemi prostatic supply. One hundred mcg of nitroglycerin  was administered intra-arterially followed by embolization with 400 micron Hydropeals until hemostasis was achieved. "Perfected" technique was employed. The catheter was retracted and completion prostatic angiogram was performed demonstrating complete opacification of the left  hemi prostate. The microcatheter was removed. The base catheter was retracted to the distal abdominal aorta in used to select the right internal iliac artery with the assistance of a Glidewire. Internal iliac angiogram was performed which demonstrated patency of the anterior posterior divisions. The prostatic artery was visualized arising from the proximal internal pudendal artery and inferior gluteal artery shared trunk where the catheter tip was advanced. Repeat angiogram demonstrated origination of a vesiculoprostatic branch arising from the proximal trick trunk. The branch was difficult to select with the previously used microcatheter and wire, therefore, the catheter was exchanged over a transcend floppy wire for a 2.4 [place] Swift ninja microcatheter. The vesicular prostatic branch was then selected. The wire was inserted in the prostatic branch was then selected. Dedicated prostatic artery angiogram was performed which demonstrated opacification of the right hemi prostate. Cone beam CT was performed in this location which confirmed opacification of the right hemi prostate without evidence of nontarget branch vessels. 100 mcg of nitroglycerin  was administered intra-arterially at this location followed by embolization with 400 micron Hydropearls until hemostasis was achieved. The catheter was retracted slightly and completion angiogram demonstrated complete embolization of the right hemi prostate. The catheters were removed. A TR band was applied to the left wrist in the sheath was removed. Hemostasis was achieved. The patient tolerated the procedure well was transferred to the recovery area in good condition. IMPRESSION: Technically successful bilateral prostatic artery embolization. Creasie Doctor, MD [facility] Radiology Electronically Signed   By: Jolaine Nasuti.D.  On: 06/26/2023 18:30   IR US  Guide Vasc Access Left Result Date: 06/26/2023 INDICATION:  73 year old male with history of benign prostatic hyperplasia (245 g) in severe lower urinary tract symptoms (IPSS-QoL 21/5). EXAM: 1. Ultrasound-guided vascular access of the left radial artery. 2. Selective catheterization and angiography of the left internal iliac artery, left internal pudendal artery, left prostatic artery, right internal iliac artery, right internal pudendal artery, and right prostatic artery. 3. Cone beam CT. 4. Bilateral prostatic artery embolization. MEDICATIONS: Ciprofloxacin  400 mg IV. The antibiotic was administered within 1 hour of the procedure ANESTHESIA/SEDATION: The patient received monitored anesthesia care under the direct supervision of the Department of Anesthesia. CONTRAST:  10mL VISIPAQUE  IODIXANOL  320 MG/ML IV SOLN, 50mL VISIPAQUE  IODIXANOL  320 MG/ML IV SOLN, 20mL VISIPAQUE  IODIXANOL  320 MG/ML IV SOLN FLUOROSCOPY: Radiation Exposure Index (as provided by the fluoroscopic device): 1,548 mGy Kerma COMPLICATIONS: None immediate. PROCEDURE: Informed consent was obtained from the patient following explanation of the procedure, risks, benefits and alternatives. The patient understands, agrees and consents for the procedure. All questions were addressed. A time out was performed prior to the initiation of the procedure. Maximal barrier sterile technique utilized including caps, mask, sterile gowns, sterile gloves, large sterile drape, hand hygiene, and Betadine prep. The left wrist was prepped and draped in standard fashion. Pulse oximeter was attached to the left thumb. Art Largo test was performed, grade A. The left radial artery measured 0.3 cm in diameter. Subdermal Local anesthesia was provided at the planned needle entry site with 1% lidocaine . A small skin nick was made. Under direct ultrasound visualization, the left radial artery was punctured with a 21 gauge micropuncture needle. A permanent image was captured and stored in the record. A microwire was placed and exchanged for  a 4/5 French slender sheath. The sheath was flushed followed by installation of standard radial cocktail. An MG2 5 French catheter and Bentson wire were directed to the level of the aortic arch. There was difficulty cannulation due to tortuosity of the aortic arch. Therefore, a will a wire was inserted and the catheter was exchanged for a 100 cm pigtail catheter through which a Wholey wire was introduced into the descending thoracic aorta. The catheter was exchanged for the MG2 catheter was first directed under fluoroscopic guidance to the pelvis into the left internal iliac artery. Left internal iliac angiogram was performed which demonstrated conventional and anatomy and patency. The prostatic artery was visualized arising from the proximal internal pudendal artery. A Glidewire was inserted to direct the catheter into the proximal internal pudendal artery. Angiogram was performed which demonstrated patency of the internal pudendal artery with a shared vesiculoprostatic branch arising from the proximal portion. The vesiculoprostatic branch was catheterized with a 1.9 [place] Progreat lambda triple angled microcatheter and Aristotle 14 standard microwire. An angiogram was performed demonstrated patency of the vessels with more superior branches supplying the bladder and inferior branches supplying the prostate gland. The prostatic branch was then selected and angiogram was performed demonstrated opacification of the left hemi prostate. No evidence of nontarget branch vessels. Kumpe beam CT was performed at this location which confirmed left hemi prostatic supply. One hundred mcg of nitroglycerin  was administered intra-arterially followed by embolization with 400 micron Hydropeals until hemostasis was achieved. "Perfected" technique was employed. The catheter was retracted and completion prostatic angiogram was performed demonstrating complete opacification of the left hemi prostate. The microcatheter was removed. The  base catheter was retracted to the distal abdominal aorta in used to select the right  internal iliac artery with the assistance of a Glidewire. Internal iliac angiogram was performed which demonstrated patency of the anterior posterior divisions. The prostatic artery was visualized arising from the proximal internal pudendal artery and inferior gluteal artery shared trunk where the catheter tip was advanced. Repeat angiogram demonstrated origination of a vesiculoprostatic branch arising from the proximal trick trunk. The branch was difficult to select with the previously used microcatheter and wire, therefore, the catheter was exchanged over a transcend floppy wire for a 2.4 [place] Swift ninja microcatheter. The vesicular prostatic branch was then selected. The wire was inserted in the prostatic branch was then selected. Dedicated prostatic artery angiogram was performed which demonstrated opacification of the right hemi prostate. Cone beam CT was performed in this location which confirmed opacification of the right hemi prostate without evidence of nontarget branch vessels. 100 mcg of nitroglycerin  was administered intra-arterially at this location followed by embolization with 400 micron Hydropearls until hemostasis was achieved. The catheter was retracted slightly and completion angiogram demonstrated complete embolization of the right hemi prostate. The catheters were removed. A TR band was applied to the left wrist in the sheath was removed. Hemostasis was achieved. The patient tolerated the procedure well was transferred to the recovery area in good condition. IMPRESSION: Technically successful bilateral prostatic artery embolization. Creasie Doctor, MD [facility] Radiology Electronically Signed   By: Creasie Doctor M.D.   On: 06/26/2023 18:30   IR CT PELVIS W/CM Result Date: 06/26/2023 INDICATION: 73 year old male with history of benign prostatic hyperplasia  (245 g) in severe lower urinary tract symptoms (IPSS-QoL 21/5). EXAM: 1. Ultrasound-guided vascular access of the left radial artery. 2. Selective catheterization and angiography of the left internal iliac artery, left internal pudendal artery, left prostatic artery, right internal iliac artery, right internal pudendal artery, and right prostatic artery. 3. Cone beam CT. 4. Bilateral prostatic artery embolization. MEDICATIONS: Ciprofloxacin  400 mg IV. The antibiotic was administered within 1 hour of the procedure ANESTHESIA/SEDATION: The patient received monitored anesthesia care under the direct supervision of the Department of Anesthesia. CONTRAST:  10mL VISIPAQUE  IODIXANOL  320 MG/ML IV SOLN, 50mL VISIPAQUE  IODIXANOL  320 MG/ML IV SOLN, 20mL VISIPAQUE  IODIXANOL  320 MG/ML IV SOLN FLUOROSCOPY: Radiation Exposure Index (as provided by the fluoroscopic device): 1,548 mGy Kerma COMPLICATIONS: None immediate. PROCEDURE: Informed consent was obtained from the patient following explanation of the procedure, risks, benefits and alternatives. The patient understands, agrees and consents for the procedure. All questions were addressed. A time out was performed prior to the initiation of the procedure. Maximal barrier sterile technique utilized including caps, mask, sterile gowns, sterile gloves, large sterile drape, hand hygiene, and Betadine prep. The left wrist was prepped and draped in standard fashion. Pulse oximeter was attached to the left thumb. Art Largo test was performed, grade A. The left radial artery measured 0.3 cm in diameter. Subdermal Local anesthesia was provided at the planned needle entry site with 1% lidocaine . A small skin nick was made. Under direct ultrasound visualization, the left radial artery was punctured with a 21 gauge micropuncture needle. A permanent image was captured and stored in the record. A microwire was placed and exchanged for a 4/5 French slender sheath. The sheath was flushed followed  by installation of standard radial cocktail. An MG2 5 French catheter and Bentson wire were directed to the level of the aortic arch. There was difficulty cannulation due to tortuosity of the aortic arch. Therefore, a will a wire was inserted and the catheter was  exchanged for a 100 cm pigtail catheter through which a Wholey wire was introduced into the descending thoracic aorta. The catheter was exchanged for the MG2 catheter was first directed under fluoroscopic guidance to the pelvis into the left internal iliac artery. Left internal iliac angiogram was performed which demonstrated conventional and anatomy and patency. The prostatic artery was visualized arising from the proximal internal pudendal artery. A Glidewire was inserted to direct the catheter into the proximal internal pudendal artery. Angiogram was performed which demonstrated patency of the internal pudendal artery with a shared vesiculoprostatic branch arising from the proximal portion. The vesiculoprostatic branch was catheterized with a 1.9 [place] Progreat lambda triple angled microcatheter and Aristotle 14 standard microwire. An angiogram was performed demonstrated patency of the vessels with more superior branches supplying the bladder and inferior branches supplying the prostate gland. The prostatic branch was then selected and angiogram was performed demonstrated opacification of the left hemi prostate. No evidence of nontarget branch vessels. Kumpe beam CT was performed at this location which confirmed left hemi prostatic supply. One hundred mcg of nitroglycerin  was administered intra-arterially followed by embolization with 400 micron Hydropeals until hemostasis was achieved. "Perfected" technique was employed. The catheter was retracted and completion prostatic angiogram was performed demonstrating complete opacification of the left hemi prostate. The microcatheter was removed. The base catheter was retracted to the distal abdominal aorta in  used to select the right internal iliac artery with the assistance of a Glidewire. Internal iliac angiogram was performed which demonstrated patency of the anterior posterior divisions. The prostatic artery was visualized arising from the proximal internal pudendal artery and inferior gluteal artery shared trunk where the catheter tip was advanced. Repeat angiogram demonstrated origination of a vesiculoprostatic branch arising from the proximal trick trunk. The branch was difficult to select with the previously used microcatheter and wire, therefore, the catheter was exchanged over a transcend floppy wire for a 2.4 [place] Swift ninja microcatheter. The vesicular prostatic branch was then selected. The wire was inserted in the prostatic branch was then selected. Dedicated prostatic artery angiogram was performed which demonstrated opacification of the right hemi prostate. Cone beam CT was performed in this location which confirmed opacification of the right hemi prostate without evidence of nontarget branch vessels. 100 mcg of nitroglycerin  was administered intra-arterially at this location followed by embolization with 400 micron Hydropearls until hemostasis was achieved. The catheter was retracted slightly and completion angiogram demonstrated complete embolization of the right hemi prostate. The catheters were removed. A TR band was applied to the left wrist in the sheath was removed. Hemostasis was achieved. The patient tolerated the procedure well was transferred to the recovery area in good condition. IMPRESSION: Technically successful bilateral prostatic artery embolization. Creasie Doctor, MD [facility] Radiology Electronically Signed   By: Creasie Doctor M.D.   On: 06/26/2023 18:30   IR CT PELVIS W/CM Result Date: 06/26/2023 INDICATION: 73 year old male with history of benign prostatic hyperplasia (245 g) in severe lower urinary tract symptoms (IPSS-QoL  21/5). EXAM: 1. Ultrasound-guided vascular access of the left radial artery. 2. Selective catheterization and angiography of the left internal iliac artery, left internal pudendal artery, left prostatic artery, right internal iliac artery, right internal pudendal artery, and right prostatic artery. 3. Cone beam CT. 4. Bilateral prostatic artery embolization. MEDICATIONS: Ciprofloxacin  400 mg IV. The antibiotic was administered within 1 hour of the procedure ANESTHESIA/SEDATION: The patient received monitored anesthesia care under the direct supervision of the Department of Anesthesia.  CONTRAST:  10mL VISIPAQUE  IODIXANOL  320 MG/ML IV SOLN, 50mL VISIPAQUE  IODIXANOL  320 MG/ML IV SOLN, 20mL VISIPAQUE  IODIXANOL  320 MG/ML IV SOLN FLUOROSCOPY: Radiation Exposure Index (as provided by the fluoroscopic device): 1,548 mGy Kerma COMPLICATIONS: None immediate. PROCEDURE: Informed consent was obtained from the patient following explanation of the procedure, risks, benefits and alternatives. The patient understands, agrees and consents for the procedure. All questions were addressed. A time out was performed prior to the initiation of the procedure. Maximal barrier sterile technique utilized including caps, mask, sterile gowns, sterile gloves, large sterile drape, hand hygiene, and Betadine prep. The left wrist was prepped and draped in standard fashion. Pulse oximeter was attached to the left thumb. Art Largo test was performed, grade A. The left radial artery measured 0.3 cm in diameter. Subdermal Local anesthesia was provided at the planned needle entry site with 1% lidocaine . A small skin nick was made. Under direct ultrasound visualization, the left radial artery was punctured with a 21 gauge micropuncture needle. A permanent image was captured and stored in the record. A microwire was placed and exchanged for a 4/5 French slender sheath. The sheath was flushed followed by installation of standard radial cocktail. An MG2 5  French catheter and Bentson wire were directed to the level of the aortic arch. There was difficulty cannulation due to tortuosity of the aortic arch. Therefore, a will a wire was inserted and the catheter was exchanged for a 100 cm pigtail catheter through which a Wholey wire was introduced into the descending thoracic aorta. The catheter was exchanged for the MG2 catheter was first directed under fluoroscopic guidance to the pelvis into the left internal iliac artery. Left internal iliac angiogram was performed which demonstrated conventional and anatomy and patency. The prostatic artery was visualized arising from the proximal internal pudendal artery. A Glidewire was inserted to direct the catheter into the proximal internal pudendal artery. Angiogram was performed which demonstrated patency of the internal pudendal artery with a shared vesiculoprostatic branch arising from the proximal portion. The vesiculoprostatic branch was catheterized with a 1.9 [place] Progreat lambda triple angled microcatheter and Aristotle 14 standard microwire. An angiogram was performed demonstrated patency of the vessels with more superior branches supplying the bladder and inferior branches supplying the prostate gland. The prostatic branch was then selected and angiogram was performed demonstrated opacification of the left hemi prostate. No evidence of nontarget branch vessels. Kumpe beam CT was performed at this location which confirmed left hemi prostatic supply. One hundred mcg of nitroglycerin  was administered intra-arterially followed by embolization with 400 micron Hydropeals until hemostasis was achieved. "Perfected" technique was employed. The catheter was retracted and completion prostatic angiogram was performed demonstrating complete opacification of the left hemi prostate. The microcatheter was removed. The base catheter was retracted to the distal abdominal aorta in used to select the right internal iliac artery with  the assistance of a Glidewire. Internal iliac angiogram was performed which demonstrated patency of the anterior posterior divisions. The prostatic artery was visualized arising from the proximal internal pudendal artery and inferior gluteal artery shared trunk where the catheter tip was advanced. Repeat angiogram demonstrated origination of a vesiculoprostatic branch arising from the proximal trick trunk. The branch was difficult to select with the previously used microcatheter and wire, therefore, the catheter was exchanged over a transcend floppy wire for a 2.4 [place] Swift ninja microcatheter. The vesicular prostatic branch was then selected. The wire was inserted in the prostatic branch was then selected. Dedicated prostatic artery angiogram was performed  which demonstrated opacification of the right hemi prostate. Cone beam CT was performed in this location which confirmed opacification of the right hemi prostate without evidence of nontarget branch vessels. 100 mcg of nitroglycerin  was administered intra-arterially at this location followed by embolization with 400 micron Hydropearls until hemostasis was achieved. The catheter was retracted slightly and completion angiogram demonstrated complete embolization of the right hemi prostate. The catheters were removed. A TR band was applied to the left wrist in the sheath was removed. Hemostasis was achieved. The patient tolerated the procedure well was transferred to the recovery area in good condition. IMPRESSION: Technically successful bilateral prostatic artery embolization. Creasie Doctor, MD [facility] Radiology Electronically Signed   By: Creasie Doctor M.D.   On: 06/26/2023 18:30   IR Angiogram Pelvis Selective Or Supraselective Result Date: 06/26/2023 INDICATION: 73 year old male with history of benign prostatic hyperplasia (245 g) in severe lower urinary tract symptoms (IPSS-QoL 21/5). EXAM: 1.  Ultrasound-guided vascular access of the left radial artery. 2. Selective catheterization and angiography of the left internal iliac artery, left internal pudendal artery, left prostatic artery, right internal iliac artery, right internal pudendal artery, and right prostatic artery. 3. Cone beam CT. 4. Bilateral prostatic artery embolization. MEDICATIONS: Ciprofloxacin  400 mg IV. The antibiotic was administered within 1 hour of the procedure ANESTHESIA/SEDATION: The patient received monitored anesthesia care under the direct supervision of the Department of Anesthesia. CONTRAST:  10mL VISIPAQUE  IODIXANOL  320 MG/ML IV SOLN, 50mL VISIPAQUE  IODIXANOL  320 MG/ML IV SOLN, 20mL VISIPAQUE  IODIXANOL  320 MG/ML IV SOLN FLUOROSCOPY: Radiation Exposure Index (as provided by the fluoroscopic device): 1,548 mGy Kerma COMPLICATIONS: None immediate. PROCEDURE: Informed consent was obtained from the patient following explanation of the procedure, risks, benefits and alternatives. The patient understands, agrees and consents for the procedure. All questions were addressed. A time out was performed prior to the initiation of the procedure. Maximal barrier sterile technique utilized including caps, mask, sterile gowns, sterile gloves, large sterile drape, hand hygiene, and Betadine prep. The left wrist was prepped and draped in standard fashion. Pulse oximeter was attached to the left thumb. Art Largo test was performed, grade A. The left radial artery measured 0.3 cm in diameter. Subdermal Local anesthesia was provided at the planned needle entry site with 1% lidocaine . A small skin nick was made. Under direct ultrasound visualization, the left radial artery was punctured with a 21 gauge micropuncture needle. A permanent image was captured and stored in the record. A microwire was placed and exchanged for a 4/5 French slender sheath. The sheath was flushed followed by installation of standard radial cocktail. An MG2 5 French catheter and  Bentson wire were directed to the level of the aortic arch. There was difficulty cannulation due to tortuosity of the aortic arch. Therefore, a will a wire was inserted and the catheter was exchanged for a 100 cm pigtail catheter through which a Wholey wire was introduced into the descending thoracic aorta. The catheter was exchanged for the MG2 catheter was first directed under fluoroscopic guidance to the pelvis into the left internal iliac artery. Left internal iliac angiogram was performed which demonstrated conventional and anatomy and patency. The prostatic artery was visualized arising from the proximal internal pudendal artery. A Glidewire was inserted to direct the catheter into the proximal internal pudendal artery. Angiogram was performed which demonstrated patency of the internal pudendal artery with a shared vesiculoprostatic branch arising from the proximal portion. The vesiculoprostatic branch was catheterized with a 1.9 [place] Progreat  lambda triple angled microcatheter and Aristotle 14 standard microwire. An angiogram was performed demonstrated patency of the vessels with more superior branches supplying the bladder and inferior branches supplying the prostate gland. The prostatic branch was then selected and angiogram was performed demonstrated opacification of the left hemi prostate. No evidence of nontarget branch vessels. Kumpe beam CT was performed at this location which confirmed left hemi prostatic supply. One hundred mcg of nitroglycerin  was administered intra-arterially followed by embolization with 400 micron Hydropeals until hemostasis was achieved. "Perfected" technique was employed. The catheter was retracted and completion prostatic angiogram was performed demonstrating complete opacification of the left hemi prostate. The microcatheter was removed. The base catheter was retracted to the distal abdominal aorta in used to select the right internal iliac artery with the assistance of a  Glidewire. Internal iliac angiogram was performed which demonstrated patency of the anterior posterior divisions. The prostatic artery was visualized arising from the proximal internal pudendal artery and inferior gluteal artery shared trunk where the catheter tip was advanced. Repeat angiogram demonstrated origination of a vesiculoprostatic branch arising from the proximal trick trunk. The branch was difficult to select with the previously used microcatheter and wire, therefore, the catheter was exchanged over a transcend floppy wire for a 2.4 [place] Swift ninja microcatheter. The vesicular prostatic branch was then selected. The wire was inserted in the prostatic branch was then selected. Dedicated prostatic artery angiogram was performed which demonstrated opacification of the right hemi prostate. Cone beam CT was performed in this location which confirmed opacification of the right hemi prostate without evidence of nontarget branch vessels. 100 mcg of nitroglycerin  was administered intra-arterially at this location followed by embolization with 400 micron Hydropearls until hemostasis was achieved. The catheter was retracted slightly and completion angiogram demonstrated complete embolization of the right hemi prostate. The catheters were removed. A TR band was applied to the left wrist in the sheath was removed. Hemostasis was achieved. The patient tolerated the procedure well was transferred to the recovery area in good condition. IMPRESSION: Technically successful bilateral prostatic artery embolization. Creasie Doctor, MD [facility] Radiology Electronically Signed   By: Creasie Doctor M.D.   On: 06/26/2023 18:30   IR Angiogram Pelvis Selective Or Supraselective Result Date: 06/26/2023 INDICATION: 73 year old male with history of benign prostatic hyperplasia (245 g) in severe lower urinary tract symptoms (IPSS-QoL 21/5). EXAM: 1. Ultrasound-guided vascular  access of the left radial artery. 2. Selective catheterization and angiography of the left internal iliac artery, left internal pudendal artery, left prostatic artery, right internal iliac artery, right internal pudendal artery, and right prostatic artery. 3. Cone beam CT. 4. Bilateral prostatic artery embolization. MEDICATIONS: Ciprofloxacin  400 mg IV. The antibiotic was administered within 1 hour of the procedure ANESTHESIA/SEDATION: The patient received monitored anesthesia care under the direct supervision of the Department of Anesthesia. CONTRAST:  10mL VISIPAQUE  IODIXANOL  320 MG/ML IV SOLN, 50mL VISIPAQUE  IODIXANOL  320 MG/ML IV SOLN, 20mL VISIPAQUE  IODIXANOL  320 MG/ML IV SOLN FLUOROSCOPY: Radiation Exposure Index (as provided by the fluoroscopic device): 1,548 mGy Kerma COMPLICATIONS: None immediate. PROCEDURE: Informed consent was obtained from the patient following explanation of the procedure, risks, benefits and alternatives. The patient understands, agrees and consents for the procedure. All questions were addressed. A time out was performed prior to the initiation of the procedure. Maximal barrier sterile technique utilized including caps, mask, sterile gowns, sterile gloves, large sterile drape, hand hygiene, and Betadine prep. The left wrist was prepped and draped in standard  fashion. Pulse oximeter was attached to the left thumb. Art Largo test was performed, grade A. The left radial artery measured 0.3 cm in diameter. Subdermal Local anesthesia was provided at the planned needle entry site with 1% lidocaine . A small skin nick was made. Under direct ultrasound visualization, the left radial artery was punctured with a 21 gauge micropuncture needle. A permanent image was captured and stored in the record. A microwire was placed and exchanged for a 4/5 French slender sheath. The sheath was flushed followed by installation of standard radial cocktail. An MG2 5 French catheter and Bentson wire were directed  to the level of the aortic arch. There was difficulty cannulation due to tortuosity of the aortic arch. Therefore, a will a wire was inserted and the catheter was exchanged for a 100 cm pigtail catheter through which a Wholey wire was introduced into the descending thoracic aorta. The catheter was exchanged for the MG2 catheter was first directed under fluoroscopic guidance to the pelvis into the left internal iliac artery. Left internal iliac angiogram was performed which demonstrated conventional and anatomy and patency. The prostatic artery was visualized arising from the proximal internal pudendal artery. A Glidewire was inserted to direct the catheter into the proximal internal pudendal artery. Angiogram was performed which demonstrated patency of the internal pudendal artery with a shared vesiculoprostatic branch arising from the proximal portion. The vesiculoprostatic branch was catheterized with a 1.9 [place] Progreat lambda triple angled microcatheter and Aristotle 14 standard microwire. An angiogram was performed demonstrated patency of the vessels with more superior branches supplying the bladder and inferior branches supplying the prostate gland. The prostatic branch was then selected and angiogram was performed demonstrated opacification of the left hemi prostate. No evidence of nontarget branch vessels. Kumpe beam CT was performed at this location which confirmed left hemi prostatic supply. One hundred mcg of nitroglycerin  was administered intra-arterially followed by embolization with 400 micron Hydropeals until hemostasis was achieved. "Perfected" technique was employed. The catheter was retracted and completion prostatic angiogram was performed demonstrating complete opacification of the left hemi prostate. The microcatheter was removed. The base catheter was retracted to the distal abdominal aorta in used to select the right internal iliac artery with the assistance of a Glidewire. Internal iliac  angiogram was performed which demonstrated patency of the anterior posterior divisions. The prostatic artery was visualized arising from the proximal internal pudendal artery and inferior gluteal artery shared trunk where the catheter tip was advanced. Repeat angiogram demonstrated origination of a vesiculoprostatic branch arising from the proximal trick trunk. The branch was difficult to select with the previously used microcatheter and wire, therefore, the catheter was exchanged over a transcend floppy wire for a 2.4 [place] Swift ninja microcatheter. The vesicular prostatic branch was then selected. The wire was inserted in the prostatic branch was then selected. Dedicated prostatic artery angiogram was performed which demonstrated opacification of the right hemi prostate. Cone beam CT was performed in this location which confirmed opacification of the right hemi prostate without evidence of nontarget branch vessels. 100 mcg of nitroglycerin  was administered intra-arterially at this location followed by embolization with 400 micron Hydropearls until hemostasis was achieved. The catheter was retracted slightly and completion angiogram demonstrated complete embolization of the right hemi prostate. The catheters were removed. A TR band was applied to the left wrist in the sheath was removed. Hemostasis was achieved. The patient tolerated the procedure well was transferred to the recovery area in good condition. IMPRESSION: Technically successful bilateral prostatic artery embolization.  Creasie Doctor, MD [facility] Radiology Electronically Signed   By: Creasie Doctor M.D.   On: 06/26/2023 18:30    Microbiology: Results for orders placed or performed during the hospital encounter of 06/29/23  Urine Culture     Status: None   Collection Time: 06/29/23 12:35 PM   Specimen: Urine, Random  Result Value Ref Range Status   Specimen Description   Final    URINE,  RANDOM Performed at [facility], [address] [place], [place] [address]    Special Requests   Final    NONE Reflexed from [id number] Performed at [facility], [address], [place], [place] [address]    Culture   Final    NO GROWTH Performed at [facility], [address], [place], [place] [address]    Report Status 06/30/2023 FINAL  Final    Labs: CBC: Recent Labs  Lab 06/26/23 0625 06/29/23 1208 06/30/23 0843  WBC 9.5 16.8* 12.3*  NEUTROABS  --  13.7*  --   HGB 12.4* 13.6 13.1  HCT 37.7* 40.5 40.5  MCV 88.7 85.4 90.4  PLT 387 386 353   Basic Metabolic Panel: Recent Labs  Lab 06/26/23 0625 06/29/23 1208 06/30/23 0843 07/01/23 0538  NA 138 134* 136 132*  K 4.6 4.0 4.1 4.0  CL 107 101 104 102  CO2 24 17* 23 22  GLUCOSE 99 107* 89 85  BUN 18 29* 27* 23  CREATININE 1.63* 3.36* 1.93* 1.65*  CALCIUM  8.9 9.4 8.7* 8.3*   Liver Function Tests: Recent Labs  Lab 06/29/23 1208  AST 28  ALT 14  ALKPHOS 89  BILITOT 0.7  PROT 7.1  ALBUMIN 4.0   CBG: Recent Labs  Lab 06/26/23 1212  GLUCAP 106*    Discharge time spent: 35 minutes.  Signed: Aneita Keens, MD [facility] 07/01/2023

## 2023-07-01 NOTE — Plan of Care (Signed)

## 2023-07-01 NOTE — Discharge Instructions (Signed)
 Bruce [name],  You were in the hospital with slow bowels and a urinary blockage. Your bowel movements have improved and your foley was replaced which improved your symptoms and your renal function. Please follow-up with your PCP and urologist. Please hold on taking your magnesium containing medications/supplements. Please hold on taking your losartan  since your blood pressure is low-normal and because of your renal function.

## 2023-07-01 NOTE — Progress Notes (Signed)
 Patient c/o some tenderness to penis tip, will follow up with MD to get Urojet if possible for patient to take home at discharge

## 2023-07-02 ENCOUNTER — Encounter: Payer: Medicare Other | Admitting: Family Medicine

## 2023-07-02 ENCOUNTER — Telehealth: Payer: Self-pay | Admitting: *Deleted

## 2023-07-02 NOTE — Transitions of Care (Post Inpatient/ED Visit) (Signed)
   07/02/2023  Name: Bruce [name] MRN: [id number] DOB: [birth year]  Today's TOC FU Call Status: Today's TOC FU Call Status:: Unsuccessful Call (1st Attempt) Unsuccessful Call (1st Attempt) Date: 07/02/23  Attempted to reach the patient regarding the most recent Inpatient/ED visit.  Follow Up Plan: Additional outreach attempts will be made to reach the patient to complete the Transitions of Care (Post Inpatient/ED visit) call.   Cecilie Coffee [facility], BSN RN Care Manager/ Transition of Care [place] [phone]

## 2023-07-03 ENCOUNTER — Telehealth: Payer: Self-pay | Admitting: *Deleted

## 2023-07-03 NOTE — Transitions of Care (Post Inpatient/ED Visit) (Signed)
 07/03/2023  Name: Bruce [name] MRN: [id number] DOB: February 03, 1951  Today's TOC FU Call Status: Today's TOC FU Call Status:: Successful TOC FU Call Completed TOC FU Call Complete Date: 07/03/23 Patient's Name and Date of Birth confirmed.  Transition Care Management Follow-up Telephone Call Date of Discharge: 07/01/23 Discharge Facility: [facility]) Type of Discharge: Inpatient Admission Primary Inpatient Discharge Diagnosis:: AKI (acute kidney injury) How have you been since you were released from the hospital?:  (eating, drinking well, foley catheter patent, draining well, taking medications as prescribed, requires assistance with ADL's) Any questions or concerns?: No  Items Reviewed: Did you receive and understand the discharge instructions provided?: Yes Medications obtained,verified, and reconciled?: Yes (Medications Reviewed) Any new allergies since your discharge?: No Dietary orders reviewed?: Yes Type of Diet Ordered:: heart healthy, low sodium Do you have support at home?: Yes People in Home [RPT]: alone Name of Support/Comfort Primary Source: adult daughter Aristide Sheerin and adult son check on and see pt daily Reviewed signs/ symptoms of infection, UTI Reviewed care of foley catheter  Medications Reviewed Today: Medications Reviewed Today     Reviewed by Daralyn Earl, RN (Registered Nurse) on 07/03/23 at 1352  Med List Status: <None>   Medication Order Taking? Sig Documenting Provider Last Dose Status Informant  acetaminophen  (TYLENOL ) 500 MG tablet [id number] Yes Take 1 tablet (500 mg total) by mouth every 6 (six) hours as needed. Debbra Fairy, PA-C Taking Active Child, Pharmacy Records           Med Note Karolynn Pack   NWG Jun 29, 2023  5:29 PM) Patient took 2 tylenol  on 06/29/23  aspirin  81 MG chewable tablet [id number] Yes Chew 81 mg by mouth in the morning. [provider] Taking Active Child, Pharmacy Records  carvedilol  (COREG ) 12.5 MG  tablet [id number] Yes TAKE 1 TABLET (12.5MG  TOTAL) BY MOUTH TWICE A DAY WITH MEALS  Patient taking differently: Take 12.5 mg by mouth 2 (two) times daily with a meal.   Almira Jaeger, MD Taking Active Child, Pharmacy Records           Med Note Karolynn Pack   Sat Jun 29, 2023  5:14 PM) LF: 06/07/23 for a 90ds  Cyanocobalamin  (VITAMIN B12 PO) [id number] Yes Take 1 tablet by mouth in the morning. [provider] Taking Active Child, Pharmacy Records  GARLIC PO [id number] Yes Take 1 capsule by mouth 3 (three) times daily before meals. [provider] Taking Active Child, Pharmacy Records  GEMTESA 75 MG TABS [id number] Yes Take 75 mg by mouth at bedtime. [provider] Taking Active Child, Pharmacy Records           Med Note Karolynn Pack   VZD Jun 29, 2023  5:15 PM) LF: 05/12/23 for a 30ds  levothyroxine  (SYNTHROID ) 200 MCG tablet [id number] Yes TAKE 1 TABLET (200 MCG TOTAL) BY MOUTH DAILY BEFORE BREAKFAST. Almira Jaeger, MD Taking Active Child, Pharmacy Records  losartan  (COZAAR ) 25 MG tablet [id number] No Take 1 tablet (25 mg total) by mouth in the morning.  Patient not taking: Reported on 07/03/2023   Almira Jaeger, MD Not Taking Active Child, Pharmacy Records  pantoprazole  (PROTONIX ) 40 MG tablet [id number] Yes Take 1 tablet (40 mg total) by mouth daily.  Patient taking differently: Take 40 mg by mouth daily as needed (indigestion/heartburn.).   Pyrtle, Amber Bail, MD Taking Active Child, Pharmacy Records  Polyethyl Glycol-Propyl Glycol (SYSTANE) 0.4-0.3 % SOLN [id number] Yes Place  1-2 drops into both eyes at bedtime. [provider] Taking Active Child, Pharmacy Records  polyethylene glycol (MIRALAX  / GLYCOLAX ) 17 g packet [id number] Yes Take 17 g by mouth daily as needed (constipation.). [provider] Taking Active Child, Pharmacy Records  potassium gluconate 595 (99 K) MG TABS tablet [id number] Yes Take 595 mg by mouth in the morning.  [provider] Taking Active Child, Pharmacy Records  ranolazine (RANEXA) 500 MG 12 hr tablet [id number] Yes Take 500 mg by mouth 2 (two) times daily. [provider] Taking Active Child, Pharmacy Records  senna (SENOKOT) 8.6 MG TABS tablet [id number] Yes Take 1 tablet by mouth daily as needed for mild constipation. [provider] Taking Active Child, Pharmacy Records  tamsulosin  (FLOMAX ) 0.4 MG CAPS capsule [id number] Yes TAKE 1 CAPSULE BY MOUTH TWICE A DAY Trent Frizzle, MD Taking Active Child, Pharmacy Records            Home Care and Equipment/Supplies: Were Home Health Services Ordered?: No Any new equipment or medical supplies ordered?: No  Functional Questionnaire: Do you need assistance with bathing/showering or dressing?: Yes (shower chair) Do you need assistance with meal preparation?: Yes (daughter assists) Do you need assistance with eating?: No Do you have difficulty maintaining continence: Yes (has foley catheter, patent and draining, no issues at present) Do you need assistance with getting out of bed/getting out of a chair/moving?: No  Follow up appointments reviewed: PCP Follow-up appointment confirmed?:  (daughter states she will make appointment to accomodate her schedule, she works and only certain days/ times that will work for her) MD Provider Line [phone] Given: No Specialist Hospital Follow-up appointment confirmed?: Yes Date of Specialist follow-up appointment?: 07/18/23 Follow-Up Specialty Provider:: [facility] Do you need transportation to your follow-up appointment?: No Do you understand care options if your condition(s) worsen?: Yes-patient verbalized understanding  SDOH Interventions Today    Flowsheet Row Most Recent Value  SDOH Interventions   Food Insecurity Interventions Intervention Not Indicated  Housing Interventions Intervention Not Indicated  Transportation Interventions Intervention Not  Indicated  Utilities Interventions Intervention Not Indicated       Cecilie Coffee [facility], BSN RN Care Manager/ Transition of Care [place]/ [facility] [phone]

## 2023-07-18 DIAGNOSIS — R338 Other retention of urine: Secondary | ICD-10-CM | POA: Diagnosis not present

## 2023-07-18 NOTE — Addendum Note (Signed)
 Encounter addended by: Adam Holm on: 07/18/2023 2:47 PM  Actions taken: Imaging Exam ended

## 2023-07-19 DIAGNOSIS — R338 Other retention of urine: Secondary | ICD-10-CM | POA: Diagnosis not present

## 2023-07-21 ENCOUNTER — Other Ambulatory Visit: Payer: Self-pay | Admitting: Family Medicine

## 2023-07-23 ENCOUNTER — Other Ambulatory Visit: Payer: Self-pay | Admitting: Urology

## 2023-07-23 ENCOUNTER — Other Ambulatory Visit: Payer: Self-pay | Admitting: Family Medicine

## 2023-08-05 DIAGNOSIS — R338 Other retention of urine: Secondary | ICD-10-CM | POA: Diagnosis not present

## 2023-08-29 DIAGNOSIS — R338 Other retention of urine: Secondary | ICD-10-CM | POA: Diagnosis not present

## 2023-08-30 ENCOUNTER — Other Ambulatory Visit (HOSPITAL_COMMUNITY): Payer: Self-pay | Admitting: Adult Health

## 2023-08-30 DIAGNOSIS — N312 Flaccid neuropathic bladder, not elsewhere classified: Secondary | ICD-10-CM

## 2023-08-30 DIAGNOSIS — R339 Retention of urine, unspecified: Secondary | ICD-10-CM

## 2023-09-19 ENCOUNTER — Other Ambulatory Visit (HOSPITAL_COMMUNITY): Payer: Self-pay | Admitting: Student

## 2023-09-19 DIAGNOSIS — N138 Other obstructive and reflux uropathy: Secondary | ICD-10-CM

## 2023-09-20 ENCOUNTER — Other Ambulatory Visit: Payer: Self-pay | Admitting: Student
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
## 2023-09-23 ENCOUNTER — Other Ambulatory Visit: Payer: Self-pay

## 2023-09-23 ENCOUNTER — Other Ambulatory Visit (HOSPITAL_COMMUNITY): Payer: Self-pay | Admitting: Radiology

## 2023-09-23 ENCOUNTER — Ambulatory Visit (HOSPITAL_COMMUNITY)
Admission: RE | Admit: 2023-09-23 | Discharge: 2023-09-23 | Disposition: A | Discharge status: Home / Self Care | Source: Ambulatory Visit | Attending: Adult Health | Admitting: Adult Health

## 2023-09-23 ENCOUNTER — Emergency Department (HOSPITAL_BASED_OUTPATIENT_CLINIC_OR_DEPARTMENT_OTHER): Admitting: Radiology

## 2023-09-23 ENCOUNTER — Observation Stay (HOSPITAL_BASED_OUTPATIENT_CLINIC_OR_DEPARTMENT_OTHER)
Admission: EM | Admit: 2023-09-23 | Discharge: 2023-09-26 | Disposition: A | Discharge status: Home / Self Care | Attending: Internal Medicine | Admitting: Internal Medicine

## 2023-09-23 DIAGNOSIS — N400 Enlarged prostate without lower urinary tract symptoms: Secondary | ICD-10-CM | POA: Diagnosis not present

## 2023-09-23 DIAGNOSIS — Z87891 Personal history of nicotine dependence: Secondary | ICD-10-CM | POA: Insufficient documentation

## 2023-09-23 DIAGNOSIS — I428 Other cardiomyopathies: Secondary | ICD-10-CM | POA: Insufficient documentation

## 2023-09-23 DIAGNOSIS — K219 Gastro-esophageal reflux disease without esophagitis: Secondary | ICD-10-CM | POA: Insufficient documentation

## 2023-09-23 DIAGNOSIS — Z1152 Encounter for screening for COVID-19: Secondary | ICD-10-CM | POA: Diagnosis not present

## 2023-09-23 DIAGNOSIS — Z7982 Long term (current) use of aspirin: Secondary | ICD-10-CM | POA: Diagnosis not present

## 2023-09-23 DIAGNOSIS — A419 Sepsis, unspecified organism: Secondary | ICD-10-CM | POA: Diagnosis not present

## 2023-09-23 DIAGNOSIS — N312 Flaccid neuropathic bladder, not elsewhere classified: Secondary | ICD-10-CM | POA: Insufficient documentation

## 2023-09-23 DIAGNOSIS — I10 Essential (primary) hypertension: Secondary | ICD-10-CM | POA: Insufficient documentation

## 2023-09-23 DIAGNOSIS — Z6831 Body mass index (BMI) 31.0-31.9, adult: Secondary | ICD-10-CM | POA: Insufficient documentation

## 2023-09-23 DIAGNOSIS — E876 Hypokalemia: Secondary | ICD-10-CM | POA: Diagnosis not present

## 2023-09-23 DIAGNOSIS — N390 Urinary tract infection, site not specified: Secondary | ICD-10-CM | POA: Diagnosis not present

## 2023-09-23 DIAGNOSIS — R339 Retention of urine, unspecified: Secondary | ICD-10-CM | POA: Insufficient documentation

## 2023-09-23 DIAGNOSIS — R000 Tachycardia, unspecified: Secondary | ICD-10-CM | POA: Diagnosis not present

## 2023-09-23 DIAGNOSIS — N1832 Chronic kidney disease, stage 3b: Secondary | ICD-10-CM | POA: Insufficient documentation

## 2023-09-23 DIAGNOSIS — E039 Hypothyroidism, unspecified: Secondary | ICD-10-CM | POA: Diagnosis not present

## 2023-09-23 DIAGNOSIS — E119 Type 2 diabetes mellitus without complications: Secondary | ICD-10-CM | POA: Insufficient documentation

## 2023-09-23 DIAGNOSIS — E66811 Obesity, class 1: Secondary | ICD-10-CM | POA: Diagnosis not present

## 2023-09-23 DIAGNOSIS — E785 Hyperlipidemia, unspecified: Secondary | ICD-10-CM | POA: Insufficient documentation

## 2023-09-23 DIAGNOSIS — N138 Other obstructive and reflux uropathy: Secondary | ICD-10-CM

## 2023-09-23 DIAGNOSIS — D649 Anemia, unspecified: Secondary | ICD-10-CM | POA: Insufficient documentation

## 2023-09-23 DIAGNOSIS — T83511A Infection and inflammatory reaction due to indwelling urethral catheter, initial encounter: Secondary | ICD-10-CM | POA: Insufficient documentation

## 2023-09-23 DIAGNOSIS — R079 Chest pain, unspecified: Secondary | ICD-10-CM | POA: Diagnosis not present

## 2023-09-23 DIAGNOSIS — F0150 Vascular dementia without behavioral disturbance: Secondary | ICD-10-CM | POA: Insufficient documentation

## 2023-09-23 DIAGNOSIS — N401 Enlarged prostate with lower urinary tract symptoms: Secondary | ICD-10-CM | POA: Insufficient documentation

## 2023-09-23 DIAGNOSIS — Z8679 Personal history of other diseases of the circulatory system: Secondary | ICD-10-CM | POA: Diagnosis not present

## 2023-09-23 DIAGNOSIS — N320 Bladder-neck obstruction: Secondary | ICD-10-CM | POA: Diagnosis not present

## 2023-09-23 DIAGNOSIS — R109 Unspecified abdominal pain: Secondary | ICD-10-CM | POA: Diagnosis present

## 2023-09-23 DIAGNOSIS — J439 Emphysema, unspecified: Secondary | ICD-10-CM | POA: Insufficient documentation

## 2023-09-23 DIAGNOSIS — I129 Hypertensive chronic kidney disease with stage 1 through stage 4 chronic kidney disease, or unspecified chronic kidney disease: Secondary | ICD-10-CM | POA: Insufficient documentation

## 2023-09-23 HISTORY — PX: IR CYSTOSTOMY TUBE PLACEMENT/BLADDER ASPIRATION: IMG1097

## 2023-09-23 LAB — PROTIME-INR
INR: 1 (ref 0.8–1.2)
INR: 1 (ref 0.8–1.2)
Prothrombin Time: 13.2 s (ref 11.4–15.2)
Prothrombin Time: 13.9 s (ref 11.4–15.2)

## 2023-09-23 LAB — CBC
HCT: 38.6 % — ABNORMAL LOW (ref 39.0–52.0)
HCT: 42 % (ref 39.0–52.0)
Hemoglobin: 12.9 g/dL — ABNORMAL LOW (ref 13.0–17.0)
Hemoglobin: 14 g/dL (ref 13.0–17.0)
MCH: 29.4 pg (ref 26.0–34.0)
MCH: 29.3 pg (ref 26.0–34.0)
MCHC: 33.4 g/dL (ref 30.0–36.0)
MCHC: 33.3 g/dL (ref 30.0–36.0)
MCV: 87.9 fL (ref 80.0–100.0)
MCV: 87.9 fL (ref 80.0–100.0)
Platelets: 385 K/uL (ref 150–400)
Platelets: 416 K/uL — ABNORMAL HIGH (ref 150–400)
RBC: 4.39 MIL/uL (ref 4.22–5.81)
RBC: 4.78 MIL/uL (ref 4.22–5.81)
RDW: 15 % (ref 11.5–15.5)
RDW: 15.2 % (ref 11.5–15.5)
WBC: 8.8 K/uL (ref 4.0–10.5)
WBC: 15.7 K/uL — ABNORMAL HIGH (ref 4.0–10.5)
nRBC: 0 % (ref 0.0–0.2)
nRBC: 0 % (ref 0.0–0.2)

## 2023-09-23 LAB — HEPATIC FUNCTION PANEL
ALT: 10 U/L (ref 0–44)
AST: 26 U/L (ref 15–41)
Albumin: 3.5 g/dL (ref 3.5–5.0)
Alkaline Phosphatase: 72 U/L (ref 38–126)
Bilirubin, Direct: 0.3 mg/dL — ABNORMAL HIGH (ref 0.0–0.2)
Indirect Bilirubin: 0.4 mg/dL (ref 0.3–0.9)
Total Bilirubin: 0.7 mg/dL (ref 0.0–1.2)
Total Protein: 6.5 g/dL (ref 6.5–8.1)

## 2023-09-23 LAB — GLUCOSE, CAPILLARY
Glucose-Capillary: 103 mg/dL — ABNORMAL HIGH (ref 70–99)
Glucose-Capillary: 106 mg/dL — ABNORMAL HIGH (ref 70–99)

## 2023-09-23 LAB — BASIC METABOLIC PANEL WITH GFR
Anion gap: 10 (ref 5–15)
Anion gap: 13 (ref 5–15)
BUN: 14 mg/dL (ref 8–23)
BUN: 16 mg/dL (ref 8–23)
CO2: 24 mmol/L (ref 22–32)
CO2: 22 mmol/L (ref 22–32)
Calcium: 8.7 mg/dL — ABNORMAL LOW (ref 8.9–10.3)
Calcium: 9.4 mg/dL (ref 8.9–10.3)
Chloride: 103 mmol/L (ref 98–111)
Chloride: 101 mmol/L (ref 98–111)
Creatinine, Ser: 1.51 mg/dL — ABNORMAL HIGH (ref 0.61–1.24)
Creatinine, Ser: 1.69 mg/dL — ABNORMAL HIGH (ref 0.61–1.24)
GFR, Estimated: 48 mL/min — ABNORMAL LOW (ref >60)
GFR, Estimated: 42 mL/min — ABNORMAL LOW (ref >60)
Glucose, Bld: 96 mg/dL (ref 70–99)
Glucose, Bld: 149 mg/dL — ABNORMAL HIGH (ref 70–99)
Potassium: 3.8 mmol/L (ref 3.5–5.1)
Potassium: 4.2 mmol/L (ref 3.5–5.1)
Sodium: 137 mmol/L (ref 135–145)
Sodium: 136 mmol/L (ref 135–145)

## 2023-09-23 LAB — URINALYSIS, ROUTINE W REFLEX MICROSCOPIC
Bilirubin Urine: NEGATIVE
Glucose, UA: NEGATIVE mg/dL
Ketones, ur: NEGATIVE mg/dL
Nitrite: NEGATIVE
Protein, ur: 30 mg/dL — AB
RBC / HPF: >50 RBC/hpf (ref 0–5)
Specific Gravity, Urine: 1.019 (ref 1.005–1.030)
WBC, UA: >50 WBC/hpf (ref 0–5)
pH: 5.5 (ref 5.0–8.0)

## 2023-09-23 LAB — RESP PANEL BY RT-PCR (RSV, FLU A&B, COVID)  RVPGX2
Influenza A by PCR: NEGATIVE
Influenza B by PCR: NEGATIVE
Resp Syncytial Virus by PCR: NEGATIVE
SARS Coronavirus 2 by RT PCR: NEGATIVE

## 2023-09-23 LAB — LACTIC ACID, PLASMA: Lactic Acid, Venous: 2.5 mmol/L (ref 0.5–1.9)

## 2023-09-23 LAB — TROPONIN T, HIGH SENSITIVITY: Troponin T High Sensitivity: <15 ng/L (ref <19)

## 2023-09-23 MED ORDER — VANCOMYCIN HCL IN DEXTROSE 1-5 GM/200ML-% IV SOLN
1000.0000 mg | Freq: Once | INTRAVENOUS | Status: AC
  Administered 2023-09-23: 1000 mg via INTRAVENOUS
  Filled 2023-09-23: qty 200

## 2023-09-23 MED ORDER — SODIUM CHLORIDE 0.9 % IV SOLN
2.0000 g | Freq: Once | INTRAVENOUS | Status: AC
  Administered 2023-09-23: 2 g via INTRAVENOUS
  Filled 2023-09-23: qty 12.5

## 2023-09-23 MED ORDER — LACTATED RINGERS IV BOLUS
1000.0000 mL | Freq: Once | INTRAVENOUS | Status: AC
  Administered 2023-09-23: 1000 mL via INTRAVENOUS

## 2023-09-23 MED ORDER — SODIUM CHLORIDE 0.9 % IV SOLN: INTRAVENOUS | Status: DC

## 2023-09-23 MED ORDER — ACETAMINOPHEN 500 MG PO TABS
1000.0000 mg | ORAL_TABLET | Freq: Once | ORAL | Status: AC
  Administered 2023-09-23: 1000 mg via ORAL
  Filled 2023-09-23: qty 2

## 2023-09-23 MED ORDER — FENTANYL CITRATE (PF) 100 MCG/2ML IJ SOLN
INTRAMUSCULAR | Status: AC
  Filled 2023-09-23: qty 2

## 2023-09-23 MED ORDER — LIDOCAINE-EPINEPHRINE 1 %-1:100000 IJ SOLN
20.0000 mL | Freq: Once | INTRAMUSCULAR | Status: AC
  Administered 2023-09-23: 15 mL via INTRADERMAL

## 2023-09-23 MED ORDER — IOHEXOL 300 MG/ML  SOLN
50.0000 mL | Freq: Once | INTRAMUSCULAR | Status: AC | PRN
  Administered 2023-09-23: 25 mL

## 2023-09-23 MED ORDER — LIDOCAINE-EPINEPHRINE 1 %-1:100000 IJ SOLN
INTRAMUSCULAR | Status: AC
  Filled 2023-09-23: qty 1

## 2023-09-23 MED ORDER — LIDOCAINE VISCOUS HCL 2 % MT SOLN
OROMUCOSAL | Status: AC
  Filled 2023-09-23: qty 15

## 2023-09-23 MED ORDER — DIPHENHYDRAMINE HCL 50 MG/ML IJ SOLN
INTRAMUSCULAR | Status: AC
  Filled 2023-09-23: qty 1

## 2023-09-23 MED ORDER — LACTATED RINGERS IV SOLN: INTRAVENOUS | Status: DC

## 2023-09-23 MED ORDER — METRONIDAZOLE 500 MG/100ML IV SOLN
500.0000 mg | Freq: Once | INTRAVENOUS | Status: AC
  Administered 2023-09-23: 500 mg via INTRAVENOUS
  Filled 2023-09-23: qty 100

## 2023-09-23 MED ORDER — FENTANYL CITRATE (PF) 100 MCG/2ML IJ SOLN
INTRAMUSCULAR | Status: AC | PRN
  Administered 2023-09-23 (×2): 25 ug via INTRAVENOUS
  Administered 2023-09-23: 50 ug via INTRAVENOUS

## 2023-09-23 NOTE — Sepsis Progress Note (Signed)
 Elink monitoring for the code sepsis protocol.

## 2023-09-23 NOTE — Plan of Care (Addendum)
 [facility] transfer progressing unit:  73 year old man past medical history of coronary disease, DVT, nonischemic cardiomyopathy,  essential hypertension, hyperlipidemia, GERD, COPD, emphysema, dementia, chronic anemia, BPH, areflexic bladder with chronic urinary retention status post PA 09/2023 failed voiding trial, [facility] by [facility] has been referred to radiology outpatient for suprapubic catheter placement.  Patient underwent suprapubic catheter placement today at outpatient radiology after discharge home and recommended to follow-up in 6 to 8 weeks to exchange over a wire.  Patient's daughter reported that he has episode of a UTI 1 week ago has been taking Keflex  however today after the suprapubic catheter placement patient developed some fever later in the evening, chills and not seems to be himself.  At presentation to ED patient found febrile, tachycardic tachypneic.  Blood pressure within good range. UA showed evidence of UTI.   CBC showed leukocytosis 15.7 otherwise unremarkable.  Normal troponin level.  Lactic acid elevated 2.5.  Pending second lactic acid level.  Blood culture is in process.  BMP unremarkable creatinine 1.6 and GFR 42.  Renal function at baseline.  Normal pro time INR.  Normal hepatic function panel.  Chest x-ray no active disease process.  In the ED patient received cefepime  and vancomycin .  Currently on maintenance fluid LR at 150 cc/h. I have ordered unit of LR transfusion.  Need to follow-up with repeat lactic acid level.  Hospitalist has been consulted for further evaluation management of sepsis in the setting of acute cystitis and acute metabolic encephalopathy in the setting of UTI.  [facility] will assume care on arrival to accepting facility. Until arrival, care as per EDP. However, [facility] available 24/7 for questions and assistance. Check [email or web address] for on-call coverage. Nursing staff, please call [facility] Admits & Consults System-Wide  number under Amion on patient's arrival so appropriate admitting provider can evaluate the pt.   Author: Jaben Benegas, MD  [facility]

## 2023-09-23 NOTE — ED Triage Notes (Signed)
 Super pubic cath placed today Elevated temp, HR sob, feeling weak and body aches

## 2023-09-23 NOTE — ED Provider Notes (Signed)
 [place] EMERGENCY DEPARTMENT AT [facility] Provider Note   CSN: [id number] Arrival date & time: 09/23/23  8095     Patient presents with: Post-op Problem   Bruce [name] is a 73 y.o. male.   Pt is a 73 yo male with pmhx significant for crohn's disease, sleep apnea, hypothyroidism, DVT, HTN, nonischemic CM, GERD, DM2, DDD, BPH with urinary retention and dementia.  Pt has had an indwelling foley cath for several months due to urinary retention and neurogenic bladder.  Decision made by urology to have IR place a suprapubic catheter.  This was done today.  Pt just finished keflex  last week for a UTI.  No recent cultures in epic.  Pt's daughter said he developed chills and fever this afternoon around 1500.  She gave him 1 g tylenol .  He developed another fever pta and he was given another gram of tylenol .  Pt feels weak and had body aches.  He is tired.  He has lower abd pain.         Prior to Admission medications   Medication Sig Start Date End Date Taking? Authorizing Provider  acetaminophen  (TYLENOL ) 500 MG tablet Take 1 tablet (500 mg total) by mouth every 6 (six) hours as needed. 08/04/22   Nivia Colon, PA-C  aspirin  81 MG chewable tablet Chew 81 mg by mouth in the morning.    [provider]  carvedilol  (COREG ) 12.5 MG tablet TAKE 1 TABLET (12.5MG  TOTAL) BY MOUTH TWICE A DAY WITH MEALS Patient taking differently: Take 12.5 mg by mouth 2 (two) times daily with a meal. 03/12/23   Katrinka Garnette KIDD, MD  Cyanocobalamin  (VITAMIN B12 PO) Take 1 tablet by mouth in the morning.    [provider]  GARLIC PO Take 1 capsule by mouth 3 (three) times daily before meals.    [provider]  GEMTESA 75 MG TABS Take 75 mg by mouth at bedtime. 06/09/23   [provider]  levothyroxine  (SYNTHROID ) 200 MCG tablet TAKE 1 TABLET (200 MCG TOTAL) BY MOUTH DAILY BEFORE BREAKFAST. 07/23/23   Katrinka Garnette KIDD, MD  losartan  (COZAAR ) 25 MG tablet TAKE 1 TABLET  (25 MG TOTAL) BY MOUTH IN THE MORNING 07/22/23   Katrinka Garnette KIDD, MD  pantoprazole  (PROTONIX ) 40 MG tablet Take 1 tablet (40 mg total) by mouth daily. Patient taking differently: Take 40 mg by mouth daily as needed (indigestion/heartburn.). 03/15/22   Pyrtle, Gordy HERO, MD  Polyethyl Glycol-Propyl Glycol (SYSTANE) 0.4-0.3 % SOLN Place 1-2 drops into both eyes at bedtime.    [provider]  polyethylene glycol (MIRALAX  / GLYCOLAX ) 17 g packet Take 17 g by mouth daily as needed (constipation.).    [provider]  potassium gluconate 595 (99 K) MG TABS tablet Take 595 mg by mouth in the morning.    [provider]  ranolazine  (RANEXA ) 500 MG 12 hr tablet Take 500 mg by mouth 2 (two) times daily.    [provider]  senna (SENOKOT) 8.6 MG TABS tablet Take 1 tablet by mouth daily as needed for mild constipation.    [provider]  tamsulosin  (FLOMAX ) 0.4 MG CAPS capsule TAKE 1 CAPSULE BY MOUTH TWICE A DAY 07/24/23   Matilda Garnette, MD    Allergies: Lisinopril , Coconut flavoring agent (non-screening), Food, and Benzodiazepines    Review of Systems  Constitutional:  Positive for chills, fatigue and fever.  Gastrointestinal:  Positive for abdominal pain.  All other systems reviewed and are  negative.   Updated Vital Signs BP 117/68 (BP Location: Right Arm)   Pulse (!) 111   Temp (!) 101 F (38.3 C) (Oral)   Resp (!) 22   SpO2 95%   Physical Exam Vitals and nursing note reviewed.  Constitutional:      Appearance: Normal appearance. He is obese. He is ill-appearing.  HENT:     Head: Normocephalic and atraumatic.     Right Ear: External ear normal.     Left Ear: External ear normal.     Nose: Nose normal.     Mouth/Throat:     Mouth: Mucous membranes are dry.  Eyes:     Extraocular Movements: Extraocular movements intact.     Conjunctiva/sclera: Conjunctivae normal.     Pupils: Pupils are equal, round, and reactive to light.   Cardiovascular:     Rate and Rhythm: Regular rhythm. Tachycardia present.  Abdominal:     General: Abdomen is flat. Bowel sounds are normal.     Palpations: Abdomen is soft.     Comments: Suprapubic catheter in place  Musculoskeletal:        General: Normal range of motion.     Cervical back: Normal range of motion and neck supple.  Skin:    General: Skin is warm.     Capillary Refill: Capillary refill takes less than 2 seconds.  Neurological:     General: No focal deficit present.     Mental Status: He is alert and oriented to person, place, and time.  Psychiatric:        Mood and Affect: Mood normal.        Behavior: Behavior normal.        Thought Content: Thought content normal.        Judgment: Judgment normal.     (all labs ordered are listed, but only abnormal results are displayed) Labs Reviewed  BASIC METABOLIC PANEL WITH GFR - Abnormal; Notable for the following components:      Result Value   Glucose, Bld 149 (*)    Creatinine, Ser 1.69 (*)    GFR, Estimated 42 (*)    All other components within normal limits  CBC - Abnormal; Notable for the following components:   WBC 15.7 (*)    Platelets 416 (*)    All other components within normal limits  LACTIC ACID, PLASMA - Abnormal; Notable for the following components:   Lactic Acid, Venous 2.5 (*)    All other components within normal limits  URINALYSIS, ROUTINE W REFLEX MICROSCOPIC - Abnormal; Notable for the following components:   APPearance HAZY (*)    Hgb urine dipstick LARGE (*)    Protein, ur 30 (*)    Leukocytes,Ua LARGE (*)    Bacteria, UA RARE (*)    All other components within normal limits  HEPATIC FUNCTION PANEL - Abnormal; Notable for the following components:   Bilirubin, Direct 0.3 (*)    All other components within normal limits  RESP PANEL BY RT-PCR (RSV, FLU A&B, COVID)  RVPGX2  CULTURE, BLOOD (ROUTINE X 2)  CULTURE, BLOOD (ROUTINE X 2)  PROTIME-INR  LACTIC ACID, PLASMA  TROPONIN T, HIGH  SENSITIVITY  TROPONIN T, HIGH SENSITIVITY    EKG: None  Radiology: [facility] Chest Port 1 View Result Date: 09/23/2023 CLINICAL DATA:  Chest pain EXAM: PORTABLE CHEST 1 VIEW COMPARISON:  None Available. FINDINGS: Normal mediastinum and cardiac silhouette. Normal pulmonary vasculature. No evidence of effusion, infiltrate, or pneumothorax. No acute bony abnormality. IMPRESSION:  No acute cardiopulmonary process. Electronically Signed   By: Jackquline Boxer M.D.   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 Prostate artery embolization performed in April of 2025 was unsuccessful at relieving urinary retention. Therefore, patient presents for placement of a suprapubic catheter. EXAM: Placement of suprapubic catheter utilizing ultrasound and fluoroscopic guidance COMPARISON:  None Available. MEDICATIONS: None. ANESTHESIA/SEDATION: 100 mcg fentanyl  administered for pain control. This does not constitute moderate sedation. CONTRAST:  25 mL Omnipaque  300-administered into the collecting system(s) FLUOROSCOPY: Radiation Exposure Index (as provided by the fluoroscopic device): 135 mGy Kerma COMPLICATIONS: None immediate. PROCEDURE: Informed written consent was obtained from the patient after a thorough discussion of the procedural risks, benefits and alternatives. All questions were addressed. Maximal Sterile Barrier Technique was utilized including caps, mask, sterile gowns, sterile gloves, sterile drape, hand hygiene and skin antiseptic. A timeout was performed prior to the initiation of the procedure. Ultrasound was used to interrogate the bladder. Bladder is under distended due to the presence of the Foley catheter despite clamping of the catheter. Therefore, sterile saline was instilled through the Foley catheter to distend bladder. A suitable skin entry site was selected and  marked. Local anesthesia was attained by infiltration with 1% lidocaine . A small dermatotomy was made. Under real-time ultrasound guidance, an 18 gauge trocar needle was advanced through the anterior abdominal wall and into the bladder lumen. The 0.035 wire was then advanced into the bladder. A 7 x 100 Athletis balloon was placed coaxially through a 16 French Councill tip Foley catheter. The angioplasty balloon and Foley catheter assembly was then advanced over the wire and the balloon positioned across the percutaneous tract. The balloon was inflated to full effacement. As the balloon was deflated, the balloon and Foley catheter assembly was advanced as a unit over the wire and into the bladder lumen. The balloon was deflated and an attempt was made at removal, however the balloon would not deflate properly and became stuck in the tip of the Foley catheter. Therefore, a road runner hydrophilic wire was advanced adjacent to the angioplasty catheter and through the side-hole of the Foley catheter into the bladder to maintain the tract. Next, the bladder and the stuck balloon were removed as a unit. The balloon was removed and disposed of. A Kumpe the catheter was advanced over the hydrophilic catheter into the bladder. Contrast was injected confirming that the catheter was in fact within the bladder lumen. The hydrophilic wire was then exchanged for a superstiff Amplatz wire. A new 7 x 100 Athletis balloon was advanced to the Council tip Foley catheter and the procedure was repeated. This time, the balloon deflated properly and was easily removed leaving the Foley catheter within the bladder. The retention balloon was filled with 10 mL saline. Contrast was injected through the Foley catheter confirming its position within the urinary bladder. IMPRESSION: Successful placement of a 16 French Councill tip Foley catheter via suprapubic approach. Patient to return Interventional Radiology in 6-8 weeks for first catheter  exchange over a wire. Electronically Signed   By: Wilkie Lent M.D.   On: 09/23/2023 13:06     Procedures   Medications Ordered in the ED  lactated ringers  infusion ( Intravenous New Bag/Given 09/23/23 2023)  lactated ringers  bolus 1,000 mL (has no administration in time range)  lactated ringers  bolus 1,000 mL (has no administration in time range)  ceFEPIme  (MAXIPIME ) 2 g in  sodium chloride  0.9 % 100 mL IVPB (0 g Intravenous Stopped 09/23/23 2130)  metroNIDAZOLE  (FLAGYL ) IVPB 500 mg (0 mg Intravenous Stopped 09/23/23 2143)  vancomycin  (VANCOCIN ) IVPB 1000 mg/200 mL premix (1,000 mg Intravenous New Bag/Given 09/23/23 2132)  acetaminophen  (TYLENOL ) tablet 1,000 mg (1,000 mg Oral Given 09/23/23 2135)                                    Medical Decision Making Amount and/or Complexity of Data Reviewed Labs: ordered. Radiology: ordered.  Risk OTC drugs. Prescription drug management. Decision regarding hospitalization.   This patient presents to the ED for concern of fever, this involves an extensive number of treatment options, and is a complaint that carries with it a high risk of complications and morbidity.  The differential diagnosis includes sepsis, uti, perf   Co morbidities that complicate the patient evaluation  crohn's disease, sleep apnea, hypothyroidism, DVT, HTN, nonischemic CM, GERD, DM2, DDD, BPH with urinary retention and dementia   Additional history obtained:  Additional history obtained from epic chart review External records from outside source obtained and reviewed including family   Lab Tests:  I Ordered, and personally interpreted labs.  The pertinent results include:  cbc with wbc elevated at 15.7, bmp nl other than cr elevated at 1.69 (cr 1.51 on 7/21); lfts nl; lactic elevated at 2.5; trop neg; ua + uti   Imaging Studies ordered:  I ordered imaging studies including cxr  I independently visualized and interpreted imaging which showed No acute  cardiopulmonary process.  I agree with the radiologist interpretation   Cardiac Monitoring:  The patient was maintained on a cardiac monitor.  I personally viewed and interpreted the cardiac monitored which showed an underlying rhythm of: st   Medicines ordered and prescription drug management:  I ordered medication including ivfs/abx  for sepsis  Reevaluation of the patient after these medicines showed that the patient improved I have reviewed the patients home medicines and have made adjustments as needed   Test Considered:  ct   Critical Interventions:  Ivfs/abx   Consultations Obtained:  I requested consultation with the hospitalist (Dr. Lee),  and discussed lab and imaging findings as well as pertinent plan -she will admit   Problem List / ED Course:  Sepsis from UTI:  ivfs and iv abx given.  Pt is looking better and hr is improving.     Reevaluation:  After the interventions noted above, I reevaluated the patient and found that they have :improved   Social Determinants of Health:  Lives at home   Dispostion:  After consideration of the diagnostic results and the patients response to treatment, I feel that the patent would benefit from admission.    CRITICAL CARE Performed by: Mliss Boyers   Total critical care time: 30 minutes  Critical care time was exclusive of separately billable procedures and treating other patients.  Critical care was necessary to treat or prevent imminent or life-threatening deterioration.  Critical care was time spent personally by me on the following activities: development of treatment plan with patient and/or surrogate as well as nursing, discussions with consultants, evaluation of patient's response to treatment, examination of patient, obtaining history from patient or surrogate, ordering and performing treatments and interventions, ordering and review of laboratory studies, ordering and review of radiographic  studies, pulse oximetry and re-evaluation of patient's condition.      Final diagnoses:  Sepsis without  acute organ dysfunction, due to unspecified organism [facility])  Urinary tract infection associated with indwelling urethral catheter, initial encounter [facility])    ED Discharge Orders     None          Dean Clarity, MD 09/23/23 2239

## 2023-09-23 NOTE — Procedures (Signed)
 Interventional Radiology Procedure Note  Procedure: Successful placement of a 58F Counsil-tip Foley catheter via suprapubic approach.   Complications: None  Estimated Blood Loss: None  Recommendations: - DC home - Return to IR in 6-8 weeks for first exchange over a wire  Signed,  Wilkie LOIS Lent, MD

## 2023-09-24 ENCOUNTER — Encounter (HOSPITAL_COMMUNITY): Payer: Self-pay | Admitting: Internal Medicine

## 2023-09-24 DIAGNOSIS — A419 Sepsis, unspecified organism: Secondary | ICD-10-CM

## 2023-09-24 DIAGNOSIS — N390 Urinary tract infection, site not specified: Secondary | ICD-10-CM | POA: Diagnosis present

## 2023-09-24 DIAGNOSIS — Z743 Need for continuous supervision: Secondary | ICD-10-CM | POA: Diagnosis not present

## 2023-09-24 DIAGNOSIS — T83511A Infection and inflammatory reaction due to indwelling urethral catheter, initial encounter: Secondary | ICD-10-CM | POA: Diagnosis present

## 2023-09-24 DIAGNOSIS — R531 Weakness: Secondary | ICD-10-CM | POA: Diagnosis not present

## 2023-09-24 LAB — LACTIC ACID, PLASMA: Lactic Acid, Venous: 1.4 mmol/L (ref 0.5–1.9)

## 2023-09-24 LAB — TROPONIN T, HIGH SENSITIVITY: Troponin T High Sensitivity: <15 ng/L (ref <19)

## 2023-09-24 MED ORDER — ONDANSETRON HCL 4 MG PO TABS: 4.0000 mg | ORAL_TABLET | Freq: Four times a day (QID) | ORAL | Status: DC | PRN

## 2023-09-24 MED ORDER — CARVEDILOL 12.5 MG PO TABS
12.5000 mg | ORAL_TABLET | Freq: Two times a day (BID) | ORAL | Status: DC
  Administered 2023-09-24 – 2023-09-26 (×4): 12.5 mg via ORAL
  Filled 2023-09-24 (×4): qty 1

## 2023-09-24 MED ORDER — ACETAMINOPHEN 650 MG RE SUPP: 650.0000 mg | Freq: Four times a day (QID) | RECTAL | Status: DC | PRN

## 2023-09-24 MED ORDER — HEPARIN SODIUM (PORCINE) 5000 UNIT/ML IJ SOLN
5000.0000 [IU] | Freq: Three times a day (TID) | INTRAMUSCULAR | Status: DC
  Administered 2023-09-24 – 2023-09-26 (×6): 5000 [IU] via SUBCUTANEOUS
  Filled 2023-09-24 (×6): qty 1

## 2023-09-24 MED ORDER — LEVOTHYROXINE SODIUM 100 MCG PO TABS
200.0000 ug | ORAL_TABLET | Freq: Every day | ORAL | Status: DC
  Administered 2023-09-25 – 2023-09-26 (×2): 200 ug via ORAL
  Filled 2023-09-24 (×2): qty 2

## 2023-09-24 MED ORDER — SODIUM CHLORIDE 0.9 % IV SOLN
1.0000 g | INTRAVENOUS | Status: DC
  Administered 2023-09-24 – 2023-09-25 (×2): 1 g via INTRAVENOUS
  Filled 2023-09-24 (×2): qty 10

## 2023-09-24 MED ORDER — ALBUTEROL SULFATE (2.5 MG/3ML) 0.083% IN NEBU: 2.5000 mg | INHALATION_SOLUTION | RESPIRATORY_TRACT | Status: DC | PRN

## 2023-09-24 MED ORDER — TRAZODONE HCL 50 MG PO TABS: 25.0000 mg | ORAL_TABLET | Freq: Every evening | ORAL | Status: DC | PRN

## 2023-09-24 MED ORDER — SODIUM CHLORIDE 0.9 % IV SOLN
2.0000 g | Freq: Two times a day (BID) | INTRAVENOUS | Status: DC
  Administered 2023-09-24: 2 g via INTRAVENOUS
  Filled 2023-09-24: qty 12.5

## 2023-09-24 MED ORDER — ACETAMINOPHEN 500 MG PO TABS
1000.0000 mg | ORAL_TABLET | Freq: Once | ORAL | Status: AC
  Administered 2023-09-24: 1000 mg via ORAL
  Filled 2023-09-24: qty 2

## 2023-09-24 MED ORDER — ONDANSETRON HCL 4 MG/2ML IJ SOLN: 4.0000 mg | Freq: Four times a day (QID) | INTRAMUSCULAR | Status: DC | PRN

## 2023-09-24 MED ORDER — POLYVINYL ALCOHOL 1.4 % OP SOLN
1.0000 [drp] | OPHTHALMIC | Status: DC | PRN
  Filled 2023-09-24: qty 15

## 2023-09-24 MED ORDER — DOCUSATE SODIUM 100 MG PO CAPS
100.0000 mg | ORAL_CAPSULE | Freq: Two times a day (BID) | ORAL | Status: DC
  Administered 2023-09-24 (×2): 100 mg via ORAL
  Filled 2023-09-24 (×4): qty 1

## 2023-09-24 MED ORDER — POLYETHYLENE GLYCOL 3350 17 G PO PACK: 17.0000 g | PACK | Freq: Every day | ORAL | Status: DC | PRN

## 2023-09-24 MED ORDER — ACETAMINOPHEN 325 MG PO TABS
650.0000 mg | ORAL_TABLET | Freq: Four times a day (QID) | ORAL | Status: DC | PRN
Start: 2023-09-24 — End: 2023-09-26
  Administered 2023-09-25: 650 mg via ORAL
  Filled 2023-09-24: qty 2

## 2023-09-24 NOTE — Plan of Care (Signed)

## 2023-09-24 NOTE — ED Notes (Signed)
 Called Thomas at [facility] for transport

## 2023-09-24 NOTE — ED Notes (Signed)
 Purple man green.

## 2023-09-24 NOTE — Progress Notes (Addendum)
 Care assumed after [facility] transport from [facility]. Patient alert, afebrile and resting in room, no c/o pain. Suprapubic catheter dressing, c,d, I. HOH. Calls appropriately. Regular diet. Questions addressed and family updated. SBA to bathroom.
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
## 2023-09-25 DIAGNOSIS — A419 Sepsis, unspecified organism: Secondary | ICD-10-CM | POA: Diagnosis present

## 2023-09-25 DIAGNOSIS — N390 Urinary tract infection, site not specified: Secondary | ICD-10-CM | POA: Diagnosis not present

## 2023-09-25 LAB — BASIC METABOLIC PANEL WITH GFR
Anion gap: 9 (ref 5–15)
BUN: 14 mg/dL (ref 8–23)
CO2: 23 mmol/L (ref 22–32)
Calcium: 8.3 mg/dL — ABNORMAL LOW (ref 8.9–10.3)
Chloride: 105 mmol/L (ref 98–111)
Creatinine, Ser: 1.38 mg/dL — ABNORMAL HIGH (ref 0.61–1.24)
GFR, Estimated: 54 mL/min — ABNORMAL LOW (ref >60)
Glucose, Bld: 78 mg/dL (ref 70–99)
Potassium: 3.3 mmol/L — ABNORMAL LOW (ref 3.5–5.1)
Sodium: 137 mmol/L (ref 135–145)

## 2023-09-25 LAB — CBC
HCT: 34.7 % — ABNORMAL LOW (ref 39.0–52.0)
Hemoglobin: 11.3 g/dL — ABNORMAL LOW (ref 13.0–17.0)
MCH: 29 pg (ref 26.0–34.0)
MCHC: 32.6 g/dL (ref 30.0–36.0)
MCV: 89.2 fL (ref 80.0–100.0)
Platelets: 346 K/uL (ref 150–400)
RBC: 3.89 MIL/uL — ABNORMAL LOW (ref 4.22–5.81)
RDW: 15.4 % (ref 11.5–15.5)
WBC: 13.7 K/uL — ABNORMAL HIGH (ref 4.0–10.5)
nRBC: 0 % (ref 0.0–0.2)

## 2023-09-25 LAB — URINE CULTURE: Culture: NO GROWTH

## 2023-09-25 MED ORDER — POTASSIUM CHLORIDE CRYS ER 20 MEQ PO TBCR
40.0000 meq | EXTENDED_RELEASE_TABLET | Freq: Once | ORAL | Status: AC
  Administered 2023-09-25: 40 meq via ORAL
  Filled 2023-09-25: qty 2

## 2023-09-25 MED ORDER — TRAMADOL HCL 50 MG PO TABS
50.0000 mg | ORAL_TABLET | Freq: Four times a day (QID) | ORAL | Status: DC | PRN
  Administered 2023-09-25: 50 mg via ORAL
  Filled 2023-09-25: qty 1

## 2023-09-25 NOTE — TOC Initial Note (Signed)
 Transition of Care [facility]) - Initial/Assessment Note    Patient Details  Name: Bruce [name] MRN: [id number] Date of Birth: 1950-04-12  [facility]) CM/SW Contact:    Bascom Service, RN Phone Number: 09/25/2023, 3:58 PM  Clinical Narrative: d/c plan home. Has good support. Has own transport.                  Expected Discharge Plan: Home/Self Care Barriers to Discharge: Continued Medical Work up   Patient Goals and CMS Choice            Expected Discharge Plan and Services                                              Prior Living Arrangements/Services                       Activities of Daily Living   ADL Screening (condition at time of admission) Independently performs ADLs?: Yes (appropriate for developmental age) Is the patient deaf or have difficulty hearing?: Yes Does the patient have difficulty seeing, even when wearing glasses/contacts?: No Does the patient have difficulty concentrating, remembering, or making decisions?: No  Permission Sought/Granted                  Emotional Assessment              Admission diagnosis:  Sepsis secondary to UTI (HCC) [A41.9, N39.0] Urinary tract infection associated with indwelling urethral catheter, initial encounter (HCC) [U16.488J, N39.0] Sepsis without acute organ dysfunction, due to unspecified organism (HCC) [A41.9] Sepsis (HCC) [A41.9] Patient Active Problem List   Diagnosis Date Noted   Sepsis (HCC) 09/25/2023   Sepsis secondary to UTI (HCC) 09/23/2023   Urinary retention 06/30/2023   Gross hematuria 06/30/2023   AKI (acute kidney injury) (HCC) 06/29/2023   Mild vascular dementia without behavioral disturbance, psychotic disturbance, mood disturbance, or anxiety (HCC) 05/24/2022   Cognitive deficits 02/15/2020   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   Headache    Lacunar infarction    Emphysema lung 10/27/2019   Myalgia due to statin 10/27/2019   History of DVT  (deep vein thrombosis) after J+J covid vaccine  06/26/2019   Nonischemic cardiomyopathy 04/01/2018   Erectile dysfunction 04/01/2018   GERD (gastroesophageal reflux disease) 04/01/2018   Hoarseness 06/27/2017   Aortic atherosclerosis 03/30/2017   Osteoarthritis of left knee 06/21/2016   Onychomycosis 06/21/2016   Former smoker 12/02/2015   DDD (degenerative disc disease), 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   Family history of colon cancer 01/26/2014   Iron deficiency anemia 09/03/2013   B12 deficiency anemia 09/03/2013   Type II diabetes mellitus with manifestations 08/17/2013   Hyperlipidemia 12/17/2012   Constipation 05/20/2012   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 [place] [address] Phone: [phone] Fax: [phone]  [address] [facility] - [place], [place] - [place] [address] [place] [address] Phone: [phone] Fax: [phone]  [facility] - [place], [place] - [address] [place] [address] Phone: [phone] Fax: [phone]  [id number] [place], [place] - [address] [place] [address] Phone: [phone] Fax: [phone]  DARRYLE LONG - [facility] [address] [place] [address] Phone: [phone] Fax: [phone]     Social Drivers of Health (SDOH) Social History: SDOH Screenings   Food Insecurity: No Food Insecurity (09/24/2023)  Housing: Low Risk  (09/24/2023)  Transportation Needs: No Transportation Needs (09/24/2023)  Utilities: Not At Risk (09/24/2023)  Depression (PHQ2-9): Low Risk   (09/27/2022)  Financial Resource Strain: Low Risk  (04/26/2023)   Received from [facility]  Physical Activity: Inactive (09/26/2022)  Social Connections: Moderately Integrated (09/24/2023)  Stress: No Stress Concern Present (09/26/2022)  Tobacco Use: Medium Risk (09/24/2023)  Health Literacy: Adequate Health Literacy (09/27/2022)   SDOH Interventions:     Readmission Risk Interventions     No data to display

## 2023-09-25 NOTE — Plan of Care (Signed)
  Problem: Clinical Measurements: Goal: Diagnostic test results will improve Outcome: Progressing   Problem: Elimination: Goal: Will not experience complications related to bowel motility Outcome: Progressing Goal: Will not experience complications related to urinary retention Outcome: Progressing   Problem: Pain Managment: Goal: General experience of comfort will improve and/or be controlled Outcome: Progressing

## 2023-09-25 NOTE — Hospital Course (Addendum)
 Bruce [name] is a 74 y.o. male with medical history significant for Crohn's disease status post bowel resection, history of DVT not on anticoagulation, hypertension, nonischemic cardiomyopathy, BPH with recent prostate artery embolization now status post suprapubic catheter placement with IR on 7/21 presented to hospital with pelvic pain and subjective fever chills and rigors after suprapubic catheter placement by IR 1 day prior to presentation.  Of note patient was recently treated with Keflex  for UTI as outpatient and had completed 5 days ago and had improved.  With this new symptom patient presented to [facility] ED when he was noted to have sepsis.  Initial labs showed leukocytosis with WBC at 15.7.  Creatinine elevated at 1.6.  Lactate was 2.5.  Patient was negative for COVID influenza and RSV.  Received IV fluids antibiotic and was considered for admission to the hospital for further evaluation and treatment.  Severe sepsis secondary to UTI status post suprapubic catheter placement 09/23/2023-presented with fever, tachycardia, leukocytosis, initial lactate greater than 2.  Subsequent lactate normalized at 1.4..  On empiric IV Rocephin .  Temperature max of 98.9 F.  Mild leukocytosis today at 13.7.  Blood cultures negative in less than 12 hours.  Follow urine cultures   BPH-now status post suprapubic urinary catheter.  Continue   CKD stage IIIb-initial creatinine of 1.6.  Creatinine today at 1.3.  Likely at baseline.   Hypothyroidism-continue Synthroid   Mild hypokalemia.  Potassium was 3.3 today.  Will replace orally.  Check levels in AM.   History of nonischemic cardiomyopathy- Compensated.  Continue Coreg 

## 2023-09-25 NOTE — Care Management Obs Status (Signed)
 MEDICARE OBSERVATION STATUS NOTIFICATION   Patient Details  Name: Bruce [name] MRN: [id number] Date of Birth: [birth year]   Medicare Observation Status Notification Given:  Yes    MahabirNathanel, RN 09/25/2023, 11:41 AM

## 2023-09-25 NOTE — Progress Notes (Signed)
 PROGRESS NOTE  Bruce [name] [name] FMW:[id number] DOB: 07/12/1950 DOA: 09/23/2023 PCP: Katrinka Garnette KIDD, MD   LOS: 1 day   Brief narrative:   Bruce [name] is a 73 y.o. 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  Of note, patient was recently treated with Keflex  for UTI as outpatient and had completed 5 days ago and had improved.  With this new symptom, patient presented to [facility] ED when he was noted to have sepsis.  Initial labs showed leukocytosis with WBC at 15.7.  Creatinine elevated at 1.6.  Lactate was 2.5.  Patient was negative for COVID influenza and RSV.  Received IV fluids antibiotic and was considered for admission to the hospital for further evaluation and treatment.   Assessment/Plan: Principal Problem:   Sepsis secondary to UTI [facility]) Active Problems:   Sepsis (HCC)  Severe sepsis secondary to UTI, status post suprapubic catheter placement 09/23/2023-presented with fever, tachycardia, leukocytosis, initial lactate greater than 2.  Subsequent lactate normalized at 1.4..  On empiric IV Rocephin .  Temperature max of 98.9 F.  Mild leukocytosis today at 13.7.  Blood cultures negative in less than 12 hours.  Follow urine cultures.  Mild clinical improvement at this time.  Lower abdominal pain, bladder spasms.  Will try tramadol  for the pain.  Continue to treat for infection.   BPH-now status post suprapubic urinary catheter.  Symptoms of suprapubic discomfort.  Will try antispasmodic.   CKD stage IIIb-initial creatinine of 1.6.  Creatinine today at 1.3.  Likely at baseline.  Check BMP in AM.    Hypothyroidism-continue Synthroid   Mild hypokalemia.  Potassium was 3.3 today.  Will replace orally.  Check levels in AM.   History of nonischemic cardiomyopathy- Compensated.  Continue Coreg    Class I obesity.Body mass index is 31.39 kg/m.  Would benefit from lifestyle modification.  DVT prophylaxis: heparin  injection 5,000 Units Start: 09/24/23 1400   Disposition: Home likely in 1 to 2 days  Status is: Observation The patient will require care spanning > 2 midnights and should be moved to inpatient because: Pending clinical improvement, pending urine cultures.  IV antibiotic    Code Status:     Code Status: Full Code  Family Communication: Spoke with the patient's daughter at bedside  Consultants: None  Procedures: None  Anti-infectives:  Rocephin  IV  Anti-infectives (From admission, onward)    Start     Dose/Rate Route Frequency Ordered Stop   09/24/23 2000  cefTRIAXone  (ROCEPHIN ) 1 g in sodium chloride  0.9 % 100 mL IVPB        1 g 200 mL/hr over 30 Minutes Intravenous Every 24 hours 09/24/23 1149     09/24/23 0830  ceFEPIme  (MAXIPIME ) 2 g in sodium chloride  0.9 % 100 mL IVPB  Status:  Discontinued        2 g 200 mL/hr over 30 Minutes Intravenous Every 12 hours 09/24/23 0812 09/24/23 1149   09/23/23 2000  ceFEPIme  (MAXIPIME ) 2 g in sodium chloride  0.9 % 100 mL IVPB        2 g 200 mL/hr over 30 Minutes Intravenous  Once 09/23/23 1956 09/23/23 2130   09/23/23 2000  metroNIDAZOLE  (FLAGYL ) IVPB 500 mg  500 mg 100 mL/hr over 60 Minutes Intravenous  Once 09/23/23 1956 09/23/23 2143   09/23/23 2000  vancomycin  (VANCOCIN ) IVPB 1000 mg/200 mL premix        1,000 mg 200 mL/hr over 60 Minutes Intravenous  Once 09/23/23 1956 09/23/23 2259      Subjective: Today, patient was seen and examined at bedside.  Complains of lower abdominal discomfort/spasms at the site of catheter.  Patient's daughter at bedside.  Normally able to ambulate at home.  Denies any nausea  vomiting fever or chills.  Objective: Vitals:   09/24/23 1959 09/25/23 0418  BP: (!) 147/71 (!) 142/78  Pulse: (!) 103 94  Resp: 20 19  Temp: 98.7 F (37.1 C) 98.9 F (37.2 C)  SpO2: 96% 94%    Intake/Output Summary (Last 24 hours) at 09/25/2023 1201 Last data filed at 09/25/2023 0935 Gross per 24 hour  Intake 1540.83 ml  Output 2650 ml  Net -1109.17 ml   Filed Weights   09/24/23 1215  Weight: 105 kg   Body mass index is 31.39 kg/m.   Physical Exam: GENERAL: Patient is alert awake and oriented. Not in obvious distress.  Obese built HENT: No scleral pallor or icterus. Pupils equally reactive to light. Oral mucosa is moist NECK: is supple, no gross swelling noted. CHEST: Clear to auscultation. No crackles or wheezes.   CVS: S1 and S2 heard, no murmur. Regular rate and rhythm.  ABDOMEN: Soft, tenderness over the lower abdomen, suprapubic catheter in place with mild erythema but no obvious induration.  Bowel sounds are present. EXTREMITIES: No edema. CNS: Cranial nerves are intact. No focal motor deficits. SKIN: warm and dry without rashes.  Data Review: I have personally reviewed the following laboratory data and studies,  CBC: Recent Labs  Lab 09/23/23 0757 09/23/23 1937 09/25/23 0523  WBC 8.8 15.7* 13.7*  HGB 12.9* 14.0 11.3*  HCT 38.6* 42.0 34.7*  MCV 87.9 87.9 89.2  PLT 385 416* 346   Basic Metabolic Panel: Recent Labs  Lab 09/23/23 0757 09/23/23 1937 09/25/23 0523  NA 137 136 137  K 3.8 4.2 3.3*  CL 103 101 105  CO2 24 22 23   GLUCOSE 96 149* 78  BUN 14 16 14   CREATININE 1.51* 1.69* 1.38*  CALCIUM  8.7* 9.4 8.3*   Liver Function Tests: Recent Labs  Lab 09/23/23 2012  AST 26  ALT 10  ALKPHOS 72  BILITOT 0.7  PROT 6.5  ALBUMIN 3.5   No results for input(s): LIPASE, AMYLASE in the last 168 hours. No results for input(s): AMMONIA in the last 168 hours. Cardiac Enzymes: No results for input(s): CKTOTAL, CKMB, CKMBINDEX,  TROPONINI in the last 168 hours. BNP (last 3 results) No results for input(s): BNP in the last 8760 hours.  ProBNP (last 3 results) No results for input(s): PROBNP in the last 8760 hours.  CBG: Recent Labs  Lab 09/23/23 0822 09/23/23 1018  GLUCAP 103* 106*   Recent Results (from the past 240 hours)  Blood Culture (routine x 2)     Status: None (Preliminary result)   Collection Time: 09/23/23  8:11 PM   Specimen: Left Antecubital; Blood  Result Value Ref Range Status   Specimen Description   Final    LEFT ANTECUBITAL Performed at [facility], [address] [place], [place] [address]    Special Requests   Final    BOTTLES DRAWN AEROBIC AND ANAEROBIC Blood Culture adequate volume Performed at [facility], [address], [place],  [place] [address]    Culture   Final    NO GROWTH < 24 HOURS Performed at [facility], [address] [place], [place] [address]    Report Status PENDING  Incomplete  Resp panel by RT-PCR (RSV, Flu A&B, Covid) Anterior Nasal Swab     Status: None   Collection Time: 09/23/23  8:12 PM   Specimen: Anterior Nasal Swab  Result Value Ref Range Status   SARS Coronavirus 2 by RT PCR NEGATIVE NEGATIVE Final    Comment: (NOTE) SARS-CoV-2 target nucleic acids are NOT DETECTED.  The SARS-CoV-2 RNA is generally detectable in upper respiratory specimens during the acute phase of infection. The lowest concentration of SARS-CoV-2 viral copies this assay can detect is 138 copies/mL. A negative result does not preclude SARS-Cov-2 infection and should not be used as the sole basis for treatment or other patient management decisions. A negative result may occur with  improper specimen collection/handling, submission of specimen other than nasopharyngeal swab, presence of viral mutation(s) within the areas targeted by this assay, and inadequate number of viral copies(<138 copies/mL). A negative result must be  combined with clinical observations, patient history, and epidemiological information. The expected result is Negative.  Fact Sheet for Patients:  BloggerCourse.com  Fact Sheet for Healthcare Providers:  SeriousBroker.it  This test is no t yet approved or cleared by the [place]  FDA and  has been authorized for detection and/or diagnosis of SARS-CoV-2 by FDA under an Emergency Use Authorization (EUA). This EUA will remain  in effect (meaning this test can be used) for the duration of the COVID-19 declaration under Section 564(b)(1) of the Act, 21 U.S.C.section 360bbb-3(b)(1), unless the authorization is terminated  or revoked sooner.       Influenza A by PCR NEGATIVE NEGATIVE Final   Influenza B by PCR NEGATIVE NEGATIVE Final    Comment: (NOTE) The Xpert Xpress SARS-CoV-2/FLU/RSV plus assay is intended as an aid in the diagnosis of influenza from Nasopharyngeal swab specimens and should not be used as a sole basis for treatment. Nasal washings and aspirates are unacceptable for Xpert Xpress SARS-CoV-2/FLU/RSV testing.  Fact Sheet for Patients: BloggerCourse.com  Fact Sheet for Healthcare Providers: SeriousBroker.it  This test is not yet approved or cleared by the [place]  FDA and has been authorized for detection and/or diagnosis of SARS-CoV-2 by FDA under an Emergency Use Authorization (EUA). This EUA will remain in effect (meaning this test can be used) for the duration of the COVID-19 declaration under Section 564(b)(1) of the Act, 21 U.S.C. section 360bbb-3(b)(1), unless the authorization is terminated or revoked.     Resp Syncytial Virus by PCR NEGATIVE NEGATIVE Final    Comment: (NOTE) Fact Sheet for Patients: BloggerCourse.com  Fact Sheet for Healthcare Providers: SeriousBroker.it  This test is not yet  approved or cleared by the [place]  FDA and has been authorized for detection and/or diagnosis of SARS-CoV-2 by FDA under an Emergency Use Authorization (EUA). This EUA will remain in effect (meaning this test can be used) for the duration of the COVID-19 declaration under Section 564(b)(1) of the Act, 21 U.S.C. section 360bbb-3(b)(1), unless the authorization is terminated or revoked.  Performed at [facility], [address], [place], [place] [address]   Blood Culture (routine x 2)     Status: None (Preliminary result)   Collection Time: 09/23/23  8:12 PM   Specimen: BLOOD RIGHT HAND  Result Value Ref Range Status   Specimen Description   Final  BLOOD RIGHT HAND Performed at [facility], [address] [place], [place] [address]    Special Requests   Final    BOTTLES DRAWN AEROBIC AND ANAEROBIC Blood Culture adequate volume Performed at [facility], [address] [place], [place] [address]    Culture   Final    NO GROWTH < 24 HOURS Performed at [facility], [address] [place], [place] [address]    Report Status PENDING  Incomplete     Studies: DG Chest Port 1 View Result Date: 09/23/2023 CLINICAL DATA:  Chest pain EXAM: PORTABLE CHEST 1 VIEW COMPARISON:  None Available. FINDINGS: Normal mediastinum and cardiac silhouette. Normal pulmonary vasculature. No evidence of effusion, infiltrate, or pneumothorax. No acute bony abnormality. IMPRESSION: No acute cardiopulmonary process. Electronically Signed   By: Jackquline Boxer M.D.   On: 09/23/2023 19:57      Vernal Alstrom, MD  [facility] 09/25/2023  If 7PM-7AM, please contact night-coverage

## 2023-09-25 NOTE — Plan of Care (Signed)

## 2023-09-25 NOTE — Care Management CC44 (Signed)
 Condition Code 44 Documentation Completed  Patient Details  Name: Bruce [name] MRN: [id number] Date of Birth: 1950/07/21   Condition Code 44 given:  Yes Patient signature on Condition Code 44 notice:  Yes Documentation of 2 MD's agreement:  Yes Code 44 added to claim:  Yes    [facility], RN 09/25/2023, 11:41 AM

## 2023-09-26 ENCOUNTER — Other Ambulatory Visit (HOSPITAL_COMMUNITY): Payer: Self-pay

## 2023-09-26 DIAGNOSIS — A419 Sepsis, unspecified organism: Secondary | ICD-10-CM | POA: Diagnosis not present

## 2023-09-26 DIAGNOSIS — N390 Urinary tract infection, site not specified: Secondary | ICD-10-CM | POA: Diagnosis not present

## 2023-09-26 LAB — BASIC METABOLIC PANEL WITH GFR
Anion gap: 5 (ref 5–15)
BUN: 12 mg/dL (ref 8–23)
CO2: 24 mmol/L (ref 22–32)
Calcium: 8.3 mg/dL — ABNORMAL LOW (ref 8.9–10.3)
Chloride: 106 mmol/L (ref 98–111)
Creatinine, Ser: 1.19 mg/dL (ref 0.61–1.24)
GFR, Estimated: >60 mL/min (ref >60)
Glucose, Bld: 88 mg/dL (ref 70–99)
Potassium: 3.3 mmol/L — ABNORMAL LOW (ref 3.5–5.1)
Sodium: 135 mmol/L (ref 135–145)

## 2023-09-26 LAB — CBC
HCT: 36.4 % — ABNORMAL LOW (ref 39.0–52.0)
Hemoglobin: 11.5 g/dL — ABNORMAL LOW (ref 13.0–17.0)
MCH: 28.2 pg (ref 26.0–34.0)
MCHC: 31.6 g/dL (ref 30.0–36.0)
MCV: 89.2 fL (ref 80.0–100.0)
Platelets: 348 K/uL (ref 150–400)
RBC: 4.08 MIL/uL — ABNORMAL LOW (ref 4.22–5.81)
RDW: 15.3 % (ref 11.5–15.5)
WBC: 12.2 K/uL — ABNORMAL HIGH (ref 4.0–10.5)
nRBC: 0 % (ref 0.0–0.2)

## 2023-09-26 LAB — MAGNESIUM: Magnesium: 2 mg/dL (ref 1.7–2.4)

## 2023-09-26 MED ORDER — CEPHALEXIN 500 MG PO CAPS
500.0000 mg | ORAL_CAPSULE | Freq: Two times a day (BID) | ORAL | 0 refills | Status: AC
  Filled 2023-09-26: qty 10, 5d supply, fill #0

## 2023-09-26 MED ORDER — TRAMADOL HCL 50 MG PO TABS
50.0000 mg | ORAL_TABLET | Freq: Four times a day (QID) | ORAL | 0 refills | Status: DC | PRN
  Filled 2023-09-26: qty 10, 3d supply, fill #0

## 2023-09-26 MED ORDER — POTASSIUM CHLORIDE CRYS ER 20 MEQ PO TBCR
40.0000 meq | EXTENDED_RELEASE_TABLET | Freq: Two times a day (BID) | ORAL | Status: DC
  Administered 2023-09-26: 40 meq via ORAL
  Filled 2023-09-26: qty 2

## 2023-09-26 NOTE — Progress Notes (Signed)
 Pt has a restful night. No pain. Dressing was changed.

## 2023-09-26 NOTE — Plan of Care (Signed)

## 2023-09-26 NOTE — Discharge Summary (Signed)
 Physician Discharge Summary  Bruce [name] [name] FMW:[id number] DOB: Jun 14, 1950 DOA: 09/23/2023  PCP: Katrinka Garnette KIDD, MD  Admit date: 09/23/2023 Discharge date: 09/26/2023  Admitted From: Home  Discharge disposition: Home   Recommendations for Outpatient Follow-Up:   Follow up with your primary care provider in one week.  Check CBC, BMP, magnesium in the next visit Recommend [facility] in 2 weeks or so. Keep appointment with interventional radiology for suprapubic catheter follow-up.   Discharge Diagnosis:   Principal Problem:   Sepsis secondary to UTI [facility]) Active Problems:   Sepsis (HCC)   Discharge Condition: Improved.  Diet recommendation:   Regular.  Wound care: None.  Code status: Full.   History of Present Illness:    Bruce [name] is a 73 y.o. 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  Of note, patient was recently treated with Keflex  for UTI as outpatient and had completed 5 days ago and had improved.  With this new symptom, patient presented to [facility] ED when he was noted to have sepsis.  Initial labs showed leukocytosis with WBC at 15.7.  Creatinine elevated at 1.6.  Lactate was 2.5.  Patient was negative for COVID influenza and RSV.  Received IV fluids antibiotic and was considered for admission to the hospital for further evaluation and treatment.   Hospital Course:   Following conditions were addressed during hospitalization as listed below,  Severe sepsis secondary to UTI, status post suprapubic catheter placement 09/23/2023-presented with fever,  tachycardia, leukocytosis, initial lactate greater than 2.  Subsequent lactate normalized at 1.4..  Patient empirically received IV Rocephin .  Urine cultures have been negative so far.  Patient is afebrile.  Had leukocytosis on presentation which has been trending down.  Clinically has significantly improved.  Blood cultures negative so far as well.  At this time patient will be given 5 more days of Keflex  and discharged to complete 7-day course of antibiotic.  Patient was advised to follow-up with interventional radiology as has been scheduled and to follow-up with urology in 2 weeks.  Spoke with the patient's daughter at bedside regarding this.    Lower abdominal pain, bladder spasms.  Improved with tramadol .  Will give a few doses of tramadol  on discharge for bladder spasm related to the catheter.   BPH-now status post suprapubic urinary catheter.  Feels better today.   CKD stage IIIb-initial creatinine of 1.6.  Creatinine today at 1.1.  Likely at baseline   Hypothyroidism-continue Synthroid    Mild hypokalemia.  Potassium was 3.3 today.  Will give 40 mEq of potassium was given.  Discharge.  Patient does have potassium supplements at home and was encouraged to continue to use it on discharge.   History of nonischemic cardiomyopathy- Compensated.  Continue Coreg     Class I obesity.Body mass index is 31.39 kg/m.  Would benefit from lifestyle modification.  Disposition.  At this time, patient is stable for disposition home with outpatient PCP, urology and IR follow-up.  Medical Consultants:   None.  Procedures:    None Subjective:   Today, patient was seen and examined at bedside.  Patient states that he feels much better today.  No nausea vomiting  abdominal pain fever chills or rigor.  Has had some ambulation.  Patient's daughter at bedside.  Discharge Exam:   Vitals:   09/25/23 2008 09/26/23 0531  BP: (!) 148/84 (!) 142/83  Pulse: 84 84  Resp: 18 18  Temp: 98.8 F (37.1 C)  98.7 F (37.1 C)  SpO2: 99% 95%   Vitals:   09/25/23 0418 09/25/23 1341 09/25/23 2008 09/26/23 0531  BP: (!) 142/78 114/70 (!) 148/84 (!) 142/83  Pulse: 94 84 84 84  Resp: 19 20 18 18   Temp: 98.9 F (37.2 C) 98.4 F (36.9 C) 98.8 F (37.1 C) 98.7 F (37.1 C)  TempSrc: Oral Oral Oral Oral  SpO2: 94% 95% 99% 95%  Weight:      Height:       Body mass index is 31.39 kg/m.   General: Alert awake, not in obvious distress, obese built HENT: pupils equally reacting to light,  No scleral pallor or icterus noted. Oral mucosa is moist.  Chest:  Clear breath sounds. No crackles or wheezes.  CVS: S1 &S2 heard. No murmur.  Regular rate and rhythm. Abdomen: Soft, nontender, nondistended.  Bowel sounds are heard.  Suprapubic catheter in place. Extremities: No cyanosis, clubbing or edema.  Peripheral pulses are palpable. Psych: Alert, awake and oriented, normal mood CNS:  No cranial nerve deficits.  Power equal in all extremities.   Skin: Warm and dry.  No rashes noted.  The results of significant diagnostics from this hospitalization (including imaging, microbiology, ancillary and laboratory) are listed below for reference.     Diagnostic Studies:   DG Chest Port 1 View Result Date: 09/23/2023 CLINICAL DATA:  Chest pain EXAM: PORTABLE CHEST 1 VIEW COMPARISON:  None Available. FINDINGS: Normal mediastinum and cardiac silhouette. Normal pulmonary vasculature. No evidence of effusion, infiltrate, or pneumothorax. No acute bony abnormality. IMPRESSION: No acute cardiopulmonary process. Electronically Signed   By: Jackquline Boxer M.D.   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 Prostate artery embolization performed in April of 2025 was unsuccessful at relieving urinary retention. Therefore, patient presents for placement of a suprapubic catheter. EXAM:  Placement of suprapubic catheter utilizing ultrasound and fluoroscopic guidance COMPARISON:  None Available. MEDICATIONS: None. ANESTHESIA/SEDATION: 100 mcg fentanyl  administered for pain control. This does not constitute moderate sedation. CONTRAST:  25 mL Omnipaque  300-administered into the collecting system(s) FLUOROSCOPY: Radiation Exposure Index (as provided by the fluoroscopic device): 135 mGy Kerma COMPLICATIONS: None immediate. PROCEDURE: Informed written consent was obtained from the patient after a thorough discussion of the procedural risks, benefits and alternatives. All questions were addressed. Maximal Sterile Barrier Technique was utilized including caps, mask, sterile gowns, sterile gloves, sterile drape, hand hygiene and skin antiseptic. A timeout was performed prior to the initiation of the procedure. Ultrasound was used to interrogate the bladder. Bladder is under distended due to the presence of the Foley catheter despite clamping of the catheter. Therefore, sterile saline was instilled through the Foley catheter to distend bladder. A suitable skin entry site was selected and marked. Local anesthesia was attained by infiltration with 1% lidocaine . A small dermatotomy was made. Under real-time ultrasound guidance, an 18 gauge trocar needle was advanced through the anterior abdominal wall and into the bladder lumen. The 0.035 wire was then advanced into the bladder. A 7 x 100 Athletis balloon was placed coaxially through a 16 French [place]ill tip Foley catheter. The angioplasty balloon and Foley  catheter assembly was then advanced over the wire and the balloon positioned across the percutaneous tract. The balloon was inflated to full effacement. As the balloon was deflated, the balloon and Foley catheter assembly was advanced as a unit over the wire and into the bladder lumen. The balloon was deflated and an attempt was made at removal, however the balloon would not deflate properly and became stuck  in the tip of the Foley catheter. Therefore, a road runner hydrophilic wire was advanced adjacent to the angioplasty catheter and through the side-hole of the Foley catheter into the bladder to maintain the tract. Next, the bladder and the stuck balloon were removed as a unit. The balloon was removed and disposed of. A Kumpe the catheter was advanced over the hydrophilic catheter into the bladder. Contrast was injected confirming that the catheter was in fact within the bladder lumen. The hydrophilic wire was then exchanged for a superstiff Amplatz wire. A new 7 x 100 Athletis balloon was advanced to the Council tip Foley catheter and the procedure was repeated. This time, the balloon deflated properly and was easily removed leaving the Foley catheter within the bladder. The retention balloon was filled with 10 mL saline. Contrast was injected through the Foley catheter confirming its position within the urinary bladder. IMPRESSION: Successful placement of a 16 French Councill tip Foley catheter via suprapubic approach. Patient to return Interventional Radiology in 6-8 weeks for first catheter exchange over a wire. Electronically Signed   By: Wilkie Lent M.D.   On: 09/23/2023 13:06     Labs:   Basic Metabolic Panel: Recent Labs  Lab 09/23/23 0757 09/23/23 1937 09/25/23 0523 09/26/23 0517  NA 137 136 137 135  K 3.8 4.2 3.3* 3.3*  CL 103 101 105 106  CO2 24 22 23 24   GLUCOSE 96 149* 78 88  BUN 14 16 14 12   CREATININE 1.51* 1.69* 1.38* 1.19  CALCIUM  8.7* 9.4 8.3* 8.3*  MG  --   --   --  2.0   GFR Estimated Creatinine Clearance: 68.2 mL/min (by C-G formula based on SCr of 1.19 mg/dL). Liver Function Tests: Recent Labs  Lab 09/23/23 2012  AST 26  ALT 10  ALKPHOS 72  BILITOT 0.7  PROT 6.5  ALBUMIN 3.5   No results for input(s): LIPASE, AMYLASE in the last 168 hours. No results for input(s): AMMONIA in the last 168 hours. Coagulation profile Recent Labs  Lab  09/23/23 0757 09/23/23 2012  INR 1.0 1.0    CBC: Recent Labs  Lab 09/23/23 0757 09/23/23 1937 09/25/23 0523 09/26/23 0517  WBC 8.8 15.7* 13.7* 12.2*  HGB 12.9* 14.0 11.3* 11.5*  HCT 38.6* 42.0 34.7* 36.4*  MCV 87.9 87.9 89.2 89.2  PLT 385 416* 346 348   Cardiac Enzymes: No results for input(s): CKTOTAL, CKMB, CKMBINDEX, TROPONINI in the last 168 hours. BNP: Invalid input(s): POCBNP CBG: Recent Labs  Lab 09/23/23 0822 09/23/23 1018  GLUCAP 103* 106*   D-Dimer No results for input(s): DDIMER in the last 72 hours. Hgb A1c No results for input(s): HGBA1C in the last 72 hours. Lipid Profile No results for input(s): CHOL, HDL, LDLCALC, TRIG, CHOLHDL, LDLDIRECT in the last 72 hours. Thyroid  function studies No results for input(s): TSH, T4TOTAL, T3FREE, THYROIDAB in the last 72 hours.  Invalid input(s): FREET3 Anemia work up No results for input(s): VITAMINB12, FOLATE, FERRITIN, TIBC, IRON, RETICCTPCT in the last 72 hours. Microbiology Recent Results (from the past 240 hours)  Blood Culture (routine  x 2)     Status: None (Preliminary result)   Collection Time: 09/23/23  8:11 PM   Specimen: Left Antecubital; Blood  Result Value Ref Range Status   Specimen Description   Final    LEFT ANTECUBITAL Performed at [facility], [address], [place], [place] [address]    Special Requests   Final    BOTTLES DRAWN AEROBIC AND ANAEROBIC Blood Culture adequate volume Performed at [facility], [address], [place], [place] [address]    Culture   Final    NO GROWTH 2 DAYS Performed at [facility], [address], [place], [place] [address]    Report Status PENDING  Incomplete  Resp panel by RT-PCR (RSV, Flu A&B, Covid) Anterior Nasal Swab     Status: None   Collection Time: 09/23/23  8:12 PM   Specimen: Anterior Nasal Swab  Result Value Ref Range Status   SARS Coronavirus 2  by RT PCR NEGATIVE NEGATIVE Final    Comment: (NOTE) SARS-CoV-2 target nucleic acids are NOT DETECTED.  The SARS-CoV-2 RNA is generally detectable in upper respiratory specimens during the acute phase of infection. The lowest concentration of SARS-CoV-2 viral copies this assay can detect is 138 copies/mL. A negative result does not preclude SARS-Cov-2 infection and should not be used as the sole basis for treatment or other patient management decisions. A negative result may occur with  improper specimen collection/handling, submission of specimen other than nasopharyngeal swab, presence of viral mutation(s) within the areas targeted by this assay, and inadequate number of viral copies(<138 copies/mL). A negative result must be combined with clinical observations, patient history, and epidemiological information. The expected result is Negative.  Fact Sheet for Patients:  BloggerCourse.com  Fact Sheet for Healthcare Providers:  SeriousBroker.it  This test is no t yet approved or cleared by the [place]  FDA and  has been authorized for detection and/or diagnosis of SARS-CoV-2 by FDA under an Emergency Use Authorization (EUA). This EUA will remain  in effect (meaning this test can be used) for the duration of the COVID-19 declaration under Section 564(b)(1) of the Act, 21 U.S.C.section 360bbb-3(b)(1), unless the authorization is terminated  or revoked sooner.       Influenza A by PCR NEGATIVE NEGATIVE Final   Influenza B by PCR NEGATIVE NEGATIVE Final    Comment: (NOTE) The Xpert Xpress SARS-CoV-2/FLU/RSV plus assay is intended as an aid in the diagnosis of influenza from Nasopharyngeal swab specimens and should not be used as a sole basis for treatment. Nasal washings and aspirates are unacceptable for Xpert Xpress SARS-CoV-2/FLU/RSV testing.  Fact Sheet for Patients: BloggerCourse.com  Fact  Sheet for Healthcare Providers: SeriousBroker.it  This test is not yet approved or cleared by the [place]  FDA and has been authorized for detection and/or diagnosis of SARS-CoV-2 by FDA under an Emergency Use Authorization (EUA). This EUA will remain in effect (meaning this test can be used) for the duration of the COVID-19 declaration under Section 564(b)(1) of the Act, 21 U.S.C. section 360bbb-3(b)(1), unless the authorization is terminated or revoked.     Resp Syncytial Virus by PCR NEGATIVE NEGATIVE Final    Comment: (NOTE) Fact Sheet for Patients: BloggerCourse.com  Fact Sheet for Healthcare Providers: SeriousBroker.it  This test is not yet approved or cleared by the [place]  FDA and has been authorized for detection and/or diagnosis of SARS-CoV-2 by FDA under an Emergency Use Authorization (EUA). This EUA will remain in effect (meaning this test  can be used) for the duration of the COVID-19 declaration under Section 564(b)(1) of the Act, 21 U.S.C. section 360bbb-3(b)(1), unless the authorization is terminated or revoked.  Performed at [facility], [address], [place], [place] [address]   Blood Culture (routine x 2)     Status: None (Preliminary result)   Collection Time: 09/23/23  8:12 PM   Specimen: BLOOD RIGHT HAND  Result Value Ref Range Status   Specimen Description   Final    BLOOD RIGHT HAND Performed at [facility], [address] [place], [place] [address]    Special Requests   Final    BOTTLES DRAWN AEROBIC AND ANAEROBIC Blood Culture adequate volume Performed at [facility], [address], [place], [place] [address]    Culture   Final    NO GROWTH 2 DAYS Performed at [facility], [address] [place], [place] [address]    Report Status PENDING  Incomplete  Urine Culture (for pregnant,  neutropenic or urologic patients or patients with an indwelling urinary catheter)     Status: None   Collection Time: 09/24/23 12:10 PM   Specimen: Urine, Catheterized  Result Value Ref Range Status   Specimen Description   Final    URINE, CATHETERIZED Performed at [facility], [address], [place], [place] [address]    Special Requests   Final    NONE Performed at [facility], [address], [place], [place] [address]    Culture   Final    NO GROWTH Performed at [facility], [address], [place], [place] [address]    Report Status 09/25/2023 FINAL  Final     Discharge Instructions:   Discharge Instructions     Call MD for:  persistant nausea and vomiting   Complete by: As directed    Call MD for:  severe uncontrolled pain   Complete by: As directed    Call MD for:  temperature >100.4   Complete by: As directed    Diet - low sodium heart healthy   Complete by: As directed    Discharge instructions   Complete by: As directed    Follow-up with your primary care provider in 1 week.  Check blood work at that time.  Complete the course of antibiotic.  Increase fluid intake.  Seek medical attention for worsening symptoms. Follow up with [facility] in 2 weeks or so.  Follow-up with interventional radiology for prepubic catheter management as scheduled by the clinic.   Increase activity slowly   Complete by: As directed    No wound care   Complete by: As directed       Allergies as of 09/26/2023       Reactions   Lisinopril  Cough   Benzodiazepines Other (See Comments)   Per daughter, benzo's make dementia worse and really hard to come down off these, not a true allergy but would rather not take them if avoidable.    Coconut Flavoring Agent (non-screening) Other (See Comments)   Does not eat because of crohn's    Food Other (See Comments)   Nuts-crohn's flares        Medication List     TAKE these medications     acetaminophen  500 MG tablet Commonly known as: TYLENOL  Take 1 tablet (500 mg total) by mouth every 6 (six) hours as needed. What changed: reasons to take this   aspirin  81 MG chewable tablet Chew  81 mg by mouth daily as needed for mild pain (pain score 1-3) or moderate pain (pain score 4-6).   carvedilol  12.5 MG tablet Commonly known as: COREG  TAKE 1 TABLET (12.5MG  TOTAL) BY MOUTH TWICE A DAY WITH MEALS What changed: See the new instructions.   cephALEXin  500 MG capsule Commonly known as: KEFLEX  Take 1 capsule (500 mg total) by mouth 2 (two) times daily for 5 days.   GARLIC PO Take 1 capsule by mouth 3 (three) times daily before meals.   Gemtesa 75 MG Tabs Generic drug: Vibegron Take 75 mg by mouth at bedtime.   levothyroxine  200 MCG tablet Commonly known as: SYNTHROID  TAKE 1 TABLET (200 MCG TOTAL) BY MOUTH DAILY BEFORE BREAKFAST.   losartan  25 MG tablet Commonly known as: COZAAR  TAKE 1 TABLET (25 MG TOTAL) BY MOUTH IN THE MORNING   pantoprazole  40 MG tablet Commonly known as: PROTONIX  Take 1 tablet (40 mg total) by mouth daily. What changed:  when to take this reasons to take this   polyethylene glycol 17 g packet Commonly known as: MIRALAX  / GLYCOLAX  Take 17 g by mouth daily as needed (constipation.).   potassium gluconate 595 (99 K) MG Tabs tablet Take 595 mg by mouth in the morning.   ranolazine  500 MG 12 hr tablet Commonly known as: RANEXA  Take 500 mg by mouth 2 (two) times daily.   senna 8.6 MG Tabs tablet Commonly known as: SENOKOT Take 1 tablet by mouth daily as needed for mild constipation.   Systane 0.4-0.3 % Soln Generic drug: Polyethyl Glycol-Propyl Glycol Place 1-2 drops into both eyes at bedtime.   tamsulosin  0.4 MG Caps capsule Commonly known as: FLOMAX  TAKE 1 CAPSULE BY MOUTH TWICE A DAY   traMADol  50 MG tablet Commonly known as: ULTRAM  Take 1 tablet (50 mg total) by mouth every 6 (six) hours as needed for moderate pain (pain score  4-6) or severe pain (pain score 7-10) (spasms).   VITAMIN B12 PO Take 1 tablet by mouth in the morning.        Follow-up Information     Katrinka Garnette KIDD, MD Follow up in 1 week(s).   Specialty: Family Medicine Contact information: [address] [place] [address] [phone]                  Time coordinating discharge: 39 minutes  Signed:  Rashawnda Gaba  [facility] 09/26/2023, 4:09 PM

## 2023-09-26 NOTE — TOC Transition Note (Signed)
 Transition of Care [facility]) - Discharge Note   Patient Details  Name: Bruce [name] MRN: [id number] Date of Birth: 10/18/1950  [facility] CM/SW Contact:  Bascom Service, RN Phone Number: 09/26/2023, 10:13 AM   Clinical Narrative: d/c home.      Final next level of care: Home/Self Care Barriers to Discharge: No Barriers Identified   Patient Goals and CMS Choice Patient states their goals for this hospitalization and ongoing recovery are:: Home CMS Medicare.gov Compare Post Acute Care list provided to:: Patient Choice offered to / list presented to : Patient [place] ownership interest in [facility].provided to:: Patient    Discharge Placement                       Discharge Plan and Services Additional resources added to the After Visit Summary for     Discharge Planning Services: CM Consult                                 Social Drivers of Health (SDOH) Interventions SDOH Screenings   Food Insecurity: No Food Insecurity (09/24/2023)  Housing: Low Risk  (09/24/2023)  Transportation Needs: No Transportation Needs (09/24/2023)  Utilities: Not At Risk (09/24/2023)  Depression (PHQ2-9): Low Risk  (09/27/2022)  Financial Resource Strain: Low Risk  (04/26/2023)   Received from [facility]  Physical Activity: Inactive (09/26/2022)  Social Connections: Moderately Integrated (09/24/2023)  Stress: No Stress Concern Present (09/26/2022)  Tobacco Use: Medium Risk (09/24/2023)  Health Literacy: Adequate Health Literacy (09/27/2022)     Readmission Risk Interventions     No data to display

## 2023-09-29 LAB — CULTURE, BLOOD (ROUTINE X 2)
Culture: NO GROWTH
Culture: NO GROWTH
Special Requests: ADEQUATE
Special Requests: ADEQUATE

## 2023-10-07 ENCOUNTER — Inpatient Hospital Stay: Admitting: Internal Medicine

## 2023-10-07 DIAGNOSIS — R338 Other retention of urine: Secondary | ICD-10-CM | POA: Diagnosis not present

## 2023-10-10 ENCOUNTER — Ambulatory Visit

## 2023-10-14 ENCOUNTER — Ambulatory Visit (INDEPENDENT_AMBULATORY_CARE_PROVIDER_SITE_OTHER): Admitting: Internal Medicine

## 2023-10-14 ENCOUNTER — Encounter: Payer: Self-pay | Admitting: Internal Medicine

## 2023-10-14 VITALS — BP 130/72 | HR 80 | Temp 98.0°F | Ht 72.0 in | Wt 212.8 lb

## 2023-10-14 DIAGNOSIS — N170 Acute kidney failure with tubular necrosis: Secondary | ICD-10-CM

## 2023-10-14 DIAGNOSIS — R310 Gross hematuria: Secondary | ICD-10-CM

## 2023-10-14 DIAGNOSIS — N390 Urinary tract infection, site not specified: Secondary | ICD-10-CM | POA: Diagnosis not present

## 2023-10-14 DIAGNOSIS — A419 Sepsis, unspecified organism: Secondary | ICD-10-CM | POA: Diagnosis not present

## 2023-10-14 DIAGNOSIS — Z9359 Other cystostomy status: Secondary | ICD-10-CM | POA: Diagnosis not present

## 2023-10-14 LAB — CBC WITH DIFFERENTIAL/PLATELET
Basophils Absolute: 0.1 K/uL (ref 0.0–0.1)
Basophils Relative: 1.2 % (ref 0.0–3.0)
Eosinophils Absolute: 0.5 K/uL (ref 0.0–0.7)
Eosinophils Relative: 5.4 % — ABNORMAL HIGH (ref 0.0–5.0)
HCT: 38.9 % — ABNORMAL LOW (ref 39.0–52.0)
Hemoglobin: 12.8 g/dL — ABNORMAL LOW (ref 13.0–17.0)
Lymphocytes Relative: 22.9 % (ref 12.0–46.0)
Lymphs Abs: 2 K/uL (ref 0.7–4.0)
MCHC: 32.9 g/dL (ref 30.0–36.0)
MCV: 87.2 fl (ref 78.0–100.0)
Monocytes Absolute: 0.9 K/uL (ref 0.1–1.0)
Monocytes Relative: 10.9 % (ref 3.0–12.0)
Neutro Abs: 5.1 K/uL (ref 1.4–7.7)
Neutrophils Relative %: 59.6 % (ref 43.0–77.0)
Platelets: 552 K/uL — ABNORMAL HIGH (ref 150.0–400.0)
RBC: 4.46 Mil/uL (ref 4.22–5.81)
RDW: 15.7 % — ABNORMAL HIGH (ref 11.5–15.5)
WBC: 8.6 K/uL (ref 4.0–10.5)

## 2023-10-14 LAB — BASIC METABOLIC PANEL WITH GFR
BUN: 13 mg/dL (ref 6–23)
CO2: 29 meq/L (ref 19–32)
Calcium: 9.2 mg/dL (ref 8.4–10.5)
Chloride: 101 meq/L (ref 96–112)
Creatinine, Ser: 1.35 mg/dL (ref 0.40–1.50)
GFR: 51.77 mL/min — ABNORMAL LOW (ref >60.00)
Glucose, Bld: 81 mg/dL (ref 70–99)
Potassium: 4.9 meq/L (ref 3.5–5.1)
Sodium: 138 meq/L (ref 135–145)

## 2023-10-14 LAB — MAGNESIUM: Magnesium: 2 mg/dL (ref 1.5–2.5)

## 2023-10-14 NOTE — Patient Instructions (Signed)
 It was a pleasure seeing you today! Your health and satisfaction are our top priorities.  Bernardino Cone, MD  VISIT SUMMARY: You had a follow-up appointment today after your recent hospital discharge for sepsis. We discussed your current condition, including your urinary retention, bladder spasms, and the status of your suprapubic catheter. We also reviewed your recovery from sepsis and acute kidney injury.  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 You mentioned experiencing occasional bladder spasms, which may be due to the catheter's positioning. We have scheduled a catheter replacement for September 2nd and will check for any infections with a urinalysis. If you experience bladder spasms, please check the catheter for any kinks. We will share your lab results with your urologist, Dr. Ellen, and discuss the possibility of reversing the catheter if your prostate issues are resolved.  -RECENT SEPSIS DUE TO URINARY TRACT INFECTION (RESOLVED): Your recent sepsis, which was caused by a urinary tract infection, has resolved after your hospital stay and antibiotic treatment. We will monitor for any recurrence with a urinalysis and blood tests, including a blood count, metabolic panel, and magnesium levels, as recommended after your hospital discharge. Please watch for any signs of infection or sepsis recurrence.  -ACUTE KIDNEY INJURY (RESOLVED): Your acute kidney injury, which was caused by urinary retention and back pressure, has resolved after catheterization. We will continue to monitor your kidney function to ensure full recovery. Although there may be a slight loss of kidney function, it is not significant. We will order blood tests to assess your kidney function and ensure the injury has fully  resolved.  INSTRUCTIONS: Please follow up with your urologist, Dr. Ellen, for your catheter replacement on September 2nd. We will share your lab results with him and discuss the potential for catheter reversal and further management of your prostate issues. Additionally, monitor for any signs of infection or sepsis recurrence and report any concerns immediately.  Your Providers PCP: Katrinka Garnette KIDD, MD,  [phone]) Referring Provider: Katrinka Garnette KIDD, MD,  [phone]) Care Team Provider: Albertus Gordy HERO, MD,  [phone]) Care Team Provider: Matilda Garnette, MD,  [phone]) Care Team Provider: Gerard Arley DASEN, MD Care Team Provider: Skeet Juliene SAUNDERS, [place],  [phone]) Care Team Provider: Carolee Sherwood JONETTA DOUGLAS, MD,  [phone])  NEXT STEPS: [x]  Early Intervention: Schedule sooner appointment, call our on-call services, or go to emergency room if there is any significant Increase in pain or discomfort New or worsening symptoms Sudden or severe changes in your health [x]  Flexible Follow-Up: We recommend a No follow-ups on file. for optimal routine care. This allows for progress monitoring and treatment adjustments. [x]  Preventive Care: Schedule your annual preventive care visit! It's typically covered by insurance and helps identify potential health issues early. [x]  Lab & X-ray Appointments: Incomplete tests scheduled today, or call to schedule. X-rays: [facility] [place] (M-F, 8:30am-noon or 1pm-5pm). [x]  Medical Information Release: Sign a release form at front desk to obtain relevant medical information we don't have.  MAKING THE MOST OF OUR FOCUSED 20 MINUTE APPOINTMENTS: [x]   Clearly state your top concerns at the beginning of the visit to focus our discussion [x]   If you anticipate you will need more time, please inform the front desk during scheduling - we can book multiple appointments in the same week. [x]   If you have transportation  problems- use our  convenient video appointments or ask about transportation support. [x]   We can get down to business faster if you use MyChart to update information before the visit and submit non-urgent questions before your visit. Thank you for taking the time to provide details through MyChart.  Let our nurse know and she can import this information into your encounter documents.  Arrival and Wait Times: [x]   Arriving on time ensures that everyone receives prompt attention. [x]   Early morning (8a) and afternoon (1p) appointments tend to have shortest wait times. [x]   Unfortunately, we cannot delay appointments for late arrivals or hold slots during phone calls.  Getting Answers and Following Up [x]   Simple Questions & Concerns: For quick questions or basic follow-up after your visit, reach us  at [phone] or MyChart messaging. [x]   Complex Concerns: If your concern is more complex, scheduling an appointment might be best. Discuss this with the staff to find the most suitable option. [x]   Lab & Imaging Results: We'll contact you directly if results are abnormal or you don't use MyChart. Most normal results will be on MyChart within 2-3 business days, with a review message from Dr. Jesus. Haven't heard back in 2 weeks? Need results sooner? Contact us  at [phone]. [x]   Referrals: Our referral coordinator will manage specialist referrals. The specialist's office should contact you within 2 weeks to schedule an appointment. Call us  if you haven't heard from them after 2 weeks.  Staying Connected [x]   MyChart: Activate your MyChart for the fastest way to access results and message us . See the last page of this paperwork for instructions on how to activate.  Bring to Your Next Appointment [x]   Medications: Please bring all your medication bottles to your next appointment to ensure we have an accurate record of your prescriptions. [x]   Health Diaries: If you're monitoring any health conditions at home,  keeping a diary of your readings can be very helpful for discussions at your next appointment.  Billing [x]   X-ray & Lab Orders: These are billed by separate companies. Contact the invoicing company directly for questions or concerns. [x]   Visit Charges: Discuss any billing inquiries with our administrative services team.  Your Satisfaction Matters [x]   Share Your Experience: We strive for your satisfaction! If you have any complaints, or preferably compliments, please let Dr. Jesus know directly or contact our Practice Administrators, Manuelita Rubin or [employer], by asking at the front desk.   Reviewing Your Records [x]   Review this early draft of your clinical encounter notes below and the final encounter summary tomorrow on MyChart after its been completed.  All orders placed so far are visible here: Sepsis secondary to UTI [facility]) Assessment & Plan: Urinary retention due to benign prostatic hyperplasia with suprapubic catheter dependence and bladder spasms   Urinary retention is secondary to benign prostatic hyperplasia, managed with a suprapubic catheter. He reports occasional bladder spasms, likely due to catheter positioning or kinks. Recent prostate artery embolization failed to prevent urinary retention. Bladder overstretching is noted, with potential for future improvement. He is following up with a urologist, with catheter replacement scheduled for September 2nd. There is a discussion about potential catheter reversal if prostate issues are resolved. Order urinalysis to check for infection or early sepsis. Instruct him to check the catheter for kinks if experiencing bladder spasms. Share lab results with urologist, Dr. Ellen at [facility]. Discuss with the urologist about potential catheter reversal and further management of prostate issues.  Recent sepsis due to urinary tract infection (resolved)   Sepsis secondary to urinary tract infection has resolved after hospitalization  and antibiotic treatment. Monitor for recurrence with urinalysis and blood tests. Order blood count, metabolic panel, and magnesium as per hospital discharge recommendations. Monitor for signs of infection or sepsis recurrence.  Acute kidney injury (resolved)   Acute kidney injury secondary to urinary retention and back pressure has resolved post-catheterization. Monitor kidney function to ensure full recovery. There is a potential slight loss of kidney function, but it is not significant. Order blood tests to assess kidney function and ensure resolution of acute kidney injury.  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 He reports occasional bladder spasms, likely due to catheter positioning or kinks. Recent prostate artery embolization failed to prevent urinary retention. Bladder overstretching is noted, with potential for future improvement. He is following up with a urologist, with catheter replacement scheduled for September 2nd. There is a discussion about potential catheter reversal if prostate issues are resolved. Order urinalysis to check for infection or early sepsis. Instruct him to check the catheter for kinks if experiencing bladder spasms. Share lab results with urologist,  Dr. Ellen at [facility]. Discuss with the urologist about potential catheter reversal and further management of prostate issues.  Recent sepsis due to urinary tract infection (resolved)   Sepsis secondary to urinary tract infection has resolved after hospitalization and antibiotic treatment. Monitor for recurrence with urinalysis and blood tests. Order blood count, metabolic panel, and magnesium as per hospital discharge recommendations. Monitor for signs of infection or sepsis recurrence.  Acute kidney injury (resolved)   Acute kidney injury secondary to urinary retention and back pressure has resolved post-catheterization. Monitor kidney function to ensure full recovery. There is a potential slight loss of kidney function, but it is not significant. Order blood tests to assess kidney function and ensure resolution of acute kidney injury.

## 2023-10-14 NOTE — Assessment & Plan Note (Addendum)
 Reviewed [facility] correspondence from 10/07/23: He has a suprapubic catheter today after failing multiple voiding trials after PA E. He is doing much better with the suprapubic catheter. He will have his first suprapubic catheter exchange in the next 2 to 3 weeks with IR. He will follow up here in 8-12 weeks from today for office visit and S/ P exchange.  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 Recent prostate artery embolization failed to prevent urinary retention but over time may still resolve it. Per son report bladder overstretching was noted with loss of elasticity, with potential for future improvement. He is following up with a urologist, with catheter replacement scheduled for September 2nd. There is a discussion about potential catheter reversal if prostate issues are resolved. Order urinalysis to check for infection or early sepsis today. Instruct him to check the catheter for kinks if experiencing bladder spasms. Share lab results with urologist, Dr. Carolee at [facility] (added to epic care team). Discuss with the urologist about potential catheter reversal and further management of prostate issues.  Recent sepsis due to urinary tract infection (resolved)   Sepsis secondary to urinary tract infection has resolved after hospitalization and antibiotic treatment. Monitor for recurrence with urinalysis and blood tests. Order blood count, metabolic panel, and magnesium as per hospital discharge recommendations. Monitor for signs of infection or sepsis recurrence.  None present today.  Acute kidney injury (resolved)   Acute kidney injury secondary to urinary retention and back pressure has hopefully resolved post-catheterization. Monitor kidney function to ensure full recovery, . There is a potential slight loss of kidney  function, but it is not expected to be significant. Order blood tests to assess kidney function and ensure resolution of acute kidney injury.

## 2023-10-14 NOTE — Progress Notes (Signed)
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   Glucose, Bld 07/01/2023 85  70 - 99 mg/dL Final   BUN 95/71/7974 23  8 - 23 mg/dL Final   Creatinine, Ser 07/01/2023 1.65 (H)  0.61 - 1.24 mg/dL Final   Calcium  07/01/2023 8.3 (L)  8.9 - 10.3 mg/dL Final   GFR, Estimated 07/01/2023 43 (L)  >60 mL/min Final   Anion gap 07/01/2023 8  5 - 15 Final  Hospital Outpatient Visit on 06/26/2023  Component Date Value Ref Range Status   Glucose-Capillary 06/26/2023 106 (H)  70 - 99 mg/dL Final  Admission on 95/76/7974, Discharged on 06/26/2023  Component Date Value Ref Range Status   WBC 06/26/2023 9.5  4.0 - 10.5 K/uL Final   RBC 06/26/2023 4.25  4.22 - 5.81 MIL/uL Final   Hemoglobin 06/26/2023 12.4 (L)  13.0 - 17.0 g/dL Final   HCT  95/76/7974 37.7 (L)  39.0 - 52.0 % Final   MCV 06/26/2023 88.7  80.0 - 100.0 fL Final   MCH 06/26/2023 29.2  26.0 - 34.0 pg Final   MCHC 06/26/2023 32.9  30.0 - 36.0 g/dL Final   RDW 95/76/7974 16.3 (H)  11.5 - 15.5 % Final   Platelets 06/26/2023 387  150 - 400 K/uL Final   nRBC 06/26/2023 0.0  0.0 - 0.2 % Final   Prothrombin Time 06/26/2023 13.8  11.4 - 15.2 seconds Final   INR 06/26/2023 1.0  0.8 - 1.2 Final   Sodium 06/26/2023 138  135 - 145 mmol/L Final   Potassium 06/26/2023 4.6  3.5 - 5.1 mmol/L Final   Chloride 06/26/2023 107  98 - 111 mmol/L Final   CO2 06/26/2023 24  22 - 32 mmol/L Final   Glucose, Bld 06/26/2023 99  70 - 99 mg/dL Final   BUN 95/76/7974 18  8 - 23 mg/dL Final   Creatinine, Ser 06/26/2023 1.63 (H)  0.61 - 1.24 mg/dL Final   Calcium  06/26/2023 8.9  8.9 - 10.3 mg/dL Final   GFR, Estimated 06/26/2023 44 (L)  >60 mL/min Final   Anion gap 06/26/2023 7  5 - 15 Final  Office Visit on 03/18/2023  Component Date Value Ref Range Status   SARS Coronavirus 2 Ag 03/18/2023 Negative  Negative Final   Influenza A, POC 03/18/2023 Negative  Negative Final   Influenza B, POC 03/18/2023 Negative  Negative Final  Office Visit on 09/27/2022  Component Date Value Ref Range Status   Hgb A1c MFr Bld 09/27/2022 5.3  4.6 - 6.5 % Final   Sodium 09/27/2022 138  135 - 145 mEq/L Final   Potassium 09/27/2022 4.1  3.5 - 5.1 mEq/L Final   Chloride 09/27/2022 104  96 - 112 mEq/L Final   CO2 09/27/2022 27  19 - 32 mEq/L Final   Glucose, Bld 09/27/2022 90  70 - 99 mg/dL Final  BUN 09/27/2022 13  6 - 23 mg/dL Final   Creatinine, Ser 09/27/2022 1.51 (H)  0.40 - 1.50 mg/dL Final   Total Bilirubin 09/27/2022 0.5  0.2 - 1.2 mg/dL Final   Alkaline Phosphatase 09/27/2022 70  39 - 117 U/L Final   AST 09/27/2022 25  0 - 37 U/L Final   ALT 09/27/2022 16  0 - 53 U/L Final   Total Protein 09/27/2022 7.0  6.0 - 8.3 g/dL Final   Albumin 92/74/7975 3.9  3.5 - 5.2 g/dL Final   GFR 92/74/7975 45.59 (L)   >60.00 mL/min Final   Calcium  09/27/2022 8.9  8.4 - 10.5 mg/dL Final   WBC 92/74/7975 8.9  4.0 - 10.5 K/uL Final   RBC 09/27/2022 4.42  4.22 - 5.81 Mil/uL Final   Hemoglobin 09/27/2022 12.7 (L)  13.0 - 17.0 g/dL Final   HCT 92/74/7975 39.5  39.0 - 52.0 % Final   MCV 09/27/2022 89.3  78.0 - 100.0 fl Final   MCHC 09/27/2022 32.0  30.0 - 36.0 g/dL Final   RDW 92/74/7975 15.1  11.5 - 15.5 % Final   Platelets 09/27/2022 489.0 (H)  150.0 - 400.0 K/uL Final   Neutrophils Relative % 09/27/2022 56.7  43.0 - 77.0 % Final   Lymphocytes Relative 09/27/2022 25.1  12.0 - 46.0 % Final   Monocytes Relative 09/27/2022 11.0  3.0 - 12.0 % Final   Eosinophils Relative 09/27/2022 5.8 (H)  0.0 - 5.0 % Final   Basophils Relative 09/27/2022 1.4  0.0 - 3.0 % Final   Neutro Abs 09/27/2022 5.1  1.4 - 7.7 K/uL Final   Lymphs Abs 09/27/2022 2.2  0.7 - 4.0 K/uL Final   Monocytes Absolute 09/27/2022 1.0  0.1 - 1.0 K/uL Final   Eosinophils Absolute 09/27/2022 0.5  0.0 - 0.7 K/uL Final   Basophils Absolute 09/27/2022 0.1  0.0 - 0.1 K/uL Final   Cholesterol 09/27/2022 147  0 - 200 mg/dL Final   Triglycerides 92/74/7975 198.0 (H)  0.0 - 149.0 mg/dL Final   HDL 92/74/7975 36.30 (L)  >60.99 mg/dL Final   VLDL 92/74/7975 39.6  0.0 - 40.0 mg/dL Final   LDL Cholesterol 09/27/2022 71  0 - 99 mg/dL Final   Total CHOL/HDL Ratio 09/27/2022 4   Final   NonHDL 09/27/2022 110.25   Final  Admission on 08/04/2022, Discharged on 08/04/2022  Component Date Value Ref Range Status   WBC 08/04/2022 10.0  4.0 - 10.5 K/uL Final   RBC 08/04/2022 4.56  4.22 - 5.81 MIL/uL Final   Hemoglobin 08/04/2022 13.5  13.0 - 17.0 g/dL Final   HCT 93/98/7975 40.7  39.0 - 52.0 % Final   MCV 08/04/2022 89.3  80.0 - 100.0 fL Final   MCH 08/04/2022 29.6  26.0 - 34.0 pg Final   MCHC 08/04/2022 33.2  30.0 - 36.0 g/dL Final   RDW 93/98/7975 15.1  11.5 - 15.5 % Final   Platelets 08/04/2022 476 (H)  150 - 400 K/uL Final   nRBC 08/04/2022 0.0  0.0 - 0.2 %  Final   Neutrophils Relative % 08/04/2022 59  % Final   Neutro Abs 08/04/2022 5.9  1.7 - 7.7 K/uL Final   Lymphocytes Relative 08/04/2022 26  % Final   Lymphs Abs 08/04/2022 2.6  0.7 - 4.0 K/uL Final   Monocytes Relative 08/04/2022 9  % Final   Monocytes Absolute 08/04/2022 0.9  0.1 - 1.0 K/uL Final   Eosinophils Relative 08/04/2022 5  % Final  Eosinophils Absolute 08/04/2022 0.5  0.0 - 0.5 K/uL Final   Basophils Relative 08/04/2022 1  % Final   Basophils Absolute 08/04/2022 0.1  0.0 - 0.1 K/uL Final   Immature Granulocytes 08/04/2022 0  % Final   Abs Immature Granulocytes 08/04/2022 0.03  0.00 - 0.07 K/uL Final   Sodium 08/04/2022 135  135 - 145 mmol/L Final   Potassium 08/04/2022 4.1  3.5 - 5.1 mmol/L Final   Chloride 08/04/2022 103  98 - 111 mmol/L Final   CO2 08/04/2022 23  22 - 32 mmol/L Final   Glucose, Bld 08/04/2022 97  70 - 99 mg/dL Final   BUN 93/98/7975 21  8 - 23 mg/dL Final   Creatinine, Ser 08/04/2022 1.48 (H)  0.61 - 1.24 mg/dL Final   Calcium  08/04/2022 8.9  8.9 - 10.3 mg/dL Final   Total Protein 93/98/7975 7.6  6.5 - 8.1 g/dL Final   Albumin 93/98/7975 3.7  3.5 - 5.0 g/dL Final   AST 93/98/7975 24  15 - 41 U/L Final   ALT 08/04/2022 19  0 - 44 U/L Final   Alkaline Phosphatase 08/04/2022 72  38 - 126 U/L Final   Total Bilirubin 08/04/2022 0.7  0.3 - 1.2 mg/dL Final   GFR, Estimated 08/04/2022 50 (L)  >60 mL/min Final   Anion gap 08/04/2022 9  5 - 15 Final   Lipase 08/04/2022 60 (H)  11 - 51 U/L Final   Color, Urine 08/04/2022 YELLOW  YELLOW Final   APPearance 08/04/2022 CLEAR  CLEAR Final   Specific Gravity, Urine 08/04/2022 1.010  1.005 - 1.030 Final   pH 08/04/2022 5.5  5.0 - 8.0 Final   Glucose, UA 08/04/2022 NEGATIVE  NEGATIVE mg/dL Final   Hgb urine dipstick 08/04/2022 NEGATIVE  NEGATIVE Final   Bilirubin Urine 08/04/2022 NEGATIVE  NEGATIVE Final   Ketones, ur 08/04/2022 NEGATIVE  NEGATIVE mg/dL Final   Protein, ur 93/98/7975 NEGATIVE  NEGATIVE mg/dL Final    Nitrite 93/98/7975 NEGATIVE  NEGATIVE Final   Leukocytes,Ua 08/04/2022 SMALL (A)  NEGATIVE Final   RBC / HPF 08/04/2022 NONE SEEN  0 - 5 RBC/hpf Final   WBC, UA 08/04/2022 0-5  0 - 5 WBC/hpf Final   Bacteria, UA 08/04/2022 NONE SEEN  NONE SEEN Final   Squamous Epithelial / HPF 08/04/2022 0-5  0 - 5 /HPF Final  Admission on 04/24/2022, Discharged on 04/24/2022  Component Date Value Ref Range Status   SURGICAL PATHOLOGY 04/24/2022    Final-Edited                   Value:SURGICAL PATHOLOGY CASE: [id number] PATIENT: Carry Mancusi Surgical Pathology Report     Clinical History: Crohn's, family hx of colon cancer (crm)     FINAL MICROSCOPIC DIAGNOSIS:  A. ILEOCECAL, BIOPSY: - Severely active chronic ileitis, consistent with patient's clinical history of Crohn's disease - Negative for granulomas or dysplasia      GROSS DESCRIPTION:  Received in formalin are tan, soft tissue fragments that are submitted in toto. Number: 3.  Size: 0.2 to 0.5 cm.  Blocks: 1  SW 04/24/2022   Final Diagnosis performed by Katrine Muskrat, MD.   Electronically signed 04/25/2022 Technical and / or Professional components performed at [facility], [address], [place], [place] [address].  Immunohistochemistry Technical component (if applicable) was performed at [facility]. [address], [address], [place], [place] [address].   IMMUNOHISTOCHEMISTRY DISCLAIMER (if applicable): Some of these immunohisto  chemical stains may have been developed and the performance characteristics determine by [facility]. Some may not have been cleared or approved by the U.S. Food and Drug Administration. The FDA has determined that such clearance or approval is not necessary. This test is used for clinical purposes. It should not be regarded as investigational or for research. This laboratory is certified under the Clinical  Laboratory Improvement Amendments of 1988 (CLIA-88) as qualified to perform high complexity clinical laboratory testing.  The controls stained appropriately.   There may be more visits with results that are not included.  No image results found. DG Chest Port 1 View Result Date: 09/23/2023 CLINICAL DATA:  Chest pain EXAM: PORTABLE CHEST 1 VIEW COMPARISON:  None Available. FINDINGS: Normal mediastinum and cardiac silhouette. Normal pulmonary vasculature. No evidence of effusion, infiltrate, or pneumothorax. No acute bony abnormality. IMPRESSION: No acute cardiopulmonary process. Electronically Signed   By: Jackquline Boxer M.D.   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 Prostate artery embolization performed in April of 2025 was unsuccessful at relieving urinary retention. Therefore, patient presents for placement of a suprapubic catheter. EXAM: Placement of suprapubic catheter utilizing ultrasound and fluoroscopic guidance COMPARISON:  None Available. MEDICATIONS: None. ANESTHESIA/SEDATION: 100 mcg fentanyl  administered for pain control. This does not constitute moderate sedation. CONTRAST:  25 mL Omnipaque  300-administered into the collecting system(s) FLUOROSCOPY: Radiation Exposure Index (as provided by the fluoroscopic device): 135 mGy Kerma COMPLICATIONS: None immediate. PROCEDURE: Informed written consent was obtained from the patient after a thorough discussion of the procedural risks, benefits and alternatives. All questions were addressed. Maximal Sterile Barrier Technique was utilized including caps, mask, sterile gowns, sterile gloves, sterile drape, hand hygiene and skin antiseptic. A timeout was performed prior to the initiation of the procedure. Ultrasound was used to interrogate the bladder. Bladder is under distended due to the presence of the Foley  catheter despite clamping of the catheter. Therefore, sterile saline was instilled through the Foley catheter to distend bladder. A suitable skin entry site was selected and marked. Local anesthesia was attained by infiltration with 1% lidocaine . A small dermatotomy was made. Under real-time ultrasound guidance, an 18 gauge trocar needle was advanced through the anterior abdominal wall and into the bladder lumen. The 0.035 wire was then advanced into the bladder. A 7 x 100 Athletis balloon was placed coaxially through a 16 [place] tip Foley catheter. The angioplasty balloon and Foley catheter assembly was then advanced over the wire and the balloon positioned across the percutaneous tract. The balloon was inflated to full effacement. As the balloon was deflated, the balloon and Foley catheter assembly was advanced as a unit over the wire and into the bladder lumen. The balloon was deflated and an attempt was made at removal, however the balloon would not deflate properly and became stuck in the tip of the Foley catheter. Therefore, a road runner hydrophilic wire was advanced adjacent to the angioplasty catheter and through the side-hole of the Foley catheter into the bladder to maintain the tract. Next, the bladder and the stuck balloon were removed as a unit. The balloon was removed and disposed of. A Kumpe the catheter was advanced over the hydrophilic catheter into the bladder. Contrast was injected confirming that the catheter was in fact within the bladder lumen. The hydrophilic wire was then exchanged for a superstiff Amplatz wire. A new 7 x 100 Athletis balloon was  advanced to the Council tip Foley catheter and the procedure was repeated. This time, the balloon deflated properly and was easily removed leaving the Foley catheter within the bladder. The retention balloon was filled with 10 mL saline. Contrast was injected through the Foley catheter confirming its position within the urinary bladder.  IMPRESSION: Successful placement of a 16 [place] tip Foley catheter via suprapubic approach. Patient to return Interventional Radiology in 6-8 weeks for first catheter exchange over a wire. Electronically Signed   By: Wilkie Lent M.D.   On: 09/23/2023 13:06         ASSESSMENT & PLAN   Assessment & Plan Sepsis secondary to UTI [facility]) Suprapubic catheter (HCC) Acute kidney injury (AKI) with acute tubular necrosis (ATN) (HCC) Gross hematuria [facility] from 10/07/23: He has a suprapubic catheter today after failing multiple voiding trials after PA E. He is doing much better with the suprapubic catheter. He will have his first suprapubic catheter exchange in the next 2 to 3 weeks with IR. He will follow up here in 8-12 weeks from today for office visit and S/ P exchange.  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 Recent prostate artery embolization failed to prevent urinary retention but over time may still resolve it. Per son report bladder overstretching was noted with loss of elasticity, with potential for future improvement. He is following up with a urologist, with catheter replacement scheduled for September 2nd. There is a discussion about potential catheter reversal if prostate issues are resolved. Order urinalysis to check for infection or early sepsis today. Instruct him to check the catheter for kinks if experiencing bladder spasms. Share lab results with urologist, Dr. Carolee at [facility] (added to epic care team). Discuss with the urologist about potential catheter reversal and further management of prostate issues.  Recent sepsis due to urinary tract infection (resolved)   Sepsis secondary to urinary tract infection has resolved after hospitalization and antibiotic treatment. Monitor  for recurrence with urinalysis and blood tests. Order blood count, metabolic panel, and magnesium as per hospital discharge recommendations. Monitor for signs of infection or sepsis recurrence.  None present today.  Acute kidney injury (resolved)   Acute kidney injury secondary to urinary retention and back pressure has hopefully resolved post-catheterization. Monitor kidney function to ensure full recovery, . There is a potential slight loss of kidney function, but it is not expected to be significant. Order blood tests to assess kidney function and ensure resolution of acute kidney injury.   ORDER ASSOCIATIONS  #   DIAGNOSIS / CONDITION ICD-10 ENCOUNTER ORDER     ICD-10-CM   1. Sepsis secondary to UTI (HCC)  A41.9 CBC w/Diff   N39.0 Basic Metabolic Panel (BMET)    Magnesium    Urinalysis w microscopic + reflex cultur    2. Suprapubic catheter (HCC)  Z93.59 CBC w/Diff    Basic Metabolic Panel (BMET)    Magnesium    Urinalysis w microscopic + reflex cultur    3. Acute kidney injury (AKI) with acute tubular necrosis (ATN) (HCC)  N17.0 CBC w/Diff    Basic Metabolic Panel (BMET)    Magnesium    Urinalysis w microscopic + reflex cultur    4. Gross hematuria  R31.0      Orders Placed This Encounter  Procedures   CBC w/Diff   Basic Metabolic Panel (  BMET)   Magnesium   Urinalysis w microscopic + reflex cultur       This document was synthesized by artificial intelligence (Abridge) using HIPAA-compliant recording of the clinical interaction;   We discussed the use of AI scribe software for clinical note transcription with the patient, who gave verbal consent to proceed. additional Info: This encounter employed state-of-the-art, real-time, collaborative documentation. The patient actively reviewed and assisted in updating their electronic medical record on a shared screen, ensuring transparency and facilitating joint problem-solving for the problem list, overview, and plan. This approach  promotes accurate, informed care. The treatment plan was discussed and reviewed in detail, including medication safety, potential side effects, and all patient questions. We confirmed understanding and comfort with the plan. Follow-up instructions were established, including contacting the office for any concerns, returning if symptoms worsen, persist, or new symptoms develop, and precautions for potential emergency department visits.

## 2023-10-17 ENCOUNTER — Ambulatory Visit: Payer: Self-pay | Admitting: Internal Medicine

## 2023-10-17 DIAGNOSIS — N3000 Acute cystitis without hematuria: Secondary | ICD-10-CM

## 2023-10-17 LAB — URINALYSIS W MICROSCOPIC + REFLEX CULTURE
Bilirubin Urine: NEGATIVE
Glucose, UA: NEGATIVE
Ketones, ur: NEGATIVE
Nitrites, Initial: NEGATIVE
Specific Gravity, Urine: 1.011 (ref 1.001–1.035)
Squamous Epithelial / HPF: NONE SEEN /HPF (ref <=5)
WBC, UA: >=60 /HPF — AB (ref 0–5)
pH: 6.5 (ref 5.0–8.0)

## 2023-10-17 LAB — URINE CULTURE
MICRO NUMBER:: 16816786
SPECIMEN QUALITY:: ADEQUATE

## 2023-10-17 LAB — CULTURE INDICATED

## 2023-10-18 MED ORDER — CIPROFLOXACIN HCL 500 MG PO TABS: 500.0000 mg | ORAL_TABLET | Freq: Two times a day (BID) | ORAL | 0 refills | Status: AC

## 2023-10-22 ENCOUNTER — Other Ambulatory Visit: Payer: Self-pay | Admitting: Family Medicine

## 2023-11-01 ENCOUNTER — Other Ambulatory Visit: Payer: Self-pay | Admitting: Student
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
## 2023-11-05 ENCOUNTER — Other Ambulatory Visit: Payer: Self-pay

## 2023-11-05 ENCOUNTER — Ambulatory Visit (HOSPITAL_COMMUNITY)
Admission: RE | Admit: 2023-11-05 | Discharge: 2023-11-05 | Disposition: A | Discharge status: Home / Self Care | Source: Ambulatory Visit | Attending: Radiology | Admitting: Radiology

## 2023-11-05 DIAGNOSIS — R339 Retention of urine, unspecified: Secondary | ICD-10-CM | POA: Insufficient documentation

## 2023-11-05 DIAGNOSIS — Z435 Encounter for attention to cystostomy: Secondary | ICD-10-CM | POA: Diagnosis not present

## 2023-11-05 HISTORY — PX: IR CYSTOSTOMY TUBE CHANGE COMPLICATED W IMG: IMG1098

## 2023-11-05 LAB — GLUCOSE, CAPILLARY: Glucose-Capillary: 92 mg/dL (ref 70–99)

## 2023-11-05 MED ORDER — IOHEXOL 300 MG/ML  SOLN
50.0000 mL | Freq: Once | INTRAMUSCULAR | Status: AC | PRN
  Administered 2023-11-05: 10 mL

## 2023-11-05 MED ORDER — DIPHENHYDRAMINE HCL 50 MG/ML IJ SOLN
INTRAMUSCULAR | Status: AC
  Filled 2023-11-05: qty 1

## 2023-11-05 MED ORDER — SODIUM CHLORIDE 0.9% FLUSH: 5.0000 mL | Freq: Three times a day (TID) | INTRAVENOUS | Status: DC

## 2023-11-05 MED ORDER — SODIUM CHLORIDE 0.9 % IV SOLN: INTRAVENOUS | Status: DC

## 2023-11-05 MED ORDER — SODIUM CHLORIDE 0.9 % IV SOLN
INTRAVENOUS | Status: AC
  Filled 2023-11-05: qty 20

## 2023-11-05 MED ORDER — FENTANYL CITRATE (PF) 100 MCG/2ML IJ SOLN
INTRAMUSCULAR | Status: AC
  Filled 2023-11-05: qty 2

## 2023-11-05 MED ORDER — SODIUM CHLORIDE 0.9 % IV SOLN
2.0000 g | Freq: Once | INTRAVENOUS | Status: AC
  Administered 2023-11-05: 2 g via INTRAVENOUS

## 2023-11-05 MED ORDER — FENTANYL CITRATE (PF) 100 MCG/2ML IJ SOLN
INTRAMUSCULAR | Status: AC | PRN
  Administered 2023-11-05: 50 ug via INTRAVENOUS

## 2023-11-05 MED ORDER — LIDOCAINE VISCOUS HCL 2 % MT SOLN
OROMUCOSAL | Status: AC
  Filled 2023-11-05: qty 15

## 2023-11-05 MED ORDER — DIPHENHYDRAMINE HCL 50 MG/ML IJ SOLN
INTRAMUSCULAR | Status: AC | PRN
  Administered 2023-11-05 (×2): 25 mg via INTRAVENOUS

## 2023-11-05 NOTE — Progress Notes (Signed)
 Patient and daughter was given discharge instructions. Both verbalized understanding.

## 2023-11-05 NOTE — Procedures (Signed)
 Interventional Radiology Procedure Note  Procedure: Suprapubic catheter exchange  Findings: Please refer to procedural dictation for full description. 16 Fr council tip.  Complications:  None immediate  Estimated Blood Loss: < 5 ml  Recommendations: As per Urology.   Ester Sides, MD

## 2023-11-15 ENCOUNTER — Ambulatory Visit: Admitting: Family Medicine

## 2023-11-15 ENCOUNTER — Ambulatory Visit: Payer: Self-pay | Admitting: Family Medicine

## 2023-11-15 ENCOUNTER — Encounter: Payer: Self-pay | Admitting: Family Medicine

## 2023-11-15 VITALS — BP 128/82 | HR 80 | Temp 98.2°F | Ht 72.0 in | Wt 214.0 lb

## 2023-11-15 DIAGNOSIS — E118 Type 2 diabetes mellitus with unspecified complications: Secondary | ICD-10-CM

## 2023-11-15 DIAGNOSIS — I10 Essential (primary) hypertension: Secondary | ICD-10-CM

## 2023-11-15 DIAGNOSIS — Z23 Encounter for immunization: Secondary | ICD-10-CM | POA: Diagnosis not present

## 2023-11-15 DIAGNOSIS — E785 Hyperlipidemia, unspecified: Secondary | ICD-10-CM | POA: Diagnosis not present

## 2023-11-15 DIAGNOSIS — K625 Hemorrhage of anus and rectum: Secondary | ICD-10-CM

## 2023-11-15 DIAGNOSIS — K50012 Crohn's disease of small intestine with intestinal obstruction: Secondary | ICD-10-CM | POA: Diagnosis not present

## 2023-11-15 DIAGNOSIS — Z0000 Encounter for general adult medical examination without abnormal findings: Secondary | ICD-10-CM | POA: Diagnosis not present

## 2023-11-15 DIAGNOSIS — H9193 Unspecified hearing loss, bilateral: Secondary | ICD-10-CM

## 2023-11-15 DIAGNOSIS — D509 Iron deficiency anemia, unspecified: Secondary | ICD-10-CM

## 2023-11-15 DIAGNOSIS — S0990XS Unspecified injury of head, sequela: Secondary | ICD-10-CM

## 2023-11-15 DIAGNOSIS — E039 Hypothyroidism, unspecified: Secondary | ICD-10-CM

## 2023-11-15 DIAGNOSIS — D519 Vitamin B12 deficiency anemia, unspecified: Secondary | ICD-10-CM | POA: Diagnosis not present

## 2023-11-15 LAB — SEDIMENTATION RATE: Sed Rate: 35 mm/h — ABNORMAL HIGH (ref 0–20)

## 2023-11-15 LAB — CBC WITH DIFFERENTIAL/PLATELET
Basophils Absolute: 0.1 K/uL (ref 0.0–0.1)
Basophils Relative: 0.7 % (ref 0.0–3.0)
Eosinophils Absolute: 0.3 K/uL (ref 0.0–0.7)
Eosinophils Relative: 4.6 % (ref 0.0–5.0)
HCT: 38.3 % — ABNORMAL LOW (ref 39.0–52.0)
Hemoglobin: 12.7 g/dL — ABNORMAL LOW (ref 13.0–17.0)
Lymphocytes Relative: 27.5 % (ref 12.0–46.0)
Lymphs Abs: 2 K/uL (ref 0.7–4.0)
MCHC: 33.2 g/dL (ref 30.0–36.0)
MCV: 84.3 fl (ref 78.0–100.0)
Monocytes Absolute: 0.8 K/uL (ref 0.1–1.0)
Monocytes Relative: 11.6 % (ref 3.0–12.0)
Neutro Abs: 4 K/uL (ref 1.4–7.7)
Neutrophils Relative %: 55.6 % (ref 43.0–77.0)
Platelets: 438 K/uL — ABNORMAL HIGH (ref 150.0–400.0)
RBC: 4.55 Mil/uL (ref 4.22–5.81)
RDW: 15.3 % (ref 11.5–15.5)
WBC: 7.1 K/uL (ref 4.0–10.5)

## 2023-11-15 LAB — COMPREHENSIVE METABOLIC PANEL WITH GFR
ALT: 13 U/L (ref 0–53)
AST: 23 U/L (ref 0–37)
Albumin: 3.8 g/dL (ref 3.5–5.2)
Alkaline Phosphatase: 70 U/L (ref 39–117)
BUN: 10 mg/dL (ref 6–23)
CO2: 28 meq/L (ref 19–32)
Calcium: 9.2 mg/dL (ref 8.4–10.5)
Chloride: 105 meq/L (ref 96–112)
Creatinine, Ser: 1.46 mg/dL (ref 0.40–1.50)
GFR: 47.09 mL/min — ABNORMAL LOW (ref >60.00)
Glucose, Bld: 83 mg/dL (ref 70–99)
Potassium: 3.9 meq/L (ref 3.5–5.1)
Sodium: 140 meq/L (ref 135–145)
Total Bilirubin: 0.5 mg/dL (ref 0.2–1.2)
Total Protein: 6.7 g/dL (ref 6.0–8.3)

## 2023-11-15 LAB — IBC + FERRITIN
Ferritin: 11.3 ng/mL — ABNORMAL LOW (ref 22.0–322.0)
Iron: 54 ug/dL (ref 42–165)
Saturation Ratios: 14.3 % — ABNORMAL LOW (ref 20.0–50.0)
TIBC: 376.6 ug/dL (ref 250.0–450.0)
Transferrin: 269 mg/dL (ref 212.0–360.0)

## 2023-11-15 LAB — TSH: TSH: 0.41 u[IU]/mL (ref 0.35–5.50)

## 2023-11-15 LAB — MICROALBUMIN / CREATININE URINE RATIO
Creatinine,U: 196.7 mg/dL
Microalb Creat Ratio: 244.5 mg/g — ABNORMAL HIGH (ref 0.0–30.0)
Microalb, Ur: 48.1 mg/dL — ABNORMAL HIGH (ref 0.0–1.9)

## 2023-11-15 LAB — LIPID PANEL
Cholesterol: 147 mg/dL (ref 0–200)
HDL: 41.7 mg/dL (ref >39.00)
LDL Cholesterol: 79 mg/dL (ref 0–99)
NonHDL: 104.89
Total CHOL/HDL Ratio: 4
Triglycerides: 129 mg/dL (ref 0.0–149.0)
VLDL: 25.8 mg/dL (ref 0.0–40.0)

## 2023-11-15 LAB — C-REACTIVE PROTEIN: CRP: <1 mg/dL (ref 0.5–20.0)

## 2023-11-15 LAB — VITAMIN B12: Vitamin B-12: 272 pg/mL (ref 211–911)

## 2023-11-15 MED ORDER — COVID-19 MRNA VACC (MODERNA) 50 MCG/0.5ML IM SUSY: 0.5000 mL | PREFILLED_SYRINGE | Freq: Once | INTRAMUSCULAR | 0 refills | Status: AC

## 2023-11-15 NOTE — Patient Instructions (Addendum)
 team please log flu shot  We have placed a referral for you today to [facility] for hearing evaluation [phone])- please call their # if you do not hear within a wee  Please stop by lab before you go If you have mychart- we will send your results within 3 business days of us  receiving them.  If you do not have mychart- we will call you about results within 5 business days of us  receiving them.  *please also note that you will see labs on mychart as soon as they post. I will later go in and write notes on them- will say notes from Dr. Katrinka   Recommended follow up: Return in about 6 months (around 05/14/2024) for followup or sooner if needed.Schedule b4 you leave.
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
## 2023-11-20 LAB — HEMOGLOBIN A1C
Hgb A1c MFr Bld: 5.4 % (ref <5.7)
Mean Plasma Glucose: 108 mg/dL
eAG (mmol/L): 6 mmol/L

## 2023-11-20 LAB — LIPOPROTEIN A (LPA): Lipoprotein (a): 176 nmol/L — ABNORMAL HIGH (ref <75)

## 2023-11-26 DIAGNOSIS — R0609 Other forms of dyspnea: Secondary | ICD-10-CM | POA: Diagnosis not present

## 2023-11-26 DIAGNOSIS — E7800 Pure hypercholesterolemia, unspecified: Secondary | ICD-10-CM | POA: Diagnosis not present

## 2023-11-26 DIAGNOSIS — I10 Essential (primary) hypertension: Secondary | ICD-10-CM | POA: Diagnosis not present

## 2023-11-26 DIAGNOSIS — I428 Other cardiomyopathies: Secondary | ICD-10-CM | POA: Diagnosis not present

## 2023-11-26 DIAGNOSIS — I2510 Atherosclerotic heart disease of native coronary artery without angina pectoris: Secondary | ICD-10-CM | POA: Diagnosis not present

## 2023-12-04 DIAGNOSIS — S300XXA Contusion of lower back and pelvis, initial encounter: Secondary | ICD-10-CM | POA: Diagnosis not present

## 2023-12-04 DIAGNOSIS — M25551 Pain in right hip: Secondary | ICD-10-CM | POA: Diagnosis not present

## 2023-12-04 DIAGNOSIS — I10 Essential (primary) hypertension: Secondary | ICD-10-CM | POA: Diagnosis not present

## 2023-12-04 DIAGNOSIS — M1611 Unilateral primary osteoarthritis, right hip: Secondary | ICD-10-CM | POA: Diagnosis not present

## 2023-12-18 DIAGNOSIS — M79604 Pain in right leg: Secondary | ICD-10-CM | POA: Diagnosis not present

## 2023-12-18 DIAGNOSIS — M25551 Pain in right hip: Secondary | ICD-10-CM | POA: Diagnosis not present

## 2023-12-31 ENCOUNTER — Encounter: Payer: Self-pay | Admitting: Internal Medicine

## 2023-12-31 ENCOUNTER — Ambulatory Visit: Admitting: Internal Medicine

## 2023-12-31 VITALS — BP 120/78 | HR 80 | Ht 71.0 in | Wt 215.5 lb

## 2023-12-31 DIAGNOSIS — D500 Iron deficiency anemia secondary to blood loss (chronic): Secondary | ICD-10-CM | POA: Diagnosis not present

## 2023-12-31 DIAGNOSIS — K50018 Crohn's disease of small intestine with other complication: Secondary | ICD-10-CM | POA: Diagnosis not present

## 2023-12-31 DIAGNOSIS — K2940 Chronic atrophic gastritis without bleeding: Secondary | ICD-10-CM

## 2023-12-31 DIAGNOSIS — K648 Other hemorrhoids: Secondary | ICD-10-CM | POA: Diagnosis not present

## 2023-12-31 MED ORDER — BUDESONIDE 3 MG PO CPEP: 9.0000 mg | ORAL_CAPSULE | Freq: Every day | ORAL | 2 refills | Status: DC

## 2023-12-31 MED ORDER — HYDROCORTISONE ACETATE 25 MG RE SUPP: 25.0000 mg | Freq: Every day | RECTAL | 0 refills | Status: AC

## 2023-12-31 NOTE — Patient Instructions (Signed)
 Take your pantoprazole  daily.   We have sent the following medications to your pharmacy for you to pick up at your convenience: Entocort 3 capsules by mouth daily x 12 weeks and Anusol suppositories night x 5 nights.   Dr. Albertus wants you to follow up with him January. We do not have a schedule at this time. I will contact you when it becomes available.  _______________________________________________________  If your blood pressure at your visit was 140/90 or greater, please contact your primary care physician to follow up on this.  _______________________________________________________  If you are age 74 or older, your body mass index should be between 23-30. Your Body mass index is 30.06 kg/m. If this is out of the aforementioned range listed, please consider follow up with your Primary Care Provider.  If you are age 100 or younger, your body mass index should be between 19-25. Your Body mass index is 30.06 kg/m. If this is out of the aformentioned range listed, please consider follow up with your Primary Care Provider.   ________________________________________________________  The [facility] providers would like to encourage you to use MYCHART to communicate with providers for non-urgent requests or questions.  Due to long hold times on the telephone, sending your provider a message by [facility] may be a faster and more efficient way to get a response.  Please allow 48 business hours for a response.  Please remember that this is for non-urgent requests.  _______________________________________________________  [facility] is using a team-based approach to care.  Your team is made up of your doctor and two to three APPS. Our APPS (Nurse Practitioners and Physician Assistants) work with your physician to ensure care continuity for you. They are fully qualified to address your health concerns and develop a treatment plan. They communicate directly with your gastroenterologist to  care for you. Seeing the Advanced Practice Practitioners on your physician's team can help you by facilitating care more promptly, often allowing for earlier appointments, access to diagnostic testing, procedures, and other specialty referrals.

## 2023-12-31 NOTE — Progress Notes (Signed)
 Subjective:    Patient ID: Bruce [name], male    DOB: 1950-10-10, 73 y.o.   MRN: [id number]  HPI Bruce [name] is a 73 year old male with stricturing ileal Crohn's disease who presents with anal bleeding and abdominal pressure symptoms.  He has a history of ileal Crohn's disease with prior ileal resection and ileocolonic anastomosis performed in 2015. He presents with symptoms of anal bleeding and a sensation of needing to defecate but being unable to. The bleeding occurs every time he goes to the bathroom, with bright red blood on the tissue, but it stopped about two weeks ago. There is no associated pain with the bleeding. He also experiences a sensation of needing to defecate but being unable to, described as feeling like 'you got to go but you can't'.  A colonoscopy performed last year revealed an anal stricture and inflammation at the ileocolonic anastomosis, with moderate inflammation confined to the distal neoterminal ileum and anastomosis. Biopsies showed severely active chronic ileitis without dysplasia. A CT scan of the abdomen and pelvis on June 29, 2023, showed prostatomegaly, mild to moderate bladder distention, diffuse colonic dilation, and areas of mild proximal and distal sigmoid transition. He was admitted for sepsis in July related to a UTI after suprapubic catheter placement.  He keeps his bowels loose by drinking apple juice and other fluids. He experiences occasional lower abdominal pain and bloating, which sometimes causes discomfort. He takes iron supplements daily since his lab work showed low iron levels, with a ferritin of 11.3, TIBC of 377%, iron saturation of 14.3, and hemoglobin of 12.7. No nausea, vomiting, or upper abdominal pain.  His past medical history includes a normal esophagus and duodenum on EGD performed on October 19, 2021, with a 6 mm sessile polyp in the gastric antrum removed, showing hyperplastic features without metaplasia or dysplasia.  Biopsies showed chronic gastritis with atrophy, intestinal metaplasia, and micronodular cell hyperplasia consistent with chronic atrophic autoimmune gastritis. His last echocardiogram in January 2023 showed an ejection fraction of 50-55%.  He currently uses a suprapubic catheter to aid bladder drainage. He has been taking iron supplements for the past four to six weeks. He uses Miralax  as needed for bloating and maintains a diet low in coffee and high in juices to manage his symptoms.  He is now living with his daughter.  She is helping with a more balanced diet.  Review of Systems As per HPI, otherwise negative  Current Medications, Allergies, Past Medical History, Past Surgical History, Family History and Social History were reviewed in [employer] record.    Objective:   Physical Exam BP 120/78   Pulse 80   Ht 5' 11 (1.803 m)   Wt 215 lb 8 oz (97.8 kg)   BMI 30.06 kg/m  Gen: awake, alert, NAD HEENT: anicteric  Abd: soft, NT/ND, +BS throughout Ext: no c/c/e Neuro: nonfocal  LABS Ferritin: 11.3 (11/15/2023) TIBC: 377 (11/15/2023) Iron saturation: 14.3 (11/15/2023) Hemoglobin: 12.7 (11/15/2023) MCV: 84.3 (11/15/2023) WBC: 7.1 (11/15/2023) Platelet count: 438 (11/15/2023)  RADIOLOGY CT abdomen and pelvis: Prostatomegaly, mild to moderate bladder distention, diffuse colonic dilation, adynamic ileus, mild proximal and distal sigmoid transition, aortic valvular calcifications, fat-containing ventral abdominal wall hernia, left renal cyst, aortic atherosclerosis (06/29/2023)  DIAGNOSTIC Colonoscopy: Anal stricture, inflammation and stricturing at ileocolonic anastomosis, moderate localized inflammation at neoterminal ileum and anastomosis, scattered sigmoid diverticulosis, small internal hemorrhoids (04/24/2022) EGD: Normal esophagus, 6 mm sessile polyp in gastric antrum removed, scattered gastric  erythema, normal duodenum (10/19/2021) Echocardiogram: EF  50-55% (January 2023)  PATHOLOGY Biopsy: Severely active chronic ileitis without dysplasia (04/24/2022) Polyp biopsy: Hyperplastic polyp without metaplasia or dysplasia (10/19/2021) Antral and gastric body biopsies: Chronic gastritis with atrophy, intestinal metaplasia, micronodular cell hyperplasia consistent with chronic atrophic autoimmune gastritis (10/19/2021)      Assessment & Plan:   Ileal Crohn's disease with anastomotic stricture and active inflammation Active Crohn's disease at the ileocolonic anastomosis with moderate inflammation and stricture causing intermittent, mild obstructive symptoms. - Prescribe Entocort (budesonide ) 9 mg daily for 12 weeks. - Consider balloon dilation if symptoms persist post-medication; would recommend if done to do so in outpatient hospital setting - Discussed potential surgical intervention for long-term relief  Incomplete BMs and bloating Bowel and bloating likely related to ileal Crohn's disease with stricture. - Advised low fiber diet. - Use Miralax  as needed for harder stools.  Bleeding internal hemorrhoids/anal stenosis secondary to Crohn's Intermittent rectal bleeding due to internal hemorrhoids, this likely explains rectal bleeding which is painless in nature and now resolved.  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 - Prescribed Anusol suppositories at bedtime for 5 nights as needed.  Iron deficiency anemia Iron deficiency anemia secondary to chronic blood loss from active Crohn's disease and hemorrhoids. - Continue oral iron supplementation. -Close attention to ferritin and hemoglobin over time -Consider repeat endoscopic evaluation if progressive anemia or iron fails to correct with supplement  Atrophic gastritis with history of metaplasia without dysplasia -Consider EGD for surveillance, will discuss again at follow-up  Family history of colon cancer -Up-to-date with screening  30 minutes total spent today including patient facing  time, coordination of care, reviewing medical history/procedures/pertinent radiology studies, and documentation of the encounter.

## 2024-02-11 ENCOUNTER — Other Ambulatory Visit: Payer: Self-pay | Admitting: Family Medicine

## 2024-03-23 ENCOUNTER — Ambulatory Visit

## 2024-03-23 VITALS — Ht 71.0 in | Wt 215.0 lb

## 2024-03-23 DIAGNOSIS — Z0000 Encounter for general adult medical examination without abnormal findings: Secondary | ICD-10-CM
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
## 2024-03-23 NOTE — Patient Instructions (Signed)
 [name],  Thank you for taking the time for your Medicare Wellness Visit. I appreciate your continued commitment to your health goals. Please review the care plan we discussed, and feel free to reach out if I can assist you further.  Please note that Annual Wellness Visits do not include a physical exam. Some assessments may be limited, especially if the visit was conducted virtually. If needed, we may recommend an in-person follow-up with your provider.  Ongoing Care Seeing your primary care provider every 3 to 6 months helps us  monitor your health and provide consistent, personalized care.   Referrals If a referral was made during today's visit and you haven't received any updates within two weeks, please contact the referred provider directly to check on the status.  Recommended Screenings:  Health Maintenance  Topic Date Due   Zoster (Shingles) Vaccine (1 of 2) Never done   Eye exam for diabetics  03/02/2020   COVID-19 Vaccine (5 - 2025-26 season) 11/04/2023   Kidney health urinalysis for diabetes  05/14/2024   Hemoglobin A1C  05/14/2024   Yearly kidney function blood test for diabetes  11/14/2024   Complete foot exam   11/14/2024   Medicare Annual Wellness Visit  03/23/2025   Colon Cancer Screening  04/25/2027   DTaP/Tdap/Td vaccine (3 - Td or Tdap) 03/13/2030   Flu Shot  Completed   Hepatitis C Screening  Completed   Meningitis B Vaccine  Aged Out   Pneumococcal Vaccine for age over 26  Discontinued       03/23/2024    2:31 PM  Advanced Directives  Does Patient Have a Medical Advance Directive? Yes  Type of Advance Directive Healthcare Power of Attorney  Copy of Healthcare Power of Attorney in Chart? No - copy requested    Vision: Annual vision screenings are recommended for early detection of glaucoma, cataracts, and diabetic retinopathy. These exams can also reveal signs of chronic conditions such as diabetes and high blood pressure.  Dental: Annual dental  screenings help detect early signs of oral cancer, gum disease, and other conditions linked to overall health, including heart disease and diabetes.  Please see the attached documents for additional preventive care recommendations.

## 2024-04-01 ENCOUNTER — Ambulatory Visit: Admitting: Internal Medicine
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
## 2024-04-03 ENCOUNTER — Encounter: Payer: Self-pay | Admitting: Physician Assistant

## 2024-04-03 ENCOUNTER — Other Ambulatory Visit

## 2024-04-03 ENCOUNTER — Ambulatory Visit: Admitting: Physician Assistant

## 2024-04-03 VITALS — BP 128/78 | HR 75 | Ht 71.0 in | Wt 218.1 lb

## 2024-04-03 DIAGNOSIS — K50012 Crohn's disease of small intestine with intestinal obstruction: Secondary | ICD-10-CM

## 2024-04-03 DIAGNOSIS — K50018 Crohn's disease of small intestine with other complication: Secondary | ICD-10-CM

## 2024-04-03 DIAGNOSIS — D500 Iron deficiency anemia secondary to blood loss (chronic): Secondary | ICD-10-CM

## 2024-04-03 DIAGNOSIS — R1084 Generalized abdominal pain: Secondary | ICD-10-CM

## 2024-04-03 DIAGNOSIS — K2940 Chronic atrophic gastritis without bleeding: Secondary | ICD-10-CM | POA: Diagnosis not present

## 2024-04-03 DIAGNOSIS — K5900 Constipation, unspecified: Secondary | ICD-10-CM

## 2024-04-03 DIAGNOSIS — K648 Other hemorrhoids: Secondary | ICD-10-CM

## 2024-04-03 DIAGNOSIS — K56699 Other intestinal obstruction unspecified as to partial versus complete obstruction: Secondary | ICD-10-CM

## 2024-04-03 LAB — COMPREHENSIVE METABOLIC PANEL WITH GFR
ALT: 11 U/L (ref 3–53)
AST: 18 U/L (ref 5–37)
Albumin: 3.7 g/dL (ref 3.5–5.2)
Alkaline Phosphatase: 71 U/L (ref 39–117)
BUN: 12 mg/dL (ref 6–23)
CO2: 27 meq/L (ref 19–32)
Calcium: 9 mg/dL (ref 8.4–10.5)
Chloride: 104 meq/L (ref 96–112)
Creatinine, Ser: 1.49 mg/dL (ref 0.40–1.50)
GFR: 45.83 mL/min — ABNORMAL LOW
Glucose, Bld: 86 mg/dL (ref 70–99)
Potassium: 3.4 meq/L — ABNORMAL LOW (ref 3.5–5.1)
Sodium: 138 meq/L (ref 135–145)
Total Bilirubin: 0.5 mg/dL (ref 0.2–1.2)
Total Protein: 6.9 g/dL (ref 6.0–8.3)

## 2024-04-03 LAB — CBC WITH DIFFERENTIAL/PLATELET
Basophils Absolute: 0.1 10*3/uL (ref 0.0–0.1)
Basophils Relative: 0.8 % (ref 0.0–3.0)
Eosinophils Absolute: 0.3 10*3/uL (ref 0.0–0.7)
Eosinophils Relative: 3.2 % (ref 0.0–5.0)
HCT: 40.9 % (ref 39.0–52.0)
Hemoglobin: 13.4 g/dL (ref 13.0–17.0)
Lymphocytes Relative: 24 % (ref 12.0–46.0)
Lymphs Abs: 2 10*3/uL (ref 0.7–4.0)
MCHC: 32.8 g/dL (ref 30.0–36.0)
MCV: 86.1 fl (ref 78.0–100.0)
Monocytes Absolute: 0.9 10*3/uL (ref 0.1–1.0)
Monocytes Relative: 10.4 % (ref 3.0–12.0)
Neutro Abs: 5.2 10*3/uL (ref 1.4–7.7)
Neutrophils Relative %: 61.6 % (ref 43.0–77.0)
Platelets: 394 10*3/uL (ref 150.0–400.0)
RBC: 4.75 Mil/uL (ref 4.22–5.81)
RDW: 15.5 % (ref 11.5–15.5)
WBC: 8.4 10*3/uL (ref 4.0–10.5)

## 2024-04-03 LAB — C-REACTIVE PROTEIN: CRP: <0.5 mg/dL — ABNORMAL LOW (ref 1.0–20.0)

## 2024-04-03 MED ORDER — NA SULFATE-K SULFATE-MG SULF 17.5-3.13-1.6 GM/177ML PO SOLN: 1.0000 | Freq: Once | ORAL | 0 refills | Status: AC

## 2024-04-03 NOTE — Patient Instructions (Addendum)
 Your provider has requested that you go to the basement level for lab work before leaving today. Press B on the elevator. The lab is located at the first door on the left as you exit the elevator.  You have been scheduled for a colonoscopy. Please follow written instructions given to you at your visit today.   If you use inhalers (even only as needed), please bring them with you on the day of your procedure.  DO NOT TAKE 7 DAYS PRIOR TO TEST- Trulicity (dulaglutide) Ozempic, Wegovy (semaglutide) Mounjaro, Zepbound (tirzepatide) Bydureon Bcise (exanatide extended release)  DO NOT TAKE 1 DAY PRIOR TO YOUR TEST Rybelsus (semaglutide) Adlyxin (lixisenatide) Victoza (liraglutide) Byetta (exanatide) _________________________________________________________________________  _______________________________________________________  If your blood pressure at your visit was 140/90 or greater, please contact your primary care physician to follow up on this.  _______________________________________________________  If you are age 55 or older, your body mass index should be between 23-30. Your Body mass index is 30.42 kg/m. If this is out of the aforementioned range listed, please consider follow up with your Primary Care Provider.  If you are age 64 or younger, your body mass index should be between 19-25. Your Body mass index is 30.42 kg/m. If this is out of the aformentioned range listed, please consider follow up with your Primary Care Provider.   ________________________________________________________  The [facility] providers would like to encourage you to use MYCHART to communicate with providers for non-urgent requests or questions.  Due to long hold times on the telephone, sending your provider a message by [facility] may be a faster and more efficient way to get a response.  Please allow 48 business hours for a response.  Please remember that this is for non-urgent requests.   _______________________________________________________  [facility] is using a team-based approach to care.  Your team is made up of your doctor and two to three APPS. Our APPS (Nurse Practitioners and Physician Assistants) work with your physician to ensure care continuity for you. They are fully qualified to address your health concerns and develop a treatment plan. They communicate directly with your gastroenterologist to care for you. Seeing the Advanced Practice Practitioners on your physician's team can help you by facilitating care more promptly, often allowing for earlier appointments, access to diagnostic testing, procedures, and other specialty referrals.

## 2024-04-04 LAB — IRON,TIBC AND FERRITIN PANEL
%SAT: 27 % (ref 20–48)
Ferritin: 17 ng/mL — ABNORMAL LOW (ref 24–380)
Iron: 102 ug/dL (ref 50–180)
TIBC: 375 ug/dL (ref 250–425)

## 2024-04-06 ENCOUNTER — Ambulatory Visit: Payer: Self-pay | Admitting: Physician Assistant

## 2024-04-06 DIAGNOSIS — D509 Iron deficiency anemia, unspecified: Secondary | ICD-10-CM

## 2024-04-06 DIAGNOSIS — E876 Hypokalemia: Secondary | ICD-10-CM

## 2024-05-14 ENCOUNTER — Ambulatory Visit: Admitting: Family Medicine

## 2024-05-28 ENCOUNTER — Ambulatory Visit (HOSPITAL_COMMUNITY): Admit: 2024-05-28 | Admitting: Internal Medicine

## 2024-05-28 ENCOUNTER — Encounter (HOSPITAL_COMMUNITY): Payer: Self-pay

## 2025-03-25 ENCOUNTER — Ambulatory Visit
